# Patient Record
Sex: Male | Born: 1960 | Race: White | Hispanic: No | Marital: Single | Smoking: Never smoker
Health system: Southern US, Community
[De-identification: ages and names within clinical notes are randomized; demographics above are authoritative.]

## PROBLEM LIST (undated history)

## (undated) DIAGNOSIS — H269 Unspecified cataract: Secondary | ICD-10-CM

## (undated) DIAGNOSIS — F209 Schizophrenia, unspecified: Secondary | ICD-10-CM

## (undated) DIAGNOSIS — K760 Fatty (change of) liver, not elsewhere classified: Secondary | ICD-10-CM

## (undated) DIAGNOSIS — D518 Other vitamin B12 deficiency anemias: Secondary | ICD-10-CM

## (undated) DIAGNOSIS — E785 Hyperlipidemia, unspecified: Secondary | ICD-10-CM

## (undated) DIAGNOSIS — I10 Essential (primary) hypertension: Secondary | ICD-10-CM

## (undated) DIAGNOSIS — G20 Parkinson's disease: Secondary | ICD-10-CM

## (undated) DIAGNOSIS — K635 Polyp of colon: Secondary | ICD-10-CM

## (undated) DIAGNOSIS — G20A1 Parkinson's disease without dyskinesia, without mention of fluctuations: Secondary | ICD-10-CM

## (undated) HISTORY — DX: Hyperlipidemia, unspecified: E78.5

## (undated) HISTORY — DX: Schizophrenia, unspecified: F20.9

## (undated) HISTORY — DX: Fatty (change of) liver, not elsewhere classified: K76.0

## (undated) HISTORY — DX: Essential (primary) hypertension: I10

## (undated) HISTORY — PX: UPPER GASTROINTESTINAL ENDOSCOPY: SHX188

## (undated) HISTORY — DX: Parkinson's disease: G20

## (undated) HISTORY — DX: Polyp of colon: K63.5

## (undated) HISTORY — DX: Other vitamin B12 deficiency anemias: D51.8

## (undated) HISTORY — DX: Parkinson's disease without dyskinesia, without mention of fluctuations: G20.A1

## (undated) HISTORY — DX: Unspecified cataract: H26.9

---
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
## 2000-12-22 ENCOUNTER — Encounter: Payer: Self-pay | Admitting: Family Medicine

## 2000-12-22 ENCOUNTER — Ambulatory Visit (HOSPITAL_COMMUNITY): Admission: RE | Admit: 2000-12-22 | Discharge: 2000-12-22 | Payer: Self-pay | Admitting: Family Medicine

## 2008-01-21 LAB — HM COLONOSCOPY

## 2008-02-13 HISTORY — PX: COLONOSCOPY: SHX174

## 2009-05-16 LAB — FECAL OCCULT BLOOD, GUAIAC: Fecal Occult Blood: NEGATIVE

## 2010-05-04 LAB — CBC AND DIFFERENTIAL: Hemoglobin: 16 g/dL (ref 13.5–17.5)

## 2010-06-16 ENCOUNTER — Ambulatory Visit (INDEPENDENT_AMBULATORY_CARE_PROVIDER_SITE_OTHER): Payer: No Typology Code available for payment source | Admitting: Internal Medicine

## 2010-06-16 DIAGNOSIS — R1310 Dysphagia, unspecified: Secondary | ICD-10-CM

## 2010-06-28 NOTE — Consult Note (Signed)
NAME:  Mark Benjamin, Mark Benjamin                 ACCOUNT NO.:  [id number]  MEDICAL RECORD NO.:  [id number]           PATIENT TYPE:  LOCATION:  Office                              FACILITY:  PHYSICIAN:  Lionel December, M.D.    DATE OF BIRTH:  06/02/1960  DATE OF CONSULTATION: DATE OF DISCHARGE:                                CONSULTATION   REASON FOR CONSULTATION:  Dysphagia.  HISTORY OF PRESENT ILLNESS:  Mark Benjamin is a 50 year old male referred to our office by Dr. Christell Constant for dysphagia.  Mark Benjamin states foods are lodging.  Mark Benjamin has to drink water for the bolus to go down.  Mark Benjamin says Mark Benjamin has symptoms 2-3 times a month, which has been occurring for about a year.  Mark Benjamin may in particular will lodge.  Mark Benjamin does say Mark Benjamin does better when Mark Benjamin eats small bites of food and chews well.  His appetite has remained good.  There has been no weight loss.  No abdominal pain. Mark Benjamin usually has one bowel movement a day and sometimes Mark Benjamin will skip a day.  His stools are brown and normal caliber.  Mark Benjamin denies any acid reflux.  Mark Benjamin has no trouble swallowing pills.  Mark Benjamin did undergo EGD/ED in 2004 by Dr. Kinnie Scales in [place] for dysphagia.  His last colonoscopy was in 2009 by Dr. Loreta Ave, and the polyp was removed.  Mark Benjamin was advised by Dr. Karilyn Cota to have a followup colonoscopy in 2014.  I did not receive the pathology report from [facility].  There are no known allergies.  SURGERY:  Mark Benjamin had questionable urological procedure as a child.  MEDICAL HISTORY:  Diabetes since 2006.  Mark Benjamin is a schizophrenic, and a history of hypertension.  FAMILY HISTORY:  His mother is alive in good health.  His father is deceased from ALF.  One sister in good health.  Mark Benjamin has one brother deceased, unknown reasons why Mark Benjamin deceased at age 69.  Mark Benjamin is single.  Mark Benjamin is disabled.  Mark Benjamin does not smoke, drink, or do drugs, and Mark Benjamin does not have any children.  HOME MEDICATIONS: 1. Thioridazine 100 mg 3 at night. 2. Crestor 20 mg a day. 3. Niaspan 1000 mg a day. 4.  Actos 15 mg 1/2 tablet daily. 5. Fosinopril/hydrochlorothiazide 10/12.5 a day. 6. Lovaza 1 g cap twice a day. 7. Vitamin D 1000 units a day. 8. Colace 1 a day.  OBJECTIVE:  VITAL SIGNS:  His weight is 223.7, his height is 6 feet 1 inches, temperature is 97, blood pressure is 122/70, his pulse is 84. HEENT:  Mark Benjamin has natural teeth in good condition.  His oral mucosa is moist.  There are no lesions.  His conjunctivae are pink.  His sclerae are anicteric. NECK:  His thyroid is normal.  There is no cervical lymphadenopathy. LUNGS:  Clear. ABDOMEN:  Soft.  Bowel sounds are positive.  No masses. EXTREMITIES:  There is no edema to his extremities.  ASSESSMENT:  Mark Benjamin is a 50 year old male presenting today with solid food dysphagia, which has been occurring for about a year.  Mark Benjamin does have a history of solid food dysphagia  and has undergone an EGD/ED in  2004. An esophageal stricturing or web needs to be ruled out.  RECOMMENDATIONS:  We will schedule an EGD/ED with Dr. Karilyn Cota in the near future, and the risk and benefits were reviewed with the patient, and Mark Benjamin is agreeable to proceed.    ______________________________ Dorene Ar, NP   ______________________________ Lionel December, M.D.    TS/MEDQ  D:  06/16/2010  T:  06/16/2010  Job:  [id number]  cc:   Ernestina Penna, M.D. Fax: [phone]  Electronically Signed by Dorene Ar PA on 06/25/2010 12:17:47 PM Electronically Signed by Lionel December M.D. on 06/28/2010 01:59:19 PM

## 2010-06-29 ENCOUNTER — Encounter: Payer: Self-pay | Admitting: Family Medicine

## 2010-07-27 ENCOUNTER — Encounter (INDEPENDENT_AMBULATORY_CARE_PROVIDER_SITE_OTHER): Payer: No Typology Code available for payment source | Admitting: Internal Medicine

## 2010-07-27 ENCOUNTER — Ambulatory Visit (HOSPITAL_COMMUNITY)
Admission: RE | Admit: 2010-07-27 | Payer: No Typology Code available for payment source | Source: Ambulatory Visit | Admitting: Internal Medicine

## 2010-09-09 ENCOUNTER — Encounter (HOSPITAL_BASED_OUTPATIENT_CLINIC_OR_DEPARTMENT_OTHER): Payer: No Typology Code available for payment source | Admitting: Internal Medicine

## 2010-09-09 ENCOUNTER — Other Ambulatory Visit (INDEPENDENT_AMBULATORY_CARE_PROVIDER_SITE_OTHER): Payer: Self-pay | Admitting: Internal Medicine

## 2010-09-09 ENCOUNTER — Ambulatory Visit (HOSPITAL_COMMUNITY)
Admission: RE | Admit: 2010-09-09 | Discharge: 2010-09-09 | Disposition: A | Discharge status: Home / Self Care | Payer: No Typology Code available for payment source | Source: Ambulatory Visit | Attending: Internal Medicine | Admitting: Internal Medicine

## 2010-09-09 DIAGNOSIS — R1310 Dysphagia, unspecified: Secondary | ICD-10-CM | POA: Insufficient documentation

## 2010-09-09 DIAGNOSIS — K319 Disease of stomach and duodenum, unspecified: Secondary | ICD-10-CM

## 2010-09-09 DIAGNOSIS — D130 Benign neoplasm of esophagus: Secondary | ICD-10-CM | POA: Insufficient documentation

## 2010-09-09 DIAGNOSIS — E785 Hyperlipidemia, unspecified: Secondary | ICD-10-CM | POA: Insufficient documentation

## 2010-09-09 DIAGNOSIS — E119 Type 2 diabetes mellitus without complications: Secondary | ICD-10-CM | POA: Insufficient documentation

## 2010-09-09 DIAGNOSIS — K200 Eosinophilic esophagitis: Secondary | ICD-10-CM | POA: Insufficient documentation

## 2010-09-09 DIAGNOSIS — D131 Benign neoplasm of stomach: Secondary | ICD-10-CM

## 2010-09-09 LAB — GLUCOSE, CAPILLARY: Glucose-Capillary: 120 mg/dL — ABNORMAL HIGH (ref 70–99)

## 2010-09-10 LAB — BASIC METABOLIC PANEL
Potassium: 4.5 mmol/L (ref 3.4–5.3)
Sodium: 140 mmol/L (ref 137–147)

## 2010-09-10 LAB — HEPATIC FUNCTION PANEL
AST: 25 U/L (ref 14–40)
Bilirubin, Total: 0.9 mg/dL

## 2010-09-29 NOTE — Op Note (Signed)
NAME:  Mark Benjamin, Mark Benjamin                 ACCOUNT NO.:  [id number]  MEDICAL RECORD NO.:  [id number]  LOCATION:  [facility]                          FACILITY:  [facility]  PHYSICIAN:  Lionel December, M.D.    DATE OF BIRTH:  December 02, 1960  DATE OF PROCEDURE: DATE OF DISCHARGE:                              OPERATIVE REPORT   PROCEDURE:  Esophagogastroduodenoscopy with esophageal dilation.  INDICATION:  Mark Benjamin is 50 year old Caucasian male who presents with 1- year history of dysphagia to solids.  He points to midsternal area site of bolus obstruction.  He states he had esophagus dilated in [place] about 8 years ago, but does not remember the details of.  However, he did not have any problem until 1 year ago.  He denies heartburn.  He also denies odynophagia, melena, abdominal pain, anorexia, weight loss. Procedures were reviewed with the patient.  Informed consent was obtained.  MEDS FOR CONSCIOUS SEDATION:  Cetacaine spray for oropharyngeal topicalanesthesia, Demerol 50 mg IV, Versed 8 mg IV.  FINDINGS:  Procedure performed in endoscopy suite.  The patient's vital signs and O2 sat were monitored during the procedure and remained stable.  The patient was placed in left lateral recumbent position and Pentax videoscope was passed via oropharynx without any difficulty into esophagus.  On the way in, oropharyngeal mucosa was noted to be quite dry.  Esophagus.  Esophageal mucosa revealed few small plaques and linear furrows and a coarse appearance to mucosa and body.  These changes were very suspicious for eosinophilic esophagitis.  GE junction was located at 40 cm from the incisors and there was no obvious narrowing ring or stricture.  This area was dilated on the way out with the balloon dilator from 15 mm to 16.5 and 18 mm, but no mucosal disruption was induced.  On the way out, esophageal biopsy was taken looking for eosinophilic esophagitis.  Stomach.  It was empty and distended very well by  insufflation.  Folds of proximal stomach are normal.  Examination of mucosa revealed few punctate telangiectasia antrum without stigmata of bleeding.  Pyloric channel was patent.  Angularis, fundus, and cardia were examined by retroflexion of scope and were normal except 4-mm hyperplastic-appearing polyp at fundus which was left alone.  Duodenum.  Bulbar and postbulbar mucosa was normal.  GE junction was dilated with a balloon as described above.  Multiple biopsies were taken for esophageal body before the endoscope was withdrawn.  The patient tolerated the procedure well.  FINAL DIAGNOSES: 1. Esophageal mucosal changes suspicious for eosinophilic esophagitis.     No obvious stricture or ring noted.  GE junction and distal     esophagus dilated with balloon up to 18 mm. 2. A few punctate telangiectasia at gastric antrum and 4-mm     hyperplastic-appearing polyp at gastric fundus, which was left     alone.  Esophageal biopsy was taken on the way out.  RECOMMENDATIONS:  The patient will resume his usual meds and diet.  He must chew his food thoroughly before he tend to swallow it.  I will be contacting patient and/or his mother with the biopsy results and further recommendations.  ______________________________ Lionel December, M.D.     NR/MEDQ  D:  09/09/2010  T:  09/10/2010  Job:  [id number]  cc:   Ernestina Penna, M.D. Fax: [phone]  Electronically Signed by Lionel December M.D. on 09/29/2010 08:14:55 PM

## 2010-11-12 ENCOUNTER — Encounter (INDEPENDENT_AMBULATORY_CARE_PROVIDER_SITE_OTHER): Payer: Self-pay

## 2010-11-30 ENCOUNTER — Ambulatory Visit (INDEPENDENT_AMBULATORY_CARE_PROVIDER_SITE_OTHER): Payer: No Typology Code available for payment source | Admitting: Internal Medicine

## 2010-11-30 VITALS — BP 112/70 | HR 80 | Temp 97.0°F | Ht 73.0 in | Wt 220.4 lb

## 2010-11-30 DIAGNOSIS — R1310 Dysphagia, unspecified: Secondary | ICD-10-CM

## 2010-11-30 DIAGNOSIS — R1314 Dysphagia, pharyngoesophageal phase: Secondary | ICD-10-CM

## 2010-11-30 NOTE — Progress Notes (Signed)
Subjective:     Patient ID: Mark Benjamin, male   DOB: 07/05/1960, 50 y.o.   MRN: [id number]  HPI Mark Benjamin is here for f/u.  He says his swallowing is okay.  No dysphagia.  He is eating what he wants to  His appetite is good.  He does tell me sometimes foods are slow to go down.  No acid reflux. BMs are normal.   He denies weight loss.  He can eat hamburger without difficulty He was last seen in our office in March with c/o of dysphagia. He underwent an EGD in June which revealed eosinophilic esophagitis.   He did not get the Fluticasone filled due to the expense.He did not let anyone know this. Review of Systems Current Outpatient Prescriptions  Medication Sig Dispense Refill  . Cholecalciferol (VITAMIN D3) 1000 UNITS CAPS Take 1 tablet by mouth daily.        . docusate sodium (COLACE) 100 MG capsule Take 100 mg by mouth 2 (two) times daily.        . fosinopril-hydrochlorothiazide (MONOPRIL-HCT) 10-12.5 MG per tablet Take 1 tablet by mouth daily.        . geriatric multivitamins-minerals (ELDERTONIC/GEVRABON) ELIX Take 15 mLs by mouth daily.        . niacin (NIASPAN) 1000 MG CR tablet Take 1,000 mg by mouth at bedtime.        . omega-3 acid ethyl esters (LOVAZA) 1 G capsule Take 2 g by mouth 2 (two) times daily.        . Omega-3 Fatty Acids (FISH OIL) 1200 MG CAPS Take 3 capsules by mouth daily.        . pioglitazone (ACTOS) 30 MG tablet Take 30 mg by mouth daily.        . rosuvastatin (CRESTOR) 20 MG tablet Take 20 mg by mouth daily.        . thioridazine (MELLARIL) 100 MG tablet Take 100 mg by mouth. 3 tabs qhs            Past Surgical History  Procedure Date  . Upper gastrointestinal endoscopy EGD ED    09/09/2010  . Colonoscopy 02/13/08   Past Medical History  Diagnosis Date  . Essential hypertension, benign   . Other and unspecified hyperlipidemia   . 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 Schizophrenia   . Hyperplasia of prostate   . Fatty liver     . Colon polyps   . Dysphagia    History   Social History Narrative  . No narrative on file   History   Social History  . Marital Status: Married    Spouse Name: N/A    Number of Children: N/A  . Years of Education: N/A   Occupational History  . Not on file.   Social History Main Topics  . Smoking status: Not on file  . Smokeless tobacco: Not on file  . Alcohol Use:   . Drug Use:   . Sexually Active:    Other Topics Concern  . Not on file   Social History Narrative  . No narrative on file  No family history on file. No family status information on file.     Objective:   Physical Exam    Filed Vitals:   11/30/10 1027  BP: 112/70  Pulse: 80  Temp: 97 F (36.1 C)   Filed Vitals:   11/30/10 1027  Height: 6' 1" (1.854 m)  Weight: 220 lb   6.4 oz (99.973 kg)    Alert and oriented. Skin warm and dry. Oral mucosa is moist. Natural teeth in good condition. Sclera anicteric, conjunctivae is pink. Thyroid not enlarged. No cervical lymphadenopathy. Lungs clear. Heart regular rate and rhythm.  Abdomen is soft. Bowel sounds are positive. No hepatomegaly. No abdominal masses felt. No tenderness.  No edema to lower extremities. Patient is alert and oriented.      Assessment:    Eosinophilic esophagitis     Plan:     I will discuss with Dr. Rehman other treatment options since he cannot afford this medications.  He will follow up in 6 monthhs     

## 2010-11-30 NOTE — Progress Notes (Signed)
Subjective:     Patient ID: Mark Benjamin, male   DOB: 10/02/1960, 50 y.o.   MRN: [id number]  HPI Mark Benjamin is here for f/u.  He says his swallowing is okay.  No dysphagia.  He is eating what he wants to  His appetite is good.  He does tell me sometimes foods are slow to go down.  No acid reflux. BMs are normal.   He denies weight loss.  He can eat hamburger without difficulty He was last seen in our office in March with c/o of dysphagia. He underwent an EGD in June which revealed eosinophilic esophagitis.   He did not get the Fluticasone filled due to the expense.He did not let anyone know this. Review of Systems Current Outpatient Prescriptions  Medication Sig Dispense Refill  . Cholecalciferol (VITAMIN D3) 1000 UNITS CAPS Take 1 tablet by mouth daily.        . docusate sodium (COLACE) 100 MG capsule Take 100 mg by mouth 2 (two) times daily.        . fosinopril-hydrochlorothiazide (MONOPRIL-HCT) 10-12.5 MG per tablet Take 1 tablet by mouth daily.        . geriatric multivitamins-minerals (ELDERTONIC/GEVRABON) ELIX Take 15 mLs by mouth daily.        . niacin (NIASPAN) 1000 MG CR tablet Take 1,000 mg by mouth at bedtime.        . omega-3 acid ethyl esters (LOVAZA) 1 G capsule Take 2 g by mouth 2 (two) times daily.        . Omega-3 Fatty Acids (FISH OIL) 1200 MG CAPS Take 3 capsules by mouth daily.        . pioglitazone (ACTOS) 30 MG tablet Take 30 mg by mouth daily.        . rosuvastatin (CRESTOR) 20 MG tablet Take 20 mg by mouth daily.        . thioridazine (MELLARIL) 100 MG tablet Take 100 mg by mouth. 3 tabs qhs            Past Surgical History  Procedure Date  . Upper gastrointestinal endoscopy EGD ED    09/09/2010  . Colonoscopy 02/13/08   Past Medical History  Diagnosis Date  . Essential hypertension, benign   . Other and unspecified hyperlipidemia   . 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 Schizophrenia   . Hyperplasia of prostate   . Fatty liver     . Colon polyps   . Dysphagia    History   Social History Narrative  . No narrative on file   History   Social History  . Marital Status: Married    Spouse Name: N/A    Number of Children: N/A  . Years of Education: N/A   Occupational History  . Not on file.   Social History Main Topics  . Smoking status: Not on file  . Smokeless tobacco: Not on file  . Alcohol Use:   . Drug Use:   . Sexually Active:    Other Topics Concern  . Not on file   Social History Narrative  . No narrative on file  No family history on file. No family status information on file.     Objective:   Physical Exam    Filed Vitals:   11/30/10 1027  BP: 112/70  Pulse: 80  Temp: 97 F (36.1 C)   Filed Vitals:   11/30/10 1027  Height: 6' 1" (1.854 m)  Weight: 220 lb   6.4 oz (99.973 kg)    Alert and oriented. Skin warm and dry. Oral mucosa is moist. Natural teeth in good condition. Sclera anicteric, conjunctivae is pink. Thyroid not enlarged. No cervical lymphadenopathy. Lungs clear. Heart regular rate and rhythm.  Abdomen is soft. Bowel sounds are positive. No hepatomegaly. No abdominal masses felt. No tenderness.  No edema to lower extremities. Patient is alert and oriented.      Assessment:    Eosinophilic esophagitis     Plan:     I will discuss with Dr. Rehman other treatment options since he cannot afford this medications.  He will follow up in 6 monthhs     

## 2010-11-30 NOTE — Progress Notes (Signed)
Subjective:     Patient ID: Mark Benjamin, male   DOB: 08-11-1960, 50 y.o.   MRN: [id number]  HPI Mark Benjamin is here for f/u.  He says his swallowing is okay.  No dysphagia.  He is eating what he wants to  His appetite is good.  He does tell me sometimes foods are slow to go down.  No acid reflux. BMs are normal.   He denies weight loss.  He can eat hamburger without difficulty He was last seen in our office in March with c/o of dysphagia. He underwent an EGD in June which revealed eosinophilic esophagitis.   He did not get the Fluticasone filled due to the expense.He did not let anyone know this. Review of Systems Current Outpatient Prescriptions  Medication Sig Dispense Refill  . Cholecalciferol (VITAMIN D3) 1000 UNITS CAPS Take 1 tablet by mouth daily.        Marland Kitchen docusate sodium (COLACE) 100 MG capsule Take 100 mg by mouth 2 (two) times daily.        . fosinopril-hydrochlorothiazide (MONOPRIL-HCT) 10-12.5 MG per tablet Take 1 tablet by mouth daily.        Marland Kitchen geriatric multivitamins-minerals (ELDERTONIC/GEVRABON) ELIX Take 15 mLs by mouth daily.        . niacin (NIASPAN) 1000 MG CR tablet Take 1,000 mg by mouth at bedtime.        Marland Kitchen omega-3 acid ethyl esters (LOVAZA) 1 G capsule Take 2 g by mouth 2 (two) times daily.        . Omega-3 Fatty Acids (FISH OIL) 1200 MG CAPS Take 3 capsules by mouth daily.        . pioglitazone (ACTOS) 30 MG tablet Take 30 mg by mouth daily.        . rosuvastatin (CRESTOR) 20 MG tablet Take 20 mg by mouth daily.        Marland Kitchen thioridazine (MELLARIL) 100 MG tablet Take 100 mg by mouth. 3 tabs qhs            Past Surgical History  Procedure Date  . Upper gastrointestinal endoscopy EGD ED    09/09/2010  . Colonoscopy 02/13/08   Past Medical History  Diagnosis Date  . Essential hypertension, benign   . Other and unspecified hyperlipidemia   . 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 Schizophrenia   . Hyperplasia of prostate   . Fatty liver     . Colon polyps   . Dysphagia    History   Social History Narrative  . No narrative on file   History   Social History  . Marital Status: Married    Spouse Name: N/A    Number of Children: N/A  . Years of Education: N/A   Occupational History  . Not on file.   Social History Main Topics  . Smoking status: Not on file  . Smokeless tobacco: Not on file  . Alcohol Use:   . Drug Use:   . Sexually Active:    Other Topics Concern  . Not on file   Social History Narrative  . No narrative on file  No family history on file. No family status information on file.     Objective:   Physical Exam    Filed Vitals:   11/30/10 1027  BP: 112/70  Pulse: 80  Temp: 97 F (36.1 C)   Filed Vitals:   11/30/10 1027  Height: 6\' 1"  (1.854 m)  Weight: 220 lb  6.4 oz (99.973 kg)    Alert and oriented. Skin warm and dry. Oral mucosa is moist. Natural teeth in good condition. Sclera anicteric, conjunctivae is pink. Thyroid not enlarged. No cervical lymphadenopathy. Lungs clear. Heart regular rate and rhythm.  Abdomen is soft. Bowel sounds are positive. No hepatomegaly. No abdominal masses felt. No tenderness.  No edema to lower extremities. Patient is alert and oriented.      Assessment:    Eosinophilic esophagitis     Plan:     I will discuss with Dr. Karilyn Cota other treatment options since he cannot afford this medications.  He will follow up in 6 monthhs

## 2010-11-30 NOTE — Patient Instructions (Signed)
I will discuss this case with Dr. Rehman 

## 2010-12-10 ENCOUNTER — Telehealth (INDEPENDENT_AMBULATORY_CARE_PROVIDER_SITE_OTHER): Payer: Self-pay | Admitting: *Deleted

## 2010-12-10 NOTE — Telephone Encounter (Signed)
Left mess on machine, has questions about meds, wants someone to call him

## 2010-12-14 ENCOUNTER — Telehealth (INDEPENDENT_AMBULATORY_CARE_PROVIDER_SITE_OTHER): Payer: Self-pay | Admitting: *Deleted

## 2010-12-14 NOTE — Telephone Encounter (Signed)
Patient will be home the rest of today.  He is returning your call.

## 2010-12-14 NOTE — Telephone Encounter (Signed)
I spoke with mother and he will return my call once he is finished mowing the yard. Mark Benjamin

## 2010-12-15 NOTE — Telephone Encounter (Signed)
I will talk with Dr. Karilyn Cota about the Fluticisone for this patient.

## 2011-05-14 ENCOUNTER — Encounter (INDEPENDENT_AMBULATORY_CARE_PROVIDER_SITE_OTHER): Payer: Self-pay | Admitting: *Deleted

## 2011-05-27 ENCOUNTER — Ambulatory Visit (INDEPENDENT_AMBULATORY_CARE_PROVIDER_SITE_OTHER): Payer: No Typology Code available for payment source | Admitting: Internal Medicine

## 2011-06-22 ENCOUNTER — Encounter (INDEPENDENT_AMBULATORY_CARE_PROVIDER_SITE_OTHER): Payer: Self-pay | Admitting: Internal Medicine

## 2011-06-22 ENCOUNTER — Ambulatory Visit (INDEPENDENT_AMBULATORY_CARE_PROVIDER_SITE_OTHER): Payer: No Typology Code available for payment source | Admitting: Internal Medicine

## 2011-06-22 DIAGNOSIS — K200 Eosinophilic esophagitis: Secondary | ICD-10-CM

## 2011-06-22 DIAGNOSIS — R1310 Dysphagia, unspecified: Secondary | ICD-10-CM

## 2011-06-22 NOTE — Patient Instructions (Signed)
Follow up on prn basis.

## 2011-06-22 NOTE — Progress Notes (Signed)
Subjective:     Patient ID: Mark Benjamin, male   DOB: 09/27/1960, 51 y.o.   MRN: [id number]  HPIEdward is a 51yr old male here today for f/u.   He underwent and EGD/ED in June of last year for dysphagia. He tells me he is doing good.  In the morning, he sometimes becomes nauseated. There is no dysphagia. No acid reflux. Appetite is good. No weight loss.  No abdominal pain. He usually has a BM  X 1 a day.    09/09/10 EGD/ED Dr. Karilyn Cota: dysphagia:FINAL DIAGNOSES:  1. Esophageal mucosal changes suspicious for eosinophilic esophagitis.  No obvious stricture or ring noted. GE junction and distal  esophagus dilated with balloon up to 18 mm.  2. A few punctate telangiectasia at gastric antrum and 4-mm  hyperplastic-appearing polyp at gastric fundus, which was left  alone. Esophageal biopsy was taken on the way out. Biopsy consistent with eosinophilic esophagitis  Review of Systems see hpi Current Outpatient Prescriptions  Medication Sig Dispense Refill  . Cholecalciferol (VITAMIN D3) 1000 UNITS CAPS Take 1 tablet by mouth daily.        Marland Kitchen docusate sodium (COLACE) 100 MG capsule Take 100 mg by mouth 2 (two) times daily.        . fosinopril-hydrochlorothiazide (MONOPRIL-HCT) 10-12.5 MG per tablet Take 1 tablet by mouth daily.        Marland Kitchen geriatric multivitamins-minerals (ELDERTONIC/GEVRABON) ELIX Take 15 mLs by mouth daily.        . niacin (NIASPAN) 1000 MG CR tablet Take 1,000 mg by mouth at bedtime.        Marland Kitchen omega-3 acid ethyl esters (LOVAZA) 1 G capsule Take 2 g by mouth 2 (two) times daily.        . pioglitazone (ACTOS) 30 MG tablet Take 30 mg by mouth daily.        . rosuvastatin (CRESTOR) 20 MG tablet Take 20 mg by mouth daily.        Marland Kitchen thioridazine (MELLARIL) 100 MG tablet Take 100 mg by mouth. 3 tabs qhs       . Omega-3 Fatty Acids (FISH OIL) 1200 MG CAPS Take 3 capsules by mouth daily.         Past Medical History  Diagnosis Date  . Essential hypertension, benign   . Other and  unspecified hyperlipidemia   . 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 Schizophrenia   . Hyperplasia of prostate   . Fatty liver   . Colon polyps   . Dysphagia    Past Surgical History  Procedure Date  . Upper gastrointestinal endoscopy EGD ED    09/09/2010  . Colonoscopy 02/13/08   History   Social History  . Marital Status: Married    Spouse Name: N/A    Number of Children: N/A  . Years of Education: N/A   Occupational History  . Not on file.   Social History Main Topics  . Smoking status: Never Smoker   . Smokeless tobacco: Not on file  . Alcohol Use: No  . Drug Use: No  . Sexually Active: Not on file   Other Topics Concern  . Not on file   Social History Narrative  . No narrative on file   Family Status  Relation Status Death Age  . Mother Alive     walks with a walker  . Father Deceased     ALS  . Sister Alive     good health  .  Brother Deceased     unknown   Allergies  Allergen Reactions  . Erythromycin   . Zocor (Simvastatin)         Objective:   Physical Exam Filed Vitals:   06/22/11 1053  Height: 6\' 1"  (1.854 m)  Weight: 222 lb 6.4 oz (100.88 kg)  Alert and oriented. Skin warm and dry. Oral mucosa is moist.   . Sclera anicteric, conjunctivae is pink. Thyroid not enlarged. No cervical lymphadenopathy. Lungs clear. Heart regular rate and rhythm.  Abdomen is soft. Bowel sounds are positive. No hepatomegaly. No abdominal masses felt. No tenderness.  No edema to lower extremities. Patient is alert and oriented.      Assessment:    Dysphagia which has now resolved since undergoing the EGD. No GI problems at this time.    Plan:   May follow up on a prn basis.

## 2012-06-22 ENCOUNTER — Ambulatory Visit (INDEPENDENT_AMBULATORY_CARE_PROVIDER_SITE_OTHER): Payer: No Typology Code available for payment source | Admitting: Family Medicine

## 2012-06-22 ENCOUNTER — Ambulatory Visit (INDEPENDENT_AMBULATORY_CARE_PROVIDER_SITE_OTHER): Payer: No Typology Code available for payment source

## 2012-06-22 ENCOUNTER — Encounter: Payer: Self-pay | Admitting: Family Medicine

## 2012-06-22 VITALS — BP 114/69 | HR 88 | Temp 98.3°F | Ht 72.0 in | Wt 189.0 lb

## 2012-06-22 DIAGNOSIS — N400 Enlarged prostate without lower urinary tract symptoms: Secondary | ICD-10-CM

## 2012-06-22 DIAGNOSIS — E559 Vitamin D deficiency, unspecified: Secondary | ICD-10-CM

## 2012-06-22 DIAGNOSIS — Z125 Encounter for screening for malignant neoplasm of prostate: Secondary | ICD-10-CM

## 2012-06-22 DIAGNOSIS — E785 Hyperlipidemia, unspecified: Secondary | ICD-10-CM

## 2012-06-22 DIAGNOSIS — I10 Essential (primary) hypertension: Secondary | ICD-10-CM

## 2012-06-22 DIAGNOSIS — I7090 Unspecified atherosclerosis: Secondary | ICD-10-CM

## 2012-06-22 DIAGNOSIS — F209 Schizophrenia, unspecified: Secondary | ICD-10-CM | POA: Insufficient documentation

## 2012-06-22 DIAGNOSIS — R29898 Other symptoms and signs involving the musculoskeletal system: Secondary | ICD-10-CM

## 2012-06-22 DIAGNOSIS — M6281 Muscle weakness (generalized): Secondary | ICD-10-CM

## 2012-06-22 DIAGNOSIS — K6289 Other specified diseases of anus and rectum: Secondary | ICD-10-CM

## 2012-06-22 DIAGNOSIS — R198 Other specified symptoms and signs involving the digestive system and abdomen: Secondary | ICD-10-CM

## 2012-06-22 DIAGNOSIS — R5381 Other malaise: Secondary | ICD-10-CM

## 2012-06-22 LAB — POCT CBC
Lymph, poc: 1.7 (ref 0.6–3.4)
MCH, POC: 33.8 pg — AB (ref 27–31.2)
MCHC: 36.6 g/dL — AB (ref 31.8–35.4)
MCV: 92.4 fL (ref 80–97)
POC LYMPH PERCENT: 34.9 %L (ref 10–50)
Platelet Count, POC: 165 10*3/uL (ref 142–424)
RBC: 4.3 M/uL — AB (ref 4.69–6.13)
RDW, POC: 11.8 %
WBC: 4.8 10*3/uL (ref 4.6–10.2)

## 2012-06-22 LAB — BASIC METABOLIC PANEL WITH GFR
BUN: 7 mg/dL (ref 6–23)
Chloride: 102 mEq/L (ref 96–112)
GFR, Est African American: >89 mL/min
Glucose, Bld: 98 mg/dL (ref 70–99)
Potassium: 3.6 mEq/L (ref 3.5–5.3)

## 2012-06-22 LAB — HEPATIC FUNCTION PANEL
ALT: 25 U/L (ref 0–53)
AST: 24 U/L (ref 0–37)
Bilirubin, Direct: 0.2 mg/dL (ref 0.0–0.3)
Total Protein: 6.7 g/dL (ref 6.0–8.3)

## 2012-06-22 NOTE — Progress Notes (Signed)
  Subjective:    Patient ID: Mark Benjamin, male    DOB: 1961-01-26, 52 y.o.   MRN: [id number]  HPI    Review of Systems  Constitutional: Positive for fatigue (naps during the day).  HENT: Positive for congestion (slight), sneezing and postnasal drip. Negative for hearing loss, ear pain, nosebleeds, sore throat, trouble swallowing and sinus pressure.        Head congestion and drainage are worse at night  Eyes: Negative.        Eyes recently checked by ophthalmologist and glasses changed the 20th of this past month  Respiratory: Negative.  Negative for cough, shortness of breath and wheezing.   Cardiovascular: Negative.   Gastrointestinal: Positive for constipation (daily).       Occasionally C. blood with hard bowel movement  Genitourinary: Negative.   Musculoskeletal: Positive for back pain (occasional neck).       Reaching backward with left arm there is weakness  Neurological: Positive for weakness (L arm). Negative for dizziness, syncope and numbness.  Psychiatric/Behavioral: Positive for behavioral problems and sleep disturbance (trouble getting to sleep).       Schizphrenia, patient sees Dr. Geanie Cooley q 4 mo       Objective:   Physical Exam  Vitals reviewed. BP 114/69  Pulse 88  Temp(Src) 98.3 F (36.8 C) (Oral)  Ht 6' (1.829 m)  Wt 189 lb (85.73 kg)  BMI 25.63 kg/m2  The patient appeared well nourished and normally developed, alert and oriented to time and place. Speech, behavior and judgement appear normal.Flat affect as usual. Vital signs as documented.  Head exam is unremarkable. No scleral icterus or pallor noted. Bilateral ear cerumen. Neck is without jugular venous distension, thyromegally, or carotid bruits. Carotid upstrokes are brisk bilaterally. No cervical adenopathy. Lungs are clear anteriorly and posteriorly to auscultation. Normal respiratory effort. Cardiac exam reveals regular rate and rhythm @72 /min. First and second heart sounds normal. No murmurs, rubs  or gallops.  Abdominal exam reveals normal bowl sounds, no masses, no organomegaly and no aortic enlargement. No inguinal adenopathy.  Rectal exam revealed an enlarged prostate with no lumps. There was however a small quarter inched-sized lesion at about 3-4 inches. Felt warty in nature. There were no hernias and no testicular mass. Extremities are nonedematous and both femoral and pedal pulses are normal.Slight discomfort top of left shoulder with extension of neck. Skin without pallor or jaundice.  Warm and dry, without rash. Neurologic exam reveals normal deep tendon reflexes and normal sensation. Slightly diminished left brachial reflexes compared to the right. Good FA:OZHY pulses bilaterally. Good grip bilaterally  [facility] reading (PRIMARY) by  Dr. Rudi Heap: No significant abnormality apparent mild degenerative change                                 Assessment & Plan:  1. Essential hypertension, benign - BASIC METABOLIC PANEL WITH GFR; Standing  2. Other and unspecified hyperlipidemia - Hepatic function panel; Standing - NMR Lipoprofile with Lipids; Standing  3. Unspecified vitamin D deficiency   4. Other malaise and fatigue - POCT CBC; Standing - Vitamin D 25 hydroxy; Standing  5. Screening PSA (prostate specific antigen) - PSA; Standing  6. Left hand weakness - DG Cervical Spine Complete; Future  7. BPH (benign prostatic hyperplasia)  8. Rectal mass - Ambulatory referral to Gastroenterology  9. Schizophrenia Followed by mental health

## 2012-06-22 NOTE — Addendum Note (Signed)
Addended by: Roselyn Reef on: 06/22/2012 04:29 PM   Modules accepted: Orders

## 2012-06-22 NOTE — Patient Instructions (Addendum)
Continue current meds and therapeutic lifestyle changes We will arrange visit with Dr. Karilyn Cota rectal lesion palpated on exam Use Ayr nose spray for sinus Try Advil 200 mg over-the-counter one after each meal for the next 7-10 day  Regarding C-spine films today, if there is no finding in the weakness persists we may need to talk with the neurologist  Call you with the results of lab work when available

## 2012-06-23 LAB — NMR LIPOPROFILE WITH LIPIDS
HDL Size: 10.2 nm (ref >=9.2)
Large HDL-P: 9.6 umol/L (ref >=4.8)
Large VLDL-P: 1.9 nmol/L (ref <=2.7)
Small LDL Particle Number: 241 nmol/L (ref <=527)
Triglycerides: 67 mg/dL (ref <150)

## 2012-06-23 LAB — VITAMIN D 25 HYDROXY (VIT D DEFICIENCY, FRACTURES): Vit D, 25-Hydroxy: 53 ng/mL (ref 30–89)

## 2012-06-26 ENCOUNTER — Other Ambulatory Visit: Payer: Self-pay | Admitting: *Deleted

## 2012-06-26 MED ORDER — NIACIN ER (ANTIHYPERLIPIDEMIC) 1000 MG PO TBCR: 1000.0000 mg | EXTENDED_RELEASE_TABLET | Freq: Every day | ORAL | Status: DC

## 2012-06-28 ENCOUNTER — Ambulatory Visit (INDEPENDENT_AMBULATORY_CARE_PROVIDER_SITE_OTHER): Payer: No Typology Code available for payment source | Admitting: Internal Medicine

## 2012-06-28 ENCOUNTER — Telehealth (INDEPENDENT_AMBULATORY_CARE_PROVIDER_SITE_OTHER): Payer: Self-pay | Admitting: *Deleted

## 2012-06-28 ENCOUNTER — Other Ambulatory Visit (INDEPENDENT_AMBULATORY_CARE_PROVIDER_SITE_OTHER): Payer: Self-pay | Admitting: *Deleted

## 2012-06-28 ENCOUNTER — Encounter (INDEPENDENT_AMBULATORY_CARE_PROVIDER_SITE_OTHER): Payer: Self-pay | Admitting: Internal Medicine

## 2012-06-28 VITALS — BP 129/76 | HR 80 | Ht 73.0 in | Wt 189.2 lb

## 2012-06-28 DIAGNOSIS — K6289 Other specified diseases of anus and rectum: Secondary | ICD-10-CM

## 2012-06-28 DIAGNOSIS — Z86010 Personal history of colonic polyps: Secondary | ICD-10-CM

## 2012-06-28 DIAGNOSIS — D126 Benign neoplasm of colon, unspecified: Secondary | ICD-10-CM

## 2012-06-28 DIAGNOSIS — R198 Other specified symptoms and signs involving the digestive system and abdomen: Secondary | ICD-10-CM

## 2012-06-28 DIAGNOSIS — Z1211 Encounter for screening for malignant neoplasm of colon: Secondary | ICD-10-CM

## 2012-06-28 MED ORDER — PEG-KCL-NACL-NASULF-NA ASC-C 100 G PO SOLR: 1.0000 | Freq: Once | ORAL | Status: DC

## 2012-06-28 NOTE — Telephone Encounter (Signed)
Patient needs movi prep 

## 2012-06-28 NOTE — Progress Notes (Signed)
Subjective:     Patient ID: Mark Benjamin, male   DOB: Jul 15, 1960, 52 y.o.   MRN: [id number]  HPI Referred to our office by Dr. Christell Constant for possible rectal mass.  Saw Dr. Christell Constant last week and noted to have a rectal mass.  (Per Dr. Kathi Der notes:   Small quarter inched-sized lesion at about 3-4 inches in rectum. Felt warty in nature.)  He occasionally has constipation. His stools are normal size. He saw blood x 1 when he strained one month ago. Stools are dark brown.  He still has to strain on occasion to have a BM.  He has lost about 30 pounds since his last visit in April of last year. He states this weight loss was intentional. Appetite is good.  One uncle had colon cancer in his 70's or 58's  11./24/2009 Surveillance colonoscopy: Hx of colonic adenoma: Few small diverticula at the sigmoid colon. 3-4 mm polyp ablated via cold biopsy from the sigmoid colon. Small anal papillae. (No biopsy in chart).   CBC    Component Value Date/Time   WBC 4.8 06/22/2012 1602   RBC 4.3* 06/22/2012 1602   HGB 14.6 06/22/2012 1602   HGB 16.0 05/04/2010   HCT 39.9* 06/22/2012 1602   HCT 49 05/04/2010   PLT 242 05/04/2010   MCV 92.4 06/22/2012 1602   MCH 33.8* 06/22/2012 1602   MCHC 36.6* 06/22/2012 1602   Hepatic Function Panel     Component Value Date/Time   PROT 6.7 06/22/2012 1528   ALBUMIN 5.1 06/22/2012 1528   AST 24 06/22/2012 1528   ALT 25 06/22/2012 1528   ALKPHOS 56 06/22/2012 1528   BILITOT 0.9 06/22/2012 1528   BILIDIR 0.2 06/22/2012 1528   IBILI 0.7 06/22/2012 1528        Review of Systems see hpi Current Outpatient Prescriptions  Medication Sig Dispense Refill  . Cholecalciferol (VITAMIN D3) 1000 UNITS CAPS Take 1 tablet by mouth daily.        Marland Kitchen docusate sodium (COLACE) 100 MG capsule Take 100 mg by mouth as needed.       . fosinopril-hydrochlorothiazide (MONOPRIL-HCT) 10-12.5 MG per tablet Take 1 tablet by mouth daily.        . niacin (NIASPAN) 1000 MG CR tablet Take 1 tablet (1,000 mg total) by mouth at  bedtime.  30 tablet  0  . omega-3 acid ethyl esters (LOVAZA) 1 G capsule Take 2 g by mouth 2 (two) times daily.        . pioglitazone (ACTOS) 30 MG tablet Take 30 mg by mouth daily.        . rosuvastatin (CRESTOR) 20 MG tablet Take 20 mg by mouth daily.        Marland Kitchen thioridazine (MELLARIL) 100 MG tablet Take 100 mg by mouth. 3 tabs qhs        No current facility-administered medications for this visit.   Past Medical History  Diagnosis Date  . Essential hypertension, benign   . Other and unspecified hyperlipidemia   . 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 Schizophrenia   . Hyperplasia of prostate   . Fatty liver   . Colon polyps   . Dysphagia    Past Surgical History  Procedure Laterality Date  . Upper gastrointestinal endoscopy  EGD ED    09/09/2010  . Colonoscopy  02/13/08   Allergies  Allergen Reactions  . Erythromycin   . Zocor (Simvastatin)  Objective:   Physical Exam  Filed Vitals:   06/28/12 0909  BP: 129/76  Pulse: 80  Height: 6\' 1"  (1.854 m)  Weight: 189 lb 3.2 oz (85.821 kg)   Alert and oriented. Skin warm and dry. Oral mucosa is moist.   . Sclera anicteric, conjunctivae is pink. Thyroid not enlarged. No cervical lymphadenopathy. Lungs clear. Heart regular rate and rhythm.  Abdomen is soft. Bowel sounds are positive. No hepatomegaly. No abdominal masses felt. No tenderness.  No edema to lower extremities. No rectal mass felt. Guaiac negative.     Assessment:       Colonic adenoma. Last colonoscopy 2009.  Possible rectal mass per Dr. Kathi Der notes. No mass felt today. .  In need of surveillance colonoscopy. Colonic carcinoma needs to be ruled out.  Plan:    Colonoscopy with Dr. Karilyn Cota.

## 2012-06-28 NOTE — Patient Instructions (Signed)
Colonoscopy with Dr. Rehman. The risks and benefits such as perforation, bleeding, and infection were reviewed with the patient and is agreeable. 

## 2012-06-29 ENCOUNTER — Encounter (HOSPITAL_COMMUNITY): Payer: Self-pay | Admitting: Pharmacy Technician

## 2012-06-29 DIAGNOSIS — D126 Benign neoplasm of colon, unspecified: Secondary | ICD-10-CM | POA: Insufficient documentation

## 2012-06-29 DIAGNOSIS — K6289 Other specified diseases of anus and rectum: Secondary | ICD-10-CM | POA: Insufficient documentation

## 2012-07-10 ENCOUNTER — Encounter (HOSPITAL_COMMUNITY): Payer: Self-pay | Admitting: *Deleted

## 2012-07-10 ENCOUNTER — Ambulatory Visit (HOSPITAL_COMMUNITY)
Admission: RE | Admit: 2012-07-10 | Discharge: 2012-07-10 | Disposition: A | Discharge status: Home / Self Care | Payer: No Typology Code available for payment source | Source: Ambulatory Visit | Attending: Internal Medicine | Admitting: Internal Medicine

## 2012-07-10 ENCOUNTER — Encounter (HOSPITAL_COMMUNITY)
Admission: RE | Disposition: A | Discharge status: Home / Self Care | Payer: Self-pay | Source: Ambulatory Visit | Attending: Internal Medicine

## 2012-07-10 DIAGNOSIS — K6389 Other specified diseases of intestine: Secondary | ICD-10-CM

## 2012-07-10 DIAGNOSIS — Z860100 Personal history of colon polyps, unspecified: Secondary | ICD-10-CM | POA: Insufficient documentation

## 2012-07-10 DIAGNOSIS — D126 Benign neoplasm of colon, unspecified: Secondary | ICD-10-CM | POA: Insufficient documentation

## 2012-07-10 DIAGNOSIS — K921 Melena: Secondary | ICD-10-CM

## 2012-07-10 DIAGNOSIS — K5900 Constipation, unspecified: Secondary | ICD-10-CM

## 2012-07-10 DIAGNOSIS — K5730 Diverticulosis of large intestine without perforation or abscess without bleeding: Secondary | ICD-10-CM

## 2012-07-10 DIAGNOSIS — I10 Essential (primary) hypertension: Secondary | ICD-10-CM | POA: Insufficient documentation

## 2012-07-10 DIAGNOSIS — K644 Residual hemorrhoidal skin tags: Secondary | ICD-10-CM | POA: Insufficient documentation

## 2012-07-10 HISTORY — PX: COLONOSCOPY: SHX5424

## 2012-07-10 SURGERY — COLONOSCOPY
Anesthesia: Moderate Sedation

## 2012-07-10 MED ORDER — SIMETHICONE 40 MG/0.6ML PO SUSP
ORAL | Status: DC | PRN
  Administered 2012-07-10: 15:00:00

## 2012-07-10 MED ORDER — MIDAZOLAM HCL 5 MG/5ML IJ SOLN
INTRAMUSCULAR | Status: AC
  Filled 2012-07-10: qty 10

## 2012-07-10 MED ORDER — MEPERIDINE HCL 100 MG/ML IJ SOLN
INTRAMUSCULAR | Status: AC
  Filled 2012-07-10: qty 1

## 2012-07-10 MED ORDER — MIDAZOLAM HCL 5 MG/5ML IJ SOLN
INTRAMUSCULAR | Status: DC | PRN
  Administered 2012-07-10 (×3): 2 mg via INTRAVENOUS
  Administered 2012-07-10: 3 mg via INTRAVENOUS

## 2012-07-10 MED ORDER — SODIUM CHLORIDE 0.9 % IV SOLN
INTRAVENOUS | Status: DC
  Administered 2012-07-10: 1000 mL via INTRAVENOUS

## 2012-07-10 MED ORDER — MEPERIDINE HCL 50 MG/ML IJ SOLN
INTRAMUSCULAR | Status: DC | PRN
  Administered 2012-07-10 (×2): 25 mg via INTRAVENOUS

## 2012-07-10 NOTE — H&P (Signed)
Mark Benjamin is an 52 y.o. male.   Chief Complaint: Patient is here for colonoscopy. HPI: Patient is 52 year old Caucasian male who was found to have abnormal rectal exam by Dr. Rudi Benjamin. Small lesion was found on digital exam. Patient denies abdominal pain or rectal pain. He has occasionally noted hematochezia when he is constipated. His bowels move regularly with help of prune juice and Mark Benjamin. He has lost more than 20 pounds in one year voluntarily. His last colonoscopy was about 5 years ago at [facility] in [facility] with Mark Benjamin. Number is is negative for colorectal carcinoma.  Past Medical History  Diagnosis Date  . Essential hypertension, benign   . Other and unspecified hyperlipidemia   . 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 Schizophrenia   . Hyperplasia of prostate   . Fatty liver   . Colon polyps   . Dysphagia     Past Surgical History  Procedure Laterality Date  . Upper gastrointestinal endoscopy  EGD ED    09/09/2010  . Colonoscopy  02/13/08    History reviewed. No pertinent family history. Social History:  reports that he has never smoked. He does not have any smokeless tobacco history on file. He reports that  drinks alcohol. He reports that he does not use illicit drugs.  Allergies:  Allergies  Allergen Reactions  . Erythromycin   . Zocor (Simvastatin)     Medications Prior to Admission  Medication Sig Dispense Refill  . Cholecalciferol (VITAMIN D3) 1000 UNITS CAPS Take 3 tablets by mouth daily.       Marland Kitchen docusate sodium (Mark Benjamin) 100 MG capsule Take 100 mg by mouth as needed for constipation.       . fosinopril-hydrochlorothiazide (MONOPRIL-HCT) 10-12.5 MG per tablet Take 1 tablet by mouth daily.        . niacin (NIASPAN) 1000 MG CR tablet Take 1 tablet (1,000 mg total) by mouth at bedtime.  30 tablet  0  . omega-3 acid ethyl esters (LOVAZA) 1 G capsule Take 2 g by mouth 2 (two) times daily.        . pioglitazone  (ACTOS) 30 MG tablet Take 15 mg by mouth daily.       . rosuvastatin (CRESTOR) 20 MG tablet Take 20 mg by mouth daily.        Marland Kitchen thioridazine (MELLARIL) 100 MG tablet Take 300 mg by mouth at bedtime. 3 tabs qhs        No results found for this or any previous visit (from the past 48 hour(s)). No results found.  ROS  Blood pressure 152/72, pulse 87, temperature 98 F (36.7 C), resp. rate 18, height 6\' 1"  (1.854 m), weight 189 lb (85.73 kg), SpO2 97.00%. Physical Exam  Constitutional: He appears well-developed and well-nourished.  HENT:  Mouth/Throat: Oropharynx is clear and moist.  Eyes: Conjunctivae are normal. No scleral icterus.  Neck: No thyromegaly present.  Cardiovascular: Normal rate, regular rhythm and normal heart sounds.   Respiratory: Effort normal and breath sounds normal.  GI: Soft. He exhibits no distension and no mass. There is no tenderness.  Musculoskeletal: He exhibits no edema.  Lymphadenopathy:    He has no cervical adenopathy.  Neurological: He is alert.  Skin: Skin is warm and dry.     Assessment/Plan History of colonic Benjamin. Abnormal rectal exam. Diagnostic colonoscopy.  Mark Benjamin U 07/10/2012, 2:37 PM

## 2012-07-10 NOTE — Op Note (Signed)
COLONOSCOPY PROCEDURE REPORT  PATIENT:  Mark Benjamin  MR#:  [id number] Birthdate:  February 03, 1961, 52 y.o., male Endoscopist:  Dr. Malissa Hippo, MD Referred By:  Dr. Rudi Heap, MD. Procedure Date: 07/10/2012  Procedure:   Colonoscopy  Indications:  Patient is 52 year old Caucasian male who was noted to have abnormal exam by Dr. Rudi Heap. He gives history of occasional hematochezia when he  is constipated. His last colonoscopy was about 5 years ago with removal of single small polyp. Him history is negative for colorectal carcinoma.  Informed Consent:  The procedure and risks were reviewed with the patient and informed consent was obtained.  Medications:  Demerol 50 mg IV Versed 9 mg IV  Description of procedure:  After a digital rectal exam was performed, that colonoscope was advanced from the anus through the rectum and colon to the area of the cecum, ileocecal valve and appendiceal orifice. The cecum was deeply intubated. These structures were well-seen and photographed for the record. From the level of the cecum and ileocecal valve, the scope was slowly and cautiously withdrawn. The mucosal surfaces were carefully surveyed utilizing scope tip to flexion to facilitate fold flattening as needed. The scope was pulled down into the rectum where a thorough exam including retroflexion was performed.  Findings:   Prep excellent Small cecal polyp ablated via cold biopsy. Two small diverticula at sigmoid colon. Normal rectal mucosa. Two small anal papillae and hemorrhoids below the dentate line.   Therapeutic/Diagnostic Maneuvers Performed:  See above  Complications:  None   Cecal Withdrawal Time:  15 minutes  Impression:  Examination performed to cecum. Small cecal polyp ablated via cold biopsy. Two small diverticula at sigmoid colon. Small external hemorrhoids and 2 anal papillae but no evidence of rectal polyp or a mass.  Recommendations:  Standard instructions  given. Patient will continue high fiber diet, colace and prune juice. I will contact patient with biopsy results and further recommendations.  Mark Benjamin U  07/10/2012 3:14 PM  CC: Dr. Rudi Heap, MD & Dr. Bonnetta Barry ref. provider found

## 2012-07-11 ENCOUNTER — Other Ambulatory Visit: Payer: Self-pay

## 2012-07-11 MED ORDER — OMEGA-3-ACID ETHYL ESTERS 1 G PO CAPS: 2.0000 g | ORAL_CAPSULE | Freq: Two times a day (BID) | ORAL | Status: DC

## 2012-07-17 ENCOUNTER — Encounter (HOSPITAL_COMMUNITY): Payer: Self-pay | Admitting: Internal Medicine

## 2012-07-19 ENCOUNTER — Encounter (INDEPENDENT_AMBULATORY_CARE_PROVIDER_SITE_OTHER): Payer: Self-pay | Admitting: *Deleted

## 2012-07-23 ENCOUNTER — Other Ambulatory Visit: Payer: Self-pay | Admitting: Family Medicine

## 2012-07-31 ENCOUNTER — Other Ambulatory Visit: Payer: Self-pay | Admitting: Family Medicine

## 2012-08-16 ENCOUNTER — Other Ambulatory Visit: Payer: Self-pay | Admitting: *Deleted

## 2012-08-16 MED ORDER — OMEGA-3-ACID ETHYL ESTERS 1 G PO CAPS: 2.0000 g | ORAL_CAPSULE | Freq: Two times a day (BID) | ORAL | Status: DC

## 2012-09-27 ENCOUNTER — Other Ambulatory Visit: Payer: Self-pay | Admitting: Family Medicine

## 2012-09-28 ENCOUNTER — Other Ambulatory Visit: Payer: Self-pay | Admitting: Family Medicine

## 2012-10-30 ENCOUNTER — Ambulatory Visit (INDEPENDENT_AMBULATORY_CARE_PROVIDER_SITE_OTHER): Payer: No Typology Code available for payment source | Admitting: Family Medicine

## 2012-10-30 ENCOUNTER — Encounter: Payer: Self-pay | Admitting: Family Medicine

## 2012-10-30 ENCOUNTER — Other Ambulatory Visit: Payer: No Typology Code available for payment source

## 2012-10-30 VITALS — BP 134/73 | HR 78 | Temp 98.2°F | Ht 72.0 in | Wt 187.2 lb

## 2012-10-30 DIAGNOSIS — E559 Vitamin D deficiency, unspecified: Secondary | ICD-10-CM

## 2012-10-30 DIAGNOSIS — R29898 Other symptoms and signs involving the musculoskeletal system: Secondary | ICD-10-CM

## 2012-10-30 DIAGNOSIS — E785 Hyperlipidemia, unspecified: Secondary | ICD-10-CM

## 2012-10-30 DIAGNOSIS — N400 Enlarged prostate without lower urinary tract symptoms: Secondary | ICD-10-CM

## 2012-10-30 DIAGNOSIS — R5381 Other malaise: Secondary | ICD-10-CM

## 2012-10-30 DIAGNOSIS — M6281 Muscle weakness (generalized): Secondary | ICD-10-CM

## 2012-10-30 DIAGNOSIS — R5383 Other fatigue: Secondary | ICD-10-CM

## 2012-10-30 DIAGNOSIS — F209 Schizophrenia, unspecified: Secondary | ICD-10-CM

## 2012-10-30 DIAGNOSIS — I10 Essential (primary) hypertension: Secondary | ICD-10-CM

## 2012-10-30 LAB — POCT CBC
Granulocyte percent: 61.4 %G (ref 37–80)
HCT, POC: 43.7 % (ref 43.5–53.7)
MCHC: 33.5 g/dL (ref 31.8–35.4)
MCV: 92 fL (ref 80–97)
RDW, POC: 12.2 %
WBC: 4 10*3/uL — AB (ref 4.6–10.2)

## 2012-10-30 NOTE — Patient Instructions (Addendum)
Fall precautions discussed Continue current meds and therapeutic lifestyle changes Return to clinic in September or October for flu shot We will schedule a visit with neurologist to evaluate the motor weakness in the left hand This visit will be with [facility]

## 2012-10-30 NOTE — Progress Notes (Signed)
Subjective:    Patient ID: Mark Benjamin, male    DOB: March 19, 1961, 52 y.o.   MRN: [id number]  HPI Patient returns to clinic today for followup and management of chronic medical problems. These include hypertension, metabolic syndrome, vitamin D deficiency, and schizophrenia. He is followed by mental health for the schizophrenia. 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 Patient's all doctor recently because of frequent blinking and his. He gave him some eyedrops for this and this is helping some. Patient has complained of weakness in the left arm and this has been going on since the first of the year. There is no pain involved. There is no numbness or tingling or weakness is primarily motor in nature. He sees Dr. Gwenevere Abbot for his schizophrenia. He complains today of fine motor weakness in the left hand. This is apparently him with writing and removing things from his pocket etc.   Review of Systems  Constitutional: Positive for fatigue. Negative for activity change and appetite change.  HENT: Positive for sneezing. Negative for ear pain, congestion, sore throat, rhinorrhea, trouble swallowing, sinus pressure and tinnitus.   Eyes: Negative.  Negative for photophobia, pain, discharge, redness, itching and visual disturbance.  Respiratory: Negative.  Negative for cough, choking, chest tightness, shortness of breath and wheezing.   Cardiovascular: Negative.  Negative for chest pain, palpitations and leg swelling.  Gastrointestinal: Positive for constipation (intermitent). Negative for nausea, vomiting, abdominal pain, diarrhea and blood in stool.  Endocrine: Negative.  Negative for cold intolerance, heat intolerance, polydipsia, polyphagia and polyuria.  Genitourinary: Negative for dysuria, urgency, frequency, hematuria, difficulty urinating and testicular pain.  Musculoskeletal: Negative for myalgias, back pain and arthralgias.  Skin: Negative.  Negative  for color change, rash and wound.  Allergic/Immunologic: Positive for environmental allergies.  Neurological: Positive for weakness (L arm weakness). Negative for dizziness, tremors, numbness and headaches.  Psychiatric/Behavioral: Positive for sleep disturbance (occasional). Negative for hallucinations, confusion, decreased concentration and agitation. The patient is nervous/anxious (slight).        Objective:   Physical Exam BP 134/73  Pulse 78  Temp(Src) 98.2 F (36.8 C) (Oral)  Ht 6' (1.829 m)  Wt 187 lb 3.2 oz (84.913 kg)  BMI 25.38 kg/m2  The patient appeared well nourished and normally developed, alert and oriented to time and place. He has his usual very quiet demeanor. Speech, behavior and judgement appear normal. Vital signs as documented.  Head exam is unremarkable. No scleral icterus or pallor noted.  Neck is without jugular venous distension, thyromegally, or carotid bruits. Carotid upstrokes are brisk bilaterally. No cervical adenopathy. Lungs are clear anteriorly and posteriorly to auscultation. Normal respiratory effort. Cardiac exam reveals regular rate and rhythm at 84 per minute. First and second heart sounds normal.  No murmurs, rubs or gallops.  Abdominal exam reveals normal bowl sounds, no masses, no organomegaly and no aortic enlargement. No inguinal adenopathy. Extremities are nonedematous and both femoral and pedal pulses are normal. Skin without pallor or jaundice.  Warm and dry, without rash. Neurologic exam reveals normal deep tendon reflexes and normal sensation. He has good grip and good movement with the left hand compared to the right hand          Assessment & Plan:  1. Hypertension - BMP8+EGFR  2. Hyperlipemia - Hepatic function panel - NMR, lipoprofile  3. Vitamin D deficiency - Vitamin D 25 hydroxy  4. BPH (benign prostatic hyperplasia) -  PSA, total and free  5. Schizophrenia -Followed regularly by Dr. Geanie Cooley  6. Fatigue - Thyroid  Panel With TSH - POCT CBC  7. Left hand weakness -Visit with [facility] neurology will be scheduled, patient prefers a Tuesday or Thursday day but not this coming Thursday  Patient Instructions  Fall precautions discussed Continue current meds and therapeutic lifestyle changes Return to clinic in September or October for flu shot We will schedule a visit with neurologist to evaluate the motor weakness in the left hand This visit will be with [facility]   Nyra Capes MD

## 2012-10-30 NOTE — Addendum Note (Signed)
Addended by: Bearl Mulberry on: 10/30/2012 06:52 PM   Modules accepted: Orders

## 2012-11-01 LAB — PSA, TOTAL AND FREE: PSA, Free Pct: 45.7 %

## 2012-11-01 LAB — HEPATIC FUNCTION PANEL
AST: 28 IU/L (ref 0–40)
Albumin: 4.9 g/dL (ref 3.5–5.5)
Alkaline Phosphatase: 60 IU/L (ref 39–117)
Bilirubin, Direct: 0.22 mg/dL (ref 0.00–0.40)

## 2012-11-01 LAB — BMP8+EGFR
BUN/Creatinine Ratio: 5 — ABNORMAL LOW (ref 9–20)
BUN: 5 mg/dL — ABNORMAL LOW (ref 6–24)
Chloride: 101 mmol/L (ref 97–108)
GFR calc Af Amer: 97 mL/min/{1.73_m2} (ref >59)

## 2012-11-01 LAB — NMR, LIPOPROFILE
HDL Particle Number: 31.4 umol/L (ref >=30.5)
LDL Size: 20.7 nm (ref >20.5)
LDLC SERPL CALC-MCNC: 51 mg/dL (ref <100)
LP-IR Score: <25 (ref <=45)
Small LDL Particle Number: <90 nmol/L (ref <=527)

## 2012-11-01 LAB — THYROID PANEL WITH TSH
Free Thyroxine Index: 2.8 (ref 1.2–4.9)
T4, Total: 8.9 ug/dL (ref 4.5–12.0)
TSH: 1.98 u[IU]/mL (ref 0.450–4.500)

## 2012-11-01 LAB — VITAMIN D 25 HYDROXY (VIT D DEFICIENCY, FRACTURES): Vit D, 25-Hydroxy: 42.5 ng/mL (ref 30.0–100.0)

## 2012-11-02 ENCOUNTER — Other Ambulatory Visit: Payer: Self-pay | Admitting: Family Medicine

## 2012-11-21 ENCOUNTER — Encounter: Payer: Self-pay | Admitting: Diagnostic Neuroimaging

## 2012-11-21 ENCOUNTER — Ambulatory Visit (INDEPENDENT_AMBULATORY_CARE_PROVIDER_SITE_OTHER): Payer: No Typology Code available for payment source | Admitting: Diagnostic Neuroimaging

## 2012-11-21 VITALS — BP 131/77 | HR 78 | Temp 97.1°F | Ht 72.0 in | Wt 177.0 lb

## 2012-11-21 DIAGNOSIS — M542 Cervicalgia: Secondary | ICD-10-CM | POA: Insufficient documentation

## 2012-11-21 DIAGNOSIS — R29898 Other symptoms and signs involving the musculoskeletal system: Secondary | ICD-10-CM

## 2012-11-21 DIAGNOSIS — G20C Parkinsonism, unspecified: Secondary | ICD-10-CM | POA: Insufficient documentation

## 2012-11-21 DIAGNOSIS — G20 Parkinson's disease: Secondary | ICD-10-CM

## 2012-11-21 NOTE — Progress Notes (Signed)
[facility]  PATIENT: Mark Benjamin DOB: [birth year]  REFERRING CLINICIAN: Gari Crown HISTORY FROM: patient REASON FOR VISIT: new consult   HISTORICAL  CHIEF COMPLAINT:  Chief Complaint  Patient presents with  . Extremity Weakness     Left arm (since January 2014)    HISTORY OF PRESENT ILLNESS:   52 year old left-handed male with history of diabetes, hypercholesterolemia, schizophrenia, on thioridazine since 1986, here for evaluation of left arm weakness and numbness since March 2014.  Patient reports gradual onset, progressive numbness and weakness and slowness of his left arm. He denies any symptoms in his right arm or legs. No facial droop or facial numbness. He has noticed some slurred speech over the past one week. Patient is left-handed and has noticed difficulty with brushing his teeth with his left hand, handwriting and other fine movements.  Patient's psychiatrist reduced thioridazine dose from 300 mg at bedtime down to 200 mg at bedtime last month, out of consideration of the patient's presenting symptoms as a potential side effect of medication. Since reducing the dose, no change in symptoms.  REVIEW OF SYSTEMS: Full 14 system review of systems performed and notable only for blurred vision shortness of breath weakness.  ALLERGIES: Allergies  Allergen Reactions  . Erythromycin   . Zocor [Simvastatin]     HOME MEDICATIONS: Prior to Admission medications   Medication Sig Start Date End Date Taking? Authorizing Provider  Cholecalciferol (VITAMIN D3) 1000 UNITS CAPS Take 3 tablets by mouth daily.    Yes Historical Provider, MD  CRESTOR 20 MG tablet TAKE 1 TABLET BY MOUTH ONCE A DAY 09/28/12  Yes Ernestina Penna, MD  fluorometholone (FML) 0.1 % ophthalmic suspension  11/05/12  Yes Historical Provider, MD  fosinopril-hydrochlorothiazide (MONOPRIL-HCT) 10-12.5 MG per tablet TAKE 1 TABLET BY MOUTH ONCE A DAY 09/27/12  Yes Ernestina Penna, MD  niacin (NIASPAN)  1000 MG CR tablet TAKE 1 TABLET (1,000 MG TOTAL) BY MOUTH AT BEDTIME. 07/31/12  Yes Ernestina Penna, MD  omega-3 acid ethyl esters (LOVAZA) 1 G capsule Take 2 capsules (2 g total) by mouth 2 (two) times daily. 08/16/12  Yes Ernestina Penna, MD  pioglitazone (ACTOS) 15 MG tablet TAKE 1 TABLET BY MOUTH ONCE A DAY 11/02/12  Yes Ernestina Penna, MD  pioglitazone (ACTOS) 30 MG tablet Take 15 mg by mouth daily.    Yes Historical Provider, MD  thioridazine (MELLARIL) 100 MG tablet Take 300 mg by mouth at bedtime. 3 tabs qhs   Yes Historical Provider, MD   Outpatient Prescriptions Prior to Visit  Medication Sig Dispense Refill  . Cholecalciferol (VITAMIN D3) 1000 UNITS CAPS Take 3 tablets by mouth daily.       . CRESTOR 20 MG tablet TAKE 1 TABLET BY MOUTH ONCE A DAY  90 tablet  0  . fosinopril-hydrochlorothiazide (MONOPRIL-HCT) 10-12.5 MG per tablet TAKE 1 TABLET BY MOUTH ONCE A DAY  30 tablet  2  . niacin (NIASPAN) 1000 MG CR tablet TAKE 1 TABLET (1,000 MG TOTAL) BY MOUTH AT BEDTIME.  30 tablet  5  . omega-3 acid ethyl esters (LOVAZA) 1 G capsule Take 2 capsules (2 g total) by mouth 2 (two) times daily.  120 capsule  4  . pioglitazone (ACTOS) 15 MG tablet TAKE 1 TABLET BY MOUTH ONCE A DAY  90 tablet  0  . pioglitazone (ACTOS) 30 MG tablet Take 15 mg by mouth daily.       Marland Kitchen thioridazine (MELLARIL) 100 MG  tablet Take 300 mg by mouth at bedtime. 3 tabs qhs       No facility-administered medications prior to visit.    PAST MEDICAL HISTORY: Past Medical History  Diagnosis Date  . Essential hypertension, benign   . Other and unspecified hyperlipidemia   . 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 Schizophrenia   . Hyperplasia of prostate   . Fatty liver   . Colon polyps   . Dysphagia     PAST SURGICAL HISTORY: Past Surgical History  Procedure Laterality Date  . Upper gastrointestinal endoscopy  EGD ED    09/09/2010  . Colonoscopy  02/13/08  . Colonoscopy N/A 07/10/2012     Procedure: COLONOSCOPY;  Surgeon: Malissa Hippo, MD;  [facility]: [facility];  Service: Endoscopy;  Laterality: N/A;  225    FAMILY HISTORY: No family history on file.  SOCIAL HISTORY:  History   Social History  . Marital Status: Married    Spouse Name: N/A    Number of Children: N/A  . Years of Education: N/A   Occupational History  . Not on file.   Social History Main Topics  . Smoking status: Never Smoker   . Smokeless tobacco: Not on file  . Alcohol Use: Yes     Comment: very rare  . Drug Use: No  . Sexual Activity: Not on file   Other Topics Concern  . Not on file   Social History Narrative  . No narrative on file     PHYSICAL EXAM  Filed Vitals:   11/21/12 0945  BP: 131/77  Pulse: 78  Temp: 97.1 F (36.2 C)  TempSrc: Oral  Height: 6' (1.829 m)  Weight: 177 lb (80.287 kg)    Not recorded    Body mass index is 24 kg/(m^2).  GENERAL EXAM: Patient is in no distress  CARDIOVASCULAR: Regular rate and rhythm, no murmurs, no carotid bruits  NEUROLOGIC: MENTAL STATUS: awake, alert, language fluent, comprehension intact, naming intact; MASKED FACIES. CRANIAL NERVE: no papilledema on fundoscopic exam, pupils equal and reactive to light, visual fields full to confrontation, extraocular muscles intact, no nystagmus, facial sensation and strength symmetric, uvula midline, shoulder shrug symmetric, tongue midline. MOTOR: INCREASED TONE IN LUE WITH RARE, FINE REST TREMOR. BRADYKINESIA IN LUE.  SENSORY: normal and symmetric to light touch, pinprick, temperature, vibration COORDINATION: finger-nose-finger, fine finger movements normal REFLEXES: deep tendon reflexes BRISK and symmetric GAIT/STATION: narrow based gait; STOOPED POSTURE. SMALL STEPS. NO ARM SWING.   DIAGNOSTIC DATA (LABS, IMAGING, TESTING) - I reviewed patient records, labs, notes, testing and imaging myself where available.  Lab Results  Component Value Date   WBC 4.0* 10/30/2012   HGB  14.7 10/30/2012   HCT 43.7 10/30/2012   MCV 92.0 10/30/2012   PLT 242 05/04/2010      Component Value Date/Time   NA 141 10/30/2012 1027   NA 140 06/22/2012 1528   K 4.5 10/30/2012 1027   CL 101 10/30/2012 1027   CO2 26 10/30/2012 1027   GLUCOSE 94 10/30/2012 1027   GLUCOSE 98 06/22/2012 1528   BUN 5* 10/30/2012 1027   BUN 7 06/22/2012 1528   CREATININE 1.02 10/30/2012 1027   CREATININE 0.89 06/22/2012 1528   CALCIUM 10.0 10/30/2012 1027   PROT 6.8 10/30/2012 1027   PROT 6.7 06/22/2012 1528   ALBUMIN 5.1 06/22/2012 1528   AST 28 10/30/2012 1027   ALT 29 10/30/2012 1027   ALKPHOS 60 10/30/2012 1027  BILITOT 0.8 10/30/2012 1027   GFRNONAA 84 10/30/2012 1027   GFRAA 97 10/30/2012 1027   Lab Results  Component Value Date   CHOL 113 10/30/2012   LDLCALC 59 06/22/2012   TRIG 67 06/22/2012   No results found for this basename: HGBA1C   No results found for this basename: VITAMINB12   Lab Results  Component Value Date   TSH 1.980 10/30/2012      ASSESSMENT AND PLAN  52 y.o. year old male here with gradual onset, progressive left arm weakness, slowness and stiffness with some numbness. On exam, patient has masked facies, rigidity in the left arm, bradykinesia in the left arm, stooped posture and poor arm swing.  Ddx: drug-induced parkinsonism, vascular parkinsonism, idiopathic Parkinson's disease, stroke, cervical spine disease  PLAN: 1. MRI brain and c-spine 2. May consider carb/levo in future, but would be challenging given his diagnosis of schizophrenia   Orders Placed This Encounter  Procedures  . MR Brain Wo Contrast  . MR Cervical Spine Wo Contrast   Return in about 3 months (around 02/20/2013).    Suanne Marker, MD 11/21/2012, 11:05 AM Certified in Neurology, Neurophysiology and Neuroimaging  [facility] [address], [address] [place], [place] [address] [phone]

## 2012-11-21 NOTE — Patient Instructions (Signed)
i will check mri scans. 

## 2012-11-29 ENCOUNTER — Ambulatory Visit
Admission: RE | Admit: 2012-11-29 | Discharge: 2012-11-29 | Disposition: A | Discharge status: Home / Self Care | Payer: No Typology Code available for payment source | Source: Ambulatory Visit | Attending: Diagnostic Neuroimaging | Admitting: Diagnostic Neuroimaging

## 2012-11-29 DIAGNOSIS — M542 Cervicalgia: Secondary | ICD-10-CM

## 2012-11-29 DIAGNOSIS — R29898 Other symptoms and signs involving the musculoskeletal system: Secondary | ICD-10-CM

## 2012-11-29 DIAGNOSIS — G20 Parkinson's disease: Secondary | ICD-10-CM

## 2012-12-11 ENCOUNTER — Telehealth: Payer: Self-pay | Admitting: Diagnostic Neuroimaging

## 2012-12-11 ENCOUNTER — Other Ambulatory Visit: Payer: Self-pay | Admitting: *Deleted

## 2012-12-11 DIAGNOSIS — R531 Weakness: Secondary | ICD-10-CM

## 2012-12-11 NOTE — Telephone Encounter (Signed)
I gave results to pt and relayed referral to NS for moderate spinal stenosis. He vrebalized understanding.

## 2013-01-01 ENCOUNTER — Other Ambulatory Visit: Payer: Self-pay | Admitting: Family Medicine

## 2013-01-18 ENCOUNTER — Telehealth: Payer: Self-pay | Admitting: Diagnostic Neuroimaging

## 2013-01-18 NOTE — Telephone Encounter (Signed)
error 

## 2013-01-22 ENCOUNTER — Other Ambulatory Visit: Payer: Self-pay | Admitting: Family Medicine

## 2013-01-25 ENCOUNTER — Other Ambulatory Visit: Payer: Self-pay | Admitting: Family Medicine

## 2013-02-13 ENCOUNTER — Telehealth: Payer: Self-pay | Admitting: Diagnostic Neuroimaging

## 2013-02-13 NOTE — Telephone Encounter (Signed)
I called and spoke to pt and relayed that original referral was made back in end of Sept and beg October.  Pt was given # to call and see if they will accept his new insurance and if se try to make an appt. If needs new referral we will be glad to send.   He will call back as needed.

## 2013-02-26 ENCOUNTER — Encounter (INDEPENDENT_AMBULATORY_CARE_PROVIDER_SITE_OTHER): Payer: Self-pay

## 2013-02-26 ENCOUNTER — Ambulatory Visit (INDEPENDENT_AMBULATORY_CARE_PROVIDER_SITE_OTHER): Payer: No Typology Code available for payment source | Admitting: Diagnostic Neuroimaging

## 2013-02-26 ENCOUNTER — Encounter: Payer: Self-pay | Admitting: Diagnostic Neuroimaging

## 2013-02-26 ENCOUNTER — Telehealth: Payer: Self-pay | Admitting: Diagnostic Neuroimaging

## 2013-02-26 VITALS — BP 123/69 | HR 70 | Temp 98.0°F | Ht 73.0 in | Wt 178.5 lb

## 2013-02-26 DIAGNOSIS — R29898 Other symptoms and signs involving the musculoskeletal system: Secondary | ICD-10-CM

## 2013-02-26 DIAGNOSIS — M4802 Spinal stenosis, cervical region: Secondary | ICD-10-CM

## 2013-02-26 DIAGNOSIS — R292 Abnormal reflex: Secondary | ICD-10-CM

## 2013-02-26 DIAGNOSIS — G20 Parkinson's disease: Secondary | ICD-10-CM

## 2013-02-26 NOTE — Telephone Encounter (Signed)
Pls setup referral to neurosurgery. Has had some insurance issues in the past.

## 2013-02-26 NOTE — Telephone Encounter (Signed)
Patient checked out today and said he would like someone to contact him about a neurosurgeon referral. Please call.

## 2013-02-26 NOTE — Telephone Encounter (Signed)
Please advise 

## 2013-02-26 NOTE — Patient Instructions (Signed)
I will refer you to neurosurgery again.

## 2013-02-26 NOTE — Progress Notes (Signed)
[facility]  PATIENT: Mark Benjamin DOB: 02-22-1961  REFERRING CLINICIAN: Gari Crown HISTORY FROM: patient REASON FOR VISIT: follow up   HISTORICAL  CHIEF COMPLAINT:  Chief Complaint  Patient presents with  . Follow-up    numbness and weakness in L arm    HISTORY OF PRESENT ILLNESS:   UPDATE 02/26/13: Since last visit patient had MRI of the brain and cervical spine which shows moderate spinal stenosis at C5-6 level with severe bilateral foraminal stenosis. Also mild spinal stenosis and moderate right foraminal stenosis at C6-7. No cord signal abnormalities. Since last visit thioridazine dosing has been reduced. Overall his symptoms are stable to slightly progressed.  PRIOR HPI (11/21/12): 52 year old left-handed male with history of diabetes, hypercholesterolemia, schizophrenia, on thioridazine since 1986, here for evaluation of left arm weakness and numbness since March 2014.  Patient reports gradual onset, progressive numbness and weakness and slowness of his left arm. He denies any symptoms in his right arm or legs. No facial droop or facial numbness. He has noticed some slurred speech over the past one week. Patient is left-handed and has noticed difficulty with brushing his teeth with his left hand, handwriting and other fine movements.  Patient's psychiatrist reduced thioridazine dose from 300 mg at bedtime down to 200 mg at bedtime last month, out of consideration of the patient's presenting symptoms as a potential side effect of medication. Since reducing the dose, no change in symptoms.  REVIEW OF SYSTEMS: Full 14 system review of systems performed and notable only for numbness these difficulty and left arm weakness.  ALLERGIES: Allergies  Allergen Reactions  . Erythromycin   . Zocor [Simvastatin]     HOME MEDICATIONS: Outpatient Prescriptions Prior to Visit  Medication Sig Dispense Refill  . Cholecalciferol (VITAMIN D3) 1000 UNITS CAPS Take 3  tablets by mouth daily.       . CRESTOR 20 MG tablet TAKE 1 TABLET BY MOUTH ONCE A DAY  90 tablet  1  . fluorometholone (FML) 0.1 % ophthalmic suspension       . fosinopril-hydrochlorothiazide (MONOPRIL-HCT) 10-12.5 MG per tablet TAKE 1 TABLET BY MOUTH ONCE A DAY  30 tablet  3  . LOVAZA 1 G capsule TAKE 2 CAPSULES (2 G TOTAL) BY MOUTH 2 (TWO) TIMES DAILY.  120 capsule  2  . niacin (NIASPAN) 1000 MG CR tablet TAKE 1 TABLET (1,000 MG TOTAL) BY MOUTH AT BEDTIME.  30 tablet  2  . pioglitazone (ACTOS) 15 MG tablet TAKE 1 TABLET BY MOUTH ONCE A DAY  90 tablet  0  . pioglitazone (ACTOS) 30 MG tablet Take 15 mg by mouth daily.       Marland Kitchen thioridazine (MELLARIL) 100 MG tablet Take 200 mg by mouth at bedtime. 3 tabs qhs       No facility-administered medications prior to visit.    PAST MEDICAL HISTORY: Past Medical History  Diagnosis Date  . Essential hypertension, benign   . Other and unspecified hyperlipidemia   . 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 Schizophrenia   . Hyperplasia of prostate   . Fatty liver   . Colon polyps   . Dysphagia     PAST SURGICAL HISTORY: Past Surgical History  Procedure Laterality Date  . Upper gastrointestinal endoscopy  EGD ED    09/09/2010  . Colonoscopy  02/13/08  . Colonoscopy N/A 07/10/2012    Procedure: COLONOSCOPY;  Surgeon: Malissa Hippo, MD;  Location: [facility];  Service: Endoscopy;  Laterality: N/A;  225    FAMILY HISTORY: History reviewed. No pertinent family history.  SOCIAL HISTORY:  History   Social History  . Marital Status: Single    Spouse Name: N/A    Number of Children: 0  . Years of Education: 12th   Occupational History  .     Social History Main Topics  . Smoking status: Never Smoker   . Smokeless tobacco: Never Used  . Alcohol Use: Yes     Comment: very rare  . Drug Use: No  . Sexual Activity: Not on file   Other Topics Concern  . Not on file   Social History Narrative   Patient  lives at home with his mother.   Caffeine Use: none     PHYSICAL EXAM  Filed Vitals:   02/26/13 1129  BP: 123/69  Pulse: 70  Temp: 98 F (36.7 C)  TempSrc: Oral  Height: 6\' 1"  (1.854 m)  Weight: 178 lb 8 oz (80.967 kg)    Not recorded    Body mass index is 23.56 kg/(m^2).  GENERAL EXAM: Patient is in no distress  CARDIOVASCULAR: Regular rate and rhythm, no murmurs, no carotid bruits  NEUROLOGIC: MENTAL STATUS: awake, alert, language fluent, comprehension intact, naming intact; MASKED FACIES. CRANIAL NERVE: no papilledema on fundoscopic exam, pupils equal and reactive to light, visual fields full to confrontation, extraocular muscles intact, no nystagmus, facial sensation and strength symmetric, uvula midline, shoulder shrug symmetric, tongue midline. MOTOR: INCREASED TONE IN LUE WITH RARE, FINE REST TREMOR. SIGNIFICANT BRADYKINESIA IN LUE. RUE AND BLE 5. LUE FINGER ABDUCTION AND EXTENSION 4.  SENSORY: normal and symmetric to light touch, pinprick, temperature, vibration COORDINATION: finger-nose-finger, fine finger movements normal REFLEXES: RUE 3, LUE 4, KNEES 3, ANKLES 2.  GAIT/STATION: narrow based gait; STOOPED POSTURE. SMALL STEPS. ABSENT ARM SWING.   DIAGNOSTIC DATA (LABS, IMAGING, TESTING) - I reviewed patient records, labs, notes, testing and imaging myself where available.  Lab Results  Component Value Date   WBC 4.0* 10/30/2012   HGB 14.7 10/30/2012   HCT 43.7 10/30/2012   MCV 92.0 10/30/2012   PLT 242 05/04/2010      Component Value Date/Time   NA 141 10/30/2012 1027   NA 140 06/22/2012 1528   K 4.5 10/30/2012 1027   CL 101 10/30/2012 1027   CO2 26 10/30/2012 1027   GLUCOSE 94 10/30/2012 1027   GLUCOSE 98 06/22/2012 1528   BUN 5* 10/30/2012 1027   BUN 7 06/22/2012 1528   CREATININE 1.02 10/30/2012 1027   CREATININE 0.89 06/22/2012 1528   CALCIUM 10.0 10/30/2012 1027   PROT 6.8 10/30/2012 1027   PROT 6.7 06/22/2012 1528   ALBUMIN 5.1 06/22/2012 1528   AST 28  10/30/2012 1027   ALT 29 10/30/2012 1027   ALKPHOS 60 10/30/2012 1027   BILITOT 0.8 10/30/2012 1027   GFRNONAA 84 10/30/2012 1027   GFRAA 97 10/30/2012 1027   Lab Results  Component Value Date   CHOL 113 10/30/2012   LDLCALC 59 06/22/2012   TRIG 67 06/22/2012   No results found for this basename: HGBA1C   No results found for this basename: VITAMINB12   Lab Results  Component Value Date   TSH 1.980 10/30/2012    11/29/12 MRI cervical  1. At C5-6: disc bulging with moderate spinal stenosis and severe biforaminal stenosis  2. At C3-4, C4-5, C6-7: disc bulging with mild spinal stenosis and severe biforaminal stenosis  3. No cord signal  abnormalities.  11/29/12 MRI brain (without) demonstrating:  1. Single right frontal subcortical focus (3mm) of non-specific gliosis.  2. No acute findings.   ASSESSMENT AND PLAN  52 y.o. year old male here with gradual onset, progressive left arm weakness, slowness and stiffness with some numbness. On exam, patient has masked facies, rigidity in the left arm, bradykinesia in the left arm, stooped posture and poor arm swing. Also with hyperreflexia in BUE and BLE.   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 Referral to neurosurgery again (was made, but patient's insurance not accepted; now getting new insurance in Jan 2015) 2. May consider carbidopa/levodopa in future, but would be challenging given his diagnosis of schizophrenia; for now, agree with tapering thio   Orders Placed This Encounter  Procedures  . Ambulatory referral to Neurosurgery   Return in about 3 months (around 05/27/2013) for with Heide Guile or Penumalli.    Suanne Marker, MD 02/26/2013, 12:29 PM Certified in Neurology, Neurophysiology and Neuroimaging  [facility] [address] [place], [place] [address] [phone]

## 2013-02-27 NOTE — Telephone Encounter (Signed)
Referral to neurosurgery.

## 2013-03-08 ENCOUNTER — Telehealth: Payer: Self-pay | Admitting: Family Medicine

## 2013-03-09 NOTE — Telephone Encounter (Signed)
appt for 12/23 with [facility]. Patient stated the arm pain has been going on. He was advised if he had any SOB or chest pain to go straight to the ER

## 2013-03-13 ENCOUNTER — Ambulatory Visit: Payer: No Typology Code available for payment source | Admitting: Family Medicine

## 2013-04-02 ENCOUNTER — Other Ambulatory Visit: Payer: Self-pay | Admitting: Neurosurgery

## 2013-04-02 ENCOUNTER — Ambulatory Visit (INDEPENDENT_AMBULATORY_CARE_PROVIDER_SITE_OTHER): Payer: Medicare Other | Admitting: Family Medicine

## 2013-04-02 ENCOUNTER — Ambulatory Visit (INDEPENDENT_AMBULATORY_CARE_PROVIDER_SITE_OTHER): Payer: Medicare Other

## 2013-04-02 ENCOUNTER — Encounter: Payer: Self-pay | Admitting: Family Medicine

## 2013-04-02 VITALS — BP 123/70 | HR 67 | Temp 97.4°F | Ht 73.0 in | Wt 171.0 lb

## 2013-04-02 DIAGNOSIS — N401 Enlarged prostate with lower urinary tract symptoms: Secondary | ICD-10-CM | POA: Insufficient documentation

## 2013-04-02 DIAGNOSIS — I10 Essential (primary) hypertension: Secondary | ICD-10-CM | POA: Insufficient documentation

## 2013-04-02 DIAGNOSIS — F209 Schizophrenia, unspecified: Secondary | ICD-10-CM

## 2013-04-02 DIAGNOSIS — G20 Parkinson's disease: Secondary | ICD-10-CM

## 2013-04-02 DIAGNOSIS — N400 Enlarged prostate without lower urinary tract symptoms: Secondary | ICD-10-CM | POA: Insufficient documentation

## 2013-04-02 DIAGNOSIS — E785 Hyperlipidemia, unspecified: Secondary | ICD-10-CM | POA: Insufficient documentation

## 2013-04-02 DIAGNOSIS — E8881 Metabolic syndrome: Secondary | ICD-10-CM | POA: Insufficient documentation

## 2013-04-02 DIAGNOSIS — E559 Vitamin D deficiency, unspecified: Secondary | ICD-10-CM

## 2013-04-02 DIAGNOSIS — G959 Disease of spinal cord, unspecified: Secondary | ICD-10-CM

## 2013-04-02 LAB — POCT CBC
Granulocyte percent: 55.4 %G (ref 37–80)
HCT, POC: 41.7 % — AB (ref 43.5–53.7)
Hemoglobin: 13.7 g/dL — AB (ref 14.1–18.1)
LYMPH, POC: 1.5 (ref 0.6–3.4)
MCH: 31.2 pg (ref 27–31.2)
MCHC: 32.9 g/dL (ref 31.8–35.4)
MCV: 94.9 fL (ref 80–97)
MPV: 7.3 fL (ref 0–99.8)
PLATELET COUNT, POC: 141 10*3/uL — AB (ref 142–424)
POC Granulocyte: 2.2 (ref 2–6.9)
POC LYMPH %: 36.8 % (ref 10–50)
RBC: 4.4 M/uL — AB (ref 4.69–6.13)
RDW, POC: 12.2 %
WBC: 4 10*3/uL — AB (ref 4.6–10.2)

## 2013-04-02 NOTE — Patient Instructions (Addendum)
Continue current medications. Continue good therapeutic lifestyle changes which include good diet and exercise. Fall precautions discussed with patient. Schedule your flu vaccine if you haven't had it yet If you are over 53 years old - you may need Prevnar 17 or the adult Pneumonia vaccine. Get MRI as planned Followup with the neurosurgeon as planned We will see you back in 4 weeks and discuss those results and whether a visit to the neurologist as necessary after that. Try the Advil one after breakfast and supper to see if that will help the neck pain. If it bothers your, stomach discontinue this.

## 2013-04-02 NOTE — Progress Notes (Signed)
Subjective:    Patient ID: Mark Benjamin, male    DOB: 1961/01/12, 53 y.o.   MRN: [id number]  HPI Pt here for follow up and management of chronic medical problems. Patient has had increasing neck pain and has seen the neurosurgeon and has a second MRI with contrast pending for he sees the neurosurgeon again. He is also seen the neurologist and has been given a tentative diagnosis of Parkinson's. He will be given an FOBT today and get his lab work done today. He is to check with his insurance regarding the Prevnar vaccine. He does complain of some lower thoracic pain with standing.       Patient Active Problem List   Diagnosis Date Noted  . Left arm weakness 11/21/2012  . Parkinsonism 11/21/2012  . Neck pain 11/21/2012  . Colon adenomas 06/29/2012  . Rectal mass 06/29/2012  . Schizophrenia 06/22/2012  . Dysphagia 06/22/2011  . Eosinophilic esophagitis 47/82/9562   Outpatient Encounter Prescriptions as of 04/02/2013  Medication Sig  . Cholecalciferol (VITAMIN D3) 1000 UNITS CAPS Take 3 tablets by mouth daily.   . CRESTOR 20 MG tablet TAKE 1 TABLET BY MOUTH ONCE A DAY  . fosinopril-hydrochlorothiazide (MONOPRIL-HCT) 10-12.5 MG per tablet TAKE 1 TABLET BY MOUTH ONCE A DAY  . LOVAZA 1 G capsule TAKE 2 CAPSULES (2 G TOTAL) BY MOUTH 2 (TWO) TIMES DAILY.  . niacin (NIASPAN) 1000 MG CR tablet TAKE 1 TABLET (1,000 MG TOTAL) BY MOUTH AT BEDTIME.  . pioglitazone (ACTOS) 30 MG tablet Take 15 mg by mouth daily.   Marland Kitchen thioridazine (MELLARIL) 100 MG tablet Take 200 mg by mouth at bedtime.   . [DISCONTINUED] fluorometholone (FML) 0.1 % ophthalmic suspension   . [DISCONTINUED] pioglitazone (ACTOS) 15 MG tablet TAKE 1 TABLET BY MOUTH ONCE A DAY    Review of Systems  Constitutional: Negative.   HENT: Negative.   Eyes: Negative.   Respiratory: Negative.   Cardiovascular: Negative.   Gastrointestinal: Negative.   Endocrine: Negative.   Genitourinary: Negative.   Musculoskeletal: Positive for  arthralgias (left arm pain- had 2 mri's and going for a 3rd).  Skin: Negative.   Allergic/Immunologic: Negative.   Neurological: Negative.   Hematological: Negative.   Psychiatric/Behavioral: Negative.        Objective:   Physical Exam  Nursing note and vitals reviewed. Constitutional: He is oriented to person, place, and time. He appears well-developed and well-nourished. No distress.  Flat affect as usual  HENT:  Head: Normocephalic and atraumatic.  Right Ear: External ear normal.  Left Ear: External ear normal.  Nose: Nose normal.  Mouth/Throat: Oropharynx is clear and moist. No oropharyngeal exudate.  Eyes: Conjunctivae and EOM are normal. Pupils are equal, round, and reactive to light. Right eye exhibits no discharge. Left eye exhibits no discharge. No scleral icterus.  Neck: Normal range of motion. Neck supple. No tracheal deviation present. No thyromegaly present.  No carotid bruits  Cardiovascular: Normal rate, regular rhythm and normal heart sounds.  Exam reveals no gallop and no friction rub.   No murmur heard. At 72 per minute  Pulmonary/Chest: Effort normal and breath sounds normal. No respiratory distress. He has no wheezes. He has no rales. He exhibits no tenderness.  Abdominal: Soft. Bowel sounds are normal. He exhibits no mass. There is no tenderness. There is no rebound and no guarding.  Musculoskeletal: Normal range of motion. He exhibits no edema and no tenderness.  Note point tenderness or rash in the low back.  Lymphadenopathy:    He has no cervical adenopathy.  Neurological: He is alert and oriented to person, place, and time. He has normal reflexes. No cranial nerve deficit.  Patient does have rigidity and a flat affect but this is really unchanged from the past.  Skin: Skin is warm and dry. No rash noted. No erythema. No pallor.  Psychiatric: His behavior is normal. Judgment and thought content normal.  Flat affect and normal behavior for patient   BP  123/70  Pulse 67  Temp(Src) 97.4 F (36.3 C) (Oral)  Ht _0  (1.854 m)  Wt 171 lb (77.565 kg)  BMI 22.57 kg/m2  [facility] reading (PRIMARY) by  Dr.Keyontay Stolz;-chest x-ray;  no active lung disease,  arthritic changes in spine                                      Assessment & Plan:  1. Parkinsonism -Followed by neurologist - POCT CBC  2. Schizophrenia - POCT CBC  3. HTN (hypertension) - DG Chest 2 View; Future - POCT CBC - BMP8+EGFR - Hepatic function panel  4. Hyperlipidemia - POCT CBC - NMR, lipoprofile  5. Vitamin D deficiency - POCT CBC - Vit D  25 hydroxy (rtn osteoporosis monitoring)  6. Metabolic syndrome  7. Hypertension  8. BPH (benign prostatic hyperplasia)   Orders Placed This Encounter  Procedures  . DG Chest 2 View    Standing Status: Future     Number of Occurrences:      Standing Expiration Date: 06/02/2014    Order Specific Question:  Reason for Exam (SYMPTOM  OR DIAGNOSIS REQUIRED)    Answer:  htn    Order Specific Question:  Preferred imaging location?    Answer:  Internal  . BMP8+EGFR  . Hepatic function panel  . NMR, lipoprofile  . Vit D  25 hydroxy (rtn osteoporosis monitoring)  . POCT CBC   No orders of the defined types were placed in this encounter.   Patient Instructions  Continue current medications. Continue good therapeutic lifestyle changes which include good diet and exercise. Fall precautions discussed with patient. Schedule your flu vaccine if you haven't had it yet If you are over 40 years old - you may need Prevnar 3 or the adult Pneumonia vaccine. Get MRI as planned Followup with the neurosurgeon as planned We will see you back in 4 weeks and discuss those results and whether a visit to the neurologist as necessary after that. Try the Advil one after breakfast and supper to see if that will help the neck pain. If it bothers your, stomach discontinue this.   Arrie Senate MD

## 2013-04-05 LAB — HEPATIC FUNCTION PANEL
ALBUMIN: 4.8 g/dL (ref 3.5–5.5)
ALT: 24 IU/L (ref 0–44)
AST: 31 IU/L (ref 0–40)
Alkaline Phosphatase: 60 IU/L (ref 39–117)
BILIRUBIN DIRECT: 0.34 mg/dL (ref 0.00–0.40)
TOTAL PROTEIN: 6.4 g/dL (ref 6.0–8.5)
Total Bilirubin: 1.3 mg/dL — ABNORMAL HIGH (ref 0.0–1.2)

## 2013-04-05 LAB — NMR, LIPOPROFILE
Cholesterol: 96 mg/dL (ref <200)
HDL Cholesterol by NMR: 50 mg/dL (ref >=40)
HDL Particle Number: 29.7 umol/L — ABNORMAL LOW (ref >=30.5)
LDLC SERPL CALC-MCNC: 35 mg/dL (ref <100)
Small LDL Particle Number: <90 nmol/L (ref <=527)
Triglycerides by NMR: 54 mg/dL (ref <150)

## 2013-04-05 LAB — VITAMIN D 25 HYDROXY (VIT D DEFICIENCY, FRACTURES): VIT D 25 HYDROXY: 50.3 ng/mL (ref 30.0–100.0)

## 2013-04-05 LAB — BMP8+EGFR
BUN / CREAT RATIO: 7 — AB (ref 9–20)
BUN: 6 mg/dL (ref 6–24)
CHLORIDE: 96 mmol/L — AB (ref 97–108)
CO2: 29 mmol/L (ref 18–29)
Calcium: 9.3 mg/dL (ref 8.7–10.2)
Creatinine, Ser: 0.86 mg/dL (ref 0.76–1.27)
GFR calc Af Amer: 115 mL/min/{1.73_m2} (ref >59)
GFR calc non Af Amer: 100 mL/min/{1.73_m2} (ref >59)
Glucose: 93 mg/dL (ref 65–99)
POTASSIUM: 3.3 mmol/L — AB (ref 3.5–5.2)
SODIUM: 138 mmol/L (ref 134–144)

## 2013-04-12 ENCOUNTER — Ambulatory Visit
Admission: RE | Admit: 2013-04-12 | Discharge: 2013-04-12 | Disposition: A | Discharge status: Home / Self Care | Payer: Medicare Other | Source: Ambulatory Visit | Attending: Neurosurgery | Admitting: Neurosurgery

## 2013-04-12 VITALS — BP 121/67 | HR 77

## 2013-04-12 DIAGNOSIS — G959 Disease of spinal cord, unspecified: Secondary | ICD-10-CM

## 2013-04-12 DIAGNOSIS — M542 Cervicalgia: Secondary | ICD-10-CM

## 2013-04-12 MED ORDER — DIAZEPAM 5 MG PO TABS
10.0000 mg | ORAL_TABLET | Freq: Once | ORAL | Status: AC
  Administered 2013-04-12: 10 mg via ORAL

## 2013-04-12 MED ORDER — IOHEXOL 300 MG/ML  SOLN
10.0000 mL | Freq: Once | INTRAMUSCULAR | Status: AC | PRN
  Administered 2013-04-12: 10 mL via INTRATHECAL

## 2013-04-12 NOTE — Discharge Instructions (Signed)
Myelogram Discharge Instructions  1. Go home and rest quietly for the next 24 hours.  It is important to lie flat for the next 24 hours.  Get up only to go to the restroom.  You may lie in the bed or on a couch on your back, your stomach, your left side or your right side.  You may have one pillow under your head.  You may have pillows between your knees while you are on your side or under your knees while you are on your back.  2. DO NOT drive today.  Recline the seat as far back as it will go, while still wearing your seat belt, on the way home.  3. You may get up to go to the bathroom as needed.  You may sit up for 10 minutes to eat.  You may resume your normal diet and medications unless otherwise indicated.  Drink lots of extra fluids today and tomorrow.  4. The incidence of headache, nausea, or vomiting is about 5% (one in 20 patients).  If you develop a headache, lie flat and drink plenty of fluids until the headache goes away.  Caffeinated beverages may be helpful.  If you develop severe nausea and vomiting or a headache that does not go away with flat bed rest, call [phone].  5. You may resume normal activities after your 24 hours of bed rest is over; however, do not exert yourself strongly or do any heavy lifting tomorrow. If when you get up you have a headache when standing, go back to bed and force fluids for another 24 hours.  6. Call your physician for a follow-up appointment.  The results of your myelogram will be sent directly to your physician by the following day.  7. If you have any questions or if complications develop after you arrive home, please call [phone].  Discharge instructions have been explained to the patient.  The patient, or the person responsible for the patient, fully understands these instructions.      May resume Mellaril on Jan. 23, 2015, after 11:00 am.

## 2013-04-12 NOTE — Progress Notes (Signed)
Patient states he has been off Mellaril for the past two days.

## 2013-04-24 ENCOUNTER — Other Ambulatory Visit (INDEPENDENT_AMBULATORY_CARE_PROVIDER_SITE_OTHER): Payer: Medicare Other

## 2013-04-24 ENCOUNTER — Other Ambulatory Visit: Payer: Self-pay | Admitting: Family Medicine

## 2013-04-24 DIAGNOSIS — R5383 Other fatigue: Principal | ICD-10-CM

## 2013-04-24 DIAGNOSIS — R5381 Other malaise: Secondary | ICD-10-CM

## 2013-04-24 DIAGNOSIS — Z125 Encounter for screening for malignant neoplasm of prostate: Secondary | ICD-10-CM

## 2013-04-24 DIAGNOSIS — E785 Hyperlipidemia, unspecified: Secondary | ICD-10-CM

## 2013-04-24 DIAGNOSIS — I10 Essential (primary) hypertension: Secondary | ICD-10-CM

## 2013-04-24 LAB — BASIC METABOLIC PANEL WITH GFR
BUN: 10 mg/dL (ref 6–23)
CALCIUM: 9.5 mg/dL (ref 8.4–10.5)
CO2: 28 mEq/L (ref 19–32)
CREATININE: 0.86 mg/dL (ref 0.50–1.35)
Chloride: 103 mEq/L (ref 96–112)
Glucose, Bld: 94 mg/dL (ref 70–99)
Potassium: 3.8 mEq/L (ref 3.5–5.3)
Sodium: 140 mEq/L (ref 135–145)

## 2013-04-24 LAB — HEPATIC FUNCTION PANEL
ALT: 23 U/L (ref 0–53)
AST: 22 U/L (ref 0–37)
Albumin: 4.6 g/dL (ref 3.5–5.2)
Alkaline Phosphatase: 51 U/L (ref 39–117)
BILIRUBIN INDIRECT: 0.9 mg/dL (ref 0.2–1.2)
Bilirubin, Direct: 0.3 mg/dL (ref 0.0–0.3)
TOTAL PROTEIN: 6.6 g/dL (ref 6.0–8.3)
Total Bilirubin: 1.2 mg/dL (ref 0.2–1.2)

## 2013-04-24 LAB — POCT CBC
GRANULOCYTE PERCENT: 63 % (ref 37–80)
HCT, POC: 40 % — AB (ref 43.5–53.7)
HEMOGLOBIN: 13.8 g/dL — AB (ref 14.1–18.1)
Lymph, poc: 1.2 (ref 0.6–3.4)
MCH, POC: 33 pg — AB (ref 27–31.2)
MCHC: 34.6 g/dL (ref 31.8–35.4)
MCV: 95.4 fL (ref 80–97)
MPV: 8.6 fL (ref 0–99.8)
POC GRANULOCYTE: 2.4 (ref 2–6.9)
POC LYMPH PERCENT: 31.7 %L (ref 10–50)
Platelet Count, POC: 144 10*3/uL (ref 142–424)
RBC: 4.2 M/uL — AB (ref 4.69–6.13)
RDW, POC: 12.2 %
WBC: 3.8 10*3/uL — AB (ref 4.6–10.2)

## 2013-04-24 LAB — PSA: PSA: 0.8 ng/mL (ref <=4.00)

## 2013-04-25 LAB — NMR LIPOPROFILE WITH LIPIDS
Cholesterol, Total: 101 mg/dL (ref <200)
HDL Particle Number: 29.8 umol/L — ABNORMAL LOW (ref >=30.5)
HDL Size: 10.1 nm (ref >=9.2)
HDL-C: 48 mg/dL (ref >=40)
LDL CALC: 43 mg/dL (ref <100)
LDL PARTICLE NUMBER: 425 nmol/L (ref <1000)
LDL SIZE: 20.6 nm (ref >20.5)
Large HDL-P: 9.3 umol/L (ref >=4.8)
Large VLDL-P: <0.8 nmol/L (ref <=2.7)
SMALL LDL PARTICLE NUMBER: 125 nmol/L (ref <=527)
Triglycerides: 50 mg/dL (ref <150)
VLDL SIZE: 46 nm (ref <=46.6)

## 2013-04-25 LAB — VITAMIN D 25 HYDROXY (VIT D DEFICIENCY, FRACTURES): Vit D, 25-Hydroxy: 60 ng/mL (ref 30–89)

## 2013-04-26 ENCOUNTER — Other Ambulatory Visit: Payer: Self-pay | Admitting: Family Medicine

## 2013-05-10 ENCOUNTER — Other Ambulatory Visit: Payer: Self-pay | Admitting: Family Medicine

## 2013-05-14 ENCOUNTER — Telehealth: Payer: Self-pay | Admitting: Family Medicine

## 2013-05-21 MED ORDER — FOSINOPRIL SODIUM-HCTZ 10-12.5 MG PO TABS: 1.0000 | ORAL_TABLET | Freq: Every day | ORAL | Status: DC

## 2013-05-21 NOTE — Telephone Encounter (Signed)
done

## 2013-07-16 ENCOUNTER — Other Ambulatory Visit: Payer: Self-pay | Admitting: Family Medicine

## 2013-07-30 ENCOUNTER — Ambulatory Visit (INDEPENDENT_AMBULATORY_CARE_PROVIDER_SITE_OTHER): Payer: Medicare Other | Admitting: Family Medicine

## 2013-07-30 ENCOUNTER — Encounter: Payer: Self-pay | Admitting: Family Medicine

## 2013-07-30 VITALS — BP 108/66 | HR 72 | Temp 98.4°F | Ht 73.0 in | Wt 181.0 lb

## 2013-07-30 DIAGNOSIS — E8881 Metabolic syndrome: Secondary | ICD-10-CM

## 2013-07-30 DIAGNOSIS — E559 Vitamin D deficiency, unspecified: Secondary | ICD-10-CM

## 2013-07-30 DIAGNOSIS — G20C Parkinsonism, unspecified: Secondary | ICD-10-CM

## 2013-07-30 DIAGNOSIS — E785 Hyperlipidemia, unspecified: Secondary | ICD-10-CM

## 2013-07-30 DIAGNOSIS — F209 Schizophrenia, unspecified: Secondary | ICD-10-CM

## 2013-07-30 DIAGNOSIS — N400 Enlarged prostate without lower urinary tract symptoms: Secondary | ICD-10-CM

## 2013-07-30 DIAGNOSIS — G20 Parkinson's disease: Secondary | ICD-10-CM

## 2013-07-30 DIAGNOSIS — I10 Essential (primary) hypertension: Secondary | ICD-10-CM

## 2013-07-30 LAB — POCT CBC
Granulocyte percent: 58.4 %G (ref 37–80)
HEMATOCRIT: 40.2 % — AB (ref 43.5–53.7)
Hemoglobin: 13.1 g/dL — AB (ref 14.1–18.1)
Lymph, poc: 1.4 (ref 0.6–3.4)
MCH: 30.7 pg (ref 27–31.2)
MCHC: 32.5 g/dL (ref 31.8–35.4)
MCV: 94.2 fL (ref 80–97)
MPV: 7.7 fL (ref 0–99.8)
POC Granulocyte: 2.3 (ref 2–6.9)
POC LYMPH PERCENT: 34.6 %L (ref 10–50)
Platelet Count, POC: 169 10*3/uL (ref 142–424)
RBC: 4.3 M/uL — AB (ref 4.69–6.13)
RDW, POC: 12.1 %
WBC: 4 10*3/uL — AB (ref 4.6–10.2)

## 2013-07-30 LAB — POCT GLYCOSYLATED HEMOGLOBIN (HGB A1C): Hemoglobin A1C: 5.1

## 2013-07-30 NOTE — Progress Notes (Signed)
Subjective:    Patient ID: Mark Benjamin, male    DOB: 1960-08-13, 53 y.o.   MRN: [id number]  HPI Pt here for follow up and management of chronic medical problems.         Patient Active Problem List   Diagnosis Date Noted  . Metabolic syndrome 70/78/6754  . Hypertension 04/02/2013  . Hyperlipidemia 04/02/2013  . BPH (benign prostatic hyperplasia) 04/02/2013  . Left arm weakness 11/21/2012  . Parkinsonism 11/21/2012  . Neck pain 11/21/2012  . Colon adenomas 06/29/2012  . Rectal mass 06/29/2012  . Schizophrenia 06/22/2012  . Dysphagia 06/22/2011  . Eosinophilic esophagitis 49/20/1007   Outpatient Encounter Prescriptions as of 07/30/2013  Medication Sig  . Cholecalciferol (VITAMIN D3) 1000 UNITS CAPS Take 3 tablets by mouth daily.   . CRESTOR 20 MG tablet TAKE 1 TABLET BY MOUTH ONCE A DAY  . fosinopril-hydrochlorothiazide (MONOPRIL-HCT) 10-12.5 MG per tablet Take 1 tablet by mouth daily.  . niacin (NIASPAN) 1000 MG CR tablet TAKE 1 TABLET (1,000 MG TOTAL) BY MOUTH AT BEDTIME.  Marland Kitchen omega-3 acid ethyl esters (LOVAZA) 1 G capsule TAKE 2 CAPSULES (2 G TOTAL) BY MOUTH 2 (TWO) TIMES DAILY.  Marland Kitchen pioglitazone (ACTOS) 15 MG tablet TAKE 1 TABLET BY MOUTH ONCE A DAY  . thioridazine (MELLARIL) 100 MG tablet Take 200 mg by mouth at bedtime.   . [DISCONTINUED] pioglitazone (ACTOS) 30 MG tablet Take 15 mg by mouth daily.     Review of Systems  Constitutional: Negative.   HENT: Negative.   Eyes: Positive for pain (dry eye- went to eye dr).  Respiratory: Negative.   Cardiovascular: Negative.   Gastrointestinal: Negative.   Endocrine: Negative.   Genitourinary: Negative.   Musculoskeletal: Positive for myalgias. Joint swelling: left arm weakness x 2 yrs- seen 2 neurosurgeons for this.  Skin: Negative.   Allergic/Immunologic: Negative.   Neurological: Negative.   Hematological: Negative.   Psychiatric/Behavioral: Negative.        Objective:   Physical Exam  Nursing note and vitals  reviewed. Constitutional: He is oriented to person, place, and time. He appears well-developed and well-nourished. No distress.  HENT:  Head: Normocephalic and atraumatic.  Right Ear: External ear normal.  Left Ear: External ear normal.  Nose: Nose normal.  Mouth/Throat: Oropharynx is clear and moist. No oropharyngeal exudate.  Eyes: Conjunctivae and EOM are normal. Pupils are equal, round, and reactive to light. Right eye exhibits no discharge. Left eye exhibits no discharge. No scleral icterus.  Neck: Normal range of motion. Neck supple. No thyromegaly present.  No carotid bruits  Cardiovascular: Normal rate, regular rhythm, normal heart sounds and intact distal pulses.  Exam reveals no gallop and no friction rub.   No murmur heard. At 72 per minute  Pulmonary/Chest: Effort normal and breath sounds normal. No respiratory distress. He has no wheezes. He has no rales. He exhibits no tenderness.  Abdominal: Soft. Bowel sounds are normal. He exhibits no mass. There is no tenderness. There is no rebound and no guarding.  Musculoskeletal: Normal range of motion. He exhibits no edema and no tenderness.  Lymphadenopathy:    He has no cervical adenopathy.  Neurological: He is alert and oriented to person, place, and time. He has normal reflexes.  Slowness in motion and staring facies, stiffness in left upper extremity, minimal tremor , the patient is unable to un button and button his shirt and anesthesia were  Skin: Skin is warm and dry. No rash noted.  Psychiatric: He has  a normal mood and affect. His behavior is normal. Judgment and thought content normal.   BP 108/66  Pulse 72  Temp(Src) 98.4 F (36.9 C) (Oral)  Ht '6\' 1"'  (1.854 m)  Wt 181 lb (82.101 kg)  BMI 23.89 kg/m2        Assessment & Plan:  1. BPH (benign prostatic hyperplasia) - POCT CBC  2. Hyperlipidemia - POCT CBC - NMR, lipoprofile  3. Hypertension - POCT CBC - BMP8+EGFR - Hepatic function panel  4. Metabolic  syndrome - POCT CBC - POCT glycosylated hemoglobin (Hb A1C)  5. Parkinsonism - POCT CBC  6. Schizophrenia - POCT CBC  7. Vitamin D deficiency - Vit D  25 hydroxy (rtn osteoporosis monitoring)  Patient Instructions  Continue current medications. Continue good therapeutic lifestyle changes which include good diet and exercise. Fall precautions discussed with patient. If an FOBT was given today- please return it to our front desk. If you are over 41 years old - you may need Prevnar 51 or the adult Pneumonia vaccine.  Keep followup appointment with neurologist Discussed with him the possibility of using Lyrica Exercise more frequently, walking Continue to drink plenty of fluid You can purchase Debrox over-the-counter and use 2-3 drops nightly in the right ear canal for 2-3 nights wait one week and repeat Call you with the lab work results once those results are unavailable   Arrie Senate MD

## 2013-07-30 NOTE — Patient Instructions (Addendum)
Continue current medications. Continue good therapeutic lifestyle changes which include good diet and exercise. Fall precautions discussed with patient. If an FOBT was given today- please return it to our front desk. If you are over 53 years old - you may need Prevnar 45 or the adult Pneumonia vaccine.  Keep followup appointment with neurologist Discussed with him the possibility of using Lyrica Exercise more frequently, walking Continue to drink plenty of fluid You can purchase Debrox over-the-counter and use 2-3 drops nightly in the right ear canal for 2-3 nights wait one week and repeat Call you with the lab work results once those results are unavailable

## 2013-07-31 LAB — NMR, LIPOPROFILE
Cholesterol: 97 mg/dL — ABNORMAL LOW (ref 100–199)
HDL Cholesterol by NMR: 46 mg/dL (ref >39)
HDL PARTICLE NUMBER: 27.7 umol/L — AB (ref >=30.5)
LDL Particle Number: 410 nmol/L (ref <1000)
LDL Size: 20.6 nm (ref >20.5)
LDLC SERPL CALC-MCNC: 41 mg/dL (ref 0–99)
LP-IR SCORE: 41 (ref <=45)
Small LDL Particle Number: 212 nmol/L (ref <=527)
Triglycerides by NMR: 51 mg/dL (ref 0–149)

## 2013-07-31 LAB — HEPATIC FUNCTION PANEL
ALK PHOS: 55 IU/L (ref 39–117)
ALT: 28 IU/L (ref 0–44)
AST: 26 IU/L (ref 0–40)
Albumin: 4.7 g/dL (ref 3.5–5.5)
BILIRUBIN DIRECT: 0.23 mg/dL (ref 0.00–0.40)
Total Bilirubin: 1.9 mg/dL — ABNORMAL HIGH (ref 0.0–1.2)
Total Protein: 6.5 g/dL (ref 6.0–8.5)

## 2013-07-31 LAB — BMP8+EGFR
BUN/Creatinine Ratio: 10 (ref 9–20)
BUN: 9 mg/dL (ref 6–24)
CHLORIDE: 98 mmol/L (ref 97–108)
CO2: 26 mmol/L (ref 18–29)
Calcium: 9.5 mg/dL (ref 8.7–10.2)
Creatinine, Ser: 0.9 mg/dL (ref 0.76–1.27)
GFR calc Af Amer: 112 mL/min/{1.73_m2} (ref >59)
GFR calc non Af Amer: 97 mL/min/{1.73_m2} (ref >59)
Glucose: 91 mg/dL (ref 65–99)
POTASSIUM: 3.8 mmol/L (ref 3.5–5.2)
Sodium: 139 mmol/L (ref 134–144)

## 2013-07-31 LAB — VITAMIN D 25 HYDROXY (VIT D DEFICIENCY, FRACTURES): VIT D 25 HYDROXY: 32.3 ng/mL (ref 30.0–100.0)

## 2013-08-07 ENCOUNTER — Other Ambulatory Visit: Payer: Self-pay | Admitting: Family Medicine

## 2013-08-09 ENCOUNTER — Other Ambulatory Visit: Payer: Medicare Other

## 2013-08-09 DIAGNOSIS — Z1212 Encounter for screening for malignant neoplasm of rectum: Secondary | ICD-10-CM

## 2013-08-09 NOTE — Progress Notes (Signed)
Patient dropped off fobt 

## 2013-08-10 ENCOUNTER — Ambulatory Visit (INDEPENDENT_AMBULATORY_CARE_PROVIDER_SITE_OTHER): Payer: Medicare Other | Admitting: Nurse Practitioner

## 2013-08-10 ENCOUNTER — Encounter: Payer: Self-pay | Admitting: Nurse Practitioner

## 2013-08-10 VITALS — BP 124/76 | HR 89 | Ht 73.0 in | Wt 183.0 lb

## 2013-08-10 DIAGNOSIS — M4802 Spinal stenosis, cervical region: Secondary | ICD-10-CM

## 2013-08-10 DIAGNOSIS — G20C Parkinsonism, unspecified: Secondary | ICD-10-CM

## 2013-08-10 DIAGNOSIS — R29898 Other symptoms and signs involving the musculoskeletal system: Secondary | ICD-10-CM

## 2013-08-10 DIAGNOSIS — G20 Parkinson's disease: Secondary | ICD-10-CM

## 2013-08-10 DIAGNOSIS — R292 Abnormal reflex: Secondary | ICD-10-CM

## 2013-08-10 DIAGNOSIS — M542 Cervicalgia: Secondary | ICD-10-CM

## 2013-08-10 DIAGNOSIS — G20A1 Parkinson's disease without dyskinesia, without mention of fluctuations: Secondary | ICD-10-CM

## 2013-08-10 LAB — FECAL OCCULT BLOOD, IMMUNOCHEMICAL: FECAL OCCULT BLD: NEGATIVE

## 2013-08-10 NOTE — Progress Notes (Signed)
PATIENT: Mark Benjamin DOB: 03/17/1961  REASON FOR VISIT: follow up for Parkinsonism, cervical stenosis HISTORY FROM: patient  HISTORY OF PRESENT ILLNESS: UPDATE 08/10/13 (LL): Patient states that he was evaluated by Dr. Vertell Limber for cervical stenosis and was told that there was nothing seen on Myelogram that needed surgery.  His Thioridazine dose is now 100 mg.  Symptoms are stable.  He was advised by PCP to cut out artificial sweeteners to see if that changed his symptoms; he did so 2 weeks ago. He complains of mild left leg weakness.  UPDATE 02/26/13: Since last visit patient had MRI of the brain and cervical spine which shows moderate spinal stenosis at C5-6 level with severe bilateral foraminal stenosis. Also mild spinal stenosis and moderate right foraminal stenosis at C6-7. No cord signal abnormalities. Since last visit thioridazine dosing has been reduced. Overall his symptoms are stable to slightly progressed.  PRIOR HPI (11/21/12): 53 year old left-handed male with history of diabetes, hypercholesterolemia, schizophrenia, on thioridazine since 1986, here for evaluation of left arm weakness and numbness since March 2014.  Patient reports gradual onset, progressive numbness and weakness and slowness of his left arm. He denies any symptoms in his right arm or legs. No facial droop or facial numbness. He has noticed some slurred speech over the past one week. Patient is left-handed and has noticed difficulty with brushing his teeth with his left hand, handwriting and other fine movements.  Patient's psychiatrist reduced thioridazine dose from 300 mg at bedtime down to 200 mg at bedtime last month, out of consideration of the patient's presenting symptoms as a potential side effect of medication. Since reducing the dose, no change in symptoms.   REVIEW OF SYSTEMS: Full 14 system review of systems performed and notable only for numbness these difficulty and left arm weakness.  ALLERGIES: Allergies   Allergen Reactions  . Erythromycin   . Zocor [Simvastatin]     HOME MEDICATIONS: Outpatient Prescriptions Prior to Visit  Medication Sig Dispense Refill  . Cholecalciferol (VITAMIN D3) 1000 UNITS CAPS Take 3 tablets by mouth daily.       . CRESTOR 20 MG tablet TAKE 1 TABLET BY MOUTH ONCE A DAY  90 tablet  1  . fosinopril-hydrochlorothiazide (MONOPRIL-HCT) 10-12.5 MG per tablet Take 1 tablet by mouth daily.  90 tablet  0  . niacin (NIASPAN) 1000 MG CR tablet TAKE 1 TABLET (1,000 MG TOTAL) BY MOUTH AT BEDTIME.  30 tablet  5  . omega-3 acid ethyl esters (LOVAZA) 1 G capsule TAKE 2 CAPSULES (2 G TOTAL) BY MOUTH 2 (TWO) TIMES DAILY.  120 capsule  2  . pioglitazone (ACTOS) 15 MG tablet TAKE 1 TABLET BY MOUTH ONCE A DAY  90 tablet  0  . thioridazine (MELLARIL) 100 MG tablet Take 200 mg by mouth at bedtime.        No facility-administered medications prior to visit.     PHYSICAL EXAM  Filed Vitals:   08/10/13 1008  BP: 124/76  Pulse: 89  Height: 6\' 1"  (1.854 m)  Weight: 183 lb (83.008 kg)   Body mass index is 24.15 kg/(m^2). No exam data present   GENERAL EXAM:  Patient is in no distress  CARDIOVASCULAR:  Regular rate and rhythm, no murmurs, no carotid bruits   NEUROLOGIC:  MENTAL STATUS: awake, alert, language fluent, comprehension intact, naming intact; MASKED FACIES.  CRANIAL NERVE: no papilledema on fundoscopic exam, pupils equal and reactive to light, visual fields full to confrontation, extraocular muscles intact,  no nystagmus, facial sensation and strength symmetric, uvula midline, shoulder shrug symmetric, tongue midline.  MOTOR: INCREASED TONE IN LUE WITH RARE, FINE REST TREMOR. SIGNIFICANT BRADYKINESIA IN LUE. RUE AND BLE 5. LUE FINGER ABDUCTION AND EXTENSION 4.  SENSORY: normal and symmetric to light touch, pinprick, temperature, vibration  COORDINATION: finger-nose-finger, fine finger movements normal  REFLEXES: RUE 3, LUE 4, KNEES 3, ANKLES 2.  GAIT/STATION: narrow  based gait; STOOPED POSTURE. SMALL STEPS. ABSENT ARM SWING.   11/29/12 MRI cervical  1. At C5-6: disc bulging with moderate spinal stenosis and severe biforaminal stenosis  2. At C3-4, C4-5, C6-7: disc bulging with mild spinal stenosis and severe biforaminal stenosis  3. No cord signal abnormalities.   11/29/12 MRI brain (without) demonstrating:  1. Single right frontal subcortical focus (31mm) of non-specific gliosis.  2. No acute findings.  ASSESSMENT AND PLAN  53 y.o. year old male here with gradual onset, progressive left arm weakness, slowness and stiffness with some numbness. On exam, patient has masked facies, rigidity in the left arm, bradykinesia in the left arm, stooped posture and poor arm swing. Also with hyperreflexia in BUE and BLE.   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 Follow up in 6 months with Dr. Leta Baptist.  Philmore Pali, MSN, NP-C 08/10/2013, 10:22 AM [facility] [address], [place], [place] [address] [phone]  Note: This document was prepared with digital dictation and possible smart phrase technology. Any transcriptional errors that result from this process are unintentional.

## 2013-08-10 NOTE — Patient Instructions (Signed)
Continue to take watchful waiting approach to symptoms, continue to avoid artificial sweeteners.  Follow up in 6 months with Dr. Leta Baptist.

## 2013-08-31 NOTE — Progress Notes (Signed)
I reviewed note and agree with plan.   VIKRAM R. PENUMALLI, MD  Certified in Neurology, Neurophysiology and Neuroimaging  [facility] [address], [address] [place], [place] [address] [phone]   

## 2013-09-12 ENCOUNTER — Other Ambulatory Visit: Payer: Self-pay | Admitting: Family Medicine

## 2013-10-08 ENCOUNTER — Other Ambulatory Visit (INDEPENDENT_AMBULATORY_CARE_PROVIDER_SITE_OTHER): Payer: Medicare Other

## 2013-10-08 DIAGNOSIS — R7989 Other specified abnormal findings of blood chemistry: Secondary | ICD-10-CM

## 2013-10-08 DIAGNOSIS — E785 Hyperlipidemia, unspecified: Secondary | ICD-10-CM

## 2013-10-08 DIAGNOSIS — I10 Essential (primary) hypertension: Secondary | ICD-10-CM

## 2013-10-09 LAB — HEPATIC FUNCTION PANEL
ALK PHOS: 68 IU/L (ref 39–117)
ALT: 27 IU/L (ref 0–44)
AST: 27 IU/L (ref 0–40)
Albumin: 4.5 g/dL (ref 3.5–5.5)
Bilirubin, Direct: 0.32 mg/dL (ref 0.00–0.40)
TOTAL PROTEIN: 6.7 g/dL (ref 6.0–8.5)
Total Bilirubin: 1.7 mg/dL — ABNORMAL HIGH (ref 0.0–1.2)

## 2013-10-28 ENCOUNTER — Other Ambulatory Visit: Payer: Self-pay | Admitting: Nurse Practitioner

## 2013-11-06 ENCOUNTER — Other Ambulatory Visit: Payer: Self-pay | Admitting: Family Medicine

## 2013-11-08 ENCOUNTER — Encounter: Payer: Self-pay | Admitting: Family Medicine

## 2013-11-08 ENCOUNTER — Ambulatory Visit (INDEPENDENT_AMBULATORY_CARE_PROVIDER_SITE_OTHER): Payer: Medicare Other | Admitting: Family Medicine

## 2013-11-08 VITALS — BP 139/79 | HR 80 | Temp 99.1°F | Ht 73.0 in | Wt 192.0 lb

## 2013-11-08 DIAGNOSIS — F209 Schizophrenia, unspecified: Secondary | ICD-10-CM

## 2013-11-08 DIAGNOSIS — E785 Hyperlipidemia, unspecified: Secondary | ICD-10-CM

## 2013-11-08 DIAGNOSIS — E8881 Metabolic syndrome: Secondary | ICD-10-CM

## 2013-11-08 DIAGNOSIS — G20 Parkinson's disease: Secondary | ICD-10-CM

## 2013-11-08 DIAGNOSIS — I10 Essential (primary) hypertension: Secondary | ICD-10-CM

## 2013-11-08 DIAGNOSIS — R208 Other disturbances of skin sensation: Secondary | ICD-10-CM

## 2013-11-08 DIAGNOSIS — N400 Enlarged prostate without lower urinary tract symptoms: Secondary | ICD-10-CM

## 2013-11-08 DIAGNOSIS — E559 Vitamin D deficiency, unspecified: Secondary | ICD-10-CM

## 2013-11-08 DIAGNOSIS — K6289 Other specified diseases of anus and rectum: Secondary | ICD-10-CM

## 2013-11-08 LAB — POCT CBC
Granulocyte percent: 68.8 %G (ref 37–80)
HEMATOCRIT: 45.6 % (ref 43.5–53.7)
Hemoglobin: 15.6 g/dL (ref 14.1–18.1)
Lymph, poc: 1.3 (ref 0.6–3.4)
MCH, POC: 31.7 pg — AB (ref 27–31.2)
MCHC: 34.1 g/dL (ref 31.8–35.4)
MCV: 92.9 fL (ref 80–97)
MPV: 7.5 fL (ref 0–99.8)
POC Granulocyte: 3.2 (ref 2–6.9)
POC LYMPH PERCENT: 28.6 %L (ref 10–50)
Platelet Count, POC: 193 10*3/uL (ref 142–424)
RBC: 4.9 M/uL (ref 4.69–6.13)
RDW, POC: 12.1 %
WBC: 4.6 10*3/uL (ref 4.6–10.2)

## 2013-11-08 LAB — POCT GLYCOSYLATED HEMOGLOBIN (HGB A1C): Hemoglobin A1C: 4.8

## 2013-11-08 MED ORDER — HYDROCORTISONE 2.5 % RE CREA: 1.0000 "application " | TOPICAL_CREAM | Freq: Two times a day (BID) | RECTAL | Status: DC

## 2013-11-08 NOTE — Progress Notes (Signed)
Subjective:    Patient ID: Mark Benjamin, male    DOB: August 21, 1960, 53 y.o.   MRN: [id number]  HPI Pt here for follow up and management of chronic medical problems. The patient is doing well overall. He continues to see his psychiatrist at the [facility]. He does complain of some hemorrhoid problems. The hemorrhoids have been giving him problems off and on for a good while but especially over the past 2 weeks. He indicates that his burning discomfort and pain during the day but that this is better by the time he goes to bed at night. He denies any constipation or blood in the stool. His last colonoscopy was about 2 years ago. He will get lab work today. He will need to check with his insurance regarding the Prevnar vaccine. He sees a physician at mental health every 3-4 months at the next visit is scheduled for September. He also sees the neurologist and they are working on reducing his Mellaril and believe that that is the cause of his Parkinson like symptoms.         Patient Active Problem List   Diagnosis Date Noted  . Metabolic syndrome 50/27/7412  . Hypertension 04/02/2013  . Hyperlipidemia 04/02/2013  . BPH (benign prostatic hyperplasia) 04/02/2013  . Left arm weakness 11/21/2012  . Parkinsonism 11/21/2012  . Neck pain 11/21/2012  . Colon adenomas 06/29/2012  . Rectal mass 06/29/2012  . Schizophrenia 06/22/2012  . Dysphagia 06/22/2011  . Eosinophilic esophagitis 87/86/7672   Outpatient Encounter Prescriptions as of 11/08/2013  Medication Sig  . Cholecalciferol (VITAMIN D3) 1000 UNITS CAPS Take 3 tablets by mouth daily.   . CRESTOR 20 MG tablet TAKE 1 TABLET BY MOUTH ONCE A DAY  . fosinopril-hydrochlorothiazide (MONOPRIL-HCT) 10-12.5 MG per tablet TAKE 1 TABLET BY MOUTH DAILY.  . niacin (NIASPAN) 1000 MG CR tablet TAKE 1 TABLET (1,000 MG TOTAL) BY MOUTH AT BEDTIME.  Marland Kitchen omega-3 acid ethyl esters (LOVAZA) 1 G capsule TAKE 2 CAPSULES (2 G TOTAL) BY MOUTH 2 (TWO) TIMES  DAILY.  Marland Kitchen pioglitazone (ACTOS) 15 MG tablet TAKE 1 TABLET BY MOUTH ONCE A DAY  . thioridazine (MELLARIL) 100 MG tablet Take 50 mg by mouth at bedtime.     Review of Systems  Constitutional: Negative.   HENT: Negative.   Eyes: Negative.   Respiratory: Negative.   Cardiovascular: Negative.   Gastrointestinal: Negative.   Endocrine: Negative.   Genitourinary: Negative.        Hemorrhoids   Musculoskeletal: Negative.   Skin: Negative.   Allergic/Immunologic: Negative.   Neurological: Negative.   Hematological: Negative.   Psychiatric/Behavioral: Negative.        Objective:   Physical Exam  Nursing note and vitals reviewed. Constitutional: He is oriented to person, place, and time. He appears well-developed and well-nourished. No distress.  The patient is alert, but continues to have a somewhat flat affect and stiffness with movement.  HENT:  Head: Normocephalic and atraumatic.  Right Ear: External ear normal.  Left Ear: External ear normal.  Mouth/Throat: Oropharynx is clear and moist. No oropharyngeal exudate.  There is nasal congestion bilaterally left greater than right  Eyes: Conjunctivae and EOM are normal. Pupils are equal, round, and reactive to light. Right eye exhibits no discharge. Left eye exhibits no discharge. No scleral icterus.  Neck: Normal range of motion. Neck supple. No thyromegaly present.  No anterior cervical nodes or carotid bruits  Cardiovascular: Normal rate and regular rhythm.   No murmur  heard. At 84 per minute  Pulmonary/Chest: Effort normal and breath sounds normal. He has no wheezes. He has no rales. He exhibits no tenderness.  No axillary adenopathy  Abdominal: Soft. Bowel sounds are normal. He exhibits no mass. There is no tenderness. There is no rebound and no guarding.  Genitourinary: Rectum normal.  There were no protruding hemorrhoids. The prostate was slightly enlarged. There was rectal discomfort with doing a rectal exam but no obvious  masses.  Musculoskeletal: Normal range of motion. He exhibits no edema and no tenderness.  Lymphadenopathy:    He has no cervical adenopathy.  Neurological: He is alert and oriented to person, place, and time. He has normal reflexes. No cranial nerve deficit.  The patient has general rigidity and a depressed affect.  Skin: Skin is warm and dry. No rash noted. No erythema. No pallor.  Psychiatric: His behavior is normal. Judgment and thought content normal.  Mood and affect were flat but this is no different from the past   BP 139/79  Pulse 80  Temp(Src) 99.1 F (37.3 C) (Oral)  Ht _0  (1.854 m)  Wt 192 lb (87.091 kg)  BMI 25.34 kg/m2        Assessment & Plan:  1. Essential hypertension - POCT CBC - BMP8+EGFR - Hepatic function panel  2. Hyperlipidemia - POCT CBC - NMR, lipoprofile  3. BPH (benign prostatic hyperplasia) - POCT CBC  4. Schizophrenia, unspecified type - POCT CBC -Continue followup with mental health  5. Parkinsonism - POCT CBC -Continue followup with neurology  6. Metabolic syndrome - POCT CBC - POCT glycosylated hemoglobin (Hb A1C)  7. Vitamin D deficiency - Vit D  25 hydroxy (rtn osteoporosis monitoring)  8. Rectal burning -ProctoCream-HC  Meds ordered this encounter  Medications  . hydrocortisone (PROCTOSOL HC) 2.5 % rectal cream    Sig: Place 1 application rectally 2 (two) times daily.    Dispense:  30 g    Refill:  1   Patient Instructions                       Medicare Annual Wellness Visit  [facility] and the medical providers at [facility] strive to bring you the best medical care.  In doing so we not only want to address your current medical conditions and concerns but also to detect new conditions early and prevent illness, disease and health-related problems.    Medicare offers a yearly Wellness Visit which allows our clinical staff to assess your need for preventative services including  immunizations, lifestyle education, counseling to decrease risk of preventable diseases and screening for fall risk and other medical concerns.    This visit is provided free of charge (no copay) for all Medicare recipients. The clinical pharmacists at [facility] have begun to conduct these Wellness Visits which will also include a thorough review of all your medications.    As you primary medical provider recommend that you make an appointment for your Annual Wellness Visit if you have not done so already this year.  You may set up this appointment before you leave today or you may call back ([phone]) and schedule an appointment.  Please make sure when you call that you mention that you are scheduling your Annual Wellness Visit with the clinical pharmacist so that the appointment may be made for the proper length of time.     Continue current medications. Continue good therapeutic lifestyle changes which  include good diet and exercise. Fall precautions discussed with patient. If an FOBT was given today- please return it to our front desk. If you are over 36 years old - you may need Prevnar 49 or the adult Pneumonia vaccine.  Flu Shots will be available at our office starting mid- September. Please call and schedule a FLU CLINIC APPOINTMENT.   Use hemorrhoid cream as directed Call in a couple weeks if your symptoms are not improved Drink plenty of water Continued followup with mental health   Arrie Senate MD

## 2013-11-08 NOTE — Patient Instructions (Addendum)
Medicare Annual Wellness Visit  [facility] and the medical providers at [facility] strive to bring you the best medical care.  In doing so we not only want to address your current medical conditions and concerns but also to detect new conditions early and prevent illness, disease and health-related problems.    Medicare offers a yearly Wellness Visit which allows our clinical staff to assess your need for preventative services including immunizations, lifestyle education, counseling to decrease risk of preventable diseases and screening for fall risk and other medical concerns.    This visit is provided free of charge (no copay) for all Medicare recipients. The clinical pharmacists at [facility] have begun to conduct these Wellness Visits which will also include a thorough review of all your medications.    As you primary medical provider recommend that you make an appointment for your Annual Wellness Visit if you have not done so already this year.  You may set up this appointment before you leave today or you may call back ([phone]) and schedule an appointment.  Please make sure when you call that you mention that you are scheduling your Annual Wellness Visit with the clinical pharmacist so that the appointment may be made for the proper length of time.     Continue current medications. Continue good therapeutic lifestyle changes which include good diet and exercise. Fall precautions discussed with patient. If an FOBT was given today- please return it to our front desk. If you are over 86 years old - you may need Prevnar 74 or the adult Pneumonia vaccine.  Flu Shots will be available at our office starting mid- September. Please call and schedule a FLU CLINIC APPOINTMENT.   Use hemorrhoid cream as directed Call in a couple weeks if your symptoms are not improved Drink plenty of water Continued followup with mental  health Please check with your insurance regarding the Prevnar vaccine and the shingles shot

## 2013-11-09 LAB — NMR, LIPOPROFILE
Cholesterol: 114 mg/dL (ref 100–199)
HDL Cholesterol by NMR: 49 mg/dL (ref >39)
HDL Particle Number: 33.4 umol/L (ref >=30.5)
LDL PARTICLE NUMBER: 624 nmol/L (ref <1000)
LDL SIZE: 21.1 nm (ref >20.5)
LDLC SERPL CALC-MCNC: 52 mg/dL (ref 0–99)
LP-IR Score: 26 (ref <=45)
Small LDL Particle Number: 191 nmol/L (ref <=527)
Triglycerides by NMR: 65 mg/dL (ref 0–149)

## 2013-11-09 LAB — BMP8+EGFR
BUN / CREAT RATIO: 7 — AB (ref 9–20)
BUN: 6 mg/dL (ref 6–24)
CHLORIDE: 96 mmol/L — AB (ref 97–108)
CO2: 29 mmol/L (ref 18–29)
Calcium: 9.4 mg/dL (ref 8.7–10.2)
Creatinine, Ser: 0.91 mg/dL (ref 0.76–1.27)
GFR, EST AFRICAN AMERICAN: 111 mL/min/{1.73_m2} (ref >59)
GFR, EST NON AFRICAN AMERICAN: 96 mL/min/{1.73_m2} (ref >59)
Glucose: 101 mg/dL — ABNORMAL HIGH (ref 65–99)
POTASSIUM: 3.9 mmol/L (ref 3.5–5.2)
SODIUM: 137 mmol/L (ref 134–144)

## 2013-11-09 LAB — VITAMIN D 25 HYDROXY (VIT D DEFICIENCY, FRACTURES): VIT D 25 HYDROXY: 42.5 ng/mL (ref 30.0–100.0)

## 2013-11-09 LAB — HEPATIC FUNCTION PANEL
ALK PHOS: 68 IU/L (ref 39–117)
ALT: 28 IU/L (ref 0–44)
AST: 28 IU/L (ref 0–40)
Albumin: 4.9 g/dL (ref 3.5–5.5)
BILIRUBIN DIRECT: 0.35 mg/dL (ref 0.00–0.40)
Total Bilirubin: 1.8 mg/dL — ABNORMAL HIGH (ref 0.0–1.2)
Total Protein: 6.8 g/dL (ref 6.0–8.5)

## 2013-11-12 ENCOUNTER — Other Ambulatory Visit: Payer: Self-pay | Admitting: Family Medicine

## 2013-11-20 ENCOUNTER — Telehealth: Payer: Self-pay | Admitting: Family Medicine

## 2013-11-20 NOTE — Telephone Encounter (Signed)
Continue the cream, use stool softener like MiraLax, do tub soaks--- refer patient to surgeon for evaluation and proctoscopic exam

## 2013-11-20 NOTE — Telephone Encounter (Signed)
Hemorrhoids no better and using cream. What to do next?

## 2013-11-21 ENCOUNTER — Other Ambulatory Visit: Payer: Self-pay | Admitting: Family Medicine

## 2013-11-21 NOTE — Telephone Encounter (Signed)
Patient aware will call us back if this dosent help to set up referral to specialist.

## 2013-11-22 ENCOUNTER — Telehealth: Payer: Self-pay | Admitting: Family Medicine

## 2013-11-22 NOTE — Telephone Encounter (Signed)
He is using hemorrhoid cream and has done one sitz bath today. This helped. Encouraged him to do sitz baths as often as necessary for comfort and to continue with cream. Appt scheduled for tomorrow with Dietrich Pates, FNP. No available appts with Dr Laurance Flatten until next week.

## 2013-11-23 ENCOUNTER — Encounter: Payer: Self-pay | Admitting: Family Medicine

## 2013-11-23 ENCOUNTER — Ambulatory Visit (INDEPENDENT_AMBULATORY_CARE_PROVIDER_SITE_OTHER): Payer: Medicare Other | Admitting: Family Medicine

## 2013-11-23 VITALS — BP 140/78 | HR 80 | Temp 97.8°F | Ht 73.0 in | Wt 190.2 lb

## 2013-11-23 DIAGNOSIS — K649 Unspecified hemorrhoids: Secondary | ICD-10-CM

## 2013-11-23 MED ORDER — HYDROCORTISONE 2.5 % RE CREA: 1.0000 "application " | TOPICAL_CREAM | Freq: Two times a day (BID) | RECTAL | Status: DC

## 2013-11-23 NOTE — Progress Notes (Signed)
   Subjective:    Patient ID: Mark Benjamin, male    DOB: 1960-05-08, 53 y.o.   MRN: [id number]  HPI  Patient has been having some rectal pruritis and bleeding.  He has hx of hemorrhoids.  Review of Systems    No chest pain, SOB, HA, dizziness, vision change, N/V, diarrhea, constipation, dysuria, urinary urgency or frequency, myalgias, arthralgias or rash.  Objective:   Physical Exam  Vital signs noted  Well developed well nourished male.  HEENT - Head atraumatic Normocephalic Respiratory - Lungs CTA bilateral Cardiac - RRR S1 and S2 without murmur GI - Abdomen soft Nontender and bowel sounds active x 4      Assessment & Plan:  Hemorrhoids, unspecified hemorrhoid type - Plan: hydrocortisone (PROCTOSOL HC) 2.5 % rectal cream  Lysbeth Penner FNP

## 2013-12-13 ENCOUNTER — Other Ambulatory Visit: Payer: Self-pay | Admitting: Family Medicine

## 2014-01-28 ENCOUNTER — Telehealth: Payer: Self-pay | Admitting: Family Medicine

## 2014-01-28 NOTE — Telephone Encounter (Signed)
[name] - sister is wanting to relocate here for work -she needs reasoning which could include...  Due to helping her brother - disabled adult: Could [facility] write up some kind of note/ statement  Saying:  Patient (NAME) is a pt of [facility] and that due to his disability , we feel it is in out pt's best interest that he have a sibling/ family member in close proximity to him.   (we do not have to list dx)      thurs. please fax nancy the note- confidential

## 2014-01-28 NOTE — Telephone Encounter (Signed)
This is okay to write this note. Discuss with me about the contents.

## 2014-01-31 ENCOUNTER — Telehealth: Payer: Self-pay | Admitting: Family Medicine

## 2014-01-31 NOTE — Telephone Encounter (Signed)
Appt given for tomorrow per patient request

## 2014-01-31 NOTE — Telephone Encounter (Signed)
Sister was faxed a copy of letter this morning and a copy to be scanned to [employer]

## 2014-02-01 ENCOUNTER — Ambulatory Visit: Payer: Medicare Other | Admitting: Family Medicine

## 2014-02-05 ENCOUNTER — Ambulatory Visit: Payer: Medicare Other | Admitting: Diagnostic Neuroimaging

## 2014-02-05 ENCOUNTER — Telehealth: Payer: Self-pay | Admitting: Family Medicine

## 2014-02-05 NOTE — Telephone Encounter (Signed)
Pt given appt with dr Laurance Flatten 11/24 @ 11:15

## 2014-02-11 ENCOUNTER — Encounter: Payer: Self-pay | Admitting: Family Medicine

## 2014-02-11 ENCOUNTER — Ambulatory Visit (INDEPENDENT_AMBULATORY_CARE_PROVIDER_SITE_OTHER): Payer: Medicare Other | Admitting: Family Medicine

## 2014-02-11 ENCOUNTER — Ambulatory Visit: Payer: Medicare Other | Admitting: Diagnostic Neuroimaging

## 2014-02-11 VITALS — BP 151/83 | HR 97 | Temp 99.2°F | Ht 73.0 in | Wt 195.0 lb

## 2014-02-11 DIAGNOSIS — F209 Schizophrenia, unspecified: Secondary | ICD-10-CM

## 2014-02-11 DIAGNOSIS — R509 Fever, unspecified: Secondary | ICD-10-CM

## 2014-02-11 DIAGNOSIS — R300 Dysuria: Secondary | ICD-10-CM

## 2014-02-11 DIAGNOSIS — I10 Essential (primary) hypertension: Secondary | ICD-10-CM

## 2014-02-11 DIAGNOSIS — J329 Chronic sinusitis, unspecified: Secondary | ICD-10-CM

## 2014-02-11 LAB — POCT CBC
Granulocyte percent: 65.2 %G (ref 37–80)
HCT, POC: 43.8 % (ref 43.5–53.7)
Hemoglobin: 14.2 g/dL (ref 14.1–18.1)
Lymph, poc: 2.2 (ref 0.6–3.4)
MCH, POC: 30.2 pg (ref 27–31.2)
MCHC: 32.5 g/dL (ref 31.8–35.4)
MCV: 92.7 fL (ref 80–97)
MPV: 6.9 fL (ref 0–99.8)
POC Granulocyte: 5.7 (ref 2–6.9)
POC LYMPH PERCENT: 24.9 %L (ref 10–50)
Platelet Count, POC: 357 10*3/uL (ref 142–424)
RBC: 4.7 M/uL (ref 4.69–6.13)
RDW, POC: 12.1 %
WBC: 8.8 10*3/uL (ref 4.6–10.2)

## 2014-02-11 LAB — POCT URINALYSIS DIPSTICK
Bilirubin, UA: NEGATIVE
Glucose, UA: NEGATIVE
KETONES UA: NEGATIVE
Nitrite, UA: NEGATIVE
Spec Grav, UA: <=1.005
Urobilinogen, UA: NEGATIVE
pH, UA: 5

## 2014-02-11 LAB — POCT UA - MICROSCOPIC ONLY
Casts, Ur, LPF, POC: NEGATIVE
Crystals, Ur, HPF, POC: NEGATIVE
Yeast, UA: NEGATIVE

## 2014-02-11 MED ORDER — DOXYCYCLINE HYCLATE 100 MG PO TABS: 100.0000 mg | ORAL_TABLET | Freq: Two times a day (BID) | ORAL | Status: DC

## 2014-02-11 NOTE — Patient Instructions (Addendum)
Drink plenty of fluids--- avoid caffeine, cold drinks [facility] [place] use malleolus cough ENT. 7-Up ginger ale and Sprite are okay. Water is okay. Take Tylenol for aches and pains and fever every 4-6 hours Take antibiotic as directed with food Avoid fried greasy foods Use ayr nasal spray Take Delsym over-the-counter as needed for cough

## 2014-02-11 NOTE — Progress Notes (Signed)
Subjective:    Patient ID: Mark Benjamin, male    DOB: 02-11-1961, 53 y.o.   MRN: [id number]  HPI Patient here today for sinus trouble. He went to the ER at [facility] last Monday. The patient has had mostly head congestion and drainage and also notes that since this started he is having trouble with a slower stream and being uncomfortable passing his water. The patient is somewhat anxious about his condition and he admits this in the discussion.        Patient Active Problem List   Diagnosis Date Noted  . Metabolic syndrome 93/81/0175  . Hypertension 04/02/2013  . Hyperlipidemia 04/02/2013  . BPH (benign prostatic hyperplasia) 04/02/2013  . Left arm weakness 11/21/2012  . Parkinsonism 11/21/2012  . Neck pain 11/21/2012  . Colon adenomas 06/29/2012  . Rectal mass 06/29/2012  . Schizophrenia 06/22/2012  . Dysphagia 06/22/2011  . Eosinophilic esophagitis 01/13/8526   Outpatient Encounter Prescriptions as of 02/11/2014  Medication Sig  . Cholecalciferol (VITAMIN D3) 1000 UNITS CAPS Take 3 tablets by mouth daily.   . CRESTOR 20 MG tablet TAKE 1 TABLET BY MOUTH ONCE A DAY  . fosinopril-hydrochlorothiazide (MONOPRIL-HCT) 10-12.5 MG per tablet TAKE 1 TABLET BY MOUTH DAILY.  . hydrocortisone (PROCTOSOL HC) 2.5 % rectal cream Place 1 application rectally 2 (two) times daily.  . niacin (NIASPAN) 1000 MG CR tablet TAKE 1 TABLET (1,000 MG TOTAL) BY MOUTH AT BEDTIME.  Marland Kitchen omega-3 acid ethyl esters (LOVAZA) 1 G capsule TAKE 2 CAPSULES (2 G TOTAL) BY MOUTH 2 (TWO) TIMES DAILY.  Marland Kitchen pioglitazone (ACTOS) 15 MG tablet TAKE 1 TABLET BY MOUTH ONCE A DAY  . thioridazine (MELLARIL) 100 MG tablet Take 50 mg by mouth at bedtime.     Review of Systems  HENT: Positive for congestion and sinus pressure.   Respiratory: Positive for cough.   Genitourinary: Positive for dysuria, decreased urine volume and difficulty urinating.  Neurological: Positive for dizziness.       Objective:   Physical Exam    Constitutional: He is oriented to person, place, and time. He appears well-developed and well-nourished. He appears distressed.  HENT:  Head: Normocephalic and atraumatic.  Right Ear: External ear normal.  Left Ear: External ear normal.  Mouth/Throat: No oropharyngeal exudate.  The throat is red posteriorly and there is nasal congestion bilaterally  Eyes: Conjunctivae and EOM are normal. Pupils are equal, round, and reactive to light. Right eye exhibits no discharge. Left eye exhibits no discharge. No scleral icterus.  Neck: Normal range of motion. Neck supple. No thyromegaly present.  No anterior cervical nodes  Cardiovascular: Normal rate, regular rhythm and normal heart sounds.   No murmur heard. Pulmonary/Chest: Effort normal and breath sounds normal. No respiratory distress. He has no wheezes. He has no rales. He exhibits no tenderness.  Minimal congestion with coughing  Abdominal: Soft. Bowel sounds are normal. He exhibits no mass. There is no tenderness. There is no rebound and no guarding.  No suprapubic tenderness  Musculoskeletal: Normal range of motion. He exhibits no edema.  Lymphadenopathy:    He has no cervical adenopathy.  Neurological: He is alert and oriented to person, place, and time.  Skin: Skin is warm and dry. No rash noted.  Skin is slightly warm to touch  Psychiatric: He has a normal mood and affect. His behavior is normal. Judgment and thought content normal.  Increased anxiety  Nursing note and vitals reviewed.  BP 151/83 mmHg  Pulse 97  Temp(Src)  99.2 F (37.3 C) (Oral)  Ht 6' 1" (1.854 m)  Wt 195 lb (88.451 kg)  BMI 25.73 kg/m2  Results for orders placed or performed in visit on 02/11/14  POCT CBC  Result Value Ref Range   WBC 8.8 4.6 - 10.2 K/uL   Lymph, poc 2.2 0.6 - 3.4   POC LYMPH PERCENT 24.9 10 - 50 %L   POC Granulocyte 5.7 2 - 6.9   Granulocyte percent 65.2 37 - 80 %G   RBC 4.7 4.69 - 6.13 M/uL   Hemoglobin 14.2 14.1 - 18.1 g/dL   HCT,  POC 43.8 43.5 - 53.7 %   MCV 92.7 80 - 97 fL   MCH, POC 30.2 27 - 31.2 pg   MCHC 32.5 31.8 - 35.4 g/dL   RDW, POC 12.1 %   Platelet Count, POC 357.0 142 - 424 K/uL   MPV 6.9 0 - 99.8 fL  POCT UA - Microscopic Only  Result Value Ref Range   WBC, Ur, HPF, POC 1-3    RBC, urine, microscopic 5-6    Bacteria, U Microscopic occ    Mucus, UA occ    Epithelial cells, urine per micros occ    Crystals, Ur, HPF, POC negative    Casts, Ur, LPF, POC negative    Yeast, UA negative   POCT urinalysis dipstick  Result Value Ref Range   Color, UA gold    Clarity, UA clear    Glucose, UA negative    Bilirubin, UA negative    Ketones, UA negative    Spec Grav, UA <=1.005    Blood, UA moderate    pH, UA 5.0    Protein, UA trace    Urobilinogen, UA negative    Nitrite, UA negative    Leukocytes, UA Trace          Assessment & Plan:  1. Fever, unspecified fever cause - POCT CBC - POCT UA - Microscopic Only - POCT urinalysis dipstick - Urine culture - BMP8+EGFR  2. Rhinosinusitis - doxycycline (VIBRA-TABS) 100 MG tablet; Take 1 tablet (100 mg total) by mouth 2 (two) times daily.  Dispense: 20 tablet; Refill: 0  3. Schizophrenia, unspecified type  4. Dysuria  5. Essential hypertension - BMP8+EGFR  Patient Instructions  Drink plenty of fluids--- avoid caffeine, cold drinks [facility] use malleolus cough ENT. 7-Up ginger ale and Sprite are okay. Water is okay. Take Tylenol for aches and pains and fever every 4-6 hours Take antibiotic as directed with food Avoid fried greasy foods Use ayr nasal spray Take Delsym over-the-counter as needed for cough   Arrie Senate MD

## 2014-02-12 ENCOUNTER — Ambulatory Visit: Payer: Medicare Other | Admitting: Family Medicine

## 2014-02-12 ENCOUNTER — Telehealth: Payer: Self-pay

## 2014-02-12 LAB — URINE CULTURE: Organism ID, Bacteria: NO GROWTH

## 2014-02-12 LAB — BMP8+EGFR
BUN / CREAT RATIO: 5 — AB (ref 9–20)
BUN: 4 mg/dL — AB (ref 6–24)
CHLORIDE: 89 mmol/L — AB (ref 97–108)
CO2: 25 mmol/L (ref 18–29)
Calcium: 9.4 mg/dL (ref 8.7–10.2)
Creatinine, Ser: 0.8 mg/dL (ref 0.76–1.27)
GFR calc Af Amer: 118 mL/min/{1.73_m2} (ref >59)
GFR calc non Af Amer: 102 mL/min/{1.73_m2} (ref >59)
Glucose: 92 mg/dL (ref 65–99)
Potassium: 3.4 mmol/L — ABNORMAL LOW (ref 3.5–5.2)
Sodium: 134 mmol/L (ref 134–144)

## 2014-02-12 NOTE — Telephone Encounter (Signed)
Pt aware of results and Dr. Laurance Flatten had already reviewed them with him

## 2014-02-12 NOTE — Telephone Encounter (Signed)
-----   Message from Chipper Herb, MD sent at 02/12/2014  7:25 AM EST ----- The blood sugar is good at 92. The potassium is slightly decreased at 3.4 and we will recheck this before starting any potassium. The most important kidney function test is within normal limits.

## 2014-02-13 ENCOUNTER — Ambulatory Visit: Payer: Medicare Other | Admitting: Family Medicine

## 2014-03-13 ENCOUNTER — Telehealth: Payer: Self-pay | Admitting: Family Medicine

## 2014-03-13 MED ORDER — PIOGLITAZONE HCL 15 MG PO TABS: 15.0000 mg | ORAL_TABLET | Freq: Every day | ORAL | Status: DC

## 2014-03-13 MED ORDER — FOSINOPRIL SODIUM-HCTZ 10-12.5 MG PO TABS: 1.0000 | ORAL_TABLET | Freq: Every day | ORAL | Status: DC

## 2014-03-13 MED ORDER — ROSUVASTATIN CALCIUM 20 MG PO TABS: 20.0000 mg | ORAL_TABLET | Freq: Every day | ORAL | Status: DC

## 2014-03-13 NOTE — Telephone Encounter (Signed)
meds refilled at [facility] per Dr Laurance Flatten Pt notified

## 2014-03-18 ENCOUNTER — Telehealth: Payer: Self-pay | Admitting: Family Medicine

## 2014-03-19 ENCOUNTER — Telehealth: Payer: Self-pay | Admitting: Family Medicine

## 2014-03-20 NOTE — Telephone Encounter (Signed)
Will have to have face to face. This can only be for a skilled need, not housework

## 2014-03-20 NOTE — Telephone Encounter (Signed)
LMTCB on [phone] number

## 2014-03-20 NOTE — Telephone Encounter (Signed)
Na - no VM on edwards phone for Mark Benjamin.-jhb

## 2014-03-21 NOTE — Telephone Encounter (Signed)
Ok, I can do that but with the holidays, it will be first of next week.

## 2014-03-21 NOTE — Telephone Encounter (Signed)
Been in hosp for psych condition  They hosp --- wanted someone to check on him weekly for check - in  Just to make sure he is doing all meds as he should

## 2014-03-22 NOTE — Telephone Encounter (Signed)
Per Roselyn Reef Bullind, no longer needed

## 2014-03-25 ENCOUNTER — Ambulatory Visit: Payer: Medicare Other | Admitting: Diagnostic Neuroimaging

## 2014-03-29 ENCOUNTER — Encounter: Payer: Self-pay | Admitting: *Deleted

## 2014-03-29 ENCOUNTER — Ambulatory Visit (INDEPENDENT_AMBULATORY_CARE_PROVIDER_SITE_OTHER): Payer: Medicare Other | Admitting: Family Medicine

## 2014-03-29 ENCOUNTER — Encounter: Payer: Self-pay | Admitting: Family Medicine

## 2014-03-29 VITALS — BP 126/76 | HR 101 | Temp 98.6°F | Ht 73.0 in | Wt 207.0 lb

## 2014-03-29 DIAGNOSIS — F29 Unspecified psychosis not due to a substance or known physiological condition: Secondary | ICD-10-CM

## 2014-03-29 DIAGNOSIS — E559 Vitamin D deficiency, unspecified: Secondary | ICD-10-CM

## 2014-03-29 DIAGNOSIS — E785 Hyperlipidemia, unspecified: Secondary | ICD-10-CM

## 2014-03-29 DIAGNOSIS — Z79899 Other long term (current) drug therapy: Secondary | ICD-10-CM

## 2014-03-29 DIAGNOSIS — G20 Parkinson's disease: Secondary | ICD-10-CM

## 2014-03-29 DIAGNOSIS — I10 Essential (primary) hypertension: Secondary | ICD-10-CM

## 2014-03-29 NOTE — Progress Notes (Signed)
Mark Benjamin ,   Please review this patient's NEW / updated med list  He is complaining about bloating since starting these new meds.  Please let Dr Laurance Flatten or myself know --- Mark Benjamin

## 2014-03-29 NOTE — Patient Instructions (Addendum)
We will reschedule your visit with the neurologist , Dr. Leta Baptist  continue to keep your appointments with the [facility] services and Dr. Hoyle Barr   Return to the office for fasting lab work, we will also include on that a Depakote level   Continue to eat well and stay as active as possible    Continue to take medications as doing    We will have the clinical pharmacist review your medications and see if any of them could be contributing to your bloatedness and fullness

## 2014-03-29 NOTE — Progress Notes (Signed)
Subjective:    Patient ID: Mark Benjamin, male    DOB: 1961/02/23, 54 y.o.   MRN: [id number]  HPI Patient here today for follow up from hospital stay at [place]. He was there from 03/05/14 to 03/13/14. Admission diagnosis was Psychosis. The patient complains of feeling bloated on these new medications.  The patient indicates that he was at [facility] for several days before being transferred to the [facility] in [place] and he was released from there just before Christmas. New medications including Depakote Risperdal  and Cogentin were started. The patient indicates that he is put on some weight since he got out of the hospital. He is also seeing Dr. Hal Hope. Worse with [facility] in [place]. He saw him after he got out of the [place] and he has another appointment scheduled to see him in March. The patient has not had any blood work in this office since back in the early fall. We will plan for him to come back in to get fasting blood work and check a Depakote level. He is also complaining with bloatedness and fullness especially at nighttime and we will have the clinical pharmacist review his medicines and see if they could be contributing to this. We will not change any of his medicines however. He was supposed to see the neurologist regarding his stiffness and Parkinson's disease. He canceled the appointment which was this past Monday because he thought he was better but he says he realizes he needs to see them and this visit will be rescheduled for him.       Patient Active Problem List   Diagnosis Date Noted  . Metabolic syndrome 40/98/1191  . Hypertension 04/02/2013  . Hyperlipidemia 04/02/2013  . BPH (benign prostatic hyperplasia) 04/02/2013  . Left arm weakness 11/21/2012  . Parkinsonism 11/21/2012  . Neck pain 11/21/2012  . Colon adenomas 06/29/2012  . Rectal mass 06/29/2012  . Schizophrenia 06/22/2012  . Dysphagia  06/22/2011  . Eosinophilic esophagitis 47/82/9562   Outpatient Encounter Prescriptions as of 03/29/2014  Medication Sig  . benztropine (COGENTIN) 1 MG tablet Take 1 tablet by mouth 2 (two) times daily.  . Cholecalciferol (VITAMIN D3) 1000 UNITS CAPS Take 3 tablets by mouth daily.   . divalproex (DEPAKOTE) 250 MG DR tablet Take 23m in the am and 500 mg in the pm  . fosinopril-hydrochlorothiazide (MONOPRIL-HCT) 10-12.5 MG per tablet Take 1 tablet by mouth daily.  . niacin (NIASPAN) 1000 MG CR tablet TAKE 1 TABLET (1,000 MG TOTAL) BY MOUTH AT BEDTIME.  . pioglitazone (ACTOS) 15 MG tablet Take 1 tablet (15 mg total) by mouth daily.  . risperiDONE (RISPERDAL) 2 MG tablet Take 1 tablet by mouth 2 (two) times daily.  . rosuvastatin (CRESTOR) 20 MG tablet Take 1 tablet (20 mg total) by mouth daily.  . [DISCONTINUED] hydrocortisone (PROCTOSOL HC) 2.5 % rectal cream Place 1 application rectally 2 (two) times daily.  . [DISCONTINUED] doxycycline (VIBRA-TABS) 100 MG tablet Take 1 tablet (100 mg total) by mouth 2 (two) times daily.  . [DISCONTINUED] omega-3 acid ethyl esters (LOVAZA) 1 G capsule TAKE 2 CAPSULES (2 G TOTAL) BY MOUTH 2 (TWO) TIMES DAILY.  . [DISCONTINUED] thioridazine (MELLARIL) 100 MG tablet Take 50 mg by mouth at bedtime.     Review of Systems  Constitutional: Negative.   HENT: Negative.   Eyes: Negative.   Respiratory: Negative.   Cardiovascular: Negative.   Gastrointestinal: Negative.  Feels bloated on new meds  Endocrine: Negative.   Genitourinary: Negative.   Musculoskeletal: Negative.   Skin: Negative.   Allergic/Immunologic: Negative.   Neurological: Negative.   Hematological: Negative.   Psychiatric/Behavioral: Negative.        Objective:   Physical Exam  Constitutional: He is oriented to person, place, and time. He appears well-developed and well-nourished. No distress.  HENT:  Head: Normocephalic and atraumatic.  Right Ear: External ear normal.  Left  Ear: External ear normal.  Nose: Nose normal.  Mouth/Throat: Oropharynx is clear and moist. No oropharyngeal exudate.  Eyes: Conjunctivae and EOM are normal. Pupils are equal, round, and reactive to light. Right eye exhibits no discharge. Left eye exhibits no discharge. No scleral icterus.  Neck: Normal range of motion. Neck supple. No thyromegaly present.  No carotid bruits or thyromegaly  Cardiovascular: Normal rate, regular rhythm and normal heart sounds.   No murmur heard. At 84/m  Pulmonary/Chest: Effort normal and breath sounds normal. No respiratory distress. He has no wheezes. He has no rales. He exhibits no tenderness.  Abdominal: Soft. Bowel sounds are normal. He exhibits no mass. There is no tenderness. There is no rebound and no guarding.  Musculoskeletal: Normal range of motion. He exhibits no edema.  Lymphadenopathy:    He has no cervical adenopathy.  Neurological: He is alert and oriented to person, place, and time. He has normal reflexes.  The patient's body was somewhat rigid with minimal movement and the patient did not make a lot of eye contact during the visit. He was somewhat pumped over but he was very alert and seemed to be understanding of our conversation and his need for follow-up with specialist.  Skin: Skin is warm and dry. No rash noted. No erythema. No pallor.  Psychiatric: He has a normal mood and affect. His behavior is normal. Judgment and thought content normal.  Nursing note and vitals reviewed.  BP 126/76 mmHg  Pulse 101  Temp(Src) 98.6 F (37 C) (Oral)  Ht _0  (1.854 m)  Wt 207 lb (93.895 kg)  BMI 27.32 kg/m2        Assessment & Plan:  1. Psychosis, unspecified psychosis type -Follow up with Mental health as planned in March - POCT CBC; Future - BMP8+EGFR; Future - Hepatic function panel; Future - Valproic acid level; Future  2. High risk medication use -Drug levels with lab work - POCT CBC; Future - BMP8+EGFR; Future - Hepatic  function panel; Future - Valproic acid level; Future  3. Vitamin D deficiency -Continue current treatment and we will check urine drug level of vitamin D when you get your next lab work - POCT CBC; Future - Vit D  25 hydroxy (rtn osteoporosis monitoring); Future  4. Essential hypertension -Continue current treatment and we will monitor renal function with lab work - POCT CBC; Future - BMP8+EGFR; Future - Hepatic function panel; Future - NMR, lipoprofile; Future  5. Parkinsonism -Reschedule a visit with neurologist  6. Hyperlipidemia -Continue current treatment until lab work is returned  Patient Instructions   We will reschedule your visit with the neurologist , Dr. Leta Baptist  continue to keep your appointments with the [facility] services and Dr. Hoyle Barr   Return to the office for fasting lab work, we will also include on that a Depakote level   Continue to eat well and stay as active as possible    Continue to take medications as doing    We will have the clinical pharmacist review your  medications and see if any of them could be contributing to your bloatedness and fullness   Arrie Senate MD

## 2014-04-01 ENCOUNTER — Other Ambulatory Visit (INDEPENDENT_AMBULATORY_CARE_PROVIDER_SITE_OTHER): Payer: Medicare Other

## 2014-04-01 DIAGNOSIS — E559 Vitamin D deficiency, unspecified: Secondary | ICD-10-CM

## 2014-04-01 DIAGNOSIS — Z79899 Other long term (current) drug therapy: Secondary | ICD-10-CM

## 2014-04-01 DIAGNOSIS — F29 Unspecified psychosis not due to a substance or known physiological condition: Secondary | ICD-10-CM

## 2014-04-01 DIAGNOSIS — I10 Essential (primary) hypertension: Secondary | ICD-10-CM

## 2014-04-01 LAB — POCT CBC
GRANULOCYTE PERCENT: 69.2 % (ref 37–80)
HEMATOCRIT: 46.8 % (ref 43.5–53.7)
HEMOGLOBIN: 15.2 g/dL (ref 14.1–18.1)
Lymph, poc: 1.6 (ref 0.6–3.4)
MCH: 29.9 pg (ref 27–31.2)
MCHC: 32.4 g/dL (ref 31.8–35.4)
MCV: 92.1 fL (ref 80–97)
MPV: 8 fL (ref 0–99.8)
POC GRANULOCYTE: 4.7 (ref 2–6.9)
POC LYMPH %: 23.6 % (ref 10–50)
Platelet Count, POC: 235 10*3/uL (ref 142–424)
RBC: 5.1 M/uL (ref 4.69–6.13)
RDW, POC: 12.9 %
WBC: 6.8 10*3/uL (ref 4.6–10.2)

## 2014-04-01 NOTE — Progress Notes (Signed)
Lab only 

## 2014-04-02 ENCOUNTER — Telehealth: Payer: Self-pay | Admitting: *Deleted

## 2014-04-02 LAB — HEPATIC FUNCTION PANEL
ALT: 24 IU/L (ref 0–44)
AST: 24 IU/L (ref 0–40)
Albumin: 4.4 g/dL (ref 3.5–5.5)
Alkaline Phosphatase: 74 IU/L (ref 39–117)
Bilirubin, Direct: 0.17 mg/dL (ref 0.00–0.40)
TOTAL PROTEIN: 6.6 g/dL (ref 6.0–8.5)
Total Bilirubin: 0.9 mg/dL (ref 0.0–1.2)

## 2014-04-02 LAB — NMR, LIPOPROFILE
CHOLESTEROL: 165 mg/dL (ref 100–199)
HDL Cholesterol by NMR: 49 mg/dL (ref >39)
HDL Particle Number: 38.4 umol/L (ref >=30.5)
LDL Particle Number: 928 nmol/L (ref <1000)
LDL SIZE: 20.8 nm (ref >20.5)
LDL-C: 99 mg/dL (ref 0–99)
LP-IR Score: 42 (ref <=45)
SMALL LDL PARTICLE NUMBER: 158 nmol/L (ref <=527)
TRIGLYCERIDES BY NMR: 86 mg/dL (ref 0–149)

## 2014-04-02 LAB — BMP8+EGFR
BUN / CREAT RATIO: 16 (ref 9–20)
BUN: 14 mg/dL (ref 6–24)
CHLORIDE: 99 mmol/L (ref 97–108)
CO2: 26 mmol/L (ref 18–29)
CREATININE: 0.89 mg/dL (ref 0.76–1.27)
Calcium: 9.6 mg/dL (ref 8.7–10.2)
GFR calc non Af Amer: 98 mL/min/{1.73_m2} (ref >59)
GFR, EST AFRICAN AMERICAN: 113 mL/min/{1.73_m2} (ref >59)
Glucose: 87 mg/dL (ref 65–99)
Potassium: 4.2 mmol/L (ref 3.5–5.2)
Sodium: 139 mmol/L (ref 134–144)

## 2014-04-02 LAB — VALPROIC ACID LEVEL: VALPROIC ACID LVL: 44 ug/mL — AB (ref 50–100)

## 2014-04-02 LAB — VITAMIN D 25 HYDROXY (VIT D DEFICIENCY, FRACTURES): VIT D 25 HYDROXY: 30.2 ng/mL (ref 30.0–100.0)

## 2014-04-02 NOTE — Progress Notes (Signed)
Please call the patient and let him know the pharmacists analysis of his current medication. He should not change anything until he discusses this with his [facility].

## 2014-04-02 NOTE — Telephone Encounter (Signed)
-----   Message from Chipper Herb, MD sent at 04/02/2014  7:41 AM EST ----- The blood sugar is good at 87. The creatinine, the most important kidney function test is within normal limits. The electrolytes including potassium are within normal limits. All liver function tests are within normal limits All cholesterol numbers with advanced lipid testing are excellent and at goal. The patient should continue with his Crestor, Niaspan, and as aggressive therapeutic lifestyle changes as possible which include diet and exercise. The vitamin D level is at the low end of the normal range.------ please confirm that he is been taking vitamin D3 1000, 1 daily. If he has he should purchase the 2000 size and continue to take 2001 daily. He can finish up the one thousands by taking 2 of these daily. The valproate acid level is low. It is 44. The normal range is 50-100. Please make sure that he is taking this medication regularly. Also send a copy of this report to his therapist at [facility] is very important.. Please send a copy of this report by way of the patient to his mental health therapist.

## 2014-04-02 NOTE — Telephone Encounter (Signed)
Pt notified of results Verbalizes understanding Copy sent to [facility]

## 2014-04-08 NOTE — Progress Notes (Signed)
Pt aware of recommendations

## 2014-05-02 ENCOUNTER — Ambulatory Visit (INDEPENDENT_AMBULATORY_CARE_PROVIDER_SITE_OTHER): Payer: Medicare Other | Admitting: Family Medicine

## 2014-05-02 ENCOUNTER — Encounter: Payer: Self-pay | Admitting: Family Medicine

## 2014-05-02 VITALS — BP 134/81 | HR 80 | Temp 97.9°F | Ht 73.0 in | Wt 220.0 lb

## 2014-05-02 DIAGNOSIS — G20 Parkinson's disease: Secondary | ICD-10-CM

## 2014-05-02 DIAGNOSIS — M47812 Spondylosis without myelopathy or radiculopathy, cervical region: Secondary | ICD-10-CM

## 2014-05-02 DIAGNOSIS — F209 Schizophrenia, unspecified: Secondary | ICD-10-CM

## 2014-05-02 DIAGNOSIS — G629 Polyneuropathy, unspecified: Secondary | ICD-10-CM

## 2014-05-02 DIAGNOSIS — M542 Cervicalgia: Secondary | ICD-10-CM

## 2014-05-02 DIAGNOSIS — R8290 Unspecified abnormal findings in urine: Secondary | ICD-10-CM

## 2014-05-02 DIAGNOSIS — G20C Parkinsonism, unspecified: Secondary | ICD-10-CM

## 2014-05-02 DIAGNOSIS — N3943 Post-void dribbling: Secondary | ICD-10-CM

## 2014-05-02 DIAGNOSIS — M79602 Pain in left arm: Secondary | ICD-10-CM

## 2014-05-02 LAB — POCT UA - MICROSCOPIC ONLY
Bacteria, U Microscopic: NEGATIVE
Casts, Ur, LPF, POC: NEGATIVE
Crystals, Ur, HPF, POC: NEGATIVE
Mucus, UA: NEGATIVE
RBC, URINE, MICROSCOPIC: NEGATIVE
Yeast, UA: NEGATIVE

## 2014-05-02 LAB — POCT URINALYSIS DIPSTICK
Bilirubin, UA: NEGATIVE
Blood, UA: NEGATIVE
GLUCOSE UA: NEGATIVE
Ketones, UA: NEGATIVE
NITRITE UA: NEGATIVE
Protein, UA: NEGATIVE
Spec Grav, UA: <=1.005
Urobilinogen, UA: NEGATIVE
pH, UA: 7

## 2014-05-02 MED ORDER — DOXYCYCLINE HYCLATE 100 MG PO TABS: 100.0000 mg | ORAL_TABLET | Freq: Two times a day (BID) | ORAL | Status: DC

## 2014-05-02 NOTE — Progress Notes (Signed)
Subjective:    Patient ID: Mark Benjamin, male    DOB: [birth year], 54 y.o.   MRN: [id number]  HPI Patient here today for 6 week follow up from hospital visit for psychosis. He states he is feeling ok, but he does complain of left arm pain. The patient is doing better with his psychosis. He has gained some weight. He is still followed by day [facility]. He is complaining of pain in the left arm at times and indicates he still has some tremor. The left arm pain is somewhat relieved when he lays down. It is a dull aching pain in the left arm. A cat scan of his cervical spine indicated multiple levels with spinal stenosis and foraminal narrowing more prominent on the left. This was reviewed with the patient today and once again this is from January 2015. The patient saw Dr. Hal Neer at about that time and because he is having increased pain he is willing to go back and see him again.        Patient Active Problem List   Diagnosis Date Noted  . Metabolic syndrome 03/30/3233  . Hypertension 04/02/2013  . Hyperlipidemia 04/02/2013  . BPH (benign prostatic hyperplasia) 04/02/2013  . Left arm weakness 11/21/2012  . Parkinsonism 11/21/2012  . Neck pain 11/21/2012  . Colon adenomas 06/29/2012  . Rectal mass 06/29/2012  . Schizophrenia 06/22/2012  . Dysphagia 06/22/2011  . Eosinophilic esophagitis 57/32/2025   Outpatient Encounter Prescriptions as of 05/02/2014  Medication Sig  . benztropine (COGENTIN) 1 MG tablet Take 1 tablet by mouth 2 (two) times daily.  . Cholecalciferol (VITAMIN D3) 1000 UNITS CAPS Take 3 tablets by mouth daily.   . divalproex (DEPAKOTE) 250 MG DR tablet Take 250mg  in the am and 500 mg in the pm  . fosinopril-hydrochlorothiazide (MONOPRIL-HCT) 10-12.5 MG per tablet Take 1 tablet by mouth daily.  . niacin (NIASPAN) 1000 MG CR tablet TAKE 1 TABLET (1,000 MG TOTAL) BY MOUTH AT BEDTIME.  Marland Kitchen Omega-3 Fatty Acids (FISH OIL) 1000 MG CPDR Take 2 capsules by mouth daily.  . pioglitazone  (ACTOS) 15 MG tablet Take 1 tablet (15 mg total) by mouth daily.  . risperiDONE (RISPERDAL) 2 MG tablet Take 1 tablet by mouth 2 (two) times daily.  . rosuvastatin (CRESTOR) 20 MG tablet Take 1 tablet (20 mg total) by mouth daily.    Review of Systems  Constitutional: Negative.   HENT: Negative.   Eyes: Negative.   Respiratory: Negative.   Cardiovascular: Negative.   Gastrointestinal: Negative.   Endocrine: Negative.   Genitourinary: Negative.   Musculoskeletal: Positive for arthralgias (left arm pain - off/ on).  Skin: Negative.   Allergic/Immunologic: Negative.   Neurological: Negative.   Hematological: Negative.   Psychiatric/Behavioral: Negative.        Objective:   Physical Exam  Constitutional: He is oriented to person, place, and time. He appears well-developed and well-nourished. No distress.  The patient appears to be more communicative and more like himself today.  HENT:  Head: Normocephalic and atraumatic.  Mouth/Throat: Oropharynx is clear and moist. No oropharyngeal exudate.  There is nasal congestion bilaterally and ears cerumen bilaterally  Eyes: Conjunctivae and EOM are normal. Pupils are equal, round, and reactive to light. Right eye exhibits no discharge. Left eye exhibits no discharge. No scleral icterus.  Neck: Normal range of motion. Neck supple. No thyromegaly present.  No anterior cervical adenopathy or thyromegaly or carotid bruits  Cardiovascular: Normal rate, regular rhythm and normal heart sounds.  No murmur heard. Pulmonary/Chest: Effort normal and breath sounds normal. No respiratory distress. He has no wheezes. He has no rales. He exhibits no tenderness.  Abdominal: Soft. Bowel sounds are normal. He exhibits no mass. There is no tenderness. There is no rebound and no guarding.  Musculoskeletal: Normal range of motion. He exhibits no edema or tenderness.  The patient still has some general body rigidity specimen the upper extremities.    Lymphadenopathy:    He has no cervical adenopathy.  Neurological: He is alert and oriented to person, place, and time. No cranial nerve deficit.  The patient still has rigidity in both upper and lower extremities.  Skin: Skin is warm and dry. Rash noted. No erythema. No pallor.  He has dry skin especially around the eyes.  Psychiatric: He has a normal mood and affect. His behavior is normal. Judgment and thought content normal.  The mood and affect are flat but he does appear alert and responsive to questions appropriately.  Nursing note and vitals reviewed.  BP 134/81 mmHg  Pulse 80  Temp(Src) 97.9 F (36.6 C) (Oral)  Ht 6\' 1"  (1.854 m)  Wt 220 lb (99.791 kg)  BMI 29.03 kg/m2        Assessment & Plan:  1. 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 This is from review of his CT over 1 year ago. -We will refer him back to the neurosurgeon because of pain in the left arm and aching. - Ambulatory referral to Neurosurgery  2. Neck pain -Refer back to neurosurgery - Ambulatory referral to Neurosurgery  3. Urine abnormality--- he is having post voiding dribbling -We will check a urinalysis for infection - POCT UA - Microscopic Only - POCT urinalysis dipstick  4. Dribbling following urination -Check urinalysis for infection  5. Degenerative arthritis of cervical spine -Follow up with neurosurgery  6. Neuropathy -Follow up with neurosurgery  7. Psychosis -Continue to follow-up with [facility]  8. Parkinsonism -Continue follow-up with [facility], patient will make this appointment  Patient Instructions  We will make arrangements for patient to see the neurosurgeon again because of the left arm pain The patient will be given the number for [facility] and he will follow up with them because of his tremor. We will call you with the results of the urinalysis because of the dribbling  after voiding You should continue to drink plenty of fluids   Arrie Senate MD

## 2014-05-02 NOTE — Addendum Note (Signed)
Addended by: Zannie Cove on: 05/02/2014 05:28 PM   Modules accepted: Orders

## 2014-05-02 NOTE — Patient Instructions (Signed)
We will make arrangements for patient to see the neurosurgeon again because of the left arm pain The patient will be given the number for [facility] and he will follow up with them because of his tremor. We will call you with the results of the urinalysis because of the dribbling after voiding You should continue to drink plenty of fluids

## 2014-05-29 LAB — HM DIABETES EYE EXAM

## 2014-06-04 ENCOUNTER — Ambulatory Visit (INDEPENDENT_AMBULATORY_CARE_PROVIDER_SITE_OTHER): Payer: Medicare Other | Admitting: Diagnostic Neuroimaging

## 2014-06-04 ENCOUNTER — Encounter: Payer: Self-pay | Admitting: Diagnostic Neuroimaging

## 2014-06-04 ENCOUNTER — Encounter: Payer: Self-pay | Admitting: *Deleted

## 2014-06-04 VITALS — BP 137/86 | HR 84 | Ht 73.0 in | Wt 214.2 lb

## 2014-06-04 DIAGNOSIS — M4802 Spinal stenosis, cervical region: Secondary | ICD-10-CM

## 2014-06-04 DIAGNOSIS — G2119 Other drug induced secondary parkinsonism: Secondary | ICD-10-CM | POA: Diagnosis not present

## 2014-06-04 NOTE — Progress Notes (Signed)
[facility]  PATIENT: Mark Benjamin DOB: [birth year]  REFERRING CLINICIAN: Alphonse Guild HISTORY FROM: patient REASON FOR VISIT: follow up   HISTORICAL  CHIEF COMPLAINT:  Chief Complaint  Patient presents with  . Tremors    Rm 6    HISTORY OF PRESENT ILLNESS:   UPDATE 06/04/14 (VRP): Since last visit, patient has transitioned from thioridazine to risperdal and cogentin and depakote. Left arm symptoms are stable / slightly improved. Patient not interested in surgical treatment of c-spine surg; also, was told by neurosurg that his neck issues do not need surg decompression.  UPDATE 08/10/13 (LL): Patient states that he was evaluated by Dr. Vertell Limber for cervical stenosis and was told that there was nothing seen on Myelogram that needed surgery. His Thioridazine dose is now 100 mg. Symptoms are stable. He was advised by PCP to cut out artificial sweeteners to see if that changed his symptoms; he did so 2 weeks ago. He complains of mild left leg weakness.  UPDATE 02/26/13 (VRP): Since last visit patient had MRI of the brain and cervical spine which shows moderate spinal stenosis at C5-6 level with severe bilateral foraminal stenosis. Also mild spinal stenosis and moderate right foraminal stenosis at C6-7. No cord signal abnormalities. Since last visit thioridazine dosing has been reduced. Overall his symptoms are stable to slightly progressed.  PRIOR HPI (11/21/12, VRP): 54 year old left-handed male with history of diabetes, hypercholesterolemia, schizophrenia, on thioridazine since 1986, here for evaluation of left arm weakness and numbness since March 2014. Patient reports gradual onset, progressive numbness and weakness and slowness of his left arm. He denies any symptoms in his right arm or legs. No facial droop or facial numbness. He has noticed some slurred speech over the past one week. Patient is left-handed and has noticed difficulty with brushing his teeth with his left  hand, handwriting and other fine movements. Patient's psychiatrist reduced thioridazine dose from 300 mg at bedtime down to 200 mg at bedtime last month, out of consideration of the patient's presenting symptoms as a potential side effect of medication. Since reducing the dose, no change in symptoms.   REVIEW OF SYSTEMS: Full 14 system review of systems performed and notable only for: rectal bleeding rectal pain constipation --> to be addressed by PCP.    ALLERGIES: Allergies  Allergen Reactions  . Amoxicillin     Dizzy and blurred vision  . Erythromycin   . Zocor [Simvastatin]     HOME MEDICATIONS: Outpatient Prescriptions Prior to Visit  Medication Sig Dispense Refill  . benztropine (COGENTIN) 1 MG tablet Take 1 tablet by mouth 2 (two) times daily.    . Cholecalciferol (VITAMIN D3) 1000 UNITS CAPS Take 3 tablets by mouth daily.     . divalproex (DEPAKOTE) 250 MG DR tablet Take 250mg  in the am and 500 mg in the pm    . fosinopril-hydrochlorothiazide (MONOPRIL-HCT) 10-12.5 MG per tablet Take 1 tablet by mouth daily. 90 tablet 1  . Omega-3 Fatty Acids (FISH OIL) 1000 MG CPDR Take 2 capsules by mouth daily.    . pioglitazone (ACTOS) 15 MG tablet Take 1 tablet (15 mg total) by mouth daily. 90 tablet 1  . risperiDONE (RISPERDAL) 2 MG tablet Take 1 tablet by mouth 2 (two) times daily.    . rosuvastatin (CRESTOR) 20 MG tablet Take 1 tablet (20 mg total) by mouth daily. 90 tablet 1  . doxycycline (VIBRA-TABS) 100 MG tablet Take 1 tablet (100 mg total) by mouth 2 (two) times  daily. (Patient not taking: Reported on 06/04/2014) 28 tablet 0  . niacin (NIASPAN) 1000 MG CR tablet TAKE 1 TABLET (1,000 MG TOTAL) BY MOUTH AT BEDTIME. (Patient not taking: Reported on 06/04/2014) 30 tablet 5   No facility-administered medications prior to visit.    PAST MEDICAL HISTORY: Past Medical History  Diagnosis Date  . Essential hypertension, benign   . Other and unspecified hyperlipidemia   . 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 Schizophrenia   . Hyperplasia of prostate   . Fatty liver   . Colon polyps   . Dysphagia     PAST SURGICAL HISTORY: Past Surgical History  Procedure Laterality Date  . Upper gastrointestinal endoscopy  EGD ED    09/09/2010  . Colonoscopy  02/13/08  . Colonoscopy N/A 07/10/2012    Procedure: COLONOSCOPY;  Surgeon: Rogene Houston, MD;  Location: [facility];  Service: Endoscopy;  Laterality: N/A;  225    FAMILY HISTORY: No family history on file.  SOCIAL HISTORY:  History   Social History  . Marital Status: Single    Spouse Name: N/A  . Number of Children: 0  . Years of Education: 12th   Occupational History  .     Social History Main Topics  . Smoking status: Never Smoker   . Smokeless tobacco: Never Used  . Alcohol Use: Yes     Comment: very rare  . Drug Use: No  . Sexual Activity: Not on file   Other Topics Concern  . Not on file   Social History Narrative   Patient lives at home with his mother.   Caffeine Use: none   Patient is both right and left handed.     PHYSICAL EXAM  Filed Vitals:   06/04/14 1530  BP: 137/86  Pulse: 84  Height: 6\' 1"  (1.854 m)  Weight: 214 lb 3.2 oz (97.16 kg)    Not recorded      Body mass index is 28.27 kg/(m^2).  GENERAL EXAM: Patient is in no distress  CARDIOVASCULAR: Regular rate and rhythm, no murmurs, no carotid bruits  NEUROLOGIC: MENTAL STATUS: awake, alert, language fluent, comprehension intact, naming intact; MASKED FACIES. HESTITANT STUTTERING SPEECH CRANIAL NERVE: no papilledema on fundoscopic exam, pupils equal and reactive to light, visual fields full to confrontation, extraocular muscles intact, no nystagmus, facial sensation and strength symmetric, uvula midline, shoulder shrug symmetric, tongue midline. MOTOR: INCREASED TONE IN LUE WITH RARE, FINE REST TREMOR. SIGNIFICANT BRADYKINESIA IN LUE. RUE AND BLE 5. LUE FINGER ABDUCTION AND EXTENSION 4.    SENSORY: normal and symmetric to light touch, temperature, vibration COORDINATION: finger-nose-finger, fine finger movements normal REFLEXES: RUE 3, LUE 3+, KNEES 3, ANKLES 2.  GAIT/STATION: narrow based gait; STOOPED POSTURE. ABSENT ARM SWING.   DIAGNOSTIC DATA (LABS, IMAGING, TESTING) - I reviewed patient records, labs, notes, testing and imaging myself where available.  Lab Results  Component Value Date   WBC 6.8 04/01/2014   HGB 15.2 04/01/2014   HCT 46.8 04/01/2014   MCV 92.1 04/01/2014   PLT 242 05/04/2010      Component Value Date/Time   NA 139 04/01/2014 0832   NA 140 04/24/2013 0928   K 4.2 04/01/2014 0832   CL 99 04/01/2014 0832   CO2 26 04/01/2014 0832   GLUCOSE 87 04/01/2014 0832   GLUCOSE 94 04/24/2013 0928   BUN 14 04/01/2014 0832   BUN 10 04/24/2013 0928   CREATININE 0.89 04/01/2014 2703  CREATININE 0.86 04/24/2013 0928   CALCIUM 9.6 04/01/2014 0832   PROT 6.6 04/01/2014 0832   PROT 6.6 04/24/2013 0928   ALBUMIN 4.6 04/24/2013 0928   AST 24 04/01/2014 0832   ALT 24 04/01/2014 0832   ALKPHOS 74 04/01/2014 0832   BILITOT 0.9 04/01/2014 0832   GFRNONAA 98 04/01/2014 0832   GFRNONAA >89 04/24/2013 0928   GFRAA 113 04/01/2014 0832   GFRAA >89 04/24/2013 0928   Lab Results  Component Value Date   CHOL 165 04/01/2014   HDL 49 04/01/2014   LDLCALC 52 11/08/2013   TRIG 86 04/01/2014   Lab Results  Component Value Date   HGBA1C 4.8 11/08/2013   No results found for: [id number] Lab Results  Component Value Date   TSH 1.980 10/30/2012    I reviewed images myself and agree with interpretation. -VRP  11/29/12 MRI cervical  1. At C5-6: disc bulging with moderate spinal stenosis and severe biforaminal stenosis  2. At C3-4, C4-5, C6-7: disc bulging with mild spinal stenosis and severe biforaminal stenosis  3. No cord signal abnormalities.  11/29/12 MRI brain (without) demonstrating:  1. Single right frontal subcortical focus (42mm) of non-specific  gliosis.  2. No acute findings.  04/12/13 CT myelogram cervical spine - Central disc herniation at C3-4 with moderate central stenosis. Severe left foraminal narrowing at this level. Moderate central stenosis at C5-6 and C6-7 secondary to posterior disc osteophytes.    ASSESSMENT AND PLAN  54 y.o. year old male here with gradual onset, progressive left arm weakness, slowness and stiffness with some numbness. On exam, patient has masked facies, rigidity in the left arm, bradykinesia in the left arm, stooped posture and poor arm swing. Also with hyperreflexia in BUE and BLE.   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 Continue to use lowest possible dose of anti-psychotic medication 2. Physical activity / exercises; offered OT eval but patient decline at this time  Return in about 6 months (around 12/05/2014).    Penni Bombard, MD 09/12/7626, 3:15 PM Certified in Neurology, Neurophysiology and Neuroimaging  [facility] [address] [place], [place] [address] [phone]

## 2014-07-18 ENCOUNTER — Encounter: Payer: Self-pay | Admitting: Family Medicine

## 2014-07-18 ENCOUNTER — Ambulatory Visit (INDEPENDENT_AMBULATORY_CARE_PROVIDER_SITE_OTHER): Payer: Medicare Other | Admitting: Family Medicine

## 2014-07-18 VITALS — BP 129/72 | HR 93 | Temp 97.9°F | Ht 73.0 in | Wt 215.0 lb

## 2014-07-18 DIAGNOSIS — R350 Frequency of micturition: Secondary | ICD-10-CM | POA: Diagnosis not present

## 2014-07-18 DIAGNOSIS — M542 Cervicalgia: Secondary | ICD-10-CM

## 2014-07-18 DIAGNOSIS — M5090 Cervical disc disorder, unspecified, unspecified cervical region: Secondary | ICD-10-CM

## 2014-07-18 LAB — POCT UA - MICROSCOPIC ONLY
CRYSTALS, UR, HPF, POC: NEGATIVE
Casts, Ur, LPF, POC: NEGATIVE
Epithelial cells, urine per micros: NEGATIVE
RBC, URINE, MICROSCOPIC: NEGATIVE
Yeast, UA: NEGATIVE

## 2014-07-18 LAB — POCT URINALYSIS DIPSTICK
Bilirubin, UA: NEGATIVE
Glucose, UA: NEGATIVE
KETONES UA: NEGATIVE
Leukocytes, UA: NEGATIVE
Nitrite, UA: NEGATIVE
PH UA: 5
Protein, UA: NEGATIVE
Spec Grav, UA: <=1.005
Urobilinogen, UA: NEGATIVE

## 2014-07-18 NOTE — Progress Notes (Signed)
Subjective:    Patient ID: Mark Benjamin, male    DOB: May 05, 1960, 54 y.o.   MRN: [id number]  HPI Patient here today for left arm tremor and pain and some on-going urine frequency. A CT scan done about a year ago showed degenerative disc changes in the cervical spine with some left foraminal encroachment and central canal stenosis. This report was reviewed with the patient. He says that the left arm pain comes and goes and it is especially difficult when he uses his left hand to do twisting motion. He has seen the neurologist and distantly he has seen the neurosurgeon. He also complains of some weakness in the left leg.       Patient Active Problem List   Diagnosis Date Noted  . Metabolic syndrome 67/02/4579  . Hypertension 04/02/2013  . Hyperlipidemia 04/02/2013  . BPH (benign prostatic hyperplasia) 04/02/2013  . Left arm weakness 11/21/2012  . Parkinsonism 11/21/2012  . Neck pain 11/21/2012  . Colon adenomas 06/29/2012  . Rectal mass 06/29/2012  . Schizophrenia 06/22/2012  . Dysphagia 06/22/2011  . Eosinophilic esophagitis 99/83/3825   Outpatient Encounter Prescriptions as of 07/18/2014  Medication Sig  . benztropine (COGENTIN) 1 MG tablet Take 1 tablet by mouth 2 (two) times daily.  . Cholecalciferol (VITAMIN D3) 1000 UNITS CAPS Take 3 tablets by mouth daily.   . divalproex (DEPAKOTE) 250 MG DR tablet Take 250mg  in the am and 500 mg in the pm  . fosinopril-hydrochlorothiazide (MONOPRIL-HCT) 10-12.5 MG per tablet Take 1 tablet by mouth daily.  . Omega-3 Fatty Acids (FISH OIL) 1000 MG CPDR Take 2 capsules by mouth daily.  . pioglitazone (ACTOS) 15 MG tablet Take 1 tablet (15 mg total) by mouth daily.  . risperiDONE (RISPERDAL) 2 MG tablet Take 1 tablet by mouth 2 (two) times daily.  . rosuvastatin (CRESTOR) 20 MG tablet Take 1 tablet (20 mg total) by mouth daily.    Review of Systems  Constitutional: Negative.   HENT: Negative.   Eyes: Negative.   Respiratory: Negative.     Cardiovascular: Negative.   Gastrointestinal: Negative.   Endocrine: Negative.   Genitourinary: Positive for frequency.  Musculoskeletal: Positive for myalgias (left arm ).  Skin: Negative.   Allergic/Immunologic: Negative.   Neurological: Positive for tremors (left arm).  Hematological: Negative.   Psychiatric/Behavioral: Negative.        Objective:   Physical Exam  Constitutional: He is oriented to person, place, and time. He appears well-developed and well-nourished. No distress.  The patient is pleasant and alert and is aware of what is going on with him.  HENT:  Head: Normocephalic and atraumatic.  Eyes: Conjunctivae and EOM are normal. Pupils are equal, round, and reactive to light. Right eye exhibits no discharge. Left eye exhibits no discharge. No scleral icterus.  Neck: Normal range of motion. Neck supple. No thyromegaly present.  Cardiovascular: Normal rate and regular rhythm.   No murmur heard. Pulmonary/Chest: Effort normal and breath sounds normal. No respiratory distress. He has no wheezes. He has no rales. He exhibits no tenderness.  Abdominal: Soft. Bowel sounds are normal. He exhibits no mass. There is no tenderness. There is no rebound and no guarding.  No suprapubic tenderness  Genitourinary: Rectum normal.  Musculoskeletal: Normal range of motion. He exhibits no edema.  Lymphadenopathy:    He has no cervical adenopathy.  Neurological: He is alert and oriented to person, place, and time. He has normal reflexes. No cranial nerve deficit.  Skin: Skin is  warm and dry. No rash noted.  Psychiatric: He has a normal mood and affect. His behavior is normal. Judgment and thought content normal.  Nursing note and vitals reviewed.  BP 129/72 mmHg  Pulse 93  Temp(Src) 97.9 F (36.6 C) (Oral)  Ht 6\' 1"  (1.854 m)  Wt 215 lb (97.523 kg)  BMI 28.37 kg/m2        Assessment & Plan:  1. Urine frequency -The urinalysis had only a rare WBC and patient was not treated  for any infection today. - POCT UA - Microscopic Only - POCT urinalysis dipstick  2. Neck pain on left side -The patient has a history of cervical disc disease per sinus CT scan done in January 2015.  3. Cervical disc disease -The areas of involvement with a cervical disc disease seem to be the same areas that he is having trouble with in his arm. -We will arrange for him to see the neurosurgeon again for further follow-up and possible injections in the neck to relieve his discomfort  Patient Instructions  We will arrange for the patient to see the neurosurgeon again for follow-up because of the symptoms and the findings on the past CT scan. He was given a copy of this report. The patient was also informed that his urinalysis was clear and that there was no need for any infection treatment.   Arrie Senate MD

## 2014-07-18 NOTE — Patient Instructions (Signed)
We will arrange for the patient to see the neurosurgeon again for follow-up because of the symptoms and the findings on the past CT scan. He was given a copy of this report. The patient was also informed that his urinalysis was clear and that there was no need for any infection treatment.

## 2014-07-24 ENCOUNTER — Telehealth: Payer: Self-pay | Admitting: Family Medicine

## 2014-07-26 ENCOUNTER — Other Ambulatory Visit: Payer: Self-pay | Admitting: Family Medicine

## 2014-07-26 MED ORDER — HYDROCORTISONE 2.5 % RE CREA: 1.0000 "application " | TOPICAL_CREAM | Freq: Two times a day (BID) | RECTAL | Status: DC

## 2014-07-26 NOTE — Telephone Encounter (Signed)
Do not see an order. [facility] pt

## 2014-07-26 NOTE — Telephone Encounter (Signed)
Hemorrhoid cream rx sent to pharmacy

## 2014-07-26 NOTE — Telephone Encounter (Signed)
Patient aware rx sent to pharmacy.  

## 2014-08-05 ENCOUNTER — Ambulatory Visit (INDEPENDENT_AMBULATORY_CARE_PROVIDER_SITE_OTHER): Payer: Medicare Other | Admitting: Family Medicine

## 2014-08-05 ENCOUNTER — Encounter: Payer: Self-pay | Admitting: Family Medicine

## 2014-08-05 VITALS — BP 130/79 | HR 68 | Temp 98.3°F | Ht 73.0 in | Wt 213.0 lb

## 2014-08-05 DIAGNOSIS — E559 Vitamin D deficiency, unspecified: Secondary | ICD-10-CM

## 2014-08-05 DIAGNOSIS — N400 Enlarged prostate without lower urinary tract symptoms: Secondary | ICD-10-CM

## 2014-08-05 DIAGNOSIS — G20 Parkinson's disease: Secondary | ICD-10-CM

## 2014-08-05 DIAGNOSIS — E785 Hyperlipidemia, unspecified: Secondary | ICD-10-CM | POA: Diagnosis not present

## 2014-08-05 DIAGNOSIS — I10 Essential (primary) hypertension: Secondary | ICD-10-CM | POA: Diagnosis not present

## 2014-08-05 DIAGNOSIS — F209 Schizophrenia, unspecified: Secondary | ICD-10-CM

## 2014-08-05 DIAGNOSIS — Z79899 Other long term (current) drug therapy: Secondary | ICD-10-CM

## 2014-08-05 DIAGNOSIS — M79602 Pain in left arm: Secondary | ICD-10-CM

## 2014-08-05 DIAGNOSIS — M5090 Cervical disc disorder, unspecified, unspecified cervical region: Secondary | ICD-10-CM | POA: Diagnosis not present

## 2014-08-05 DIAGNOSIS — G20C Parkinsonism, unspecified: Secondary | ICD-10-CM

## 2014-08-05 LAB — POCT CBC
Granulocyte percent: 66.7 % (ref 37–80)
HCT, POC: 46.6 % (ref 43.5–53.7)
Hemoglobin: 14.8 g/dL (ref 14.1–18.1)
Lymph, poc: 1.3 (ref 0.6–3.4)
MCH, POC: 29.5 pg (ref 27–31.2)
MCHC: 31.7 g/dL — AB (ref 31.8–35.4)
MCV: 93.1 fL (ref 80–97)
MPV: 8.2 fL (ref 0–99.8)
POC Granulocyte: 3.1 (ref 2–6.9)
POC LYMPH PERCENT: 28.1 % (ref 10–50)
Platelet Count, POC: 189 K/uL (ref 142–424)
RBC: 5.01 M/uL (ref 4.69–6.13)
RDW, POC: 12.6 %
WBC: 4.6 K/uL (ref 4.6–10.2)

## 2014-08-05 NOTE — Progress Notes (Signed)
Subjective:    Patient ID: Mark Benjamin, male    DOB: 1960-11-27, 54 y.o.   MRN: [id number]  HPI Pt here for follow up and management of chronic medical problems which includes hyperlipidemia, schizophrenia, and hypertension. He is taking medications regularly. Patient is due today to have a rectal exam and prostate exam but wants to defer this because of recent problems with hemorrhoids secondary to constipation. He says this problem is getting better and that initially he had some bleeding and now after using Proctosol cream he is doing better with this. He is also having less problems with his left arm. He also check with his insurance company and said that they would pay for his shingles shot which she wants to defer until the next visit. He denies chest pain shortness of breath or gastrointestinal symptoms. He is also voiding without problems.      Patient Active Problem List   Diagnosis Date Noted  . Metabolic syndrome 71/24/5809  . Hypertension 04/02/2013  . Hyperlipidemia 04/02/2013  . BPH (benign prostatic hyperplasia) 04/02/2013  . Left arm weakness 11/21/2012  . Parkinsonism 11/21/2012  . Neck pain 11/21/2012  . Colon adenomas 06/29/2012  . Rectal mass 06/29/2012  . Schizophrenia 06/22/2012  . Dysphagia 06/22/2011  . Eosinophilic esophagitis 98/33/8250   Outpatient Encounter Prescriptions as of 08/05/2014  Medication Sig  . benztropine (COGENTIN) 1 MG tablet Take 1 tablet by mouth 2 (two) times daily.  . Cholecalciferol (VITAMIN D3) 1000 UNITS CAPS Take 3 tablets by mouth daily.   . divalproex (DEPAKOTE) 250 MG DR tablet Take 250m in the am and 500 mg in the pm  . fosinopril-hydrochlorothiazide (MONOPRIL-HCT) 10-12.5 MG per tablet Take 1 tablet by mouth daily.  . hydrocortisone (ANUSOL-HC) 2.5 % rectal cream Place 1 application rectally 2 (two) times daily.  . Omega-3 Fatty Acids (FISH OIL) 1000 MG CPDR Take 2 capsules by mouth daily.  . pioglitazone (ACTOS) 15 MG  tablet Take 1 tablet (15 mg total) by mouth daily.  . risperiDONE (RISPERDAL) 2 MG tablet Take 1 tablet by mouth 2 (two) times daily.  . rosuvastatin (CRESTOR) 20 MG tablet Take 1 tablet (20 mg total) by mouth daily.   No facility-administered encounter medications on file as of 08/05/2014.      Review of Systems  Constitutional: Negative.   HENT: Negative.   Eyes: Negative.   Respiratory: Negative.   Cardiovascular: Negative.   Gastrointestinal: Positive for rectal pain.  Endocrine: Negative.   Genitourinary: Negative.   Musculoskeletal: Negative.   Skin: Negative.   Allergic/Immunologic: Negative.   Neurological: Negative.   Hematological: Negative.   Psychiatric/Behavioral: Negative.        Objective:   Physical Exam  Constitutional: He is oriented to person, place, and time. He appears well-developed and well-nourished. No distress.  The patient is alert and cooperative. He seems to be in better spirits today and more positive about himself.  HENT:  Head: Normocephalic and atraumatic.  Right Ear: External ear normal.  Left Ear: External ear normal.  Nose: Nose normal.  Mouth/Throat: Oropharynx is clear and moist. No oropharyngeal exudate.  Eyes: Conjunctivae and EOM are normal. Pupils are equal, round, and reactive to light. Right eye exhibits no discharge. Left eye exhibits no discharge. No scleral icterus.  Neck: Normal range of motion. Neck supple. No thyromegaly present.  Without bruits or thyromegaly  Cardiovascular: Normal rate, regular rhythm, normal heart sounds and intact distal pulses.   No murmur heard. Pulmonary/Chest: Effort  normal and breath sounds normal. No respiratory distress. He has no wheezes. He has no rales. He exhibits no tenderness.  Abdominal: Soft. Bowel sounds are normal. He exhibits no mass. There is no tenderness. There is no rebound and no guarding.  Genitourinary: Rectum normal.  On examination of the rectum without digital exam everything  appeared to be pretty normal and apparently the hemorrhoid problem he was having is resolving  Musculoskeletal: Normal range of motion. He exhibits no edema or tenderness.  Lymphadenopathy:    He has no cervical adenopathy.  Neurological: He is alert and oriented to person, place, and time. He has normal reflexes. No cranial nerve deficit.  Skin: Skin is warm and dry. No rash noted. No erythema. No pallor.  Nell fungus left great toe  Psychiatric: He has a normal mood and affect. His behavior is normal. Judgment and thought content normal.  Nursing note and vitals reviewed.  BP 130/79 mmHg  Pulse 68  Temp(Src) 98.3 F (36.8 C) (Oral)  Ht _0  (1.854 m)  Wt 213 lb (96.616 kg)  BMI 28.11 kg/m2        Assessment & Plan:  1. Parkinsonism -This may be medication related since he appears to be less stiff and less tremor since medicines have been reduced - POCT CBC  2. Schizophrenia, unspecified type -He will continue with follow-up by the county psychologist/psychiatrist - POCT CBC  3. Vitamin D deficiency -Continue current treatment pending results of lab work - POCT CBC - Vit D  25 hydroxy (rtn osteoporosis monitoring)  4. High risk medication use - POCT CBC - BMP8+EGFR - Hepatic function panel  5. Hyperlipidemia -Continue current treatment pending results of lab work, the patient needs to exercise more regularly - POCT CBC - NMR, lipoprofile  6. Essential hypertension -The blood pressure is good today despite not taking his medication and he will in the future take his medication even when he has lab work to be done. - POCT CBC - BMP8+EGFR - Hepatic function panel  7. BPH (benign prostatic hyperplasia) -The patient is not having any trouble with voiding and he requested that we refrain from doing a rectal exam today because of recent problems with his hemorrhoids. - POCT CBC - POCT urinalysis dipstick - POCT UA - Microscopic Only  8. Left arm pain -He says the  left arm pain is improving.  9. Cervical disc disease -This is from his MRI and he plans to see the neurosurgeon again soon.  No orders of the defined types were placed in this encounter.   Patient Instructions  Continue current medications. Continue good therapeutic lifestyle changes which include good diet and exercise. Fall precautions discussed with patient. If an FOBT was given today- please return it to our front desk. If you are over 27 years old - you may need Prevnar 77 or the adult Pneumonia vaccine.  Flu Shots are still available at our office. If you still haven't had one please call to set up a nurse visit to get one.   After your visit with [place] today you will receive a survey in the mail or online from [employer] regarding your care with [place]. Please take a moment to fill this out. Your feedback is very important to [place] as you can help [place] better understand your patient needs as well as improve your experience and satisfaction. WE CARE ABOUT YOU!!!   Continue to drink plenty of water and fluids Drink prune juice if you get more  constipated Tub soaks can also be helpful Walk regularly stay active physically Keep appointment with neurosurgeon Keep appointment with psychologist   Arrie Senate MD

## 2014-08-05 NOTE — Patient Instructions (Addendum)
Continue current medications. Continue good therapeutic lifestyle changes which include good diet and exercise. Fall precautions discussed with patient. If an FOBT was given today- please return it to our front desk. If you are over 54 years old - you may need Prevnar 36 or the adult Pneumonia vaccine.  Flu Shots are still available at our office. If you still haven't had one please call to set up a nurse visit to get one.   After your visit with [place] today you will receive a survey in the mail or online from [employer] regarding your care with [place]. Please take a moment to fill this out. Your feedback is very important to [place] as you can help [place] better understand your patient needs as well as improve your experience and satisfaction. WE CARE ABOUT YOU!!!   Continue to drink plenty of water and fluids Drink prune juice if you get more constipated Tub soaks can also be helpful Walk regularly stay active physically Keep appointment with neurosurgeon Keep appointment with psychologist

## 2014-08-06 LAB — HEPATIC FUNCTION PANEL
ALT: 35 IU/L (ref 0–44)
AST: 35 IU/L (ref 0–40)
Albumin: 4.5 g/dL (ref 3.5–5.5)
Alkaline Phosphatase: 62 IU/L (ref 39–117)
BILIRUBIN TOTAL: 1.5 mg/dL — AB (ref 0.0–1.2)
BILIRUBIN, DIRECT: 0.31 mg/dL (ref 0.00–0.40)
TOTAL PROTEIN: 6.9 g/dL (ref 6.0–8.5)

## 2014-08-06 LAB — NMR, LIPOPROFILE
CHOLESTEROL: 120 mg/dL (ref 100–199)
HDL Cholesterol by NMR: 43 mg/dL (ref >39)
HDL Particle Number: 32.9 umol/L (ref >=30.5)
LDL PARTICLE NUMBER: 758 nmol/L (ref <1000)
LDL SIZE: 20.4 nm (ref >20.5)
LDL-C: 62 mg/dL (ref 0–99)
LP-IR SCORE: 39 (ref <=45)
Small LDL Particle Number: 405 nmol/L (ref <=527)
Triglycerides by NMR: 73 mg/dL (ref 0–149)

## 2014-08-06 LAB — BMP8+EGFR
BUN/Creatinine Ratio: 7 — ABNORMAL LOW (ref 9–20)
BUN: 6 mg/dL (ref 6–24)
CO2: 27 mmol/L (ref 18–29)
Calcium: 9.7 mg/dL (ref 8.7–10.2)
Chloride: 99 mmol/L (ref 97–108)
Creatinine, Ser: 0.9 mg/dL (ref 0.76–1.27)
GFR, EST AFRICAN AMERICAN: 112 mL/min/{1.73_m2} (ref >59)
GFR, EST NON AFRICAN AMERICAN: 96 mL/min/{1.73_m2} (ref >59)
GLUCOSE: 92 mg/dL (ref 65–99)
Potassium: 4.5 mmol/L (ref 3.5–5.2)
Sodium: 139 mmol/L (ref 134–144)

## 2014-08-06 LAB — VITAMIN D 25 HYDROXY (VIT D DEFICIENCY, FRACTURES): Vit D, 25-Hydroxy: 42.5 ng/mL (ref 30.0–100.0)

## 2014-08-06 LAB — PSA, TOTAL AND FREE
PROSTATE SPECIFIC AG, SERUM: 1 ng/mL (ref 0.0–4.0)
PSA, Free Pct: 40 %
PSA, Free: 0.4 ng/mL

## 2014-09-02 ENCOUNTER — Telehealth: Payer: Self-pay | Admitting: Family Medicine

## 2014-09-02 NOTE — Telephone Encounter (Signed)
Appointment given for tomorrow 6/14 @ 10:00am with Laurance Flatten.

## 2014-09-03 ENCOUNTER — Ambulatory Visit (INDEPENDENT_AMBULATORY_CARE_PROVIDER_SITE_OTHER): Payer: Medicare Other | Admitting: Family Medicine

## 2014-09-03 ENCOUNTER — Encounter: Payer: Self-pay | Admitting: Family Medicine

## 2014-09-03 VITALS — BP 143/87 | HR 84 | Temp 97.7°F | Ht 73.0 in | Wt 213.0 lb

## 2014-09-03 DIAGNOSIS — K6289 Other specified diseases of anus and rectum: Secondary | ICD-10-CM | POA: Diagnosis not present

## 2014-09-03 DIAGNOSIS — K625 Hemorrhage of anus and rectum: Secondary | ICD-10-CM | POA: Diagnosis not present

## 2014-09-03 MED ORDER — HYDROCORTISONE ACE-PRAMOXINE 1-1 % RE FOAM: 1.0000 | Freq: Two times a day (BID) | RECTAL | Status: DC

## 2014-09-03 NOTE — Progress Notes (Signed)
Subjective:    Patient ID: Mark Benjamin, male    DOB: Jun 21, 1960, 54 y.o.   MRN: [id number]  HPI Pt here today for rectal pain that started yesterday after a bowel movement. The patient has had some bleeding and rectal pain. The patient has been doing a high fiber diet. He says the rectal pain comes and goes and sometimes may last all day after a bowel movement in the morning with increased burning sensation in the rectum. He had a colonoscopy in June 2012 and was told this was normal. The discomfort is especially bad after bowel movement. He occasionally does have some blood especially with increased constipation but no abdominal pain.      Patient Active Problem List   Diagnosis Date Noted  . Metabolic syndrome 38/32/9191  . Hypertension 04/02/2013  . Hyperlipidemia 04/02/2013  . BPH (benign prostatic hyperplasia) 04/02/2013  . Left arm weakness 11/21/2012  . Parkinsonism 11/21/2012  . Neck pain 11/21/2012  . Colon adenomas 06/29/2012  . Rectal mass 06/29/2012  . Schizophrenia 06/22/2012  . Dysphagia 06/22/2011  . Eosinophilic esophagitis 66/08/43   Outpatient Encounter Prescriptions as of 09/03/2014  Medication Sig  . benztropine (COGENTIN) 1 MG tablet Take 1 tablet by mouth 2 (two) times daily.  . Cholecalciferol (VITAMIN D3) 1000 UNITS CAPS Take 3 tablets by mouth daily.   . divalproex (DEPAKOTE) 250 MG DR tablet Take 250mg  in the am and 500 mg in the pm  . fosinopril-hydrochlorothiazide (MONOPRIL-HCT) 10-12.5 MG per tablet Take 1 tablet by mouth daily.  . hydrocortisone (ANUSOL-HC) 2.5 % rectal cream Place 1 application rectally 2 (two) times daily.  . Omega-3 Fatty Acids (FISH OIL) 1000 MG CPDR Take 2 capsules by mouth daily.  . pioglitazone (ACTOS) 15 MG tablet Take 1 tablet (15 mg total) by mouth daily.  . risperiDONE (RISPERDAL) 2 MG tablet Take 1 tablet by mouth 2 (two) times daily.  . rosuvastatin (CRESTOR) 20 MG tablet Take 1 tablet (20 mg total) by mouth daily.     No facility-administered encounter medications on file as of 09/03/2014.      Review of Systems  Constitutional: Negative.   HENT: Negative.   Eyes: Negative.   Respiratory: Negative.   Cardiovascular: Negative.   Gastrointestinal: Positive for anal bleeding and rectal pain.  Endocrine: Negative.   Genitourinary: Negative.   Musculoskeletal: Negative.   Skin: Negative.   Allergic/Immunologic: Negative.   Neurological: Negative.   Hematological: Negative.   Psychiatric/Behavioral: Negative.        Objective:   Physical Exam  Constitutional: He is oriented to person, place, and time. He appears well-developed and well-nourished. No distress.  HENT:  Head: Normocephalic and atraumatic.  Eyes: Conjunctivae and EOM are normal. Pupils are equal, round, and reactive to light. Right eye exhibits no discharge. Left eye exhibits no discharge.  Neck: Normal range of motion.  Abdominal: Soft. Bowel sounds are normal. He exhibits no distension and no mass. There is no tenderness. There is no rebound and no guarding.  Genitourinary: Penis normal. Guaiac positive stool (there was blood on the fingertip after the rectal exam.). No penile tenderness.  The prostate is enlarged and soft. The rectal exam was extremely sensitive and uncomfortable to the patient. No masses were palpated. The patient said it was uncomfortable especially in the rectal area. There was a lot of stool in the rectum. The external genitalia were normal.  Musculoskeletal: Normal range of motion. He exhibits no edema.  Neurological: He is alert  and oriented to person, place, and time.  Skin: Skin is warm and dry. No rash noted.  Psychiatric: He has a normal mood and affect. His behavior is normal. Judgment and thought content normal.  Vitals reviewed.  BP 143/87 mmHg  Pulse 84  Temp(Src) 97.7 F (36.5 C) (Oral)  Ht 6\' 1"  (1.854 m)  Wt 213 lb (96.616 kg)  BMI 28.11 kg/m2        Assessment & Plan:  1. Rectal  pain -The patient will continue with his high fiber diet and will use the product of thumb HC twice daily as directed - Ambulatory referral to Gastroenterology  2. Anal bleeding -Use Proctofoam HC twice daily and continue with high fiber diet - Ambulatory referral to Gastroenterology  Meds ordered this encounter  Medications  . hydrocortisone-pramoxine (PROCTOFOAM HC) rectal foam    Sig: Place 1 applicator rectally 2 (two) times daily.    Dispense:  10 g    Refill:  0   Patient Instructions  Continue with high fiber diet and lots of fluids Use Proctofoam HC twice daily as directed We will arrange an appointment for you to see the gastroenterologist for further evaluation of this rectal pain   Arrie Senate MD

## 2014-09-03 NOTE — Patient Instructions (Signed)
Continue with high fiber diet and lots of fluids Use Proctofoam HC twice daily as directed We will arrange an appointment for you to see the gastroenterologist for further evaluation of this rectal pain

## 2014-09-11 ENCOUNTER — Telehealth: Payer: Self-pay

## 2014-09-11 ENCOUNTER — Ambulatory Visit (INDEPENDENT_AMBULATORY_CARE_PROVIDER_SITE_OTHER): Payer: Medicare Other | Admitting: Internal Medicine

## 2014-09-11 ENCOUNTER — Encounter (INDEPENDENT_AMBULATORY_CARE_PROVIDER_SITE_OTHER): Payer: Self-pay | Admitting: Internal Medicine

## 2014-09-11 VITALS — BP 128/74 | HR 80 | Temp 97.9°F | Ht 73.0 in | Wt 212.5 lb

## 2014-09-11 DIAGNOSIS — K629 Disease of anus and rectum, unspecified: Secondary | ICD-10-CM | POA: Diagnosis not present

## 2014-09-11 DIAGNOSIS — K602 Anal fissure, unspecified: Secondary | ICD-10-CM

## 2014-09-11 MED ORDER — DILTIAZEM GEL 2 %: 1.0000 "application " | Freq: Two times a day (BID) | CUTANEOUS | Status: DC

## 2014-09-11 NOTE — Telephone Encounter (Signed)
Changed to Proctozone use as directed

## 2014-09-11 NOTE — Progress Notes (Signed)
Subjective:    Patient ID: Mark Benjamin, male    DOB: [birth year], 54 y.o.   MRN: [id number]  HPI Presents today with c/o rectal pain. States he saw Dr. Laurance Flatten and has used Proctofoam and anusol which really did not help He says he stool was positive for blood. The rectal pain comes and goes. The pain starts after he has a BM.  He drinks prune juice for his BM. He sometimes has to strain to have a BM. Appetite is good. No weight loss. He has actually gained weight since 2014. Usually has a BM daily. He has been eating Fiber one for his BMs. Last colonoscopy was in 2014.    Procedure:07/10/2012 Colonoscopy  Indications: Patient is 54 year old Caucasian male who was noted to have abnormal exam by Dr. Redge Gainer. He gives history of occasional hematochezia when he is constipated. His last colonoscopy was about 5 years ago with removal of single small polyp.   history is negative for colorectal carcinoma. Examination performed to cecum. Small cecal polyp ablated via cold biopsy. Two small diverticula at sigmoid colon. Small external hemorrhoids and 2 anal papillae but no evidence of rectal polyp or a mass Rehman, MD on 07/18/2012 at 3:03 PM Cecal polyp is nonadenomatous. Results reviewed with patient. Next colonoscopy in 10 years.  Review of Systems Past Medical History  Diagnosis Date  . Essential hypertension, benign   . Other and unspecified hyperlipidemia   . 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 Schizophrenia   . Hyperplasia of prostate   . Fatty liver   . Colon polyps   . Dysphagia     Past Surgical History  Procedure Laterality Date  . Upper gastrointestinal endoscopy  EGD ED    09/09/2010  . Colonoscopy  02/13/08  . Colonoscopy N/A 07/10/2012    Procedure: COLONOSCOPY;  Surgeon: Rogene Houston, MD;  Location: [facility];  Service: Endoscopy;  Laterality: N/A;  225    Allergies  Allergen Reactions  . Amoxicillin     Dizzy and  blurred vision  . Erythromycin   . Zocor [Simvastatin]     Current Outpatient Prescriptions on File Prior to Visit  Medication Sig Dispense Refill  . benztropine (COGENTIN) 1 MG tablet Take 1 tablet by mouth 2 (two) times daily.    . Cholecalciferol (VITAMIN D3) 1000 UNITS CAPS Take 3 tablets by mouth daily.     . divalproex (DEPAKOTE) 250 MG DR tablet Take 250mg  in the am and 500 mg in the pm    . fosinopril-hydrochlorothiazide (MONOPRIL-HCT) 10-12.5 MG per tablet Take 1 tablet by mouth daily. 90 tablet 1  . Omega-3 Fatty Acids (FISH OIL) 1000 MG CPDR Take 2 capsules by mouth daily.    . pioglitazone (ACTOS) 15 MG tablet Take 1 tablet (15 mg total) by mouth daily. 90 tablet 1  . risperiDONE (RISPERDAL) 2 MG tablet Take 1 tablet by mouth 2 (two) times daily.    . rosuvastatin (CRESTOR) 20 MG tablet Take 1 tablet (20 mg total) by mouth daily. 90 tablet 1   No current facility-administered medications on file prior to visit.   Single, disabled. No children.      Objective:   Physical Exam Blood pressure 128/74, pulse 80, temperature 97.9 F (36.6 C), height 6\' 1"  (1.854 m), weight 212 lb 8 oz (96.389 kg). Alert and oriented. Skin warm and dry. Oral mucosa is moist.   . Sclera anicteric, conjunctivae is pink. Thyroid  not enlarged. No cervical lymphadenopathy. Lungs clear. Heart regular rate and rhythm.  Abdomen is soft. Bowel sounds are positive. No hepatomegaly. No abdominal masses felt. No tenderness.  No edema to lower extremities.  Stool brown and guaiac positive. Rectal exam painful to patient.     Developer: [id number] ( 01/2018) Card: Lot [id number] (12/18)    Assessment & Plan:  Rectal pain. ? Fissure. Rectal exam was very painful to him at [place]. He was guaiac positive today in office.  Am going to try Cardizem 2% gel. Apply pea size amt to rectum twice a day. OV in 6 weeks. If not better, may consider sigmoidoscopy.

## 2014-09-11 NOTE — Telephone Encounter (Signed)
Insurance won't pay for proctofoam but will pay for proctozone   Can you change??

## 2014-09-11 NOTE — Patient Instructions (Addendum)
Diltiazem 2% gel to rectum BID OV in 6 weeks.

## 2014-09-12 MED ORDER — HYDROCORTISONE 2.5 % RE CREA: 1.0000 "application " | TOPICAL_CREAM | Freq: Two times a day (BID) | RECTAL | Status: DC

## 2014-09-12 NOTE — Addendum Note (Signed)
Addended by: Zannie Cove on: 09/12/2014 10:39 AM   Modules accepted: Orders

## 2014-10-02 ENCOUNTER — Telehealth (INDEPENDENT_AMBULATORY_CARE_PROVIDER_SITE_OTHER): Payer: Self-pay | Admitting: *Deleted

## 2014-10-02 NOTE — Telephone Encounter (Signed)
He was treated for a fissure last month and really not better.  He wanted to know what to do on voice mail.  He needs an appt.  [phone]

## 2014-10-09 ENCOUNTER — Other Ambulatory Visit: Payer: Self-pay | Admitting: Family Medicine

## 2014-10-09 NOTE — Telephone Encounter (Signed)
States his rectal pain is better. Using medication once a day. Will see [id number]

## 2014-10-23 ENCOUNTER — Ambulatory Visit (INDEPENDENT_AMBULATORY_CARE_PROVIDER_SITE_OTHER): Payer: Medicare Other | Admitting: Internal Medicine

## 2014-10-23 ENCOUNTER — Other Ambulatory Visit (INDEPENDENT_AMBULATORY_CARE_PROVIDER_SITE_OTHER): Payer: Self-pay | Admitting: Internal Medicine

## 2014-10-23 ENCOUNTER — Encounter (INDEPENDENT_AMBULATORY_CARE_PROVIDER_SITE_OTHER): Payer: Self-pay | Admitting: Internal Medicine

## 2014-10-23 VITALS — BP 128/74 | HR 84 | Temp 98.0°F | Ht 73.0 in | Wt 207.0 lb

## 2014-10-23 DIAGNOSIS — K602 Anal fissure, unspecified: Secondary | ICD-10-CM

## 2014-10-23 DIAGNOSIS — K629 Disease of anus and rectum, unspecified: Secondary | ICD-10-CM

## 2014-10-23 MED ORDER — DILTIAZEM GEL 2 %: 1.0000 "application " | Freq: Two times a day (BID) | CUTANEOUS | Status: DC

## 2014-10-23 NOTE — Telephone Encounter (Signed)
Rx for Diltazem refilled

## 2014-10-23 NOTE — Progress Notes (Signed)
Subjective:    Patient ID: NTHONY LEFFERTS, male    DOB: [birth year], 54 y.o.   MRN: [id number]  HPIHere today for f/u. He was last seen last month for rectal pain. Rectal pain after hving a BM. I started him on Cardizem 2% gel for a possible fissure.  He says he has been doing well. He says he had a hard stool Monday and he began to have rectal pain. Right now he is not having any rectal pain. He says this morning he had some rectal pain after he had a BM.  No melena or BRRB. Usually has one BM a day.  Appetite is good.  Weight in July 212. Today his weight is 207.  No abdominal pain. He is eating a lot of fruits and vegetables.   Last colonoscopy was in 2014.    Procedure:07/10/2012 Colonoscopy  Indications: Patient is 54 year old Caucasian male who was noted to have abnormal exam by Dr. Redge Gainer. He gives history of occasional hematochezia when he is constipated. His last colonoscopy was about 5 years ago with removal of single small polyp.  history is negative for colorectal carcinoma. Examination performed to cecum. Small cecal polyp ablated via cold biopsy. Two small diverticula at sigmoid colon. Small external hemorrhoids and 2 anal papillae but no evidence of rectal polyp or a mass Rehman, MD on 07/18/2012 at 3:03 PM Cecal polyp is nonadenomatous. Results reviewed with patient. Next colonoscopy in 10 years.   Review of Systems Past Medical History  Diagnosis Date  . Essential hypertension, benign   . Other and unspecified hyperlipidemia   . 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 Schizophrenia   . Hyperplasia of prostate   . Fatty liver   . Colon polyps   . Dysphagia     Past Surgical History  Procedure Laterality Date  . Upper gastrointestinal endoscopy  EGD ED    09/09/2010  . Colonoscopy  02/13/08  . Colonoscopy N/A 07/10/2012    Procedure: COLONOSCOPY;  Surgeon: Rogene Houston, MD;  Location: [facility];  Service: Endoscopy;   Laterality: N/A;  225    Allergies  Allergen Reactions  . Amoxicillin     Dizzy and blurred vision  . Erythromycin   . Zocor [Simvastatin]     Current Outpatient Prescriptions on File Prior to Visit  Medication Sig Dispense Refill  . benztropine (COGENTIN) 1 MG tablet Take 1 tablet by mouth 2 (two) times daily.    . Cholecalciferol (VITAMIN D3) 1000 UNITS CAPS Take 3 tablets by mouth daily.     Marland Kitchen diltiazem 2 % GEL Apply 1 application topically 2 (two) times daily. 30 g 2  . divalproex (DEPAKOTE) 250 MG DR tablet Take 250mg  in the am and 500 mg in the pm    . fosinopril-hydrochlorothiazide (MONOPRIL-HCT) 10-12.5 MG per tablet TAKE ONE TABLET BY MOUTH ONCE DAILY 90 tablet 1  . hydrocortisone (ANUSOL-HC) 2.5 % rectal cream Place 1 application rectally 2 (two) times daily. 30 g 0  . Omega-3 Fatty Acids (FISH OIL) 1000 MG CPDR Take 2 capsules by mouth daily.    . pioglitazone (ACTOS) 15 MG tablet TAKE ONE TABLET BY MOUTH ONCE DAILY 90 tablet 0  . risperiDONE (RISPERDAL) 2 MG tablet Take 1 tablet by mouth 2 (two) times daily.    . rosuvastatin (CRESTOR) 20 MG tablet Take 1 tablet (20 mg total) by mouth daily. 90 tablet 1   No current facility-administered medications on  file prior to visit.        Objective:   Physical ExamBlood pressure 128/74, pulse 84, temperature 98 F (36.7 C), height 6\' 1"  (1.854 m), weight 207 lb (93.895 kg).  Alert and oriented. Skin warm and dry. Oral mucosa is moist.   . Sclera anicteric, conjunctivae is pink. Thyroid not enlarged. No cervical lymphadenopathy. Lungs clear. Heart regular rate and rhythm.  Abdomen is soft. Bowel sounds are positive. No hepatomegaly. No abdominal masses felt. No tenderness.  No edema to lower extremities.  Rectal exam: tenderness about 5 '0' clock.        Assessment & Plan:  Rectal fissure. He has been doing better. Some tenderness today on rectal exam. He will continue the Cardizem gel BID. Patient needs to be on a stool  softener daily. OV in 3 months. Refill on his Cardizem 2% sent to [facility]

## 2014-10-23 NOTE — Patient Instructions (Addendum)
Stool softener daily. Continue the Cardizem gel 2% OV in 3 months

## 2014-12-22 ENCOUNTER — Other Ambulatory Visit: Payer: Self-pay | Admitting: Family Medicine

## 2014-12-23 ENCOUNTER — Encounter: Payer: Self-pay | Admitting: Family Medicine

## 2014-12-23 ENCOUNTER — Ambulatory Visit (INDEPENDENT_AMBULATORY_CARE_PROVIDER_SITE_OTHER): Payer: Medicare Other | Admitting: Family Medicine

## 2014-12-23 VITALS — BP 118/87 | HR 80 | Temp 97.2°F | Ht 73.0 in | Wt 207.0 lb

## 2014-12-23 DIAGNOSIS — K629 Disease of anus and rectum, unspecified: Secondary | ICD-10-CM | POA: Diagnosis not present

## 2014-12-23 DIAGNOSIS — N400 Enlarged prostate without lower urinary tract symptoms: Secondary | ICD-10-CM | POA: Diagnosis not present

## 2014-12-23 DIAGNOSIS — I10 Essential (primary) hypertension: Secondary | ICD-10-CM | POA: Diagnosis not present

## 2014-12-23 DIAGNOSIS — E785 Hyperlipidemia, unspecified: Secondary | ICD-10-CM

## 2014-12-23 DIAGNOSIS — F209 Schizophrenia, unspecified: Secondary | ICD-10-CM | POA: Diagnosis not present

## 2014-12-23 DIAGNOSIS — Z23 Encounter for immunization: Secondary | ICD-10-CM | POA: Diagnosis not present

## 2014-12-23 DIAGNOSIS — M5090 Cervical disc disorder, unspecified, unspecified cervical region: Secondary | ICD-10-CM | POA: Diagnosis not present

## 2014-12-23 DIAGNOSIS — G2119 Other drug induced secondary parkinsonism: Secondary | ICD-10-CM | POA: Diagnosis not present

## 2014-12-23 DIAGNOSIS — M4802 Spinal stenosis, cervical region: Secondary | ICD-10-CM

## 2014-12-23 DIAGNOSIS — K602 Anal fissure, unspecified: Secondary | ICD-10-CM

## 2014-12-23 DIAGNOSIS — E559 Vitamin D deficiency, unspecified: Secondary | ICD-10-CM

## 2014-12-23 NOTE — Progress Notes (Signed)
Subjective:    Patient ID: Mark Benjamin, male    DOB: [birth year], 54 y.o.   MRN: [id number]  HPI Pt here for follow up and management of chronic medical problems which includes schizophrenia, hyperlipidemia, and hypertension. He is taking medications regularly. The patient's main complaint today is the discomfort in his left arm. He does have a history of spinal stenosis and has seen Dr. Leta Baptist the neurologist in the past. He is also seeing the gastroenterologist regarding his anal fissure and this seems to be doing better. Socially he has moved to [place]. His sister and brother-in-law are now living in his home and are here to help him more. The patient denies chest pain. He does have some shortness of breath with exertion but he thinks this is secondary to disc being out of shape. He has no trouble swallowing his food and no heartburn or indigestion. He still has occasional constipation and sometimes has blood in the stool because of the anal fissure. He's passing his water without problems.      Patient Active Problem List   Diagnosis Date Noted  . Metabolic syndrome 77/01/6578  . Hypertension 04/02/2013  . Hyperlipidemia 04/02/2013  . BPH (benign prostatic hyperplasia) 04/02/2013  . Left arm weakness 11/21/2012  . Parkinsonism ([place]) 11/21/2012  . Neck pain 11/21/2012  . Colon adenomas 06/29/2012  . Rectal mass 06/29/2012  . Schizophrenia ([place]) 06/22/2012  . Dysphagia 06/22/2011  . Eosinophilic esophagitis 03/83/3383   Outpatient Encounter Prescriptions as of 12/23/2014  Medication Sig  . benztropine (COGENTIN) 1 MG tablet Take 1 tablet by mouth 2 (two) times daily.  . Cholecalciferol (VITAMIN D3) 1000 UNITS CAPS Take 3 tablets by mouth daily.   . CRESTOR 20 MG tablet TAKE ONE TABLET BY MOUTH ONCE DAILY  . diltiazem 2 % GEL Apply 1 application topically 2 (two) times daily.  . divalproex (DEPAKOTE) 250 MG DR tablet Take 214m in the am and 500 mg in the pm  .  fosinopril-hydrochlorothiazide (MONOPRIL-HCT) 10-12.5 MG per tablet TAKE ONE TABLET BY MOUTH ONCE DAILY  . hydrocortisone (ANUSOL-HC) 2.5 % rectal cream Place 1 application rectally 2 (two) times daily.  . Omega-3 Fatty Acids (FISH OIL) 1000 MG CPDR Take 2 capsules by mouth daily.  . pioglitazone (ACTOS) 15 MG tablet TAKE ONE TABLET BY MOUTH ONCE DAILY  . risperiDONE (RISPERDAL) 2 MG tablet Take 1 tablet by mouth 2 (two) times daily.   No facility-administered encounter medications on file as of 12/23/2014.      Review of Systems  Constitutional: Negative.   HENT: Negative.   Eyes: Negative.   Respiratory: Negative.   Cardiovascular: Negative.   Gastrointestinal: Negative.   Endocrine: Negative.   Genitourinary: Negative.   Musculoskeletal: Positive for arthralgias (left arm and fingers).  Skin: Negative.   Allergic/Immunologic: Negative.   Neurological: Negative.   Hematological: Negative.   Psychiatric/Behavioral: Negative.        Objective:   Physical Exam  Constitutional: He is oriented to person, place, and time. He appears well-developed and well-nourished. No distress.  The patient seems more calm and relaxed today and is alert. He still has somewhat rigid movements and I assisted him with putting a shirt back on.  HENT:  Head: Normocephalic and atraumatic.  Right Ear: External ear normal.  Left Ear: External ear normal.  Nose: Nose normal.  Mouth/Throat: Oropharynx is clear and moist. No oropharyngeal exudate.  Eyes: Conjunctivae and EOM are normal. Pupils are equal, round, and reactive to  light. Right eye exhibits no discharge. Left eye exhibits no discharge. No scleral icterus.  Neck: Normal range of motion. Neck supple. No thyromegaly present.  No carotid bruits, anterior cervical adenopathy, or thyromegaly.  Cardiovascular: Normal rate, regular rhythm, normal heart sounds and intact distal pulses.   No murmur heard. The rhythm is regular at 84/m    Pulmonary/Chest: Effort normal and breath sounds normal. No respiratory distress. He has no wheezes. He has no rales. He exhibits no tenderness.  Clear anteriorly and posteriorly  Abdominal: Soft. Bowel sounds are normal. He exhibits no mass. There is no tenderness. There is no rebound and no guarding.  No inguinal adenopathy. Soft without masses or tenderness  Musculoskeletal: Normal range of motion. He exhibits no edema or tenderness.  Lymphadenopathy:    He has no cervical adenopathy.  Neurological: He is alert and oriented to person, place, and time. He has normal reflexes. No cranial nerve deficit.  The patient has generalized rigidity and continues to have somewhat of a flat affect. This however is improved.  Skin: Skin is warm and dry. No rash noted. No erythema. No pallor.  Tinea unguium left great toe  Psychiatric: He has a normal mood and affect. His behavior is normal. Judgment and thought content normal.  Flat affect but alert.  Nursing note and vitals reviewed.  BP 118/87 mmHg  Pulse 80  Temp(Src) 97.2 F (36.2 C) (Oral)  Ht '6\' 1"'  (1.854 m)  Wt 207 lb (93.895 kg)  BMI 27.32 kg/m2        Assessment & Plan:  1. Schizophrenia, unspecified type ([place]) -Continue to follow-up with psychiatrist at mental health - CBC with Differential/Platelet  2. Vitamin D deficiency -Continue current treatment pending results of lab work - CBC with Differential/Platelet - Vit D  25 hydroxy (rtn osteoporosis monitoring)  3. Hyperlipidemia -Continue aggressive therapeutic lifestyle changes, Crestor and omega-3 fatty acids as well as increase exercise more - CBC with Differential/Platelet - NMR, lipoprofile  4. Essential hypertension -The blood pressure is excellent today and the patient will continue with his current treatment - BMP8+EGFR - Hepatic function panel - CBC with Differential/Platelet  5. BPH (benign prostatic hyperplasia) -He is having no problems passing his  water. - CBC with Differential/Platelet  6. 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 Cervical disc disease -We will make an appointment for him to see the neurosurgeon because of the continued discomfort and weakness in the left arm.  8. Rectal fissure -Continue to follow-up with gastroenterology and use stool softener  9. Spinal stenosis of cervical region -Follow up with neurosurgeon and an appointment will be scheduled.  Patient Instructions                       Medicare Annual Wellness Visit  [facility] and the medical providers at [facility] strive to bring you the best medical care.  In doing so we not only want to address your current medical conditions and concerns but also to detect new conditions early and prevent illness, disease and health-related problems.    Medicare offers a yearly Wellness Visit which allows our clinical staff to assess your need for preventative services including immunizations, lifestyle education, counseling to decrease risk of preventable diseases and screening for fall risk and other medical concerns.    This visit is provided free of charge (no copay) for all Medicare recipients. The clinical pharmacists at [facility] have begun to conduct these Wellness Visits which will also include a thorough review of all your medications.    As you primary medical provider recommend that you make an appointment for your Annual Wellness Visit if you have not done so already this year.  You may set up this appointment before you leave today or you may call back ([phone]) and schedule an appointment.  Please make sure when you call that you mention that you are scheduling your Annual Wellness Visit with the clinical pharmacist so that the appointment may be made for the proper length of time.    Continue current medications. Continue good therapeutic lifestyle  changes which include good diet and exercise. Fall precautions discussed with patient. If an FOBT was given today- please return it to our front desk. If you are over 21 years old - you may need Prevnar 43 or the adult Pneumonia vaccine.  **Flu shots will be available soon--- please call and schedule a FLU-CLINIC appointment**  After your visit with [place] today you will receive a survey in the mail or online from [employer] regarding your care with [place]. Please take a moment to fill this out. Your feedback is very important to [place] as you can help [place] better understand your patient needs as well as improve your experience and satisfaction. WE CARE ABOUT YOU!!!   Stay as active as possible Continue to use stool softener and drink plenty of fluids Be sure and make an appointment with the podiatrist for trimming the left great toenail We will arrange for you to see the neurosurgeon regarding your continued discomfort in the left arm and with your history of spinal stenosis Continue to follow-up with psychiatrist for schizophrenia   Arrie Senate MD

## 2014-12-23 NOTE — Patient Instructions (Addendum)
Medicare Annual Wellness Visit  [facility] and the medical providers at [facility] strive to bring you the best medical care.  In doing so we not only want to address your current medical conditions and concerns but also to detect new conditions early and prevent illness, disease and health-related problems.    Medicare offers a yearly Wellness Visit which allows our clinical staff to assess your need for preventative services including immunizations, lifestyle education, counseling to decrease risk of preventable diseases and screening for fall risk and other medical concerns.    This visit is provided free of charge (no copay) for all Medicare recipients. The clinical pharmacists at [facility] have begun to conduct these Wellness Visits which will also include a thorough review of all your medications.    As you primary medical provider recommend that you make an appointment for your Annual Wellness Visit if you have not done so already this year.  You may set up this appointment before you leave today or you may call back ([phone]) and schedule an appointment.  Please make sure when you call that you mention that you are scheduling your Annual Wellness Visit with the clinical pharmacist so that the appointment may be made for the proper length of time.    Continue current medications. Continue good therapeutic lifestyle changes which include good diet and exercise. Fall precautions discussed with patient. If an FOBT was given today- please return it to our front desk. If you are over 68 years old - you may need Prevnar 65 or the adult Pneumonia vaccine.  **Flu shots will be available soon--- please call and schedule a FLU-CLINIC appointment**  After your visit with [place] today you will receive a survey in the mail or online from [employer] regarding your care with [place]. Please take a moment to fill this out. Your feedback is very  important to [place] as you can help [place] better understand your patient needs as well as improve your experience and satisfaction. WE CARE ABOUT YOU!!!   Stay as active as possible Continue to use stool softener and drink plenty of fluids Be sure and make an appointment with the podiatrist for trimming the left great toenail We will arrange for you to see the neurosurgeon regarding your continued discomfort in the left arm and with your history of spinal stenosis Continue to follow-up with psychiatrist for schizophrenia

## 2014-12-24 LAB — CBC WITH DIFFERENTIAL/PLATELET
BASOS ABS: 0 10*3/uL (ref 0.0–0.2)
Basos: 1 %
EOS (ABSOLUTE): 0.1 10*3/uL (ref 0.0–0.4)
EOS: 3 %
HEMATOCRIT: 45.1 % (ref 37.5–51.0)
HEMOGLOBIN: 15.3 g/dL (ref 12.6–17.7)
IMMATURE GRANS (ABS): 0 10*3/uL (ref 0.0–0.1)
Immature Granulocytes: 0 %
LYMPHS ABS: 1.2 10*3/uL (ref 0.7–3.1)
LYMPHS: 28 %
MCH: 31.5 pg (ref 26.6–33.0)
MCHC: 33.9 g/dL (ref 31.5–35.7)
MCV: 93 fL (ref 79–97)
MONOCYTES: 15 %
Monocytes Absolute: 0.7 10*3/uL (ref 0.1–0.9)
NEUTROS ABS: 2.3 10*3/uL (ref 1.4–7.0)
Neutrophils: 53 %
Platelets: 215 10*3/uL (ref 150–379)
RBC: 4.85 x10E6/uL (ref 4.14–5.80)
RDW: 13 % (ref 12.3–15.4)
WBC: 4.3 10*3/uL (ref 3.4–10.8)

## 2014-12-24 LAB — BMP8+EGFR
BUN/Creatinine Ratio: 6 — ABNORMAL LOW (ref 9–20)
BUN: 6 mg/dL (ref 6–24)
CALCIUM: 9.7 mg/dL (ref 8.7–10.2)
CHLORIDE: 96 mmol/L — AB (ref 97–108)
CO2: 28 mmol/L (ref 18–29)
Creatinine, Ser: 0.94 mg/dL (ref 0.76–1.27)
GFR calc non Af Amer: 92 mL/min/{1.73_m2} (ref >59)
GFR, EST AFRICAN AMERICAN: 106 mL/min/{1.73_m2} (ref >59)
GLUCOSE: 96 mg/dL (ref 65–99)
POTASSIUM: 4.3 mmol/L (ref 3.5–5.2)
Sodium: 137 mmol/L (ref 134–144)

## 2014-12-24 LAB — NMR, LIPOPROFILE
CHOLESTEROL: 112 mg/dL (ref 100–199)
HDL Cholesterol by NMR: 50 mg/dL (ref >39)
HDL PARTICLE NUMBER: 34.6 umol/L (ref >=30.5)
LDL PARTICLE NUMBER: 328 nmol/L (ref <1000)
LDL-C: 45 mg/dL (ref 0–99)
LP-IR Score: <25 (ref <=45)
Small LDL Particle Number: <90 nmol/L (ref <=527)
TRIGLYCERIDES BY NMR: 85 mg/dL (ref 0–149)

## 2014-12-24 LAB — HEPATIC FUNCTION PANEL
ALBUMIN: 4.6 g/dL (ref 3.5–5.5)
ALT: 32 IU/L (ref 0–44)
AST: 33 IU/L (ref 0–40)
Alkaline Phosphatase: 63 IU/L (ref 39–117)
BILIRUBIN TOTAL: 1.9 mg/dL — AB (ref 0.0–1.2)
BILIRUBIN, DIRECT: 0.35 mg/dL (ref 0.00–0.40)
TOTAL PROTEIN: 6.8 g/dL (ref 6.0–8.5)

## 2014-12-24 LAB — VITAMIN D 25 HYDROXY (VIT D DEFICIENCY, FRACTURES): Vit D, 25-Hydroxy: 47.8 ng/mL (ref 30.0–100.0)

## 2014-12-24 NOTE — Addendum Note (Signed)
Addended by: Zannie Cove on: 12/24/2014 10:49 AM   Modules accepted: Orders

## 2015-01-08 ENCOUNTER — Other Ambulatory Visit: Payer: Medicare Other

## 2015-01-08 DIAGNOSIS — Z1212 Encounter for screening for malignant neoplasm of rectum: Secondary | ICD-10-CM

## 2015-01-08 NOTE — Progress Notes (Signed)
Lab only 

## 2015-01-10 LAB — FECAL OCCULT BLOOD, IMMUNOCHEMICAL: FECAL OCCULT BLD: NEGATIVE

## 2015-01-13 ENCOUNTER — Other Ambulatory Visit: Payer: Self-pay | Admitting: Family Medicine

## 2015-01-17 ENCOUNTER — Other Ambulatory Visit: Payer: Self-pay | Admitting: Family Medicine

## 2015-01-17 NOTE — Telephone Encounter (Signed)
Last A1C 10/2013

## 2015-01-21 ENCOUNTER — Other Ambulatory Visit: Payer: Self-pay | Admitting: *Deleted

## 2015-01-21 DIAGNOSIS — M79642 Pain in left hand: Secondary | ICD-10-CM

## 2015-01-29 ENCOUNTER — Encounter (INDEPENDENT_AMBULATORY_CARE_PROVIDER_SITE_OTHER): Payer: Self-pay | Admitting: Internal Medicine

## 2015-01-29 ENCOUNTER — Ambulatory Visit (INDEPENDENT_AMBULATORY_CARE_PROVIDER_SITE_OTHER): Payer: Medicare Other | Admitting: Internal Medicine

## 2015-01-29 VITALS — BP 152/70 | HR 80 | Temp 97.9°F | Ht 73.0 in | Wt 208.0 lb

## 2015-01-29 DIAGNOSIS — K6289 Other specified diseases of anus and rectum: Secondary | ICD-10-CM

## 2015-01-29 NOTE — Progress Notes (Signed)
Subjective:    Patient ID: Mark Benjamin, male    DOB: 1960-09-22, 54 y.o.   MRN: [id number]  HPI Here today for f/u. He was last seen in August. Weight in August was 207    Patient ID: Mark Benjamin, male DOB: 25-Jun-1960, 54 y.o. MRN: [id number]  HPIHere today for f/u of his rectal pain. He denies any rectal pain today. He says when he has a hard BM, he will have rectal pain. The pain will last about 1/2 day and then resolved. He takes a stool softener daily.Marland Kitchen He was last seen last month for rectal pain. Rectal pain   He uses the Cardizem 2% gel for a possible fissure.  He says he has been doing well. He says he had a hard stool Monday and he began to have rectal pain. Right now he is not having any rectal pain. Appetite is good. No dysphagia.Weight in July 212. Today his weight is 208.  No abdominal pain. Usually has a BM daily. He occasionally will skip a day. No melena or BRRB. He is eating a lot of fruits and vegetables.  Last colonoscopy was in 2014.    Procedure:07/10/2012 Colonoscopy  Indications: Patient is 54 year old Caucasian male who was noted to have abnormal exam by Dr. Redge Gainer. He gives history of occasional hematochezia when he is constipated. His last colonoscopy was about 5 years ago with removal of single small polyp.  history is negative for colorectal carcinoma. Examination performed to cecum. Small cecal polyp ablated via cold biopsy. Two small diverticula at sigmoid colon. Small external hemorrhoids and 2 anal papillae but no evidence of rectal polyp or a mass Rehman, MD on 07/18/2012 at 3:03 PM Cecal polyp is nonadenomatous. Results reviewed with patient. Next colonoscopy in 10 years.    Review of Systems Past Medical History  Diagnosis Date  . Essential hypertension, benign   . Other and unspecified hyperlipidemia   . 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 Schizophrenia ([place])   . Hyperplasia of prostate   .  Fatty liver   . Colon polyps   . Dysphagia     Past Surgical History  Procedure Laterality Date  . Upper gastrointestinal endoscopy  EGD ED    09/09/2010  . Colonoscopy  02/13/08  . Colonoscopy N/A 07/10/2012    Procedure: COLONOSCOPY;  Surgeon: Rogene Houston, MD;  Location: [facility];  Service: Endoscopy;  Laterality: N/A;  225    Allergies  Allergen Reactions  . Amoxicillin     Dizzy and blurred vision  . Erythromycin   . Zocor [Simvastatin]     Current Outpatient Prescriptions on File Prior to Visit  Medication Sig Dispense Refill  . benztropine (COGENTIN) 1 MG tablet Take 1 tablet by mouth 2 (two) times daily.    . Cholecalciferol (VITAMIN D3) 1000 UNITS CAPS Take 3 tablets by mouth daily.     . CRESTOR 20 MG tablet TAKE ONE TABLET BY MOUTH ONCE DAILY 90 tablet 0  . diltiazem 2 % GEL Apply 1 application topically 2 (two) times daily. 30 g 2  . divalproex (DEPAKOTE) 250 MG DR tablet Take 250mg  in the am and 500 mg in the pm    . fosinopril-hydrochlorothiazide (MONOPRIL-HCT) 10-12.5 MG per tablet TAKE ONE TABLET BY MOUTH ONCE DAILY 90 tablet 1  . hydrocortisone (ANUSOL-HC) 2.5 % rectal cream Place 1 application rectally 2 (two) times daily. 30 g 0  . Omega-3 Fatty Acids (  FISH OIL) 1000 MG CPDR Take 2 capsules by mouth daily.    . pioglitazone (ACTOS) 15 MG tablet TAKE ONE TABLET BY MOUTH ONCE DAILY 90 tablet 0  . risperiDONE (RISPERDAL) 2 MG tablet Take 1 tablet by mouth 2 (two) times daily.     No current facility-administered medications on file prior to visit.        Objective:   Physical ExamBlood pressure 152/70, pulse 80, temperature 97.9 F (36.6 C), Benjamin 6\' 1"  (1.854 m), weight 208 lb (94.348 kg).  Alert and oriented. Skin warm and dry. Oral mucosa is moist.   . Sclera anicteric, conjunctivae is pink. Thyroid not enlarged. No cervical lymphadenopathy. Lungs clear. Heart regular rate and rhythm.  Abdomen is soft. Bowel sounds are positive. No hepatomegaly.  No abdominal masses felt. No tenderness.  No edema to lower extremities.         Assessment & Plan:  Rectal pain, possible fissure. Needs to use stool softeners twice a day. OV in 1 year.

## 2015-01-29 NOTE — Patient Instructions (Signed)
Stool softener. One in am an one in pm. OV in 1 year.

## 2015-02-03 NOTE — Progress Notes (Signed)
Quick Note:  Patient notified of FOBT results ______

## 2015-02-12 ENCOUNTER — Ambulatory Visit (INDEPENDENT_AMBULATORY_CARE_PROVIDER_SITE_OTHER): Payer: Medicare Other | Admitting: Family Medicine

## 2015-02-12 ENCOUNTER — Encounter: Payer: Self-pay | Admitting: Family Medicine

## 2015-02-12 VITALS — BP 123/75 | HR 88 | Temp 97.4°F | Ht 73.0 in | Wt 209.0 lb

## 2015-02-12 DIAGNOSIS — E785 Hyperlipidemia, unspecified: Secondary | ICD-10-CM

## 2015-02-12 DIAGNOSIS — I10 Essential (primary) hypertension: Secondary | ICD-10-CM | POA: Diagnosis not present

## 2015-02-12 DIAGNOSIS — F209 Schizophrenia, unspecified: Secondary | ICD-10-CM | POA: Diagnosis not present

## 2015-02-12 NOTE — Progress Notes (Signed)
Subjective:    Patient ID: Mark Benjamin, male    DOB: Jul 27, 1960, 54 y.o.   MRN: [id number]  HPI Patient here today for DMV forms to be filled out. He states that he has never had to do this before, but this this time he has to because he is taking Risperdal.        Patient Active Problem List   Diagnosis Date Noted  . Metabolic syndrome 123456  . Hypertension 04/02/2013  . Hyperlipidemia 04/02/2013  . BPH (benign prostatic hyperplasia) 04/02/2013  . Left arm weakness 11/21/2012  . Parkinsonism ([place]) 11/21/2012  . Neck pain 11/21/2012  . Colon adenomas 06/29/2012  . Rectal mass 06/29/2012  . Schizophrenia ([place]) 06/22/2012  . Dysphagia 06/22/2011  . Eosinophilic esophagitis [id number]   Outpatient Encounter Prescriptions as of 02/12/2015  Medication Sig  . benztropine (COGENTIN) 1 MG tablet Take 1 tablet by mouth 2 (two) times daily.  . Cholecalciferol (VITAMIN D3) 1000 UNITS CAPS Take 3 tablets by mouth daily.   . CRESTOR 20 MG tablet TAKE ONE TABLET BY MOUTH ONCE DAILY  . diltiazem 2 % GEL Apply 1 application topically 2 (two) times daily.  . divalproex (DEPAKOTE) 250 MG DR tablet Take 250mg  in the am and 500 mg in the pm  . fosinopril-hydrochlorothiazide (MONOPRIL-HCT) 10-12.5 MG per tablet TAKE ONE TABLET BY MOUTH ONCE DAILY  . hydrocortisone (ANUSOL-HC) 2.5 % rectal cream Place 1 application rectally 2 (two) times daily.  . Omega-3 Fatty Acids (FISH OIL) 1000 MG CPDR Take 2 capsules by mouth daily.  . pioglitazone (ACTOS) 15 MG tablet TAKE ONE TABLET BY MOUTH ONCE DAILY  . risperiDONE (RISPERDAL) 2 MG tablet Take 1 tablet by mouth 2 (two) times daily.   No facility-administered encounter medications on file as of 02/12/2015.     Review of Systems  Constitutional: Positive for fatigue.  HENT: Negative.   Eyes: Negative.   Respiratory: Negative.   Cardiovascular: Negative.   Gastrointestinal: Negative.   Endocrine: Negative.   Genitourinary: Negative.     Musculoskeletal: Negative.   Skin: Negative.   Allergic/Immunologic: Negative.   Neurological: Negative.   Hematological: Negative.   Psychiatric/Behavioral: Negative.        Objective:   Physical Exam BP 123/75 mmHg  Pulse 88  Temp(Src) 97.4 F (36.3 C) (Oral)  Ht 6\' 1"  (1.854 m)  Wt 209 lb (94.802 kg)  BMI 27.58 kg/m2  About 30 minutes of time was spent with the patient completing DMV forms and with the patient responding to questions asked of him. These forms will be scanned into the record. We also discussed the completion of the forms with his sister who came with him to the visit today. We also mentioned to the patient and his sister that he may want to get the psychiatrist to attach a letter to accompany the forms when they are sent to the [facility].      Assessment & Plan:  1. Schizophrenia, unspecified type ([place]) -Continue to follow-up with mental health and the psychiatrist Dr. Hoyle Barr  2. Essential hypertension -The blood pressure is good today he should continue with current treatment  3. Hyperlipidemia -We will continue with current cholesterol treatment and therapeutic lifestyle changes  Patient Instructions  The patient should take the completed forms and return them to the [facility] Recommended getting a letter from his psychologist/psychiatrist to attach to the forms regarding how well the patient is doing currently with his current treatment. Personally from a medical standpoint I  do not recommend any restrictions on this young man He has self-imposed is on restrictions as far as driving to [place] and driving long distances.   Arrie Senate MD

## 2015-02-12 NOTE — Patient Instructions (Signed)
The patient should take the completed forms and return them to the [facility] Recommended getting a letter from his psychologist/psychiatrist to attach to the forms regarding how well the patient is doing currently with his current treatment. Personally from a medical standpoint I do not recommend any restrictions on this young man He has self-imposed is on restrictions as far as driving to [place] and driving long distances.

## 2015-03-19 ENCOUNTER — Encounter: Payer: Self-pay | Admitting: Physician Assistant

## 2015-03-19 ENCOUNTER — Ambulatory Visit (INDEPENDENT_AMBULATORY_CARE_PROVIDER_SITE_OTHER): Payer: Medicare Other | Admitting: Physician Assistant

## 2015-03-19 VITALS — BP 124/72 | HR 111 | Temp 101.4°F | Ht 73.0 in | Wt 208.4 lb

## 2015-03-19 DIAGNOSIS — R309 Painful micturition, unspecified: Secondary | ICD-10-CM | POA: Diagnosis not present

## 2015-03-19 LAB — POCT UA - MICROSCOPIC ONLY
CASTS, UR, LPF, POC: NEGATIVE
CRYSTALS, UR, HPF, POC: NEGATIVE
YEAST UA: NEGATIVE

## 2015-03-19 LAB — POCT URINALYSIS DIPSTICK
BILIRUBIN UA: NEGATIVE
GLUCOSE UA: NEGATIVE
Leukocytes, UA: NEGATIVE
NITRITE UA: NEGATIVE
PH UA: 7
SPEC GRAV UA: 1.02
Urobilinogen, UA: NEGATIVE

## 2015-03-19 MED ORDER — SULFAMETHOXAZOLE-TRIMETHOPRIM 800-160 MG PO TABS: 1.0000 | ORAL_TABLET | Freq: Two times a day (BID) | ORAL | Status: DC

## 2015-03-19 NOTE — Progress Notes (Signed)
Subjective:     Patient ID: Mark Benjamin, male   DOB: [birth year], 54 y.o.   MRN: [id number]  HPI Pt with dysuria and increase freq x 1 day Also has noticed some increase in nocturia No back pain  Review of Systems  Constitutional: Positive for fever. Negative for chills, activity change and appetite change.  Genitourinary: Positive for dysuria, urgency and decreased urine volume. Negative for frequency, hematuria, flank pain, difficulty urinating and testicular pain.       Objective:   Physical Exam  Pulmonary/Chest: Effort normal and breath sounds normal.  Abdominal: Soft. Bowel sounds are normal. He exhibits no distension and no mass. There is no tenderness. There is no rebound and no guarding.  No CVAT   Results for orders placed or performed in visit on 03/19/15  POCT urinalysis dipstick  Result Value Ref Range   Color, UA gold    Clarity, UA clear    Glucose, UA neg    Bilirubin, UA neg    Ketones, UA small    Spec Grav, UA 1.020    Blood, UA mod    pH, UA 7.0    Protein, UA 100+    Urobilinogen, UA negative    Nitrite, UA neg    Leukocytes, UA Negative Negative  POCT UA - Microscopic Only  Result Value Ref Range   WBC, Ur, HPF, POC 10-15    RBC, urine, microscopic 5-10    Bacteria, U Microscopic occ    Mucus, UA occ    Epithelial cells, urine per micros rare    Crystals, Ur, HPF, POC neg    Casts, Ur, LPF, POC neg    Yeast, UA neg    Urine culture pending     Assessment:     Dysuria- prostatitis vs UTI    Plan:     Start Bactrim DS bid x 10 day Fluids Rest Appt with [facility] next week for f/u to make sure hematuria resolves Culture report should be back by appt

## 2015-03-19 NOTE — Patient Instructions (Signed)

## 2015-03-21 LAB — URINE CULTURE

## 2015-03-25 ENCOUNTER — Telehealth: Payer: Self-pay | Admitting: Family Medicine

## 2015-03-25 MED ORDER — DOXYCYCLINE HYCLATE 100 MG PO TABS: ORAL_TABLET | ORAL | Status: DC

## 2015-03-25 NOTE — Telephone Encounter (Signed)
Discontinue sulfa and based on the urine culture he should take doxycycline 100 mg twice daily with food for 2 weeks and then recheck urinalysis when completed

## 2015-03-25 NOTE — Telephone Encounter (Signed)
Sister aware, stop bactrim , begin new script of doxycycline.  Script sent to [facility].

## 2015-03-25 NOTE — Telephone Encounter (Signed)
His sister says the antibiotic is causing patient to be less focused and being more lethargic.  He is having difficulty functioning in his apartment without calling her a lot.  This is not like him.  Could you review his medications with your pharmacist and see what other antibiotic may work for his bladder infection that would not cause this side effect.  He says that he is better and could he stop taking the medication.  Please advise after reviewing urine culture.

## 2015-03-27 ENCOUNTER — Ambulatory Visit: Payer: Medicare Other | Admitting: Family Medicine

## 2015-03-28 ENCOUNTER — Encounter: Payer: Self-pay | Admitting: Family Medicine

## 2015-03-28 DIAGNOSIS — G5602 Carpal tunnel syndrome, left upper limb: Secondary | ICD-10-CM | POA: Diagnosis not present

## 2015-03-28 DIAGNOSIS — M47812 Spondylosis without myelopathy or radiculopathy, cervical region: Secondary | ICD-10-CM | POA: Diagnosis not present

## 2015-04-07 ENCOUNTER — Other Ambulatory Visit: Payer: Self-pay | Admitting: Family Medicine

## 2015-04-14 ENCOUNTER — Telehealth: Payer: Self-pay | Admitting: Pharmacist

## 2015-04-14 ENCOUNTER — Encounter: Payer: Self-pay | Admitting: Pharmacist

## 2015-04-14 ENCOUNTER — Ambulatory Visit (INDEPENDENT_AMBULATORY_CARE_PROVIDER_SITE_OTHER): Payer: PPO | Admitting: Pharmacist

## 2015-04-14 VITALS — BP 130/74 | HR 63 | Ht 73.0 in | Wt 208.5 lb

## 2015-04-14 DIAGNOSIS — Z79899 Other long term (current) drug therapy: Secondary | ICD-10-CM

## 2015-04-14 DIAGNOSIS — Z0000 Encounter for general adult medical examination without abnormal findings: Secondary | ICD-10-CM

## 2015-04-14 DIAGNOSIS — N390 Urinary tract infection, site not specified: Secondary | ICD-10-CM

## 2015-04-14 NOTE — Telephone Encounter (Signed)
Notified patient's sister of medication changes.

## 2015-04-14 NOTE — Progress Notes (Signed)
Patient ID: DOCTOR COBAS, male   DOB: 10-29-1960, 55 y.o.   MRN: [id number]    Subjective:   Mark Benjamin is a 55 y.o. white, male who presents for an Initial Medicare Annual Wellness Visit and to review medications.  He would really like to decrease the number of medications he is taking.  Patient is single, lives alone at [facility].  His sister, [name] helps a lot with his health. Last A1c was 4.8% - patient has prediabetes but has been well controlled for last few year.  Review of Systems  Review of Systems  HENT: Negative.   Respiratory: Negative.   Cardiovascular: Negative.   Gastrointestinal: Positive for constipation (most days controlled with docusate).  Genitourinary: Negative.   Musculoskeletal: Negative.        Patient is having pain and weakness on left hand - he is seeing specialist about this condition   Skin: Negative.   Neurological: Positive for tremors, focal weakness and weakness (left hand weakness).  Endo/Heme/Allergies: Negative.       Current Medications (verified) Outpatient Encounter Prescriptions as of 04/14/2015  Medication Sig  . benztropine (COGENTIN) 1 MG tablet Take 1 tablet by mouth 2 (two) times daily.  . Cholecalciferol (VITAMIN D3) 1000 UNITS CAPS Take 3 tablets by mouth daily.   Marland Kitchen diltiazem 2 % GEL Apply 1 application topically 2 (two) times daily.  . divalproex (DEPAKOTE) 250 MG DR tablet Take 250mg  in the am and 500 mg in the pm  . docusate sodium (COLACE) 100 MG capsule Take 100 mg by mouth 2 (two) times daily.  . fosinopril-hydrochlorothiazide (MONOPRIL-HCT) 10-12.5 MG tablet TAKE ONE TABLET BY MOUTH ONCE DAILY  . Omega-3 Fatty Acids (FISH OIL) 1000 MG CPDR Take 1 capsule by mouth daily.  . risperiDONE (RISPERDAL) 2 MG tablet Take 1 tablet by mouth 2 (two) times daily.  . rosuvastatin (CRESTOR) 20 MG tablet TAKE ONE TABLET BY MOUTH ONCE DAILY  . [DISCONTINUED] pioglitazone (ACTOS) 15 MG tablet TAKE ONE TABLET BY MOUTH ONCE DAILY  .  [DISCONTINUED] doxycycline (VIBRA-TABS) 100 MG tablet 1 po bid with food (Patient not taking: Reported on 04/14/2015)  . [DISCONTINUED] hydrocortisone (ANUSOL-HC) 2.5 % rectal cream Place 1 application rectally 2 (two) times daily. (Patient not taking: Reported on 04/14/2015)  . [DISCONTINUED] sulfamethoxazole-trimethoprim (BACTRIM DS,SEPTRA DS) 800-160 MG tablet Take 1 tablet by mouth 2 (two) times daily. (Patient not taking: Reported on 04/14/2015)   No facility-administered encounter medications on file as of 04/14/2015.    Allergies (verified) Amoxicillin; Erythromycin; Sulfamethoxazole-trimethoprim; and Zocor   History: Past Medical History  Diagnosis Date  . Essential hypertension, benign   . Other and unspecified hyperlipidemia   . 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 Schizophrenia ([place])   . Hyperplasia of prostate   . Fatty liver   . Colon polyps   . Dysphagia    Past Surgical History  Procedure Laterality Date  . Upper gastrointestinal endoscopy  EGD ED    09/09/2010  . Colonoscopy  02/13/08  . Colonoscopy N/A 07/10/2012    Procedure: COLONOSCOPY;  Surgeon: Rogene Houston, MD;  Location: [facility];  Service: Endoscopy;  Laterality: N/A;  225   Family History  Problem Relation Age of Onset  . Inflammatory bowel disease Paternal Grandfather   . Heart failure Mother   . ALS Father    Social History   Occupational History  .     Social History Main Topics  .  Smoking status: Never Smoker   . Smokeless tobacco: Never Used  . Alcohol Use: Yes     Comment: very rare  . Drug Use: No  . Sexual Activity: No    Do you feel safe at home?  Yes  Dietary issues and exercise activities discussed: Current Exercise Habits:: Home exercise routine, Type of exercise: walking, Time (Minutes): 30, Frequency (Times/Week): 1, Weekly Exercise (Minutes/Week): 30, Intensity: Mild  Current Dietary habits:  Patient eats out several times per week.  Likes to  get vegetable plates - he limits his high carb vegetables.   Cardiac Risk Factors include: advanced age (>19men, >44 women);dyslipidemia;male gender;hypertension  Objective:    Today's Vitals   04/14/15 1214  BP: 130/74  Pulse: 63  Height: 6\' 1"  (1.854 m)  Weight: 208 lb 8 oz (94.575 kg)  PainSc: 0-No pain   Body mass index is 27.51 kg/(m^2).   Activities of Daily Living In your present state of health, do you have any difficulty performing the following activities: 04/14/2015  Hearing? N  Vision? Y  Difficulty concentrating or making decisions? N  Walking or climbing stairs? N  Dressing or bathing? N  Doing errands, shopping? N  Preparing Food and eating ? N  Using the Toilet? N  In the past six months, have you accidently leaked urine? N  Do you have problems with loss of bowel control? N  Managing your Medications? N  Managing your Finances? Y  Housekeeping or managing your Housekeeping? N    Are there smokers in your home (other than you)? No    Depression Screen PHQ 2/9 Scores 04/14/2015 03/19/2015 02/12/2015 12/23/2014  PHQ - 2 Score 1 0 4 3  PHQ- 9 Score - - 9 10    Fall Risk Fall Risk  04/14/2015 03/19/2015 02/12/2015 12/23/2014 09/03/2014  Falls in the past year? No No No No Yes  Number falls in past yr: - - - - 1  Injury with Fall? - - - - No    Cognitive Function: MMSE - Mini Mental State Exam 04/14/2015  Orientation to time 5  Orientation to Place 5  Registration 3  Attention/ Calculation 5  Recall 2  Language- name 2 objects 2  Language- repeat 1  Language- follow 3 step command 3  Language- read & follow direction 1  Write a sentence 0  Copy design 0  Total score 27    Immunizations and Health Maintenance Immunization History  Administered Date(s) Administered  . Influenza Whole 12/20/2009  . Influenza,inj,Quad PF,36+ Mos 12/23/2014  . Tdap 07/21/2006   There are no preventive care reminders to display for this patient.  Patient Care  Team: Chipper Herb, MD as PCP - General (Family Medicine) Butch Penny, NP as Nurse Practitioner (Internal Medicine) Cristal Deer, DPM as Consulting Physician (Podiatry) Elbert Ewings, MD (Psychiatry) Truc Manus Gunning, OD as Consulting Physician (Optometry)  Indicate any recent Medical Services you may have received from other than [facility] providers in the past year (date may be approximate).    Assessment:    Annual Wellness Visit    Screening Tests Health Maintenance  Topic Date Due  . Hepatitis C Screening  06/23/2015 (Originally Aug 10, 1960)  . HIV Screening  08/04/2016 (Originally 07/04/1975)  . INFLUENZA VACCINE  10/21/2015  . COLON CANCER SCREENING ANNUAL FOBT  01/08/2016  . TETANUS/TDAP  07/20/2016  . COLONOSCOPY  07/11/2022        Plan:   During the course of the visit  Nykel was educated and counseled about the following appropriate screening and preventive services:   Vaccines to include Pneumoccal, Influenza, Hepatitis B, Td, Zostavax - all currently requires vaccines are UTD  Colorectal cancer screening - FOBT and colonoscopy are both UTD  Cardiovascular disease screening - Last EKG 2013  BP and lipids are at goal  Medication management - discontinue pioglitazone;  Decrease fish oil to 1000mg  once daily (consider discontinue if Tg are WNL at next check)  Diabetes screening - last A1c was 4.8% amd FBG was 96.  Glaucoma screening /  Eye Exam - UTD  Nutrition counseling - Reviewed limiting high CHO containing foods and proper serving sizes  Prostate cancer screening - UTD  Advanced Directives - UTD  Physical activity - increase walking to 3 - 4 times per week or discussed joining [employer] program.      Goals    None       Patient Instructions (the written plan) were given to the patient.   Cherre Robins, [facility]   04/14/2015

## 2015-04-14 NOTE — Patient Instructions (Signed)
Mark Benjamin , Thank you for taking time to come for your Medicare Wellness Visit. I appreciate your ongoing commitment to your health goals. Please review the following plan we discussed and let me know if I can assist you in the future.   These are the goals we discussed: Stop pioglitazone Decrease Fish Oil to just 1 capsule once a day Increase physical activity / exercise - try to walk more often 3 times per week or join [employer] program / recreation center for exercise classes.   Increase non-starchy vegetables - carrots, green bean, squash, zucchini, tomatoes, onions, peppers, spinach and other green leafy vegetables, cabbage, lettuce, cucumbers, asparagus, okra (not fried), eggplant limit sugar and processed foods (cakes, cookies, ice cream, crackers and chips) Increase fresh fruit but limit serving sizes 1/2 cup or about the size of tennis or baseball limit red meat to no more than 1-2 times per week (serving size about the size of your palm) Choose whole grains / lean proteins - whole wheat bread, quinoa, whole grain rice (1/2 cup), fish, chicken, [place]   This is a list of the screening recommended for you and due dates:  Health Maintenance  Topic Date Due  .  Hepatitis C: One time screening is recommended by Center for Disease Control  (CDC) for  adults born from [birth year] through 1965.   06/23/2015* - will get with next labs  . HIV Screening  08/04/2016*  . Flu Shot  10/21/2015  . Stool Blood Test  01/08/2016  . Tetanus Vaccine  07/20/2016  . Colon Cancer Screening  07/11/2022  *Topic was postponed. The date shown is not the original due date.    Health Maintenance, Male A healthy lifestyle and preventative care can promote health and wellness.  Maintain regular health, dental, and eye exams.  Eat a healthy diet. Foods like vegetables, fruits, whole grains, low-fat dairy products, and lean protein foods contain the nutrients you need and are low in calories. Decrease your  intake of foods high in solid fats, added sugars, and salt. Get information about a proper diet from your health care provider, if necessary.  Regular physical exercise is one of the most important things you can do for your health. Most adults should get at least 150 minutes of moderate-intensity exercise (any activity that increases your heart rate and causes you to sweat) each week. In addition, most adults need muscle-strengthening exercises on 2 or more days a week.   Maintain a healthy weight. The body mass index (BMI) is a screening tool to identify possible weight problems. It provides an estimate of body fat based on height and weight. Your health care provider can find your BMI and can help you achieve or maintain a healthy weight. For males 20 years and older:  A BMI below 18.5 is considered underweight.  A BMI of 18.5 to 24.9 is normal.  A BMI of 25 to 29.9 is considered overweight.  A BMI of 30 and above is considered obese.  Maintain normal blood lipids and cholesterol by exercising and minimizing your intake of saturated fat. Eat a balanced diet with plenty of fruits and vegetables. Blood tests for lipids and cholesterol should begin at age 14 and be repeated every 5 years. If your lipid or cholesterol levels are high, you are over age 4, or you are at high risk for heart disease, you may need your cholesterol levels checked more frequently.Ongoing high lipid and cholesterol levels should be treated with medicines if diet  and exercise are not working.  If you smoke, find out from your health care provider how to quit. If you do not use tobacco, do not start.  Lung cancer screening is recommended for adults aged 66-80 years who are at high risk for developing lung cancer because of a history of smoking. A yearly low-dose CT scan of the lungs is recommended for people who have at least a 30-pack-year history of smoking and are current smokers or have quit within the past 15 years. A  pack year of smoking is smoking an average of 1 pack of cigarettes a day for 1 year (for example, a 30-pack-year history of smoking could mean smoking 1 pack a day for 30 years or 2 packs a day for 15 years). Yearly screening should continue until the smoker has stopped smoking for at least 15 years. Yearly screening should be stopped for people who develop a health problem that would prevent them from having lung cancer treatment.  If you choose to drink alcohol, do not have more than 2 drinks per day. One drink is considered to be 12 oz (360 mL) of beer, 5 oz (150 mL) of wine, or 1.5 oz (45 mL) of liquor.  Avoid the use of street drugs. Do not share needles with anyone. Ask for help if you need support or instructions about stopping the use of drugs.  High blood pressure causes heart disease and increases the risk of stroke. High blood pressure is more likely to develop in:  People who have blood pressure in the end of the normal range (100-139/85-89 mm Hg).  People who are overweight or obese.  People who are African American.  If you are 4-58 years of age, have your blood pressure checked every 3-5 years. If you are 83 years of age or older, have your blood pressure checked every year. You should have your blood pressure measured twice--once when you are at a hospital or clinic, and once when you are not at a hospital or clinic. Record the average of the two measurements. To check your blood pressure when you are not at a hospital or clinic, you can use:  An automated blood pressure machine at a pharmacy.  A home blood pressure monitor.  If you are 63-33 years old, ask your health care provider if you should take aspirin to prevent heart disease.  Diabetes screening involves taking a blood sample to check your fasting blood sugar level. This should be done once every 3 years after age 25 if you are at a normal weight and without risk factors for diabetes. Testing should be considered at a  younger age or be carried out more frequently if you are overweight and have at least 1 risk factor for diabetes.  Colorectal cancer can be detected and often prevented. Most routine colorectal cancer screening begins at the age of 108 and continues through age 71. However, your health care provider may recommend screening at an earlier age if you have risk factors for colon cancer. On a yearly basis, your health care provider may provide home test kits to check for hidden blood in the stool. A small camera at the end of a tube may be used to directly examine the colon (sigmoidoscopy or colonoscopy) to detect the earliest forms of colorectal cancer. Talk to your health care provider about this at age 26 when routine screening begins. A direct exam of the colon should be repeated every 5-10 years through age 55, unless early  forms of precancerous polyps or small growths are found.  People who are at an increased risk for hepatitis B should be screened for this virus. You are considered at high risk for hepatitis B if:  You were born in a country where hepatitis B occurs often. Talk with your health care provider about which countries are considered high risk.  Your parents were born in a high-risk country and you have not received a shot to protect against hepatitis B (hepatitis B vaccine).  You have HIV or AIDS.  You use needles to inject street drugs.  You live with, or have sex with, someone who has hepatitis B.  You are a man who has sex with other men (MSM).  You get hemodialysis treatment.  You take certain medicines for conditions like cancer, organ transplantation, and autoimmune conditions.  Hepatitis C blood testing is recommended for all people born from [birth year] through 1965 and any individual with known risk factors for hepatitis C.  Healthy men should no longer receive prostate-specific antigen (PSA) blood tests as part of routine cancer screening. Talk to your health care provider  about prostate cancer screening.  Testicular cancer screening is not recommended for adolescents or adult males who have no symptoms. Screening includes self-exam, a health care provider exam, and other screening tests. Consult with your health care provider about any symptoms you have or any concerns you have about testicular cancer.  Practice safe sex. Use condoms and avoid high-risk sexual practices to reduce the spread of sexually transmitted infections (STIs).  You should be screened for STIs, including gonorrhea and chlamydia if:  You are sexually active and are younger than 24 years.  You are older than 24 years, and your health care provider tells you that you are at risk for this type of infection.  Your sexual activity has changed since you were last screened, and you are at an increased risk for chlamydia or gonorrhea. Ask your health care provider if you are at risk.  If you are at risk of being infected with HIV, it is recommended that you take a prescription medicine daily to prevent HIV infection. This is called pre-exposure prophylaxis (PrEP). You are considered at risk if:  You are a man who has sex with other men (MSM).  You are a heterosexual man who is sexually active with multiple partners.  You take drugs by injection.  You are sexually active with a partner who has HIV.  Talk with your health care provider about whether you are at high risk of being infected with HIV. If you choose to begin PrEP, you should first be tested for HIV. You should then be tested every 3 months for as long as you are taking PrEP.  Use sunscreen. Apply sunscreen liberally and repeatedly throughout the day. You should seek shade when your shadow is shorter than you. Protect yourself by wearing long sleeves, pants, a wide-brimmed hat, and sunglasses year round whenever you are outdoors.  Tell your health care provider of new moles or changes in moles, especially if there is a change in shape  or color. Also, tell your health care provider if a mole is larger than the size of a pencil eraser.  A one-time screening for abdominal aortic aneurysm (AAA) and surgical repair of large AAAs by ultrasound is recommended for men aged 30-75 years who are current or former smokers.  Stay current with your vaccines (immunizations).   This information is not intended to replace advice given  to you by your health care provider. Make sure you discuss any questions you have with your health care provider.   Document Released: 09/04/2007 Document Revised: 03/29/2014 Document Reviewed: 08/03/2010 Elsevier Interactive Patient Education [employer].

## 2015-04-16 LAB — URINE CULTURE: ORGANISM ID, BACTERIA: NO GROWTH

## 2015-04-16 LAB — SPECIMEN STATUS REPORT

## 2015-04-28 ENCOUNTER — Telehealth: Payer: Self-pay | Admitting: Family Medicine

## 2015-04-28 NOTE — Telephone Encounter (Signed)
error 

## 2015-05-02 ENCOUNTER — Ambulatory Visit (INDEPENDENT_AMBULATORY_CARE_PROVIDER_SITE_OTHER): Payer: PPO

## 2015-05-02 ENCOUNTER — Encounter: Payer: Self-pay | Admitting: Family Medicine

## 2015-05-02 ENCOUNTER — Ambulatory Visit (INDEPENDENT_AMBULATORY_CARE_PROVIDER_SITE_OTHER): Payer: PPO | Admitting: Family Medicine

## 2015-05-02 VITALS — BP 136/82 | HR 77 | Temp 97.6°F | Ht 73.0 in | Wt 201.0 lb

## 2015-05-02 DIAGNOSIS — E559 Vitamin D deficiency, unspecified: Secondary | ICD-10-CM

## 2015-05-02 DIAGNOSIS — E785 Hyperlipidemia, unspecified: Secondary | ICD-10-CM

## 2015-05-02 DIAGNOSIS — N400 Enlarged prostate without lower urinary tract symptoms: Secondary | ICD-10-CM | POA: Diagnosis not present

## 2015-05-02 DIAGNOSIS — M6289 Other specified disorders of muscle: Secondary | ICD-10-CM

## 2015-05-02 DIAGNOSIS — I10 Essential (primary) hypertension: Secondary | ICD-10-CM

## 2015-05-02 DIAGNOSIS — R29898 Other symptoms and signs involving the musculoskeletal system: Secondary | ICD-10-CM

## 2015-05-02 DIAGNOSIS — F209 Schizophrenia, unspecified: Secondary | ICD-10-CM | POA: Diagnosis not present

## 2015-05-02 NOTE — Progress Notes (Signed)
Subjective:    Patient ID: Mark Benjamin, male    DOB: [birth year], 55 y.o.   MRN: [id number]  HPI Pt here for follow up and management of chronic medical problems which includes hypertension and schizophrenia. He is taking medications regularly. The patient is doing well overall. He does complain of some numbness in the left hand which seems to be getting worse and he has an appointment with the orthopedist coming up soon on February 21. The problem he was having with his rectal fissure and the pain he was having is better. He is due to get a chest x-ray today and lab work today. The patient continues to be followed by mental health. He'll be getting a Depakote level today. He has seen the neurologist in the past and the neurologist thinks that the Depakote is playing a role with the left hand weakness. The patient is doing better with his rectal fissure. He denies any chest pain or shortness of breath. He swallowing his food without problems and still has some constipation but no black tarry bowel movements. He has no heartburn or indigestion. He says however he feels like an 55 year old with all the issues are going on currently. He is left-handed and it is interfering with his dressing eating shaving etc. because of the problems with weakness in his left hand.       Patient Active Problem List   Diagnosis Date Noted  . Metabolic syndrome 09/19/1599  . Hypertension 04/02/2013  . Hyperlipidemia 04/02/2013  . BPH (benign prostatic hyperplasia) 04/02/2013  . Left arm weakness 11/21/2012  . Parkinsonism ([place]) 11/21/2012  . Neck pain 11/21/2012  . Colon adenomas 06/29/2012  . Rectal mass 06/29/2012  . Schizophrenia ([place]) 06/22/2012  . Dysphagia 06/22/2011  . Eosinophilic esophagitis 09/32/3557   Outpatient Encounter Prescriptions as of 05/02/2015  Medication Sig  . benztropine (COGENTIN) 1 MG tablet Take 1 tablet by mouth 2 (two) times daily.  . Cholecalciferol (VITAMIN D3) 1000 UNITS  CAPS Take 3 tablets by mouth daily.   Marland Kitchen diltiazem 2 % GEL Apply 1 application topically 2 (two) times daily.  . divalproex (DEPAKOTE) 250 MG DR tablet Take 276m in the am and 500 mg in the pm  . docusate sodium (COLACE) 100 MG capsule Take 100 mg by mouth 2 (two) times daily.  . fosinopril-hydrochlorothiazide (MONOPRIL-HCT) 10-12.5 MG tablet TAKE ONE TABLET BY MOUTH ONCE DAILY  . Omega-3 Fatty Acids (FISH OIL) 1000 MG CPDR Take 1 capsule by mouth daily.  . risperiDONE (RISPERDAL) 2 MG tablet Take 1 tablet by mouth 2 (two) times daily.  . rosuvastatin (CRESTOR) 20 MG tablet TAKE ONE TABLET BY MOUTH ONCE DAILY   No facility-administered encounter medications on file as of 05/02/2015.     Review of Systems  Constitutional: Negative.   HENT: Negative.   Eyes: Negative.   Respiratory: Negative.   Cardiovascular: Negative.   Gastrointestinal: Negative.   Endocrine: Negative.   Genitourinary: Negative.   Musculoskeletal: Positive for arthralgias (left hand and wrist pain - appt with Dr YRosendo Grossoon).  Skin: Negative.   Allergic/Immunologic: Negative.   Neurological: Negative.   Hematological: Negative.   Psychiatric/Behavioral: Negative.        Objective:   Physical Exam  Constitutional: He is oriented to person, place, and time. He appears well-developed and well-nourished. No distress.  HENT:  Head: Normocephalic and atraumatic.  Right Ear: External ear normal.  Left Ear: External ear normal.  Nose: Nose normal.  Mouth/Throat: Oropharynx is  clear and moist. No oropharyngeal exudate.  Eyes: Conjunctivae and EOM are normal. Pupils are equal, round, and reactive to light. Right eye exhibits no discharge. Left eye exhibits no discharge. No scleral icterus.  Neck: Normal range of motion. Neck supple. No thyromegaly present.  Cardiovascular: Normal rate, regular rhythm and normal heart sounds.   No murmur heard. Pulmonary/Chest: Effort normal and breath sounds normal. No respiratory  distress. He has no wheezes. He has no rales. He exhibits no tenderness.  Clear anteriorly and posteriorly  Abdominal: Soft. Bowel sounds are normal. He exhibits no mass. There is no tenderness. There is no rebound and no guarding.  No abdominal tenderness liver or spleen enlargement or inguinal adenopathy or masses  Musculoskeletal: He exhibits no edema or tenderness.  The patient is slow with movement. He has trouble using his left hand is because of the weakness. He has good pulses in both hands. He has good grip in both hands.  Lymphadenopathy:    He has no cervical adenopathy.  Neurological: He is alert and oriented to person, place, and time. He has normal reflexes. No cranial nerve deficit.  Skin: Skin is warm and dry. No rash noted.  Psychiatric: He has a normal mood and affect. His behavior is normal. Judgment and thought content normal.  The mood and affect are somewhat flat. The patient is alert and responds to every question asked of him appropriately.  Nursing note and vitals reviewed.  BP 136/82 mmHg  Pulse 77  Temp(Src) 97.6 F (36.4 C) (Oral)  Ht _0  (1.854 m)  Wt 201 lb (91.173 kg)  BMI 26.52 kg/m2        Assessment & Plan:  1. Hyperlipidemia -Continue current treatment pending results of lab work - CBC with Differential/Platelet - Lipid panel  2. Essential hypertension -Blood pressure is good today and he should continue with current treatment - BMP8+EGFR - CBC with Differential/Platelet - Hepatic function panel - Lipid panel - DG Chest 2 View; Future  3. Schizophrenia, unspecified type ([place]) -Continue follow-up with mental health - CBC with Differential/Platelet - Valproic acid level  4. Vitamin D deficiency -Continue current treatment pending results of lab work - CBC with Differential/Platelet - VITAMIN D 25 Hydroxy (Vit-D Deficiency, Fractures)  5. BPH (benign prostatic hyperplasia) -No complaints with passing his water. He still has the  rectal fissure problems which seems to be getting better. - CBC with Differential/Platelet  6. Left hand weakness -Follow-up with orthopedist as planned. -Patient needs to arrange transportation to this visit.  Patient Instructions  Continue current medications. Continue good therapeutic lifestyle changes which include good diet and exercise. Fall precautions discussed with patient. If an FOBT was given today- please return it to our front desk. If you are over 62 years old - you may need Prevnar 35 or the adult Pneumonia vaccine.  **Flu shots are available--- please call and schedule a FLU-CLINIC appointment**  After your visit with [place] today you will receive a survey in the mail or online from [employer] regarding your care with [place]. Please take a moment to fill this out. Your feedback is very important to [place] as you can help [place] better understand your patient needs as well as improve your experience and satisfaction. WE CARE ABOUT YOU!!!   The patient should continue to follow-up with mental health and Dr. Hoyle Barr He should follow-up with the orthopedic specialist as planned and make arrangements for transportation to get there.    Arrie Senate MD

## 2015-05-02 NOTE — Patient Instructions (Addendum)
Continue current medications. Continue good therapeutic lifestyle changes which include good diet and exercise. Fall precautions discussed with patient. If an FOBT was given today- please return it to our front desk. If you are over 55 years old - you may need Prevnar 66 or the adult Pneumonia vaccine.  **Flu shots are available--- please call and schedule a FLU-CLINIC appointment**  After your visit with [place] today you will receive a survey in the mail or online from [employer] regarding your care with [place]. Please take a moment to fill this out. Your feedback is very important to [place] as you can help [place] better understand your patient needs as well as improve your experience and satisfaction. WE CARE ABOUT YOU!!!   The patient should continue to follow-up with mental health and Dr. Hoyle Barr He should follow-up with the orthopedic specialist as planned and make arrangements for transportation to get there.

## 2015-05-03 LAB — CBC WITH DIFFERENTIAL/PLATELET
BASOS ABS: 0 10*3/uL (ref 0.0–0.2)
BASOS: 1 %
EOS (ABSOLUTE): 0 10*3/uL (ref 0.0–0.4)
EOS: 1 %
HEMATOCRIT: 41.5 % (ref 37.5–51.0)
Hemoglobin: 14.3 g/dL (ref 12.6–17.7)
Immature Grans (Abs): 0 10*3/uL (ref 0.0–0.1)
Immature Granulocytes: 0 %
LYMPHS ABS: 1.3 10*3/uL (ref 0.7–3.1)
Lymphs: 21 %
MCH: 31.6 pg (ref 26.6–33.0)
MCHC: 34.5 g/dL (ref 31.5–35.7)
MCV: 92 fL (ref 79–97)
MONOS ABS: 0.7 10*3/uL (ref 0.1–0.9)
Monocytes: 12 %
NEUTROS ABS: 3.9 10*3/uL (ref 1.4–7.0)
NEUTROS PCT: 65 %
Platelets: 220 10*3/uL (ref 150–379)
RBC: 4.53 x10E6/uL (ref 4.14–5.80)
RDW: 13.9 % (ref 12.3–15.4)
WBC: 6 10*3/uL (ref 3.4–10.8)

## 2015-05-03 LAB — BMP8+EGFR
BUN / CREAT RATIO: 7 — AB (ref 9–20)
BUN: 5 mg/dL — ABNORMAL LOW (ref 6–24)
CO2: 25 mmol/L (ref 18–29)
CREATININE: 0.69 mg/dL — AB (ref 0.76–1.27)
Calcium: 9.6 mg/dL (ref 8.7–10.2)
Chloride: 93 mmol/L — ABNORMAL LOW (ref 96–106)
GFR, EST AFRICAN AMERICAN: 124 mL/min/{1.73_m2} (ref >59)
GFR, EST NON AFRICAN AMERICAN: 108 mL/min/{1.73_m2} (ref >59)
Glucose: 95 mg/dL (ref 65–99)
POTASSIUM: 4 mmol/L (ref 3.5–5.2)
SODIUM: 137 mmol/L (ref 134–144)

## 2015-05-03 LAB — HEPATIC FUNCTION PANEL
ALBUMIN: 4.8 g/dL (ref 3.5–5.5)
ALT: 22 IU/L (ref 0–44)
AST: 22 IU/L (ref 0–40)
Alkaline Phosphatase: 62 IU/L (ref 39–117)
Bilirubin Total: 1.5 mg/dL — ABNORMAL HIGH (ref 0.0–1.2)
Bilirubin, Direct: 0.37 mg/dL (ref 0.00–0.40)
Total Protein: 6.6 g/dL (ref 6.0–8.5)

## 2015-05-03 LAB — LIPID PANEL
CHOL/HDL RATIO: 2.2 ratio (ref 0.0–5.0)
CHOLESTEROL TOTAL: 130 mg/dL (ref 100–199)
HDL: 58 mg/dL (ref >39)
LDL Calculated: 60 mg/dL (ref 0–99)
TRIGLYCERIDES: 58 mg/dL (ref 0–149)
VLDL Cholesterol Cal: 12 mg/dL (ref 5–40)

## 2015-05-03 LAB — VALPROIC ACID LEVEL: Valproic Acid Lvl: 59 ug/mL (ref 50–100)

## 2015-05-03 LAB — VITAMIN D 25 HYDROXY (VIT D DEFICIENCY, FRACTURES): Vit D, 25-Hydroxy: 43.5 ng/mL (ref 30.0–100.0)

## 2015-05-13 DIAGNOSIS — M542 Cervicalgia: Secondary | ICD-10-CM | POA: Diagnosis not present

## 2015-05-13 DIAGNOSIS — M79602 Pain in left arm: Secondary | ICD-10-CM | POA: Diagnosis not present

## 2015-05-20 ENCOUNTER — Other Ambulatory Visit: Payer: Self-pay | Admitting: Family Medicine

## 2015-05-20 ENCOUNTER — Other Ambulatory Visit: Payer: Self-pay | Admitting: Orthopaedic Surgery

## 2015-05-20 DIAGNOSIS — F29 Unspecified psychosis not due to a substance or known physiological condition: Secondary | ICD-10-CM | POA: Diagnosis not present

## 2015-05-20 DIAGNOSIS — M542 Cervicalgia: Secondary | ICD-10-CM

## 2015-06-05 ENCOUNTER — Ambulatory Visit
Admission: RE | Admit: 2015-06-05 | Discharge: 2015-06-05 | Disposition: A | Discharge status: Home / Self Care | Payer: PPO | Source: Ambulatory Visit | Attending: Orthopaedic Surgery | Admitting: Orthopaedic Surgery

## 2015-06-05 DIAGNOSIS — M542 Cervicalgia: Secondary | ICD-10-CM

## 2015-06-05 DIAGNOSIS — M4802 Spinal stenosis, cervical region: Secondary | ICD-10-CM | POA: Diagnosis not present

## 2015-06-24 DIAGNOSIS — M542 Cervicalgia: Secondary | ICD-10-CM | POA: Diagnosis not present

## 2015-07-03 ENCOUNTER — Other Ambulatory Visit: Payer: Self-pay | Admitting: Family Medicine

## 2015-07-18 IMAGING — CR DG CHEST 2V
2 series · 2 of 2 positions shown · non-contrast
Comparison: None.

CLINICAL DATA: Hypertension

EXAM:
CHEST  2 VIEW

[view not recorded (1 of 2)]
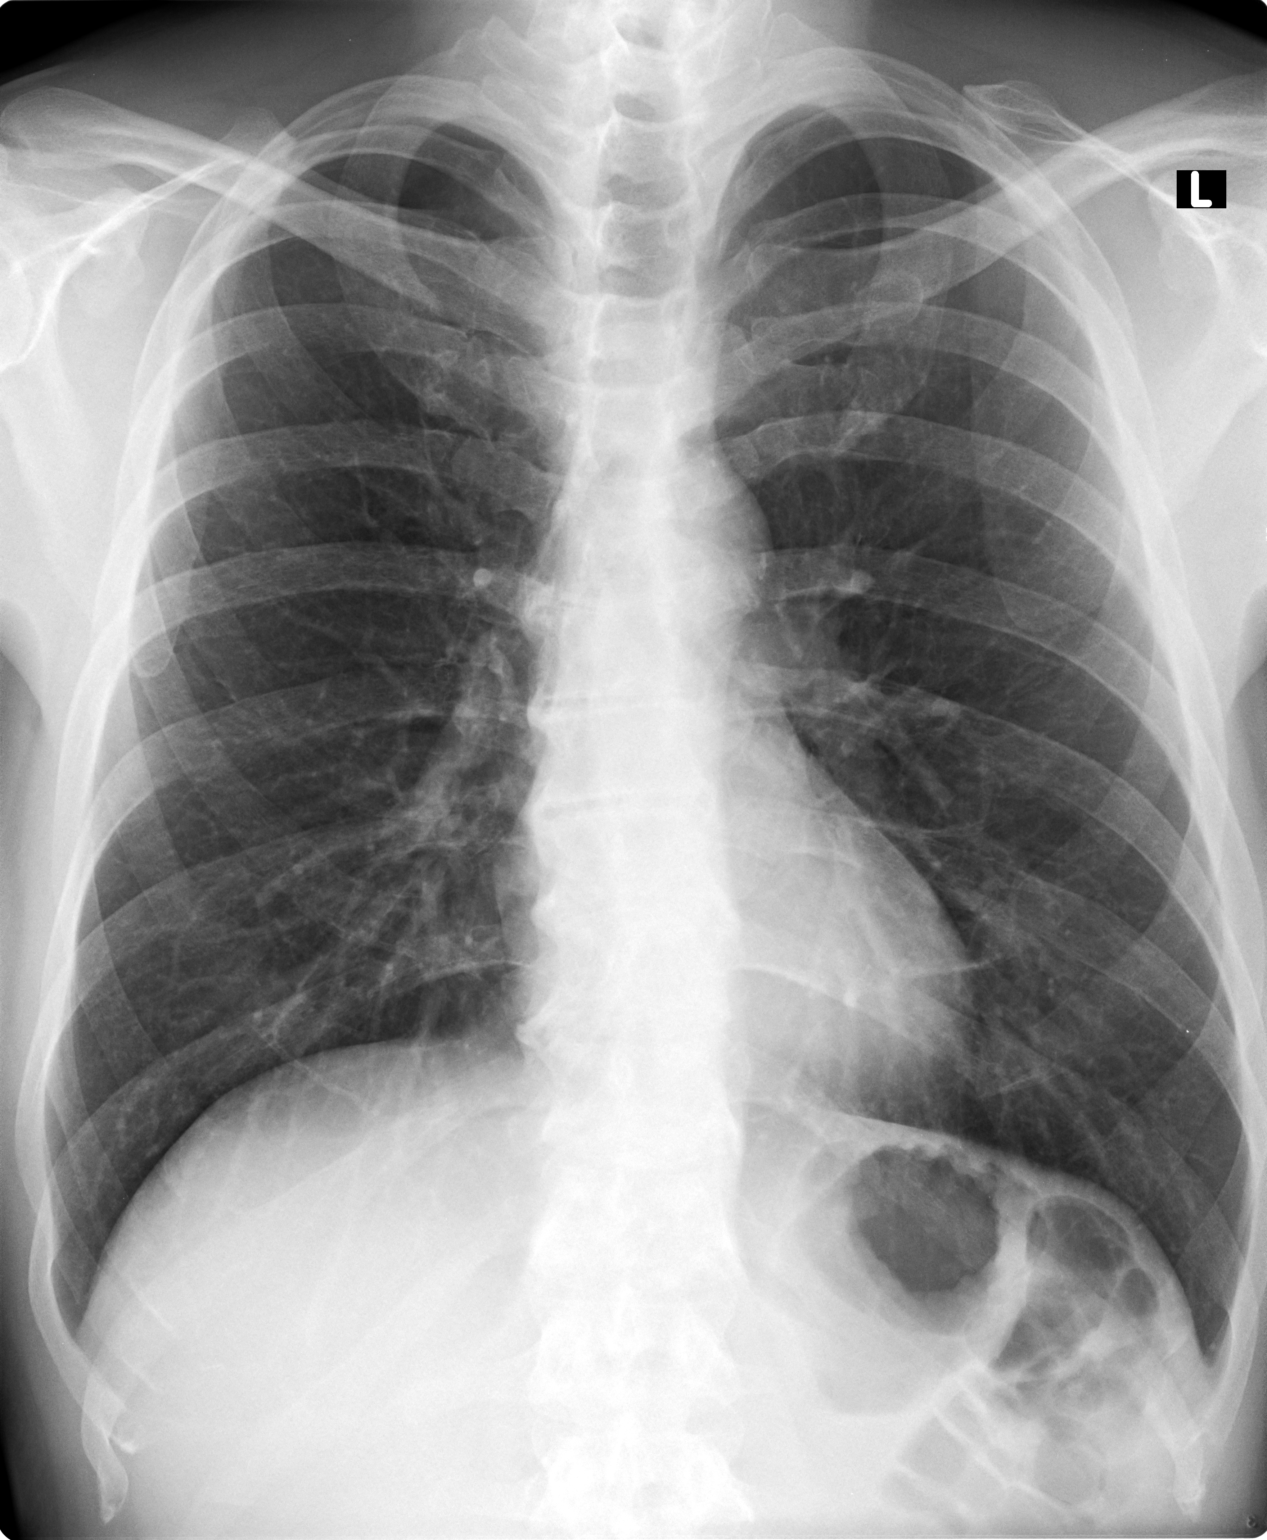

[view not recorded (2 of 2)]
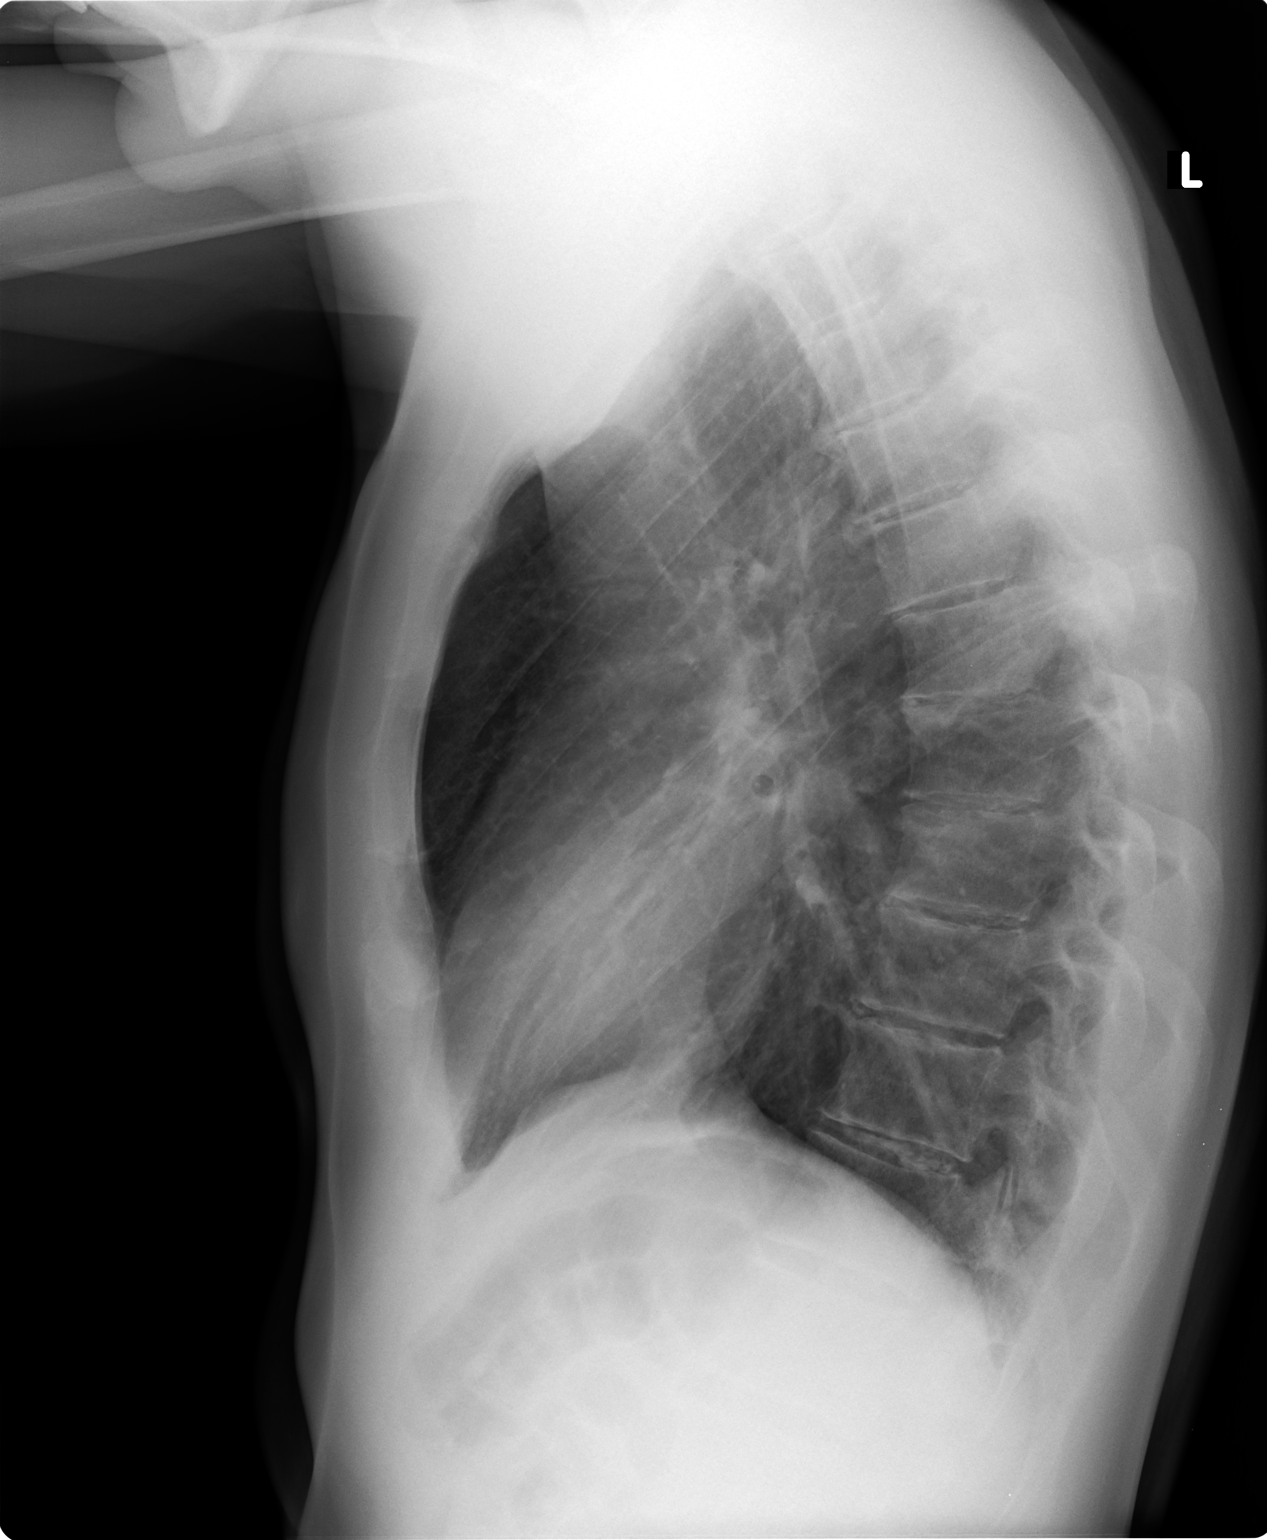

[2 of 2 positions shown; findings below may reference images not displayed]

FINDINGS: The lungs are clear and negative for focal airspace consolidation,
pulmonary edema or suspicious pulmonary nodule. No pleural effusion
or pneumothorax. Trace atherosclerotic calcifications noted in the
transverse aorta. Mild multilevel degenerative change in the
visualized thoracic spine. Cardiac and mediastinal contours are
within normal limits. No acute fracture or lytic or blastic osseous
lesions. The visualized upper abdominal bowel gas pattern is
unremarkable.
IMPRESSION: No active cardiopulmonary disease.

## 2015-08-13 ENCOUNTER — Encounter: Payer: Self-pay | Admitting: Family Medicine

## 2015-08-13 ENCOUNTER — Ambulatory Visit (INDEPENDENT_AMBULATORY_CARE_PROVIDER_SITE_OTHER): Payer: PPO | Admitting: Family Medicine

## 2015-08-13 VITALS — BP 154/87 | HR 80 | Temp 97.3°F | Ht 73.0 in | Wt 206.6 lb

## 2015-08-13 DIAGNOSIS — F209 Schizophrenia, unspecified: Secondary | ICD-10-CM | POA: Diagnosis not present

## 2015-08-13 DIAGNOSIS — R5383 Other fatigue: Secondary | ICD-10-CM | POA: Diagnosis not present

## 2015-08-13 NOTE — Progress Notes (Signed)
   HPI  Patient presents today here with fatigue.  Patient explains that over the last month or so he's had worsening generalized fatigue. He feels like his hand movements aren't as fast as usual and that everything is going in slow motion.  The hand movements and slow motion feelings been going on for about 2 months.  He mentions stopping Actos and being concerned that his diabetes is worse. He would like his blood sugar checked today.  He is also concerned that it's his medications. His psychiatrist, Dr. Marliss Czar in [place], has been decreasing his Depakote dose recently   PMH: Smoking status noted ROS: Per HPI  Objective: BP 154/87 mmHg  Pulse 80  Temp(Src) 97.3 F (36.3 C) (Oral)  Ht '6\' 1"'$  (1.854 m)  Wt 206 lb 9.6 oz (93.713 kg)  BMI 27.26 kg/m2 Gen: NAD, alert, cooperative with exam HEENT: NCAT CV: RRR, good S1/S2, no murmur Resp: CTABL, no wheezes, non-labored Ext: No edema, warm Neuro: Alert and oriented, slow movements, no obvious tremor at rest  Psych: odd affect, slow movements, mood appropriate  Depression screen [facility] 2/9 08/13/2015 05/02/2015 04/14/2015  Decreased Interest 3 2 0  Down, Depressed, Hopeless '2 1 1  '$ PHQ - 2 Score '5 3 1  '$ Altered sleeping 0 1 -  Tired, decreased energy 3 1 -  Change in appetite 0 0 -  Feeling bad or failure about yourself  2 1 -  Trouble concentrating 2 1 -  Moving slowly or fidgety/restless 1 1 -  Suicidal thoughts - 0 -  PHQ-9 Score 13 8 -  Difficult doing work/chores - Somewhat difficult -     Assessment and plan:  # Fatigue Unclear etiology, however I suspect primarily schizophrenia versus medication effect. Labs drawn to rule out thyroid disorder, anemia, etc. 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 He is not fasting today, he has  sugar sweetened beverages this morning  Also, after chart review, it appears that he has been diagnosed with parkinsonism in 2014 by neurology, considering his antipsychotic (risperdal, anti dopaminergic medication) this may be drug induced and would be very challenging to treat given that L dopa would exacerbate Schizophrenia symps.      Orders Placed This Encounter  Procedures  . TSH  . T4, Free  . CMP14+EGFR  . CBC with Differential  . Valproic acid level    Laroy Apple, MD [facility] 08/13/2015, 9:56 AM

## 2015-08-13 NOTE — Patient Instructions (Addendum)
Great to meet you!  Your fatigue could have lots of sources, the most likely one is your mood or your medications.   I am doing labs to evaluate you for other reasons.   I would recommend regular aerobic exercise, which may make you feel better.

## 2015-08-14 LAB — CBC WITH DIFFERENTIAL/PLATELET
BASOS: 1 %
Basophils Absolute: 0 10*3/uL (ref 0.0–0.2)
EOS (ABSOLUTE): 0.2 10*3/uL (ref 0.0–0.4)
Eos: 4 %
Hematocrit: 41.7 % (ref 37.5–51.0)
Hemoglobin: 14.7 g/dL (ref 12.6–17.7)
IMMATURE GRANS (ABS): 0 10*3/uL (ref 0.0–0.1)
IMMATURE GRANULOCYTES: 0 %
LYMPHS: 25 %
Lymphocytes Absolute: 1.1 10*3/uL (ref 0.7–3.1)
MCH: 31.5 pg (ref 26.6–33.0)
MCHC: 35.3 g/dL (ref 31.5–35.7)
MCV: 90 fL (ref 79–97)
Monocytes Absolute: 0.6 10*3/uL (ref 0.1–0.9)
Monocytes: 14 %
NEUTROS PCT: 56 %
Neutrophils Absolute: 2.5 10*3/uL (ref 1.4–7.0)
PLATELETS: 244 10*3/uL (ref 150–379)
RBC: 4.66 x10E6/uL (ref 4.14–5.80)
RDW: 12.7 % (ref 12.3–15.4)
WBC: 4.3 10*3/uL (ref 3.4–10.8)

## 2015-08-14 LAB — CMP14+EGFR
A/G RATIO: 2.2 (ref 1.2–2.2)
ALBUMIN: 4.6 g/dL (ref 3.5–5.5)
ALT: 20 IU/L (ref 0–44)
AST: 18 IU/L (ref 0–40)
Alkaline Phosphatase: 63 IU/L (ref 39–117)
BUN/Creatinine Ratio: 6 — ABNORMAL LOW (ref 9–20)
BUN: 5 mg/dL — ABNORMAL LOW (ref 6–24)
Bilirubin Total: 1.4 mg/dL — ABNORMAL HIGH (ref 0.0–1.2)
CALCIUM: 9.6 mg/dL (ref 8.7–10.2)
CO2: 26 mmol/L (ref 18–29)
CREATININE: 0.83 mg/dL (ref 0.76–1.27)
Chloride: 96 mmol/L (ref 96–106)
GFR calc Af Amer: 115 mL/min/{1.73_m2} (ref >59)
GFR, EST NON AFRICAN AMERICAN: 99 mL/min/{1.73_m2} (ref >59)
GLOBULIN, TOTAL: 2.1 g/dL (ref 1.5–4.5)
Glucose: 97 mg/dL (ref 65–99)
POTASSIUM: 4.2 mmol/L (ref 3.5–5.2)
Sodium: 137 mmol/L (ref 134–144)
TOTAL PROTEIN: 6.7 g/dL (ref 6.0–8.5)

## 2015-08-14 LAB — T4, FREE: Free T4: 1.28 ng/dL (ref 0.82–1.77)

## 2015-08-14 LAB — TSH: TSH: 1.59 u[IU]/mL (ref 0.450–4.500)

## 2015-08-14 LAB — VALPROIC ACID LEVEL: VALPROIC ACID LVL: 37 ug/mL — AB (ref 50–100)

## 2015-09-05 ENCOUNTER — Encounter: Payer: Self-pay | Admitting: Family Medicine

## 2015-09-05 ENCOUNTER — Ambulatory Visit (INDEPENDENT_AMBULATORY_CARE_PROVIDER_SITE_OTHER): Payer: PPO | Admitting: Family Medicine

## 2015-09-05 VITALS — BP 129/73 | HR 78 | Temp 97.8°F | Ht 73.0 in | Wt 205.0 lb

## 2015-09-05 DIAGNOSIS — E559 Vitamin D deficiency, unspecified: Secondary | ICD-10-CM | POA: Diagnosis not present

## 2015-09-05 DIAGNOSIS — M4802 Spinal stenosis, cervical region: Secondary | ICD-10-CM

## 2015-09-05 DIAGNOSIS — N400 Enlarged prostate without lower urinary tract symptoms: Secondary | ICD-10-CM | POA: Diagnosis not present

## 2015-09-05 DIAGNOSIS — G20 Parkinson's disease: Secondary | ICD-10-CM | POA: Diagnosis not present

## 2015-09-05 DIAGNOSIS — G20A1 Parkinson's disease without dyskinesia, without mention of fluctuations: Secondary | ICD-10-CM | POA: Insufficient documentation

## 2015-09-05 DIAGNOSIS — I10 Essential (primary) hypertension: Secondary | ICD-10-CM

## 2015-09-05 DIAGNOSIS — R350 Frequency of micturition: Secondary | ICD-10-CM | POA: Diagnosis not present

## 2015-09-05 DIAGNOSIS — M79602 Pain in left arm: Secondary | ICD-10-CM

## 2015-09-05 DIAGNOSIS — F209 Schizophrenia, unspecified: Secondary | ICD-10-CM

## 2015-09-05 DIAGNOSIS — E785 Hyperlipidemia, unspecified: Secondary | ICD-10-CM

## 2015-09-05 LAB — URINALYSIS, COMPLETE
Bilirubin, UA: NEGATIVE
GLUCOSE, UA: NEGATIVE
KETONES UA: NEGATIVE
LEUKOCYTES UA: NEGATIVE
Nitrite, UA: NEGATIVE
Protein, UA: NEGATIVE
SPEC GRAV UA: 1.01 (ref 1.005–1.030)
Urobilinogen, Ur: 0.2 mg/dL (ref 0.2–1.0)
pH, UA: 7 (ref 5.0–7.5)

## 2015-09-05 LAB — MICROSCOPIC EXAMINATION
Bacteria, UA: NONE SEEN
EPITHELIAL CELLS (NON RENAL): NONE SEEN /HPF (ref 0–10)

## 2015-09-05 NOTE — Patient Instructions (Signed)
Medicare Annual Wellness Visit  [place] and the medical providers at [facility] strive to bring you the best medical care.  In doing so we not only want to address your current medical conditions and concerns but also to detect new conditions early and prevent illness, disease and health-related problems.    Medicare offers a yearly Wellness Visit which allows our clinical staff to assess your need for preventative services including immunizations, lifestyle education, counseling to decrease risk of preventable diseases and screening for fall risk and other medical concerns.    This visit is provided free of charge (no copay) for all Medicare recipients. The clinical pharmacists at [facility] have begun to conduct these Wellness Visits which will also include a thorough review of all your medications.    As you primary medical provider recommend that you make an appointment for your Annual Wellness Visit if you have not done so already this year.  You may set up this appointment before you leave today or you may call back ([phone]) and schedule an appointment.  Please make sure when you call that you mention that you are scheduling your Annual Wellness Visit with the clinical pharmacist so that the appointment may be made for the proper length of time.     Continue current medications. Continue good therapeutic lifestyle changes which include good diet and exercise. Fall precautions discussed with patient. If an FOBT was given today- please return it to our front desk. If you are over 50 years old - you may need Prevnar 13 or the adult Pneumonia vaccine.  **Flu shots are available--- please call and schedule a FLU-CLINIC appointment**  After your visit with us today you will receive a survey in the mail or online from [employer] regarding your care with us. Please take a moment to fill this out. Your feedback is very  important to us as you can help us better understand your patient needs as well as improve your experience and satisfaction. WE CARE ABOUT YOU!!!    

## 2015-09-05 NOTE — Progress Notes (Signed)
Subjective:    Patient ID: Mark Benjamin, male    DOB: 11/25/1960, 55 y.o.   MRN: [id number]  HPI Pt here for follow up and management of chronic medical problems which includes hypertension and hyperlipidemia. He is taking medications regularly.The patient's main complaint today is generalized weakness and urinary frequency. He will get lab work today and we will make sure that her B12 level and a thyroid profile are done. We will also ask that we check a urine specimen. The patient brings in a copy of an MRI report of the cervical spine. This was done on March 16 of this year. There is a mild broad-based disc bulge at C3 and 4 with resulting severe left foraminal stenosis. There is mild right foraminal stenosis and mild spinal stenosis. Cervical spine spondylosis is there also. The patient is continued to be followed by mental health and Dr. lay. He is not seeing the neurologist in a good while and nor has he seen the neurosurgeon in a good while. The patient denies chest pain shortness of breath trouble swallowing heartburn indigestion nausea vomiting diarrhea or blood in the stool. He does have constipation secondary to medications and does take a stool softener for this. He indicates that he has a lot of left arm pain especially with getting up in the morning and at other times and that this will be relieved by laying down. He also has a lot of stiffness that is so bad that he is not unable to pick up things on the floor when they're dropped. He does not get a lot of exercise. As a result of reviewing this report from his orthopedic surgeon and getting his history today we have discussed with him the possibility of going back to seeing the neurologist and the neurosurgeon.     Patient Active Problem List   Diagnosis Date Noted  . Metabolic syndrome 74/14/2395  . Hypertension 04/02/2013  . Hyperlipidemia 04/02/2013  . BPH (benign prostatic hyperplasia) 04/02/2013  . Left arm weakness 11/21/2012   . Parkinsonism ([place]) 11/21/2012  . Neck pain 11/21/2012  . Colon adenomas 06/29/2012  . Rectal mass 06/29/2012  . Schizophrenia ([place]) 06/22/2012  . Dysphagia 06/22/2011  . Eosinophilic esophagitis 32/04/3341   Outpatient Encounter Prescriptions as of 09/05/2015  Medication Sig  . benztropine (COGENTIN) 1 MG tablet Take 1 tablet by mouth 2 (two) times daily.  . Cholecalciferol (VITAMIN D3) 1000 UNITS CAPS Take 3 tablets by mouth daily.   Marland Kitchen diltiazem 2 % GEL Apply 1 application topically 2 (two) times daily.  . divalproex (DEPAKOTE) 250 MG DR tablet Take 232m in the am and 500 mg in the pm  . docusate sodium (COLACE) 100 MG capsule Take 100 mg by mouth 2 (two) times daily.  . fosinopril-hydrochlorothiazide (MONOPRIL-HCT) 10-12.5 MG tablet TAKE ONE TABLET BY MOUTH ONCE DAILY  . Omega-3 Fatty Acids (FISH OIL) 1000 MG CPDR Take 1 capsule by mouth daily.  . risperiDONE (RISPERDAL) 2 MG tablet Take 1 tablet by mouth 2 (two) times daily.  . rosuvastatin (CRESTOR) 20 MG tablet TAKE ONE TABLET BY MOUTH ONCE DAILY   No facility-administered encounter medications on file as of 09/05/2015.     Review of Systems  HENT: Negative.   Eyes: Negative.   Respiratory: Negative.   Cardiovascular: Negative.   Gastrointestinal: Negative.   Endocrine: Negative.   Genitourinary: Negative.   Musculoskeletal: Negative.   Skin: Negative.   Allergic/Immunologic: Negative.   Neurological: Positive for weakness.  Hematological:  Negative.   Psychiatric/Behavioral: Negative.        Objective:   Physical Exam  Constitutional: He is oriented to person, place, and time. He appears well-developed and well-nourished. No distress.  The patient is alert but has rigidity of movement.  HENT:  Head: Normocephalic and atraumatic.  Right Ear: External ear normal.  Left Ear: External ear normal.  Nose: Nose normal.  Mouth/Throat: Oropharynx is clear and moist. No oropharyngeal exudate.  Eyes: Conjunctivae and  EOM are normal. Pupils are equal, round, and reactive to light. Right eye exhibits no discharge. Left eye exhibits no discharge. No scleral icterus.  Neck: Normal range of motion. Neck supple. No thyromegaly present.  No bruits or thyromegaly or anterior cervical adenopathy  Cardiovascular: Normal rate, regular rhythm and normal heart sounds.   No murmur heard. Heart has a regular rate and rhythm at 72/m  Pulmonary/Chest: Effort normal and breath sounds normal. No respiratory distress. He has no wheezes. He has no rales. He exhibits no tenderness.  Lungs are clear anteriorly and posteriorly no axillary adenopathy  Abdominal: Soft. Bowel sounds are normal. He exhibits no mass. There is no tenderness. There is no rebound and no guarding.  No abdominal tenderness masses or organ enlargement or bruits  Musculoskeletal: Normal range of motion. He exhibits no edema or tenderness.  The patient has rigid movements in general with all extremities  Lymphadenopathy:    He has no cervical adenopathy.  Neurological: He is alert and oriented to person, place, and time. He has normal reflexes. No cranial nerve deficit.  Rigidity of movement.  Skin: Skin is warm and dry. No rash noted.  Psychiatric: He has a normal mood and affect. His behavior is normal. Judgment and thought content normal.  The patient is calm and alert.  Nursing note and vitals reviewed.  BP 129/73 mmHg  Pulse 78  Temp(Src) 97.8 F (36.6 C) (Oral)  Ht '6\' 1"'  (1.854 m)  Wt 205 lb (92.987 kg)  BMI 27.05 kg/m2        Assessment & Plan:  1. Hyperlipidemia -Continue aggressive therapeutic lifestyle changes and current treatment pending results of lab work - NMR, lipoprofile - CBC with Differential/Platelet  2. Vitamin D deficiency -Continue current treatment pending results of lab work - VITAMIN D 25 Hydroxy (Vit-D Deficiency, Fractures) - CBC with Differential/Platelet  3. Essential hypertension -The blood pressure is good  today and he should continue with current treatment and sodium restriction - CBC with Differential/Platelet - Hepatic function panel - BMP8+EGFR  4. Schizophrenia, unspecified type ([facility]) -Continue current medications and follow-up with mental health - Valproic acid level - CBC with Differential/Platelet  5. BPH (benign prostatic hyperplasia) -Check urinalysis today - CBC with Differential/Platelet  6. Urinary frequency -Check urinalysis because of frequency - Urinalysis, Complete - Urine culture  7. Parkinson disease ([place]) - Ambulatory referral to Neurology  8. Left arm pain - Ambulatory referral to Neurosurgery  9. Spinal stenosis in cervical region - Ambulatory referral to Neurosurgery  Patient Instructions                       Medicare Annual Wellness Visit  [place] and the medical providers at [facility] strive to bring you the best medical care.  In doing so we not only want to address your current medical conditions and concerns but also to detect new conditions early and prevent illness, disease and health-related problems.    Medicare offers a yearly  Wellness Visit which allows our clinical staff to assess your need for preventative services including immunizations, lifestyle education, counseling to decrease risk of preventable diseases and screening for fall risk and other medical concerns.    This visit is provided free of charge (no copay) for all Medicare recipients. The clinical pharmacists at [facility] have begun to conduct these Wellness Visits which will also include a thorough review of all your medications.    As you primary medical provider recommend that you make an appointment for your Annual Wellness Visit if you have not done so already this year.  You may set up this appointment before you leave today or you may call back ([phone]) and schedule an appointment.  Please make sure when you call that you  mention that you are scheduling your Annual Wellness Visit with the clinical pharmacist so that the appointment may be made for the proper length of time.     Continue current medications. Continue good therapeutic lifestyle changes which include good diet and exercise. Fall precautions discussed with patient. If an FOBT was given today- please return it to our front desk. If you are over 65 years old - you may need Prevnar 55 or the adult Pneumonia vaccine.  **Flu shots are available--- please call and schedule a FLU-CLINIC appointment**  After your visit with [place] today you will receive a survey in the mail or online from [employer] regarding your care with [place]. Please take a moment to fill this out. Your feedback is very important to [place] as you can help [place] better understand your patient needs as well as improve your experience and satisfaction. WE CARE ABOUT YOU!!!      Arrie Senate MD

## 2015-09-06 LAB — CBC WITH DIFFERENTIAL/PLATELET
BASOS: 1 %
Basophils Absolute: 0 10*3/uL (ref 0.0–0.2)
EOS (ABSOLUTE): 0.1 10*3/uL (ref 0.0–0.4)
Eos: 3 %
HEMOGLOBIN: 14.4 g/dL (ref 12.6–17.7)
Hematocrit: 42.2 % (ref 37.5–51.0)
IMMATURE GRANS (ABS): 0 10*3/uL (ref 0.0–0.1)
Immature Granulocytes: 0 %
LYMPHS ABS: 1.4 10*3/uL (ref 0.7–3.1)
LYMPHS: 30 %
MCH: 30.7 pg (ref 26.6–33.0)
MCHC: 34.1 g/dL (ref 31.5–35.7)
MCV: 90 fL (ref 79–97)
MONOCYTES: 8 %
Monocytes Absolute: 0.4 10*3/uL (ref 0.1–0.9)
NEUTROS ABS: 2.8 10*3/uL (ref 1.4–7.0)
Neutrophils: 58 %
Platelets: 218 10*3/uL (ref 150–379)
RBC: 4.69 x10E6/uL (ref 4.14–5.80)
RDW: 13 % (ref 12.3–15.4)
WBC: 4.7 10*3/uL (ref 3.4–10.8)

## 2015-09-06 LAB — BMP8+EGFR
BUN / CREAT RATIO: 7 — AB (ref 9–20)
BUN: 6 mg/dL (ref 6–24)
CHLORIDE: 95 mmol/L — AB (ref 96–106)
CO2: 24 mmol/L (ref 18–29)
Calcium: 9.1 mg/dL (ref 8.7–10.2)
Creatinine, Ser: 0.83 mg/dL (ref 0.76–1.27)
GFR calc Af Amer: 115 mL/min/{1.73_m2} (ref >59)
GFR calc non Af Amer: 99 mL/min/{1.73_m2} (ref >59)
GLUCOSE: 91 mg/dL (ref 65–99)
POTASSIUM: 4 mmol/L (ref 3.5–5.2)
SODIUM: 135 mmol/L (ref 134–144)

## 2015-09-06 LAB — URINE CULTURE: Organism ID, Bacteria: NO GROWTH

## 2015-09-06 LAB — NMR, LIPOPROFILE
CHOLESTEROL: 111 mg/dL (ref 100–199)
HDL Cholesterol by NMR: 46 mg/dL (ref >39)
HDL PARTICLE NUMBER: 32.8 umol/L (ref >=30.5)
LDL Particle Number: 592 nmol/L (ref <1000)
LDL Size: 20.5 nm (ref >20.5)
LDL-C: 52 mg/dL (ref 0–99)
LP-IR SCORE: 46 — AB (ref <=45)
SMALL LDL PARTICLE NUMBER: 383 nmol/L (ref <=527)
Triglycerides by NMR: 64 mg/dL (ref 0–149)

## 2015-09-06 LAB — VALPROIC ACID LEVEL: Valproic Acid Lvl: 38 ug/mL — ABNORMAL LOW (ref 50–100)

## 2015-09-06 LAB — VITAMIN D 25 HYDROXY (VIT D DEFICIENCY, FRACTURES): Vit D, 25-Hydroxy: 47.6 ng/mL (ref 30.0–100.0)

## 2015-09-06 LAB — HEPATIC FUNCTION PANEL
ALK PHOS: 60 IU/L (ref 39–117)
ALT: 18 IU/L (ref 0–44)
AST: 19 IU/L (ref 0–40)
Albumin: 4.7 g/dL (ref 3.5–5.5)
BILIRUBIN, DIRECT: 0.39 mg/dL (ref 0.00–0.40)
Bilirubin Total: 1.8 mg/dL — ABNORMAL HIGH (ref 0.0–1.2)
Total Protein: 6.6 g/dL (ref 6.0–8.5)

## 2015-09-08 ENCOUNTER — Telehealth: Payer: Self-pay | Admitting: Family Medicine

## 2015-09-08 NOTE — Telephone Encounter (Signed)
They would like to wait on the referral to neurosurgery and neurology until after he see's psychiatry. When they have seen these Dr's in the past they believed that the tremors may be happening from the psychiatric medication.

## 2015-09-08 NOTE — Telephone Encounter (Signed)
You may want to go on and get the appointments as these appointments will be scheduled after the visit with the psychiatrist anyway. If the psychiatrist's thinks that he can resolve the issues with medication changes then we can cancel the appointments. He does have structural issues going on in his neck. Please get back in touch with [place] after he sees the psychiatrist and we can move forward with the appointments with neurology and neurosurgery if the appointments have not already been made.

## 2015-09-08 NOTE — Telephone Encounter (Signed)
Patients sister will contact us once he see's psychiatry.

## 2015-09-08 NOTE — Telephone Encounter (Signed)
Patient aware of results and copy sent to Dr. Hoyle Barr.

## 2015-09-09 ENCOUNTER — Telehealth: Payer: Self-pay | Admitting: Family Medicine

## 2015-09-09 DIAGNOSIS — F29 Unspecified psychosis not due to a substance or known physiological condition: Secondary | ICD-10-CM | POA: Diagnosis not present

## 2015-09-09 NOTE — Telephone Encounter (Signed)
Patient saw Dr. Hoyle Barr this morning and he is going to reduce his risperidone by 1 mg. So he will be taking 1 mg in the am and 2mg  in the afternoon. Dr. Hoyle Barr was not concerned with the Depakote level especially since they were decreasing his dosage. Dr. Hoyle Barr partly believe that the tremors are possibly psychosomatic and possibly side effects from the medication. The sister wants to see what you think about referrals. Do we continue with the current referrals or put a hold on them until he see's Dr. Hoyle Barr back in September?

## 2015-09-09 NOTE — Telephone Encounter (Signed)
The patient has structural issues going on his cervical spine based on the MRI received from Dr. Leana Gamer. I think he would benefit to see the neurosurgeon for further evaluation of this. As far as the tremors or concern we can hold off on that evaluation by the neurologist but at least go see the neurosurgeon regarding the MRI findings in his neck. I believe we gave 80 a copy of the MRI report. Please call his sister with this information

## 2015-09-11 NOTE — Telephone Encounter (Signed)
Patient's sister aware.

## 2015-09-17 ENCOUNTER — Telehealth: Payer: Self-pay | Admitting: Family Medicine

## 2015-09-22 ENCOUNTER — Ambulatory Visit (INDEPENDENT_AMBULATORY_CARE_PROVIDER_SITE_OTHER): Payer: PPO | Admitting: Diagnostic Neuroimaging

## 2015-09-22 ENCOUNTER — Encounter: Payer: Self-pay | Admitting: Diagnostic Neuroimaging

## 2015-09-22 VITALS — BP 151/95 | HR 75 | Ht 73.0 in | Wt 206.0 lb

## 2015-09-22 DIAGNOSIS — M4802 Spinal stenosis, cervical region: Secondary | ICD-10-CM

## 2015-09-22 DIAGNOSIS — G2119 Other drug induced secondary parkinsonism: Secondary | ICD-10-CM

## 2015-09-22 NOTE — Progress Notes (Signed)
[facility]  PATIENT: Mark Benjamin DOB: 18-Jul-1960  REFERRING CLINICIAN: Alphonse Guild HISTORY FROM: patient REASON FOR VISIT: follow up   HISTORICAL  CHIEF COMPLAINT:  Chief Complaint  Patient presents with  . Follow-up    RM 7. Last seen 06/04/14. Here for f/u on parkinson's disease. Today is a good day per pt. Having a lot of fatigue. Denies trouble walking. Feels sluggish in the morning.     HISTORY OF PRESENT ILLNESS:   UPDATE 09/22/15 (VRP): Since last visit, tremor and parkinsonism sxs continue. Saw Dr. Olga Millers, but was told that his spine issues are not amenable to surgical treatment.   UPDATE 06/04/14 (VRP): Since last visit, patient has transitioned from thioridazine to risperdal and cogentin and depakote. Left arm symptoms are stable / slightly improved. Patient not interested in surgical treatment of c-spine surg; also, was told by neurosurg that his neck issues do not need surg decompression.  UPDATE 08/10/13 (LL): Patient states that he was evaluated by Dr. Vertell Limber for cervical stenosis and was told that there was nothing seen on Myelogram that needed surgery. His Thioridazine dose is now 100 mg. Symptoms are stable. He was advised by PCP to cut out artificial sweeteners to see if that changed his symptoms; he did so 2 weeks ago. He complains of mild left leg weakness.  UPDATE 02/26/13 (VRP): Since last visit patient had MRI of the brain and cervical spine which shows moderate spinal stenosis at C5-6 level with severe bilateral foraminal stenosis. Also mild spinal stenosis and moderate right foraminal stenosis at C6-7. No cord signal abnormalities. Since last visit thioridazine dosing has been reduced. Overall his symptoms are stable to slightly progressed.  PRIOR HPI (11/21/12, VRP): 55 year old left-handed male with history of diabetes, hypercholesterolemia, schizophrenia, on thioridazine since 1986, here for evaluation of left arm weakness and numbness since  March 2014. Patient reports gradual onset, progressive numbness and weakness and slowness of his left arm. He denies any symptoms in his right arm or legs. No facial droop or facial numbness. He has noticed some slurred speech over the past one week. Patient is left-handed and has noticed difficulty with brushing his teeth with his left hand, handwriting and other fine movements. Patient's psychiatrist reduced thioridazine dose from 300 mg at bedtime down to 200 mg at bedtime last month, out of consideration of the patient's presenting symptoms as a potential side effect of medication. Since reducing the dose, no change in symptoms.   REVIEW OF SYSTEMS: Full 14 system review of systems performed and negative: fatigue rectal pain freq urination.    ALLERGIES: Allergies  Allergen Reactions  . Amoxicillin     Dizzy and blurred vision  . Erythromycin   . Sulfamethoxazole-Trimethoprim Other (See Comments)    Extreme weakness/lethorgy  . Zocor [Simvastatin]     HOME MEDICATIONS: Outpatient Prescriptions Prior to Visit  Medication Sig Dispense Refill  . benztropine (COGENTIN) 1 MG tablet Take 1 tablet by mouth 2 (two) times daily.    . Cholecalciferol (VITAMIN D3) 1000 UNITS CAPS Take 3 tablets by mouth daily.     Marland Kitchen diltiazem 2 % GEL Apply 1 application topically 2 (two) times daily. 30 g 2  . divalproex (DEPAKOTE) 250 MG DR tablet Take 250 mg by mouth 2 (two) times daily. Take 250mg  BID    . docusate sodium (COLACE) 100 MG capsule Take 100 mg by mouth 2 (two) times daily.    . fosinopril-hydrochlorothiazide (MONOPRIL-HCT) 10-12.5 MG tablet TAKE ONE  TABLET BY MOUTH ONCE DAILY 90 tablet 0  . Omega-3 Fatty Acids (FISH OIL) 1000 MG CPDR Take 1 capsule by mouth daily.    . rosuvastatin (CRESTOR) 20 MG tablet TAKE ONE TABLET BY MOUTH ONCE DAILY 90 tablet 0  . risperiDONE (RISPERDAL) 2 MG tablet Take 1 tablet by mouth 2 (two) times daily.     No facility-administered medications prior to visit.     PAST MEDICAL HISTORY: Past Medical History  Diagnosis Date  . Essential hypertension, benign   . Other and unspecified hyperlipidemia   . 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 Schizophrenia ([place])   . Hyperplasia of prostate   . Fatty liver   . Colon polyps   . Dysphagia     PAST SURGICAL HISTORY: Past Surgical History  Procedure Laterality Date  . Upper gastrointestinal endoscopy  EGD ED    09/09/2010  . Colonoscopy  02/13/08  . Colonoscopy N/A 07/10/2012    Procedure: COLONOSCOPY;  Surgeon: Rogene Houston, MD;  Location: [facility];  Service: Endoscopy;  Laterality: N/A;  225    FAMILY HISTORY: Family History  Problem Relation Age of Onset  . Inflammatory bowel disease Paternal Grandfather   . Heart failure Mother   . ALS Father     SOCIAL HISTORY:  Social History   Social History  . Marital Status: Single    Spouse Name: N/A  . Number of Children: 0  . Years of Education: 12th   Occupational History  .     Social History Main Topics  . Smoking status: Never Smoker   . Smokeless tobacco: Never Used  . Alcohol Use: Yes     Comment: very rare  . Drug Use: No  . Sexual Activity: No   Other Topics Concern  . Not on file   Social History Narrative   Patient lives at home with his mother.   Caffeine Use: none   Patient is both right and left handed.     PHYSICAL EXAM  Filed Vitals:   09/22/15 1451  BP: 151/95  Pulse: 75  Height: 6\' 1"  (1.854 m)  Weight: 206 lb (93.441 kg)    Not recorded      Body mass index is 27.18 kg/(m^2).  GENERAL EXAM: Patient is in no distress  CARDIOVASCULAR: Regular rate and rhythm, no murmurs, no carotid bruits  NEUROLOGIC: MENTAL STATUS: awake, alert, language fluent, comprehension intact, naming intact; MASKED FACIES. HESTITANT STUTTERING SPEECH CRANIAL NERVE: no papilledema on fundoscopic exam, pupils equal and reactive to light, visual fields full to confrontation,  extraocular muscles intact, no nystagmus, facial sensation and strength symmetric, uvula midline, shoulder shrug symmetric, tongue midline. MOTOR: INCREASED TONE IN LUE WITH RARE, FINE REST TREMOR. SIGNIFICANT BRADYKINESIA IN LUE. RUE AND BLE 5. LUE FINGER ABDUCTION AND EXTENSION 4.  SENSORY: normal and symmetric to light touch, temperature, vibration COORDINATION: finger-nose-finger, fine finger movements normal REFLEXES: RUE 3, LUE 3+, KNEES 3, ANKLES 2.  GAIT/STATION: narrow based gait; STOOPED POSTURE. ABSENT ARM SWING.    DIAGNOSTIC DATA (LABS, IMAGING, TESTING) - I reviewed patient records, labs, notes, testing and imaging myself where available.  Lab Results  Component Value Date   WBC 4.7 09/05/2015   HGB 14.8 08/05/2014   HCT 42.2 09/05/2015   MCV 90 09/05/2015   PLT 218 09/05/2015      Component Value Date/Time   NA 135 09/05/2015 1033   NA 140 04/24/2013 0928   K 4.0  09/05/2015 1033   CL 95* 09/05/2015 1033   CO2 24 09/05/2015 1033   GLUCOSE 91 09/05/2015 1033   GLUCOSE 94 04/24/2013 0928   BUN 6 09/05/2015 1033   BUN 10 04/24/2013 0928   CREATININE 0.83 09/05/2015 1033   CREATININE 0.86 04/24/2013 0928   CALCIUM 9.1 09/05/2015 1033   PROT 6.6 09/05/2015 1033   PROT 6.6 04/24/2013 0928   ALBUMIN 4.7 09/05/2015 1033   ALBUMIN 4.6 04/24/2013 0928   AST 19 09/05/2015 1033   ALT 18 09/05/2015 1033   ALKPHOS 60 09/05/2015 1033   BILITOT 1.8* 09/05/2015 1033   BILITOT 0.9 04/01/2014 0832   GFRNONAA 99 09/05/2015 1033   GFRNONAA >89 04/24/2013 0928   GFRAA 115 09/05/2015 1033   GFRAA >89 04/24/2013 0928   Lab Results  Component Value Date   CHOL 111 09/05/2015   HDL 46 09/05/2015   LDLCALC 60 05/02/2015   TRIG 64 09/05/2015   CHOLHDL 2.2 05/02/2015   Lab Results  Component Value Date   HGBA1C 4.8 11/08/2013   No results found for: [id number] Lab Results  Component Value Date   TSH 1.590 08/13/2015    11/29/12 MRI cervical  1. At C5-6: disc bulging  with moderate spinal stenosis and severe biforaminal stenosis  2. At C3-4, C4-5, C6-7: disc bulging with mild spinal stenosis and severe biforaminal stenosis  3. No cord signal abnormalities.  11/29/12 MRI brain (without) demonstrating:  1. Single right frontal subcortical focus (33mm) of non-specific gliosis.  2. No acute findings.  04/12/13 CT myelogram cervical spine - Central disc herniation at C3-4 with moderate central stenosis. Severe left foraminal narrowing at this level. Moderate central stenosis at C5-6 and C6-7 secondary to posterior disc osteophytes.  06/05/15 MRI cervical spine [I reviewed images myself and agree with interpretation. -VRP]  1. At C3-4 there is a mild broad-based disc bulge. Left uncovertebral degenerative change and left facet arthropathy resulting in severe left foraminal stenosis. Mild right foraminal stenosis. Mild spinal stenosis. 2. Cervical spine spondylosis as described above.    ASSESSMENT AND PLAN  55 y.o. year old male here with gradual onset, progressive left arm weakness, slowness and stiffness with some numbness. On exam, patient has masked facies, rigidity in the left arm, bradykinesia in the left arm, stooped posture and poor arm swing. Also with hyperreflexia in BUE and BLE.   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 Letitia Caul; psychiatry); I would not recommend dopaminergic medications at this time as it could exacerbate schizophrenia symptoms - physical activity / exercises; offered PT/OT eval but patient declines at this time  Return if symptoms worsen or fail to improve, for return to PCP.    Penni Bombard, MD [id number], [id number] PM Certified in Neurology, Neurophysiology and Neuroimaging  [facility] [address] [place], [place] [address] [phone]

## 2015-09-25 NOTE — Telephone Encounter (Signed)
Patient's appt was 09-22-15.

## 2015-09-30 ENCOUNTER — Other Ambulatory Visit: Payer: Self-pay | Admitting: Family Medicine

## 2015-10-01 ENCOUNTER — Other Ambulatory Visit: Payer: Self-pay | Admitting: Family Medicine

## 2015-10-21 DIAGNOSIS — H31001 Unspecified chorioretinal scars, right eye: Secondary | ICD-10-CM | POA: Diagnosis not present

## 2015-10-21 DIAGNOSIS — H2513 Age-related nuclear cataract, bilateral: Secondary | ICD-10-CM | POA: Diagnosis not present

## 2015-10-21 DIAGNOSIS — H5213 Myopia, bilateral: Secondary | ICD-10-CM | POA: Diagnosis not present

## 2015-10-21 DIAGNOSIS — E119 Type 2 diabetes mellitus without complications: Secondary | ICD-10-CM | POA: Diagnosis not present

## 2015-10-30 ENCOUNTER — Encounter (INDEPENDENT_AMBULATORY_CARE_PROVIDER_SITE_OTHER): Payer: Self-pay | Admitting: Internal Medicine

## 2015-12-02 DIAGNOSIS — F29 Unspecified psychosis not due to a substance or known physiological condition: Secondary | ICD-10-CM | POA: Diagnosis not present

## 2016-01-08 ENCOUNTER — Ambulatory Visit (INDEPENDENT_AMBULATORY_CARE_PROVIDER_SITE_OTHER): Payer: PPO | Admitting: Family Medicine

## 2016-01-08 ENCOUNTER — Encounter: Payer: Self-pay | Admitting: Family Medicine

## 2016-01-08 VITALS — BP 112/77 | HR 92 | Temp 97.7°F | Ht 73.0 in | Wt 198.0 lb

## 2016-01-08 DIAGNOSIS — G20 Parkinson's disease: Secondary | ICD-10-CM

## 2016-01-08 DIAGNOSIS — E7800 Pure hypercholesterolemia, unspecified: Secondary | ICD-10-CM | POA: Diagnosis not present

## 2016-01-08 DIAGNOSIS — I10 Essential (primary) hypertension: Secondary | ICD-10-CM

## 2016-01-08 DIAGNOSIS — M4802 Spinal stenosis, cervical region: Secondary | ICD-10-CM

## 2016-01-08 DIAGNOSIS — G2119 Other drug induced secondary parkinsonism: Secondary | ICD-10-CM | POA: Diagnosis not present

## 2016-01-08 DIAGNOSIS — G629 Polyneuropathy, unspecified: Secondary | ICD-10-CM

## 2016-01-08 DIAGNOSIS — Z23 Encounter for immunization: Secondary | ICD-10-CM | POA: Diagnosis not present

## 2016-01-08 DIAGNOSIS — R002 Palpitations: Secondary | ICD-10-CM

## 2016-01-08 DIAGNOSIS — F209 Schizophrenia, unspecified: Secondary | ICD-10-CM | POA: Diagnosis not present

## 2016-01-08 DIAGNOSIS — E559 Vitamin D deficiency, unspecified: Secondary | ICD-10-CM

## 2016-01-08 DIAGNOSIS — N400 Enlarged prostate without lower urinary tract symptoms: Secondary | ICD-10-CM

## 2016-01-08 LAB — MICROSCOPIC EXAMINATION
Bacteria, UA: NONE SEEN
EPITHELIAL CELLS (NON RENAL): NONE SEEN /HPF (ref 0–10)

## 2016-01-08 LAB — URINALYSIS, COMPLETE
Bilirubin, UA: NEGATIVE
GLUCOSE, UA: NEGATIVE
Leukocytes, UA: NEGATIVE
NITRITE UA: NEGATIVE
PROTEIN UA: NEGATIVE
SPEC GRAV UA: 1.01 (ref 1.005–1.030)
UUROB: 0.2 mg/dL (ref 0.2–1.0)
pH, UA: 7 (ref 5.0–7.5)

## 2016-01-08 NOTE — Patient Instructions (Addendum)
Medicare Annual Wellness Visit  [facility] and the medical providers at [facility] strive to bring you the best medical care.  In doing so we not only want to address your current medical conditions and concerns but also to detect new conditions early and prevent illness, disease and health-related problems.    Medicare offers a yearly Wellness Visit which allows our clinical staff to assess your need for preventative services including immunizations, lifestyle education, counseling to decrease risk of preventable diseases and screening for fall risk and other medical concerns.    This visit is provided free of charge (no copay) for all Medicare recipients. The clinical pharmacists at [facility] have begun to conduct these Wellness Visits which will also include a thorough review of all your medications.    As you primary medical provider recommend that you make an appointment for your Annual Wellness Visit if you have not done so already this year.  You may set up this appointment before you leave today or you may call back [phone] and schedule an appointment.  Please make sure when you call that you mention that you are scheduling your Annual Wellness Visit with the clinical pharmacist so that the appointment may be made for the proper length of time.     Continue current medications. Continue good therapeutic lifestyle changes which include good diet and exercise. Fall precautions discussed with patient. If an FOBT was given today- please return it to our front desk. If you are over 73 years old - you may need Prevnar 20 or the adult Pneumonia vaccine.  **Flu shots are available--- please call and schedule a FLU-CLINIC appointment**  After your visit with [place] today you will receive a survey in the mail or online from [employer] regarding your care with [place]. Please take a moment to fill this out. Your feedback is very  important to [place] as you can help [place] better understand your patient needs as well as improve your experience and satisfaction. WE CARE ABOUT YOU!!!  Continue to drink plenty of fluids and stay well hydrated Take a couple of weeks and leave off the milk cheese ice cream and dairy products and see if this helps her stomach feel better Continue to exercise regularly Continue to follow-up with psychologist We will call with your lab work results as soon as those results become available

## 2016-01-08 NOTE — Progress Notes (Signed)
Subjective:    Patient ID: Mark Benjamin, male    DOB: 1961-01-01, 55 y.o.   MRN: [id number]  HPI Pt here for follow up and management of chronic medical problems which includes hyperlipidemia and hypertension. He is taking medications regularly.The patient continues to have left arm weakness and a fast heart rate at times. He is also having some right elbow pain. He will get lab work today and his flu shot today. He is also due to return the fecal occult blood test card. He is followed regularly by mental health. He is also been to see the neurologist and the neurosurgeon regarding the weakness in his arm. The patient says he can feel like he hears his heart beat in his ears at times. He feels like it is racing at times. He still complains of the left arm weakness as mentioned above and has some right elbow pain. He is exercising regularly and this is good. He denies any chest pain or shortness of breath. He does have some tightness in his stomach daily after eating. He mentions that he hasn't had a good bowel movement this week and normally has one daily. He does drink a lot of milk and dairy products. The fissure that he had in his rectal area has cleared up and he is not having any problems with this. He is also passing his water without problems.    Patient Active Problem List   Diagnosis Date Noted  . Parkinson disease ([place]) 09/05/2015  . Vitamin D deficiency 09/05/2015  . Metabolic syndrome 84/13/2440  . Hypertension 04/02/2013  . Hyperlipidemia 04/02/2013  . BPH (benign prostatic hyperplasia) 04/02/2013  . Left arm weakness 11/21/2012  . Parkinsonism ([place]) 11/21/2012  . Neck pain 11/21/2012  . Colon adenomas 06/29/2012  . Rectal mass 06/29/2012  . Schizophrenia ([place]) 06/22/2012  . Dysphagia 06/22/2011  . Eosinophilic esophagitis 01/16/2535   Outpatient Encounter Prescriptions as of 01/08/2016  Medication Sig  . benztropine (COGENTIN) 1 MG tablet Take 1 tablet by mouth 2 (two)  times daily.  . Cholecalciferol (VITAMIN D3) 1000 UNITS CAPS Take 3 tablets by mouth daily.   Marland Kitchen diltiazem 2 % GEL Apply 1 application topically 2 (two) times daily.  . divalproex (DEPAKOTE) 250 MG DR tablet Take 250 mg by mouth 2 (two) times daily. Take 227m BID  . docusate sodium (COLACE) 100 MG capsule Take 100 mg by mouth 2 (two) times daily.  . fosinopril-hydrochlorothiazide (MONOPRIL-HCT) 10-12.5 MG tablet TAKE ONE TABLET BY MOUTH ONCE DAILY  . Omega-3 Fatty Acids (FISH OIL) 1000 MG CPDR Take 1 capsule by mouth daily.  . risperiDONE (RISPERDAL) 1 MG tablet Take 4 mg by mouth daily.   . rosuvastatin (CRESTOR) 20 MG tablet TAKE ONE TABLET BY MOUTH ONCE DAILY  . [DISCONTINUED] rosuvastatin (CRESTOR) 20 MG tablet TAKE ONE TABLET BY MOUTH ONCE DAILY  . [DISCONTINUED] fosinopril-hydrochlorothiazide (MONOPRIL-HCT) 10-12.5 MG tablet TAKE ONE TABLET BY MOUTH ONCE DAILY   No facility-administered encounter medications on file as of 01/08/2016.       Review of Systems  HENT: Negative.   Eyes: Negative.   Respiratory: Negative.   Cardiovascular: Positive for palpitations (states his heart is fast at times).  Gastrointestinal: Negative.   Endocrine: Negative.   Genitourinary: Negative.   Musculoskeletal: Positive for arthralgias (right elbow pain).  Skin: Negative.   Allergic/Immunologic: Negative.   Neurological: Positive for weakness (left arm).  Hematological: Negative.   Psychiatric/Behavioral: Negative.        Objective:  Physical Exam  Constitutional: He is oriented to person, place, and time. He appears well-developed and well-nourished. No distress.  HENT:  Head: Normocephalic and atraumatic.  Left Ear: External ear normal.  Nose: Nose normal.  Mouth/Throat: Oropharynx is clear and moist. No oropharyngeal exudate.  Ears cerumen right ear canal  Eyes: Conjunctivae and EOM are normal. Pupils are equal, round, and reactive to light. Right eye exhibits no discharge. Left eye  exhibits no discharge. No scleral icterus.  Neck: Normal range of motion. Neck supple. No thyromegaly present.  No bruits thyromegaly or anterior cervical adenopathy  Cardiovascular: Normal rate, regular rhythm, normal heart sounds and intact distal pulses.   No murmur heard. Heart was regular at 72/m  Pulmonary/Chest: Effort normal and breath sounds normal. No respiratory distress. He has no wheezes. He has no rales. He exhibits no tenderness.  No axillary adenopathy no chest wall masses  Abdominal: Soft. Bowel sounds are normal. He exhibits no mass. There is no tenderness. There is no rebound and no guarding.  No liver or spleen enlargement no epigastric tenderness no masses. No inguinal adenopathy  Genitourinary: Rectum normal and penis normal.  Genitourinary Comments: Prostate was enlarged but soft and smooth. You were no rectal masses. There is no rectal bleeding. The external genitalia were within normal limits. No inguinal hernias were palpated.  Musculoskeletal: He exhibits no edema.  Slow movement and sort of mask facial appearance.  Lymphadenopathy:    He has no cervical adenopathy.  Neurological: He is alert and oriented to person, place, and time. He has normal reflexes. No cranial nerve deficit.  Patient continues to exhibit slow movements with parkinsonian-like features. There was a slight tremor in the left hand noted today. He apologizes for the time it takes to get dressed and to get ready after the exam today. We assisted him as much as possible.  Skin: Skin is warm and dry. No rash noted.  Psychiatric: He has a normal mood and affect. His behavior is normal. Judgment and thought content normal.  The patient is fully alert and cooperative and is asking appropriate questions and definitely understands any conversation with the responses that he gets.  Nursing note and vitals reviewed.   BP 112/77 (BP Location: Left Arm)   Pulse 92   Temp 97.7 F (36.5 C) (Oral)   Ht '6\' 1"'   (1.854 m)   Wt 198 lb (89.8 kg)   BMI 26.12 kg/m   EKG: Within normal limits     Assessment & Plan:  1. Vitamin D deficiency -Continue current treatment pending results of lab work - CBC with Differential/Platelet - VITAMIN D 25 Hydroxy (Vit-D Deficiency, Fractures)  2. Pure hypercholesterolemia -Continue with aggressive therapeutic lifestyle changes and current treatment pending results of lab work - CBC with Differential/Platelet - Lipid panel  3. Essential hypertension -The blood pressure is good today and he will continue with his current treatment for the time being. - CBC with Differential/Platelet - BMP8+EGFR - Hepatic function panel - EKG 12-Lead  4. Schizophrenia, unspecified type ([place]) -Continue follow-up with Dr. lay, his psychiatrist - CBC with Differential/Platelet  5. Benign prostatic hyperplasia, unspecified whether lower urinary tract symptoms present -The patient is having no symptoms with voiding other than some frequency he had recently but this has resolved. - Urinalysis, Complete - CBC with Differential/Platelet - PSA, total and free  6. Parkinson disease ([place]) -Continue to follow-up with neurology as needed - CBC with Differential/Platelet  7. Encounter for immunization - Flu Vaccine  QUAD 36+ mos IM  8. Spinal stenosis in cervical region -The patient did see the neurosurgeon and they told him according to his history that there was nothing surgically that can be done at this point in time.  9. 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 Primary parkinsonism -Continue to follow-up with neurology as needed  12. Palpitations -Watch caffeine intake and continue to exercise regularly -Ear cerumen removed from right ear canal with  ear irrigation  Patient Instructions                       Medicare Annual Wellness Visit  [facility] and the medical providers at [facility] strive to bring you the best medical care.  In doing so we not only want to address your current medical conditions and concerns but also to detect new conditions early and prevent illness, disease and health-related problems.    Medicare offers a yearly Wellness Visit which allows our clinical staff to assess your need for preventative services including immunizations, lifestyle education, counseling to decrease risk of preventable diseases and screening for fall risk and other medical concerns.    This visit is provided free of charge (no copay) for all Medicare recipients. The clinical pharmacists at [facility] have begun to conduct these Wellness Visits which will also include a thorough review of all your medications.    As you primary medical provider recommend that you make an appointment for your Annual Wellness Visit if you have not done so already this year.  You may set up this appointment before you leave today or you may call back [phone]) and schedule an appointment.  Please make sure when you call that you mention that you are scheduling your Annual Wellness Visit with the clinical pharmacist so that the appointment may be made for the proper length of time.     Continue current medications. Continue good therapeutic lifestyle changes which include good diet and exercise. Fall precautions discussed with patient. If an FOBT was given today- please return it to our front desk. If you are over 65 years old - you may need Prevnar 12 or the adult Pneumonia vaccine.  **Flu shots are available--- please call and schedule a FLU-CLINIC appointment**  After your visit with [place] today you will receive a survey in the mail or online from [employer] regarding your care with [place]. Please take a moment to  fill this out. Your feedback is very important to [place] as you can help [place] better understand your patient needs as well as improve your experience and satisfaction. WE CARE ABOUT YOU!!!  Continue to drink plenty of fluids and stay well hydrated Take a couple of weeks and leave off the milk cheese ice cream and dairy products and see if this helps her stomach feel better Continue to exercise regularly Continue to follow-up with psychologist We will call with your lab work results as soon as those results become available    Arrie Senate MD

## 2016-01-09 LAB — BMP8+EGFR
BUN / CREAT RATIO: 8 — AB (ref 9–20)
BUN: 7 mg/dL (ref 6–24)
CHLORIDE: 95 mmol/L — AB (ref 96–106)
CO2: 26 mmol/L (ref 18–29)
Calcium: 9.7 mg/dL (ref 8.7–10.2)
Creatinine, Ser: 0.86 mg/dL (ref 0.76–1.27)
GFR, EST AFRICAN AMERICAN: 113 mL/min/{1.73_m2} (ref >59)
GFR, EST NON AFRICAN AMERICAN: 98 mL/min/{1.73_m2} (ref >59)
Glucose: 94 mg/dL (ref 65–99)
POTASSIUM: 3.9 mmol/L (ref 3.5–5.2)
SODIUM: 139 mmol/L (ref 134–144)

## 2016-01-09 LAB — HEPATIC FUNCTION PANEL
ALT: 14 IU/L (ref 0–44)
AST: 19 IU/L (ref 0–40)
Albumin: 4.9 g/dL (ref 3.5–5.5)
Alkaline Phosphatase: 62 IU/L (ref 39–117)
Bilirubin Total: 2.1 mg/dL — ABNORMAL HIGH (ref 0.0–1.2)
Bilirubin, Direct: 0.28 mg/dL (ref 0.00–0.40)
Total Protein: 7 g/dL (ref 6.0–8.5)

## 2016-01-09 LAB — LIPID PANEL
CHOLESTEROL TOTAL: 108 mg/dL (ref 100–199)
Chol/HDL Ratio: 2.3 ratio units (ref 0.0–5.0)
HDL: 47 mg/dL (ref >39)
LDL Calculated: 49 mg/dL (ref 0–99)
Triglycerides: 58 mg/dL (ref 0–149)
VLDL CHOLESTEROL CAL: 12 mg/dL (ref 5–40)

## 2016-01-09 LAB — VITAMIN D 25 HYDROXY (VIT D DEFICIENCY, FRACTURES): VIT D 25 HYDROXY: 70.6 ng/mL (ref 30.0–100.0)

## 2016-01-09 LAB — CBC WITH DIFFERENTIAL/PLATELET
BASOS: 0 %
Basophils Absolute: 0 10*3/uL (ref 0.0–0.2)
EOS (ABSOLUTE): 0.1 10*3/uL (ref 0.0–0.4)
EOS: 2 %
HEMATOCRIT: 43 % (ref 37.5–51.0)
Hemoglobin: 15 g/dL (ref 12.6–17.7)
Immature Grans (Abs): 0 10*3/uL (ref 0.0–0.1)
Immature Granulocytes: 0 %
LYMPHS ABS: 1.5 10*3/uL (ref 0.7–3.1)
Lymphs: 21 %
MCH: 31.3 pg (ref 26.6–33.0)
MCHC: 34.9 g/dL (ref 31.5–35.7)
MCV: 90 fL (ref 79–97)
MONOS ABS: 0.8 10*3/uL (ref 0.1–0.9)
Monocytes: 11 %
NEUTROS PCT: 66 %
Neutrophils Absolute: 4.5 10*3/uL (ref 1.4–7.0)
PLATELETS: 218 10*3/uL (ref 150–379)
RBC: 4.8 x10E6/uL (ref 4.14–5.80)
RDW: 12.2 % — AB (ref 12.3–15.4)
WBC: 6.9 10*3/uL (ref 3.4–10.8)

## 2016-01-09 LAB — PSA, TOTAL AND FREE
PROSTATE SPECIFIC AG, SERUM: 1.6 ng/mL (ref 0.0–4.0)
PSA FREE PCT: 30 %
PSA FREE: 0.48 ng/mL

## 2016-01-12 ENCOUNTER — Telehealth: Payer: Self-pay | Admitting: Family Medicine

## 2016-01-20 NOTE — Telephone Encounter (Signed)
Patient aware of results.

## 2016-01-29 ENCOUNTER — Other Ambulatory Visit: Payer: Self-pay | Admitting: Family Medicine

## 2016-01-29 ENCOUNTER — Ambulatory Visit (INDEPENDENT_AMBULATORY_CARE_PROVIDER_SITE_OTHER): Payer: PPO | Admitting: Internal Medicine

## 2016-01-30 MED ORDER — FOSINOPRIL SODIUM-HCTZ 10-12.5 MG PO TABS: 1.0000 | ORAL_TABLET | Freq: Every day | ORAL | 1 refills | Status: DC

## 2016-01-30 NOTE — Telephone Encounter (Signed)
done

## 2016-02-03 ENCOUNTER — Telehealth: Payer: Self-pay | Admitting: Family Medicine

## 2016-02-05 ENCOUNTER — Telehealth: Payer: Self-pay | Admitting: Family Medicine

## 2016-02-06 NOTE — Telephone Encounter (Signed)
appt scheduled

## 2016-02-10 ENCOUNTER — Ambulatory Visit (INDEPENDENT_AMBULATORY_CARE_PROVIDER_SITE_OTHER): Payer: PPO | Admitting: Family

## 2016-02-10 ENCOUNTER — Encounter: Payer: Self-pay | Admitting: Family

## 2016-02-10 VITALS — BP 126/77 | HR 84 | Temp 97.0°F | Ht 73.0 in | Wt 196.0 lb

## 2016-02-10 DIAGNOSIS — M7701 Medial epicondylitis, right elbow: Secondary | ICD-10-CM | POA: Diagnosis not present

## 2016-02-10 MED ORDER — PREDNISONE 10 MG (21) PO TBPK: ORAL_TABLET | ORAL | 0 refills | Status: DC

## 2016-02-10 MED ORDER — NAPROXEN 500 MG PO TABS: 500.0000 mg | ORAL_TABLET | Freq: Two times a day (BID) | ORAL | 1 refills | Status: DC

## 2016-02-10 NOTE — Patient Instructions (Signed)
Golfer's Elbow Golfer's elbow, also called medial epicondylitis, is a condition that results from inflammation of the strong bands of tissue (tendons) that attach your forearm muscles to the inside of your bone at the elbow. These tendons affect the muscles that bend the palm toward the wrist (flexion). This condition is called golfer's elbow because it is more common among people who constantly bend and twist their wrists, such as golfers. This injury usually results from overuse. Tendons also become less flexible with age. This condition causes elbow pain that may spread to your forearm and upper arm. The pain may get worse when you bend your wrist downward. What are the causes? This condition is an overuse injury that is caused by:  Repeatedly flexing, turning, or twisting your wrist.  Constantly gripping objects with your hands. What increases the risk? This condition is more likely to develop in people who play golf or tennis or have jobs that require the constant use of their hands. This injury is more common among:  Carpenters.  Gardeners.  Musicians.  Bricklayers.  Typists. What are the signs or symptoms? Symptoms of this condition include:  Pain near the inner elbow or forearm.  Reduced grip strength. How is this diagnosed? This condition is diagnosed based on your symptoms, medical history, and physical exam. During the exam, your health care provider may test your grip strength and move your wrist to check for pain. You may also have an MRI to confirm the diagnosis, look for other issues, and check for tears in the ligaments, muscles, or tendons. How is this treated? Treatment for this condition includes:  Stopping all activities that make you bend or twist your wrist until your pain and other symptoms go away.  Icing your wrist to relieve pain.  Taking NSAIDs or getting corticosteroid injections to reduce pain and swelling.  Doing stretches, range-of-motion, and  strengthening exercises (physical therapy) as told by your health care provider. In rare cases, surgery may be needed if your condition does not improve. Follow these instructions at home:  If directed, apply ice to the injured area.  Put ice in a plastic bag.  Place a towel between your skin and the bag.  Leave the ice on for 20 minutes, 2-3 times a day.  Move your fingers often to avoid stiffness.  Raise (elevate) the injured area above the level of your heart while you are sitting or lying down.  Return to your normal activities as told by your health care provider. Ask your health care provider what activities are safe for you.  Do exercises as told by your health care provider.  Do not use tobacco products, including cigarettes, chewing tobacco, or e-cigarettes. If you need help quitting, ask your health care provider.  Take over-the-counter and prescription medicines only as told by your health care provider.  Keep all follow-up visits as told by your health care provider. This is important. How is this prevented?  Warm up and stretch before being active.  Cool down and stretch after being active.  Give your body time to rest between periods of activity.  Make sure to use equipment that fits you.  Be safe and responsible while being active to avoid falls.  Do at least 150 minutes of moderate-intensity exercise each week, such as brisk walking or water aerobics.  Maintain physical fitness, including:  Strength.  Flexibility.  Cardiovascular fitness.  Endurance.  Perform exercises to strengthen the forearm muscles.  Slow your golf swing to reduce shock  in the arm when making contact with the ball, if you play golf. Contact a health care provider if:  Your pain does not improve or it gets worse.  You notice numbness in your hand. Get help right away if:  Your pain is severe.  You cannot move your wrist. This information is not intended to replace  advice given to you by your health care provider. Make sure you discuss any questions you have with your health care provider. Document Released: 03/08/2005 Document Revised: 11/11/2015 Document Reviewed: 11/18/2014 Elsevier Interactive Patient Education  2017 [facility] Ask your health care provider which exercises are safe for you. Do exercises exactly as told by your health care provider and adjust them as directed. It is normal to feel mild stretching, pulling, tightness, or discomfort as you do these exercises, but you should stop right away if you feel sudden pain or your pain gets worse. Do not begin these exercises until told by your health care provider. Stretching and range of motion exercises These exercises warm up your muscles and joints and improve the movement and flexibility of your elbow. Exercise A: Wrist flexors 1. Straighten your left / right elbow in front of you with your palm up. If told by your health care provider, do this stretch with your elbow bent rather than straight. 2. With your other hand, gently pull your left / right hand and fingers toward you until you feel a gentle stretch on the top of your forearm. 3. Hold this position for __________ seconds. Repeat __________ times. Complete this exercise __________ times a day. Strengthening exercises These exercises build strength and endurance in your elbow. Endurance is the ability to use your muscles for a long time, even after several repetitions. Exercise B: Wrist flexion 1. Sit with your left / right forearm palm-up and supported on a table or other surface. 2. Let your left / right wrist extend over the edge of the surface. 3. Hold a __________ weight or a piece of rubber exercise band or tubing. Slowly curl your hand up toward your forearm. Try to only move your hand and keep the rest of your arm still. 4. Hold this position for __________ seconds. 5. Slowly return to the starting  position. Repeat __________ times. Complete this exercise __________ times a day. Exercise C: Eccentric wrist flexion 1. Sit with your left / right forearm palm-up and supported on a table or other surface. 2. Let your left / right wrist extend over the edge of the surface. 3. Hold a __________ weight or a piece of rubber exercise band or tubing. 4. Use your other hand to move your left / right hand up toward your forearm. 5. Slowly return to the starting position using only your left / right hand. Repeat __________ times. Complete this exercise __________ times a day. Exercise D: Hand turns, pronation i 1. Sit with your left / right forearm supported on a table or other surface. 2. Move your wrist so your pinkie travels toward your forearm and your thumb moves away from your forearm. 3. Hold this position for __________ seconds. 4. Slowly return to the starting position. Exercise E: Hand turns, pronation ii 1. Sit with your left / right forearm supported on a table or other surface. 2. Hold a hammer in your left / right hand.  The exercise will be easier if you hold on near the head of the hammer.  If you hold on toward the end of the  handle, the exercise will be harder. 3. Rest your left / right hand over the edge of the table with your palm facing up. 4. Without moving your elbow, slowly turn your palm and your hand down toward the table. 5. Hold this position for __________ seconds. 6. Slowly return to the starting position. Repeat __________ times. Complete this exercise __________ times a day. Exercise F: Shoulder blade squeeze 1. Sit in a stable chair with good posture. Do not let your back touch the back of the chair. 2. Your arms should be at your sides with your elbows bent. You can rest your forearms on a pillow. 3. Squeeze your shoulder blades together. Keep your shoulders level. Do not lift your shoulders up toward your ears. 4. Hold this position for __________  seconds. 5. Slowly release. Repeat __________ times. Complete this exercise __________ times a day. This information is not intended to replace advice given to you by your health care provider. Make sure you discuss any questions you have with your health care provider. Document Released: 03/08/2005 Document Revised: 11/13/2015 Document Reviewed: 11/18/2014 Elsevier Interactive Patient Education  2017 [employer].

## 2016-02-10 NOTE — Progress Notes (Signed)
   Subjective:    Patient ID: KENER CABAL, male    DOB: January 29, 1961, 55 y.o.   MRN: [id number]  Pt presents to the office today  Arm Pain   The incident occurred more than 1 week ago (Sept 2017). There was no injury mechanism. The pain is present in the right elbow. The pain does not radiate. The pain is at a severity of 8/10. The pain is moderate. The pain has been fluctuating since the incident. Pertinent negatives include no numbness or tingling. The symptoms are aggravated by movement. He has tried rest, acetaminophen and NSAIDs for the symptoms. The treatment provided mild relief.      Review of Systems  Musculoskeletal: Positive for arthralgias and joint swelling.       Right elbow pain   Neurological: Negative for tingling and numbness.       Objective:   Physical Exam  Constitutional: He is oriented to person, place, and time. He appears well-developed and well-nourished.  Cardiovascular: Normal rate, regular rhythm, normal heart sounds and intact distal pulses.   Pulmonary/Chest: Effort normal and breath sounds normal.  Musculoskeletal: He exhibits tenderness.  Tenderness present in medial right forearm   Neurological: He is alert and oriented to person, place, and time.  Skin: Skin is warm and dry.  Psychiatric: He has a normal mood and affect. His behavior is normal. Judgment and thought content normal.      BP (!) 149/90   Pulse 88   Temp 97 F (36.1 C) (Oral)   Ht 6\' 1"  (1.854 m)   Wt 196 lb (88.9 kg)   BMI 25.86 kg/m      Assessment & Plan:  1. Golfer's elbow, right -Rest -Ice -ROM exercises discussed- Handout given -RTO prn or if symptoms do not improve or worsen,  - predniSONE (STERAPRED UNI-PAK 21 TAB) 10 MG (21) TBPK tablet; Use as directed  Dispense: 21 tablet; Refill: 0 - naproxen (NAPROSYN) 500 MG tablet; Take 1 tablet (500 mg total) by mouth 2 (two) times daily with a meal.  Dispense: 60 tablet; Refill: [place], FNP

## 2016-02-26 DIAGNOSIS — F29 Unspecified psychosis not due to a substance or known physiological condition: Secondary | ICD-10-CM | POA: Diagnosis not present

## 2016-03-08 ENCOUNTER — Other Ambulatory Visit: Payer: Self-pay | Admitting: Family

## 2016-03-08 DIAGNOSIS — M7701 Medial epicondylitis, right elbow: Secondary | ICD-10-CM

## 2016-03-11 ENCOUNTER — Other Ambulatory Visit: Payer: PPO

## 2016-03-11 DIAGNOSIS — Z1211 Encounter for screening for malignant neoplasm of colon: Secondary | ICD-10-CM | POA: Diagnosis not present

## 2016-03-12 LAB — FECAL OCCULT BLOOD, IMMUNOCHEMICAL: Fecal Occult Bld: NEGATIVE

## 2016-03-25 ENCOUNTER — Other Ambulatory Visit: Payer: Self-pay | Admitting: Family Medicine

## 2016-03-29 ENCOUNTER — Encounter: Payer: Self-pay | Admitting: Family Medicine

## 2016-04-06 ENCOUNTER — Encounter (INDEPENDENT_AMBULATORY_CARE_PROVIDER_SITE_OTHER): Payer: Self-pay | Admitting: Internal Medicine

## 2016-04-06 ENCOUNTER — Encounter (INDEPENDENT_AMBULATORY_CARE_PROVIDER_SITE_OTHER): Payer: Self-pay

## 2016-04-06 ENCOUNTER — Ambulatory Visit (INDEPENDENT_AMBULATORY_CARE_PROVIDER_SITE_OTHER): Payer: PPO | Admitting: Internal Medicine

## 2016-04-06 VITALS — BP 150/90 | HR 76 | Temp 98.1°F | Ht 73.0 in | Wt 200.7 lb

## 2016-04-06 DIAGNOSIS — K602 Anal fissure, unspecified: Secondary | ICD-10-CM | POA: Diagnosis not present

## 2016-04-06 NOTE — Patient Instructions (Signed)
Stool softener BID. OV in 1 year.

## 2016-04-06 NOTE — Progress Notes (Signed)
Subjective:    Patient ID: Mark Benjamin, male    DOB: December 02, 1960, 56 y.o.   MRN: [id number]  HPI 01/2015 Wt 2006. Ht 73 Here today for f/u. He was last seen November of 2016 for rectal pain . He was doing well. Hx of rectal fissure and had been using Cardizem 2%. Usually has a BM daily. Sometimes he skips a day. No melena or BRRB. No abdominal pain. He denies any rectal pain.  If he has a hard BM, he will have some pain, but resolves. Takes stool softener BID. Appetite is good. Has had 6 pound weight loss, which he says was intentional.  Last colonoscopy was in 2014. Taken Naproxen BID for tendonitis in his left arm.    Procedure:07/10/2012 Colonoscopy  Indications: Patient is 56 year old Caucasian male who was noted to have abnormal exam by Dr. Redge Gainer. He gives history of occasional hematochezia when he is constipated. His last colonoscopy was about 5 years ago with removal of single small polyp.  history is negative for colorectal carcinoma. Examination performed to cecum. Small cecal polyp ablated via cold biopsy. Two small diverticula at sigmoid colon. Small external hemorrhoids and 2 anal papillae but no evidence of rectal polyp or a mass Rehman, MD on 07/18/2012 at 3:03 PM Cecal polyp is nonadenomatous. Results reviewed with patient. Next colonoscopy in 10 years. Review of Systems Past Medical History:  Diagnosis Date  . Colon polyps   . Dysphagia   . Essential hypertension, benign   . Fatty liver   . Hyperplasia of prostate   . 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 Other and unspecified hyperlipidemia   . Schizophrenia [facility])     Past Surgical History:  Procedure Laterality Date  . COLONOSCOPY  02/13/08  . COLONOSCOPY N/A 07/10/2012   Procedure: COLONOSCOPY;  Surgeon: Rogene Houston, MD;  [facility];  Service: Endoscopy;  Laterality: N/A;  225  . UPPER GASTROINTESTINAL ENDOSCOPY  EGD ED   09/09/2010    Allergies   Allergen Reactions  . Amoxicillin     Dizzy and blurred vision  . Erythromycin   . Sulfamethoxazole-Trimethoprim Other (See Comments)    Extreme weakness/lethorgy  . Zocor [Simvastatin]     Current Outpatient Prescriptions on File Prior to Visit  Medication Sig Dispense Refill  . benztropine (COGENTIN) 1 MG tablet Take 1 tablet by mouth 2 (two) times daily.    . Cholecalciferol (VITAMIN D3) 1000 UNITS CAPS Take 3 tablets by mouth daily.     Marland Kitchen diltiazem 2 % GEL Apply 1 application topically 2 (two) times daily. (Patient taking differently: Apply 1 application topically as needed. ) 30 g 2  . divalproex (DEPAKOTE) 250 MG DR tablet Take 250 mg by mouth 2 (two) times daily. Take 250mg  BID    . docusate sodium (COLACE) 100 MG capsule Take 100 mg by mouth 2 (two) times daily.    . fosinopril-hydrochlorothiazide (MONOPRIL-HCT) 10-12.5 MG tablet Take 1 tablet by mouth daily. 90 tablet 1  . naproxen (NAPROSYN) 500 MG tablet Take 1 Tablet by mouth 2 times a day with meals 60 tablet 1  . Omega-3 Fatty Acids (FISH OIL) 1000 MG CPDR Take 3 capsules by mouth daily.     . risperiDONE (RISPERDAL) 2 MG tablet Take 2 mg by mouth 2 (two) times daily.    . rosuvastatin (CRESTOR) 20 MG tablet Take 1 Tablet by mouth once daily 90 tablet 0   No current  facility-administered medications on file prior to visit.        Objective:   Physical Exam Blood pressure (!) 150/90, pulse 76, temperature 98.1 F (36.7 C), height 6\' 1"  (1.854 m), weight 200 lb 11.2 oz (91 kg).  Alert and oriented. Skin warm and dry. Oral mucosa is moist.   . Sclera anicteric, conjunctivae is pink. Thyroid not enlarged. No cervical lymphadenopathy. Lungs clear. Heart regular rate and rhythm.  Abdomen is soft. Bowel sounds are positive. No hepatomegaly. No abdominal masses felt. No tenderness.  No edema to lower extremities.         Assessment & Plan:  Rectal pain, possible fissure which has now resolved.  Continue to use stool  softeners. Increase fiber in diet.  OV 1 year.

## 2016-04-15 ENCOUNTER — Encounter: Payer: Self-pay | Admitting: Pharmacist

## 2016-04-15 ENCOUNTER — Ambulatory Visit (INDEPENDENT_AMBULATORY_CARE_PROVIDER_SITE_OTHER): Payer: PPO | Admitting: Pharmacist

## 2016-04-15 VITALS — BP 140/90 | HR 76 | Ht 72.0 in | Wt 220.0 lb

## 2016-04-15 DIAGNOSIS — Z0000 Encounter for general adult medical examination without abnormal findings: Secondary | ICD-10-CM | POA: Diagnosis not present

## 2016-04-15 NOTE — Progress Notes (Signed)
Patient ID: Mark Benjamin, male   DOB: Dec 23, 1960, 56 y.o.   MRN: [id number]     Subjective:   Mark Benjamin is a 56 y.o. white, male who presents for a subsequent Medicare Annual Wellness Visit. Patient is single, lives alone at [address].  His sister, [name] helps a lot with his health and driving outside of [facility].  He is right handed.  Before disability in 1998 he worked in [employer] and for [place]: Negative.   Eyes: Positive for blurred vision (in right eye from time to time. patient states that there ris usually tearing and when he wipes away tear vision is OK>).  Respiratory: Negative.   Cardiovascular: Negative.   Gastrointestinal: Negative.  Constipation:    Genitourinary: Negative.   Musculoskeletal: Negative.        Diagnosed with tendonitis in right arm in December 2017.  Taking naproxen as needed.     Skin: Negative.   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  Endo/Heme/Allergies: Negative.       Current Medications (verified) Outpatient Encounter Prescriptions as of 04/15/2016  Medication Sig  . benztropine (COGENTIN) 1 MG tablet Take 1 tablet by mouth 2 (two) times daily.  . Cholecalciferol (VITAMIN D3) 1000 UNITS CAPS Take 3 tablets by mouth daily.   Marland Kitchen diltiazem 2 % GEL Apply 1 application topically 2 (two) times daily. (Patient taking differently: Apply 1 application topically as needed. )  . divalproex (DEPAKOTE) 250 MG DR tablet Take 250 mg by mouth 2 (two) times daily. Take 250mg  BID  . docusate sodium (COLACE) 100 MG capsule Take 100 mg by mouth 2 (two) times daily.  . fosinopril-hydrochlorothiazide (MONOPRIL-HCT) 10-12.5 MG tablet Take 1 tablet by mouth daily.  . naproxen (NAPROSYN) 500 MG tablet Take 1 Tablet by mouth 2 times a day with meals  . risperiDONE (RISPERDAL) 2 MG tablet Take 2 mg by mouth 2 (two) times daily.  .  rosuvastatin (CRESTOR) 20 MG tablet Take 1 Tablet by mouth once daily  . [DISCONTINUED] Omega-3 Fatty Acids (FISH OIL) 1000 MG CPDR Take 1 capsule by mouth daily.    No facility-administered encounter medications on file as of 04/15/2016.     Allergies (verified) Amoxicillin; Erythromycin; Sulfamethoxazole-trimethoprim; and Zocor [simvastatin]   History: Past Medical History:  Diagnosis Date  . Cataract   . Colon polyps   . Dysphagia   . Essential hypertension, benign   . Fatty liver   . Hyperplasia of prostate   . 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 Other and unspecified hyperlipidemia   . Schizophrenia [facility])    Past Surgical History:  Procedure Laterality Date  . COLONOSCOPY  02/13/08  . COLONOSCOPY N/A 07/10/2012   Procedure: COLONOSCOPY;  Surgeon: Rogene Houston, MD;  [facility];  Service: Endoscopy;  Laterality: N/A;  225  . UPPER GASTROINTESTINAL ENDOSCOPY  EGD ED   09/09/2010  . URETHRAL DILATION  1965   Family History  Problem Relation Age of Onset  . Heart failure Mother   . ALS Father   . Hyperlipidemia Sister   . Inflammatory bowel disease Paternal Grandfather    Social History   Occupational History  .  Not Employed   Social History Main Topics  . Smoking status: Never Smoker  . Smokeless tobacco: Never Used  . Alcohol use Yes  Comment: very rare  . Drug use: No  . Sexual activity: No    Do you feel safe at home?  Yes  Dietary issues and exercise activities discussed: Current Exercise Habits: Structured exercise class, Type of exercise: strength training/weights;treadmill, Time (Minutes): 60, Frequency (Times/Week): 2, Weekly Exercise (Minutes/Week): 120, Intensity: Moderate  Current Dietary habits:  Patient eats out 1-2 times per week.    Drinks 2 Dr Samson Frederic per day. Mostly eats sandwiches at home.  Breakfast - always has fruit     Cardiac Risk Factors include: dyslipidemia;male gender  Objective:      Today's Vitals   04/15/16 0941  BP: 140/90  Pulse: 76  Weight: 220 lb (99.8 kg)  Height: 6' (1.829 m)   Body mass index is 29.84 kg/m.   Activities of Daily Living In your present state of health, do you have any difficulty performing the following activities: 04/15/2016  Hearing? N  Vision? Y  Difficulty concentrating or making decisions? N  Walking or climbing stairs? N  Dressing or bathing? N  Doing errands, shopping? N  Preparing Food and eating ? N  Using the Toilet? N  In the past six months, have you accidently leaked urine? N  Do you have problems with loss of bowel control? N  Managing your Medications? N  Managing your Finances? N  Housekeeping or managing your Housekeeping? N  Some recent data might be hidden    Are there smokers in your home (other than you)? No    Depression Screen PHQ 2/9 Scores 04/15/2016 02/10/2016 01/08/2016 09/05/2015  PHQ - 2 Score 1 1 1 1   PHQ- 9 Score - - - -    Fall Risk Fall Risk  04/15/2016 02/10/2016 01/08/2016 09/05/2015 08/13/2015  Falls in the past year? No No No No No  Number falls in past yr: - - - - -  Injury with Fall? - - - - -    Cognitive Function: MMSE - Mini Mental State Exam 04/15/2016 04/14/2015  Orientation to time 5 5  Orientation to Place 5 5  Registration 3 3  Attention/ Calculation 5 5  Recall 3 2  Language- name 2 objects 2 2  Language- repeat 1 1  Language- follow 3 step command 3 3  Language- read & follow direction 1 1  Write a sentence 1 0  Copy design 1 0  Total score 30 27    Immunizations and Health Maintenance Immunization History  Administered Date(s) Administered  . Influenza Whole 12/20/2009  . Influenza,inj,Quad PF,36+ Mos 12/23/2014, 01/08/2016  . Tdap 07/21/2006   There are no preventive care reminders to display for this patient.  Patient Care Team: Chipper Herb, MD as PCP - General (Family Medicine) Butch Penny, NP as Nurse Practitioner (Internal Medicine) Cristal Deer, DPM as Consulting Physician (Podiatry) Truc Manus Gunning, OD as Consulting Physician (Optometry) Daisy as Consulting Physician Penni Bombard, MD as Consulting Physician (Neurology)  Indicate any recent Medical Services you may have received from other than [facility] providers in the past year (date may be approximate).    Assessment:    Annual Wellness Visit  Drug induced Parkinson's - sees psychiatrist and neurologist Tendonitis - right arm   Screening Tests Health Maintenance  Topic Date Due  . Hepatitis C Screening  05/07/2016 (Originally [birth year])  . HIV Screening  08/04/2016 (Originally 07/04/1975)  . TETANUS/TDAP  07/20/2016  . COLON CANCER SCREENING ANNUAL FOBT  03/11/2017  . INFLUENZA VACCINE  Completed        Plan:   During the course of the visit Micki was educated and counseled about the following appropriate screening and preventive services:   Vaccines to include Pneumoccal, Influenza, Hepatitis B, Td, Zostavax - all currently requires vaccines are UTD  Colorectal cancer screening - FOBT and colonoscopy are both UTD  Cardiovascular disease screening - Last EKG 2017  BP and lipids are at goal  Medication management - discontinue fish oil (last Tg were 58)  Diabetes screening - last A1c was 4.8% and FBG was 94  Glaucoma screening /  Eye Exam - UTD next appt is scheduled for March 2018  Nutrition counseling - Continue to eat 2-3 servings of fruit daily.  Discussed other options for beverages other than soda.  Patient till try to limit / discontinue.   Prostate screening - UTD  Advanced Directives - UTD, copy requested  Physical activity - Continue to go to [facility] - increase to 3 times per week   Recommended he take naproxen 500mg  bid (prescibed 02/2016) on schedule for 7 days, then he can take only as needed.  I think this will help decrease inflammation from tendonitis.    Patient Instructions (the written plan) were given to the  patient.   Cherre Robins, PharmD   04/15/2016

## 2016-04-15 NOTE — Patient Instructions (Addendum)
  Mr. Mark Benjamin , Thank you for taking time to come for your Medicare Wellness Visit. I appreciate your ongoing commitment to your health goals. Please review the following plan we discussed and let me know if I can assist you in the future.   These are the goals we discussed:  Look for copy of [place] (important to know where these are kept - you can also bring copy to our office to be placed in our file / electronic chart)  Take Naproxen 500mg  twice a day with food for 7 days.  Then take as needed for right arm tendonitis / pain.  Stop Fish Oil  Continue to exercise at [facility] - great job!   Increase non-starchy vegetables - carrots, green bean, squash, zucchini, tomatoes, onions, peppers, spinach and other green leafy vegetables, cabbage, lettuce, cucumbers, asparagus, okra (not fried), eggplant Limit sugar and processed foods (cakes, cookies, ice cream, crackers and chips) Increase fresh fruit but limit serving sizes 1/2 cup or about the size of tennis or baseball Limit red meat to no more than 1-2 times per week (serving size about the size of your palm) Choose whole grains / lean proteins - whole wheat bread, quinoa, whole grain rice (1/2 cup), fish, chicken, [place] Avoid sugar and calorie containing beverages - soda, sweet tea and juice.  Choose water or unsweetened tea instead.   This is a list of the screening recommended for you and due dates:  Health Maintenance  Topic Date Due  .  Hepatitis C: One time screening is recommended by Center for Disease Control  (CDC) for  adults born from [birth year] through 1965.   05/07/2016*  . HIV Screening  08/04/2016*  . Tetanus Vaccine  07/20/2016  . Stool Blood Test  03/11/2017  . Flu Shot  Completed  *Topic was postponed. The date shown is not the original due date.

## 2016-05-25 ENCOUNTER — Telehealth: Payer: Self-pay | Admitting: Family Medicine

## 2016-05-25 NOTE — Telephone Encounter (Signed)
Spoke with patient's sister. She states that Mark Benjamin is complaining of feeling tired / lethargic in the morning after he takes his BP medication.  Is not taking Risperdal until noon and then at bedtime.  BP readings in office have been elevated for the last 2 office visits.  He has appt with Dr Laurance Flatten 06/03/16.  Recommended patient check BP at home and bring in to his next appt. Will adjust BP med based on those readings.

## 2016-06-01 ENCOUNTER — Ambulatory Visit: Payer: PPO | Admitting: Family Medicine

## 2016-06-03 ENCOUNTER — Encounter: Payer: Self-pay | Admitting: Family Medicine

## 2016-06-03 ENCOUNTER — Ambulatory Visit (INDEPENDENT_AMBULATORY_CARE_PROVIDER_SITE_OTHER): Payer: PPO | Admitting: Family Medicine

## 2016-06-03 ENCOUNTER — Telehealth: Payer: Self-pay | Admitting: Diagnostic Neuroimaging

## 2016-06-03 VITALS — BP 124/71 | HR 84 | Temp 97.6°F | Ht 72.0 in | Wt 195.0 lb

## 2016-06-03 DIAGNOSIS — E7800 Pure hypercholesterolemia, unspecified: Secondary | ICD-10-CM | POA: Diagnosis not present

## 2016-06-03 DIAGNOSIS — F209 Schizophrenia, unspecified: Secondary | ICD-10-CM

## 2016-06-03 DIAGNOSIS — M2560 Stiffness of unspecified joint, not elsewhere classified: Secondary | ICD-10-CM

## 2016-06-03 DIAGNOSIS — I10 Essential (primary) hypertension: Secondary | ICD-10-CM

## 2016-06-03 DIAGNOSIS — N400 Enlarged prostate without lower urinary tract symptoms: Secondary | ICD-10-CM

## 2016-06-03 DIAGNOSIS — M79602 Pain in left arm: Secondary | ICD-10-CM

## 2016-06-03 DIAGNOSIS — G20 Parkinson's disease: Secondary | ICD-10-CM

## 2016-06-03 DIAGNOSIS — R29898 Other symptoms and signs involving the musculoskeletal system: Secondary | ICD-10-CM

## 2016-06-03 DIAGNOSIS — R531 Weakness: Secondary | ICD-10-CM

## 2016-06-03 DIAGNOSIS — M2569 Stiffness of other specified joint, not elsewhere classified: Secondary | ICD-10-CM

## 2016-06-03 DIAGNOSIS — E559 Vitamin D deficiency, unspecified: Secondary | ICD-10-CM

## 2016-06-03 NOTE — Telephone Encounter (Signed)
Patient's sister calling stating patient saw Dr.Donald Laurance Flatten today and he suggested he see Dr. Leta Baptist for a decrease in range of motion in his left arm and leg and overall body weakness. I offered an appointment for July but she thinks he needs to be seen sooner.

## 2016-06-03 NOTE — Telephone Encounter (Signed)
Called Ms Edilia Bo, sister and scheduled patient for 06/15/16. Advised he needs to arrive 20-30 min early. She then requested this RN call patient on his cell and advise. LVM for patient with appt details, left number for any questions.

## 2016-06-03 NOTE — Patient Instructions (Addendum)
Medicare Annual Wellness Visit  [facility] and the medical providers at [facility] strive to bring you the best medical care.  In doing so we not only want to address your current medical conditions and concerns but also to detect new conditions early and prevent illness, disease and health-related problems.    Medicare offers a yearly Wellness Visit which allows our clinical staff to assess your need for preventative services including immunizations, lifestyle education, counseling to decrease risk of preventable diseases and screening for fall risk and other medical concerns.    This visit is provided free of charge (no copay) for all Medicare recipients. The clinical pharmacists at [facility] have begun to conduct these Wellness Visits which will also include a thorough review of all your medications.    As you primary medical provider recommend that you make an appointment for your Annual Wellness Visit if you have not done so already this year.  You may set up this appointment before you leave today or you may call back ([phone]) and schedule an appointment.  Please make sure when you call that you mention that you are scheduling your Annual Wellness Visit with the clinical pharmacist so that the appointment may be made for the proper length of time.     Continue current medications. Continue good therapeutic lifestyle changes which include good diet and exercise. Fall precautions discussed with patient. If an FOBT was given today- please return it to our front desk. If you are over 39 years old - you may need Prevnar 66 or the adult Pneumonia vaccine.  **Flu shots are available--- please call and schedule a FLU-CLINIC appointment**  After your visit with [place] today you will receive a survey in the mail or online from [employer] regarding your care with [place]. Please take a moment to fill this out. Your feedback is very  important to [place] as you can help [place] better understand your patient needs as well as improve your experience and satisfaction. WE CARE ABOUT YOU!!!  The patient should continue to follow-up with Dr. Hoyle Barr at behavioral health He should increase his activity level by doing more walking as this will help his movement disorder We will arrange for him to have an appointment with the neurologist that he is seen in the past because of his progressive motor decline and weakness. We will call with the results of the blood work as soon as these results become available

## 2016-06-03 NOTE — Progress Notes (Signed)
Subjective:    Patient ID: Mark Benjamin, male    DOB: 12/18/1960, 56 y.o.   MRN: [id number]  HPI Pt here for follow up and management of chronic medical problems which includes hyperlipidemia and hypertension. He is taking medication regularly.The patient today is complaining with weakness and fatigue and slow range of motion. He is wondering if some physical therapy might be helpful for his left arm. Is also concerned about weight loss. His weight at a previous visit in January was 220 today it is 195. His body mass index remains elevated at 29.9. The patient says he's had increased fatigue and weakness. He only eats 2 meals a day because he is so weak that is hard for him to clean up the pots and pans after fixing food. He has lost a significant amount of weight. He denies any chest pain or shortness of breath. He denies any trouble with his intestinal tract including heartburn indigestion nausea vomiting diarrhea blood in the stool or black tarry bowel movements. He's passing his water without problems. He does have a history of schizophrenia. He has been seeing for the Parkinson's symptoms by [facility] neurology and has not seen them since last summer. We will encourage him to go back for another visit as some of the symptoms he seems to be having may be a progression of his movement disorder. He especially is concerned about the use of his left arm and he is left-handed and has trouble getting his clothes on and writing checks for paying his bills. We will go ahead and arrange for some physical therapy per his request for the left arm while we await an appointment for him to see the neurologist with [facility].   Patient Active Problem List   Diagnosis Date Noted  . Parkinson disease ([place]) 09/05/2015  . Vitamin D deficiency 09/05/2015  . Metabolic syndrome 27/51/7001  . Hypertension 04/02/2013  . Hyperlipidemia 04/02/2013  . BPH (benign prostatic hyperplasia) 04/02/2013  . Left arm  weakness 11/21/2012  . Parkinsonism ([place]) 11/21/2012  . Neck pain 11/21/2012  . Colon adenomas 06/29/2012  . Rectal mass 06/29/2012  . Schizophrenia ([place]) 06/22/2012  . Dysphagia 06/22/2011  . Eosinophilic esophagitis 74/94/4967   Outpatient Encounter Prescriptions as of 06/03/2016  Medication Sig  . benztropine (COGENTIN) 1 MG tablet Take 1 tablet by mouth 2 (two) times daily.  . Cholecalciferol (VITAMIN D3) 1000 UNITS CAPS Take 3 tablets by mouth daily.   Marland Kitchen diltiazem 2 % GEL Apply 1 application topically 2 (two) times daily. (Patient taking differently: Apply 1 application topically as needed. )  . divalproex (DEPAKOTE) 250 MG DR tablet Take 250 mg by mouth 2 (two) times daily. Take 215m BID  . docusate sodium (COLACE) 100 MG capsule Take 100 mg by mouth 2 (two) times daily.  . fosinopril-hydrochlorothiazide (MONOPRIL-HCT) 10-12.5 MG tablet Take 1 tablet by mouth daily.  . Multiple Vitamin (MULTIVITAMIN) tablet Take 1 tablet by mouth daily. For 50 Plus  . naproxen (NAPROSYN) 500 MG tablet Take 1 Tablet by mouth 2 times a day with meals  . risperiDONE (RISPERDAL) 2 MG tablet Take 2 mg by mouth 2 (two) times daily.  . rosuvastatin (CRESTOR) 20 MG tablet Take 1 Tablet by mouth once daily   No facility-administered encounter medications on file as of 06/03/2016.       Review of Systems  Constitutional: Positive for fatigue.  HENT: Negative.   Eyes: Negative.   Respiratory: Negative.   Cardiovascular: Negative.  Gastrointestinal: Negative.   Endocrine: Negative.   Genitourinary: Negative.   Musculoskeletal: Positive for arthralgias (left arm pain).  Skin: Negative.   Allergic/Immunologic: Negative.   Neurological: Positive for weakness.       Slow ROM  Hematological: Negative.   Psychiatric/Behavioral: Negative.        Objective:   Physical Exam  Constitutional: He is oriented to person, place, and time. He appears well-developed and well-nourished.  The patient is  pleasant and alert. He has rigidity and no emotion. His facial expressions are flat. His movements are very slow.  HENT:  Head: Normocephalic and atraumatic.  Nose: Nose normal.  Mouth/Throat: Oropharynx is clear and moist. No oropharyngeal exudate.  Bilateral ear cerumen  Eyes: Conjunctivae and EOM are normal. Pupils are equal, round, and reactive to light. Right eye exhibits no discharge. Left eye exhibits no discharge. No scleral icterus.  Neck: Normal range of motion. Neck supple. No thyromegaly present.  No bruits thyromegaly or anterior cervical adenopathy  Cardiovascular: Normal rate, regular rhythm, normal heart sounds and intact distal pulses.   No murmur heard. The heart is regular at 72/m  Pulmonary/Chest: Effort normal and breath sounds normal. No respiratory distress. He has no wheezes. He has no rales. He exhibits no tenderness.  Clear anteriorly and posteriorly  Abdominal: Soft. Bowel sounds are normal. He exhibits no mass. There is no tenderness. There is no rebound and no guarding.  No abdominal tenderness masses bruits or organ enlargement palpable  Musculoskeletal: Normal range of motion. He exhibits no edema or tenderness.  The patient has good range of motion but is very slow and all of his movements of all of his extremities.  Lymphadenopathy:    He has no cervical adenopathy.  Neurological: He is alert and oriented to person, place, and time. He has normal reflexes. No cranial nerve deficit.  Muscle rigidity and extremity weakness with stiffness.  Skin: Skin is warm and dry. No rash noted.  Psychiatric: He has a normal mood and affect. His behavior is normal. Judgment and thought content normal.  The patient is obviously somewhat upset regarding his declining feelings of independence due to his musculoskeletal symptoms.  Nursing note and vitals reviewed.  BP 124/71 (BP Location: Left Arm)   Pulse 84   Temp 97.6 F (36.4 C) (Oral)   Ht 6' (1.829 m)   Wt 195 lb  (88.5 kg)   BMI 26.45 kg/m         Assessment & Plan:  1. Pure hypercholesterolemia -Continue with aggressive therapeutic lifestyle changes with diet and increase exercise activity. - CBC with Differential/Platelet - Lipid panel  2. Vitamin D deficiency -Any current treatment pending results of lab work - CBC with Differential/Platelet - VITAMIN D 25 Hydroxy (Vit-D Deficiency, Fractures)  3. Essential hypertension -The blood pressure is good today and he will be no change in treatment - CBC with Differential/Platelet - BMP8+EGFR - Hepatic function panel  4. Schizophrenia, unspecified type ([place]) -Continue to follow-up with psychiatry and behavioral health - CBC with Differential/Platelet  5. Parkinson disease [facility]) -Neurology for further evaluation and management of his current symptoms of increased rigidity and weakness - CBC with Differential/Platelet  6. Benign prostatic hyperplasia, unspecified whether lower urinary tract symptoms present -No voiding symptoms at this visit - CBC with Differential/Platelet  7. Decreased ROM of trunk and back -This seems to have progressed since the last visit. - Thyroid Panel With TSH - Vitamin B12 - Ambulatory referral to Physical Therapy  8.  Weakness -According to the patient this is also progressed. - Thyroid Panel With TSH - Vitamin B12 - Ambulatory referral to Physical Therapy  9. Left arm pain - Ambulatory referral to Physical Therapy  Patient Instructions                       Medicare Annual Wellness Visit  [facility] and the medical providers at [facility] strive to bring you the best medical care.  In doing so we not only want to address your current medical conditions and concerns but also to detect new conditions early and prevent illness, disease and health-related problems.    Medicare offers a yearly Wellness Visit which allows our clinical staff to assess your need for preventative  services including immunizations, lifestyle education, counseling to decrease risk of preventable diseases and screening for fall risk and other medical concerns.    This visit is provided free of charge (no copay) for all Medicare recipients. The clinical pharmacists at [facility] have begun to conduct these Wellness Visits which will also include a thorough review of all your medications.    As you primary medical provider recommend that you make an appointment for your Annual Wellness Visit if you have not done so already this year.  You may set up this appointment before you leave today or you may call back ([phone]) and schedule an appointment.  Please make sure when you call that you mention that you are scheduling your Annual Wellness Visit with the clinical pharmacist so that the appointment may be made for the proper length of time.     Continue current medications. Continue good therapeutic lifestyle changes which include good diet and exercise. Fall precautions discussed with patient. If an FOBT was given today- please return it to our front desk. If you are over 45 years old - you may need Prevnar 8 or the adult Pneumonia vaccine.  **Flu shots are available--- please call and schedule a FLU-CLINIC appointment**  After your visit with [place] today you will receive a survey in the mail or online from [employer] regarding your care with [place]. Please take a moment to fill this out. Your feedback is very important to [place] as you can help [place] better understand your patient needs as well as improve your experience and satisfaction. WE CARE ABOUT YOU!!!  The patient should continue to follow-up with Dr. Hoyle Barr at behavioral health He should increase his activity level by doing more walking as this will help his movement disorder We will arrange for him to have an appointment with the neurologist that he is seen in the past because of his progressive motor decline and  weakness. We will call with the results of the blood work as soon as these results become available   Arrie Senate MD

## 2016-06-04 ENCOUNTER — Telehealth: Payer: Self-pay | Admitting: Family Medicine

## 2016-06-04 LAB — BMP8+EGFR
BUN / CREAT RATIO: 10 (ref 9–20)
BUN: 9 mg/dL (ref 6–24)
CALCIUM: 9.7 mg/dL (ref 8.7–10.2)
CHLORIDE: 97 mmol/L (ref 96–106)
CO2: 25 mmol/L (ref 18–29)
Creatinine, Ser: 0.9 mg/dL (ref 0.76–1.27)
GFR calc Af Amer: 111 mL/min/{1.73_m2} (ref >59)
GFR calc non Af Amer: 96 mL/min/{1.73_m2} (ref >59)
GLUCOSE: 90 mg/dL (ref 65–99)
Potassium: 3.9 mmol/L (ref 3.5–5.2)
Sodium: 139 mmol/L (ref 134–144)

## 2016-06-04 LAB — CBC WITH DIFFERENTIAL/PLATELET
BASOS ABS: 0 10*3/uL (ref 0.0–0.2)
Basos: 1 %
EOS (ABSOLUTE): 0.1 10*3/uL (ref 0.0–0.4)
Eos: 2 %
HEMOGLOBIN: 13.7 g/dL (ref 13.0–17.7)
Hematocrit: 39.2 % (ref 37.5–51.0)
IMMATURE GRANS (ABS): 0 10*3/uL (ref 0.0–0.1)
IMMATURE GRANULOCYTES: 0 %
LYMPHS: 23 %
Lymphocytes Absolute: 1.2 10*3/uL (ref 0.7–3.1)
MCH: 30.9 pg (ref 26.6–33.0)
MCHC: 34.9 g/dL (ref 31.5–35.7)
MCV: 88 fL (ref 79–97)
MONOCYTES: 11 %
Monocytes Absolute: 0.6 10*3/uL (ref 0.1–0.9)
NEUTROS PCT: 63 %
Neutrophils Absolute: 3.5 10*3/uL (ref 1.4–7.0)
PLATELETS: 191 10*3/uL (ref 150–379)
RBC: 4.44 x10E6/uL (ref 4.14–5.80)
RDW: 12.6 % (ref 12.3–15.4)
WBC: 5.4 10*3/uL (ref 3.4–10.8)

## 2016-06-04 LAB — HEPATIC FUNCTION PANEL
ALBUMIN: 4.7 g/dL (ref 3.5–5.5)
ALT: 15 IU/L (ref 0–44)
AST: 21 IU/L (ref 0–40)
Alkaline Phosphatase: 49 IU/L (ref 39–117)
BILIRUBIN, DIRECT: 0.32 mg/dL (ref 0.00–0.40)
Bilirubin Total: 1.6 mg/dL — ABNORMAL HIGH (ref 0.0–1.2)
TOTAL PROTEIN: 6.7 g/dL (ref 6.0–8.5)

## 2016-06-04 LAB — LIPID PANEL
Chol/HDL Ratio: 2.2 ratio units (ref 0.0–5.0)
Cholesterol, Total: 110 mg/dL (ref 100–199)
HDL: 50 mg/dL (ref >39)
LDL CALC: 50 mg/dL (ref 0–99)
Triglycerides: 48 mg/dL (ref 0–149)
VLDL CHOLESTEROL CAL: 10 mg/dL (ref 5–40)

## 2016-06-04 LAB — THYROID PANEL WITH TSH
FREE THYROXINE INDEX: 2.2 (ref 1.2–4.9)
T3 Uptake Ratio: 27 % (ref 24–39)
T4, Total: 8.1 ug/dL (ref 4.5–12.0)
TSH: 1.31 u[IU]/mL (ref 0.450–4.500)

## 2016-06-04 LAB — VITAMIN B12: VITAMIN B 12: 523 pg/mL (ref 232–1245)

## 2016-06-04 LAB — VITAMIN D 25 HYDROXY (VIT D DEFICIENCY, FRACTURES): VIT D 25 HYDROXY: 102 ng/mL — AB (ref 30.0–100.0)

## 2016-06-04 NOTE — Telephone Encounter (Signed)
Patients family member would like to set up physical therapy the week after Easter.

## 2016-06-08 NOTE — Telephone Encounter (Signed)
PT has tried to contact pt for scheduling already, but I placed a note in the referral for preferred times

## 2016-06-14 ENCOUNTER — Ambulatory Visit: Payer: PPO | Attending: Family Medicine | Admitting: Physical Therapy

## 2016-06-14 DIAGNOSIS — M25612 Stiffness of left shoulder, not elsewhere classified: Secondary | ICD-10-CM | POA: Diagnosis not present

## 2016-06-14 DIAGNOSIS — M25622 Stiffness of left elbow, not elsewhere classified: Secondary | ICD-10-CM | POA: Diagnosis not present

## 2016-06-14 NOTE — Therapy (Signed)
[facility] [place], [place], [address] Phone: [phone]   Fax:  [phone]  Physical Therapy Evaluation  Patient Details  Name: Mark Benjamin MRN: [id number] Date of Birth: September 02, 1960 Referring Provider: Redge Gainer MD.  Encounter Date: 06/14/2016      PT End of Session - 06/14/16 1656    Visit Number 1   Number of Visits 16   Date for PT Re-Evaluation 08/13/16   PT Start Time 0145   PT Stop Time 0231   PT Time Calculation (min) 46 min   Activity Tolerance Patient tolerated treatment well   Behavior During Therapy [facility] for tasks assessed/performed      Past Medical History:  Diagnosis Date  . Cataract   . Colon polyps   . Dysphagia   . Essential hypertension, benign   . Fatty liver   . Hyperplasia of prostate   . 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 Other and unspecified hyperlipidemia   . Schizophrenia [facility])     Past Surgical History:  Procedure Laterality Date  . COLONOSCOPY  02/13/08  . COLONOSCOPY N/A 07/10/2012   Procedure: COLONOSCOPY;  Surgeon: Rogene Houston, MD;  Location: [facility] ENDO SUITE;  Service: Endoscopy;  Laterality: N/A;  225  . UPPER GASTROINTESTINAL ENDOSCOPY  [facility] ED   09/09/2010  . URETHRAL DILATION  1965    There were no vitals filed for this visit.       Subjective Assessment - 06/14/16 1649    Pertinent History Parkinson.  Schizophrenia.   Currently in Pain? No/denies            [facility] PT Assessment - 06/14/16 0001      Assessment   Medical Diagnosis left arm pain.   Referring Provider Redge Gainer MD.   Onset Date/Surgical Date --  Ongoing.   Hand Dominance Left     Precautions   Precautions Fall     Restrictions   Weight Bearing Restrictions No     Balance Screen   Has the patient fallen in the past 6 months No   Has the patient had a decrease in activity level because of a fear of falling?  Yes   Is the patient reluctant to  leave their home because of a fear of falling?  Yes     [place] residence     Prior Function   Level of Independence Independent     Cognition   Overall Cognitive Status Within Functional Limits for tasks assessed     Posture/Postural Control   Posture Comments The patient's left UE is held in a very guarded posture with his elbow held in flexion and against his abdomen.     Tone   Assessment Location Left Upper Extremity     ROM / Strength   AROM / PROM / Strength PROM;Strength     PROM   Overall PROM Comments Performed passive range of motion to patient's left UE as he seemed reluctant to actively move his left UE.  Flexion is limited to 90 degrees; ER= 77 degrees and horiz abduction= 0 degrees.  Left elbow extension to -7 degrees; forearm supination= 45 degrees.  The patient can make a functional fist actively.     Strength   Overall Strength Comments Left UE strength grossly garde at 4/5 today.     Palpation   Palpation comment No specific areas of left UE palpable pain reported today.  Transfers   Comments Sit to stand with use of hands.     Ambulation/Gait   Gait Pattern Festinating     LUE Tone   LUE Tone Moderate                   [facility] Adult PT Treatment/Exercise - 06/14/16 0001      Exercises   Exercises Knee/Hip     Knee/Hip Exercises: Aerobic   Nustep Level 3 x 15 minutes.                  PT Short Term Goals - 06/14/16 1721      PT SHORT TERM GOAL #1   Title Independent with an initial HEP.   Time 4   Period Weeks   Status New     PT SHORT TERM GOAL #2   Title Restore full left UE PROM.   Time 4   Period Weeks   Status New           PT Long Term Goals - 06/14/16 1723      PT LONG TERM GOAL #1   Title Independent with an advanced HEP.   Time 8   Period Weeks   Status New     PT LONG TERM GOAL #2   Title Active left shoulder flexion to 145 degrees so the patient can  easily reach overhead   Time 8   Period Weeks   Status New     PT LONG TERM GOAL #3   Title Active ER to 70 degrees+ to allow for easily donning/doffing of apparel   Time 8   Period Weeks   Status New     PT LONG TERM GOAL #4   Title Increase left shoulder strength to a solid 4+/5 to increase stability for performance of functional activities   Time 8   Period Weeks   Status New     PT LONG TERM GOAL #5   Title Perform ADL's with no complaints.   Time 8   Period Weeks   Status New               Plan - 06/14/16 1715    Clinical Impression Statement The patient prsenst to OPPT with a progressing loss of function in his left UE.  He is very guarded with regards to his left UE.  Patient reluctant to actively move his left UE but reported no pain today.  He has significant losses of left UE strength and range of motion that impair his function. He is left hand dominant.  Patient will benefit from skilled physicalt herapy.   Rehab Potential Good   PT Frequency 2x / week   PT Duration 8 weeks   PT Treatment/Interventions ADLs/Self Care Home Management;Electrical Stimulation;Moist Heat;Ultrasound;Patient/family education;Therapeutic exercise;Therapeutic activities;Manual techniques;Passive range of motion;Vasopneumatic Device;Dry needling   PT Next Visit Plan Modalites as needed should pain be reported.  Nustep; left UE range of motion with progression to strengthening.  Please supervise patient at all times as he is at risk for falls.   Consulted and Agree with Plan of Care Patient      Patient will benefit from skilled therapeutic intervention in order to improve the following deficits and impairments:  Decreased activity tolerance, Decreased strength, Decreased range of motion, Impaired UE functional use, Impaired tone  Visit Diagnosis: Stiffness of left shoulder, not elsewhere classified - Plan: PT plan of care cert/re-cert  Stiffness of left elbow, not elsewhere  classified - Plan:  PT plan of care cert/re-cert      G-Codes - 39/76/73 1426    Functional Assessment Tool Used (Outpatient Only) FOTO.Marland KitchenMarland KitchenMarland Kitchen54% limitation.   Functional Limitation Carrying, moving and handling objects   Carrying, Moving and Handling Objects Current Status [phone]) At least 40 percent but less than 60 percent impaired, limited or restricted   Carrying, Moving and Handling Objects Goal Status ([id number]) At least 20 percent but less than 40 percent impaired, limited or restricted       Problem List Patient Active Problem List   Diagnosis Date Noted  . Parkinson disease ([place]) 09/05/2015  . Vitamin D deficiency 09/05/2015  . Metabolic syndrome 97/35/3299  . Hypertension 04/02/2013  . Hyperlipidemia 04/02/2013  . BPH (benign prostatic hyperplasia) 04/02/2013  . Left arm weakness 11/21/2012  . Parkinsonism ([place]) 11/21/2012  . Neck pain 11/21/2012  . Colon adenomas 06/29/2012  . Rectal mass 06/29/2012  . Schizophrenia ([place]) 06/22/2012  . Dysphagia 06/22/2011  . Eosinophilic esophagitis 24/26/8341    Jais Demir, [place] MPT 06/14/2016, 5:29 PM  [facility] [address] [place], [place], [address] Phone: [phone]   Fax:  [phone]  Name: Mark Benjamin MRN: [id number] Date of Birth: 10-Apr-1960

## 2016-06-15 ENCOUNTER — Encounter: Payer: Self-pay | Admitting: Diagnostic Neuroimaging

## 2016-06-15 ENCOUNTER — Ambulatory Visit (INDEPENDENT_AMBULATORY_CARE_PROVIDER_SITE_OTHER): Payer: PPO | Admitting: Diagnostic Neuroimaging

## 2016-06-15 VITALS — BP 98/65 | HR 84 | Ht 72.0 in | Wt 190.0 lb

## 2016-06-15 DIAGNOSIS — M4802 Spinal stenosis, cervical region: Secondary | ICD-10-CM

## 2016-06-15 DIAGNOSIS — G2119 Other drug induced secondary parkinsonism: Secondary | ICD-10-CM | POA: Diagnosis not present

## 2016-06-15 NOTE — Progress Notes (Signed)
[facility]  PATIENT: Mark Benjamin DOB: Apr 08, 1960  REFERRING CLINICIAN: Alphonse Benjamin HISTORY FROM: patient and sister REASON FOR VISIT: follow up   HISTORICAL  CHIEF COMPLAINT:  Chief Complaint  Patient presents with  . Drug-induced Parkinsonism    rm 6, sister-  Mark Benjamin, "right arm weaker; legs get wobbly at times; began PT yesterday"  . Follow-up    PCP- symptoms worsening    HISTORY OF PRESENT ILLNESS:   UPDATE 06/15/16: Since last visit, tremor, slowness, stiffness continue. Here to look at other options for treatment. Schizophrenia stable. More balance and coordination issues. Patient lives alone. Driving short distances.   UPDATE 09/22/15 (VRP): Since last visit, tremor and parkinsonism sxs continue. Saw Dr. Olga Benjamin, but was told that his spine issues are not amenable to surgical treatment.   UPDATE 06/04/14 (VRP): Since last visit, patient has transitioned from thioridazine to risperdal and cogentin and depakote. Left arm symptoms are stable / slightly improved. Patient not interested in surgical treatment of c-spine surg; also, was told by neurosurg that his neck issues do not need surg decompression.  UPDATE 08/10/13 (LL): Patient states that he was evaluated by Dr. Vertell Benjamin for cervical stenosis and was told that there was nothing seen on Myelogram that needed surgery. His Thioridazine dose is now 100 mg. Symptoms are stable. He was advised by PCP to cut out artificial sweeteners to see if that changed his symptoms; he did so 2 weeks ago. He complains of mild left leg weakness.  UPDATE 02/26/13 (VRP): Since last visit patient had MRI of the brain and cervical spine which shows moderate spinal stenosis at C5-6 level with severe bilateral foraminal stenosis. Also mild spinal stenosis and moderate right foraminal stenosis at C6-7. No cord signal abnormalities. Since last visit thioridazine dosing has been reduced. Overall his symptoms are stable to slightly  progressed.  PRIOR HPI (11/21/12, VRP): 56 year old left-handed male with history of diabetes, hypercholesterolemia, schizophrenia, on thioridazine since 1986, here for evaluation of left arm weakness and numbness since March 2014. Patient reports gradual onset, progressive numbness and weakness and slowness of his left arm. He denies any symptoms in his right arm or legs. No facial droop or facial numbness. He has noticed some slurred speech over the past one week. Patient is left-handed and has noticed difficulty with brushing his teeth with his left hand, handwriting and other fine movements. Patient's psychiatrist reduced thioridazine dose from 300 mg at bedtime down to 200 mg at bedtime last month, out of consideration of the patient's presenting symptoms as a potential side effect of medication. Since reducing the dose, no change in symptoms.   REVIEW OF SYSTEMS: Full 14 system review of systems performed and negative: dizziness weakness depression blurred vision joint pain insomnia daytime sleepiness freq urination urgency.    ALLERGIES: Allergies  Allergen Reactions  . Amoxicillin     Dizzy and blurred vision  . Erythromycin   . Sulfamethoxazole-Trimethoprim Other (See Comments)    Extreme weakness/lethorgy  . Zocor [Simvastatin]     HOME MEDICATIONS: Outpatient Medications Prior to Visit  Medication Sig Dispense Refill  . benztropine (COGENTIN) 1 MG tablet Take 1 tablet by mouth 2 (two) times daily.    . Cholecalciferol (VITAMIN D3) 1000 UNITS CAPS Take 3 tablets by mouth daily.     Marland Kitchen diltiazem 2 % GEL Apply 1 application topically 2 (two) times daily. (Patient taking differently: Apply 1 application topically as needed. ) 30 g 2  . divalproex (DEPAKOTE)  250 MG DR tablet Take 250 mg by mouth 2 (two) times daily. Take 250mg  BID    . docusate sodium (COLACE) 100 MG capsule Take 100 mg by mouth 2 (two) times daily.    . fosinopril-hydrochlorothiazide (MONOPRIL-HCT) 10-12.5 MG tablet  Take 1 tablet by mouth daily. 90 tablet 1  . Multiple Vitamin (MULTIVITAMIN) tablet Take 1 tablet by mouth daily. For 50 Plus    . naproxen (NAPROSYN) 500 MG tablet Take 1 Tablet by mouth 2 times a day with meals 60 tablet 1  . risperiDONE (RISPERDAL) 2 MG tablet Take 2 mg by mouth 2 (two) times daily.    . rosuvastatin (CRESTOR) 20 MG tablet Take 1 Tablet by mouth once daily 90 tablet 0   No facility-administered medications prior to visit.     PAST MEDICAL HISTORY: Past Medical History:  Diagnosis Date  . Cataract   . Colon polyps   . Dysphagia   . Essential hypertension, benign   . Fatty liver   . Hyperplasia of prostate   . 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 Other and unspecified hyperlipidemia   . Schizophrenia ([place])     PAST SURGICAL HISTORY: Past Surgical History:  Procedure Laterality Date  . COLONOSCOPY  02/13/08  . COLONOSCOPY N/A 07/10/2012   Procedure: COLONOSCOPY;  Surgeon: Mark Houston, MD;  [facility];  Service: Endoscopy;  Laterality: N/A;  225  . UPPER GASTROINTESTINAL ENDOSCOPY  EGD ED   09/09/2010  . URETHRAL DILATION  1965    FAMILY HISTORY: Family History  Problem Relation Age of Onset  . Heart failure Mother   . ALS Father   . Hyperlipidemia Sister   . Inflammatory bowel disease Paternal Grandfather     SOCIAL HISTORY:  Social History   Social History  . Marital status: Single    Spouse name: N/A  . Number of children: 0  . Years of education: 12th   Occupational History  .  Not Employed   Social History Main Topics  . Smoking status: Never Smoker  . Smokeless tobacco: Never Used  . Alcohol use Yes     Comment: very rare  . Drug use: No  . Sexual activity: No   Other Topics Concern  . Not on file   Social History Narrative   06/15/16 Patient lives alone in apartment.   Caffeine Use: none   Patient is both right and left handed.     PHYSICAL EXAM  Vitals:   06/15/16 0938  BP:  98/65  Pulse: 84  Weight: 190 lb (86.2 kg)  Height: 6' (1.829 m)    Not recorded      Body mass index is 25.77 kg/m.  GENERAL EXAM: Patient is in no distress  CARDIOVASCULAR: Regular rate and rhythm, no murmurs, no carotid bruits  NEUROLOGIC: MENTAL STATUS: awake, alert, language fluent, comprehension intact, naming intact; MASKED FACIES. HESTITANT STUTTERING SPEECH CRANIAL NERVE: no papilledema on fundoscopic exam, pupils equal and reactive to light, visual fields full to confrontation, extraocular muscles intact, no nystagmus, facial sensation and strength symmetric, uvula midline, shoulder shrug symmetric, tongue midline. MOTOR: INCREASED TONE IN LUE WITH RARE, FINE REST TREMOR. SIGNIFICANT BRADYKINESIA IN LUE. RUE AND BLE 5. LUE FINGER ABDUCTION AND EXTENSION 4.  SENSORY: normal and symmetric to light touch, temperature, vibration COORDINATION: finger-nose-finger, fine finger movements normal REFLEXES: RUE 3, LUE 2, KNEES 2, ANKLES 2 GAIT/STATION: narrow based gait; STOOPED POSTURE; SHORT STEPS; ABSENT ARM SWING.  DIAGNOSTIC DATA (LABS, IMAGING, TESTING) - I reviewed patient records, labs, notes, testing and imaging myself where available.  Lab Results  Component Value Date   WBC 5.4 06/03/2016   HGB 14.8 08/05/2014   HCT 39.2 06/03/2016   MCV 88 06/03/2016   PLT 191 06/03/2016      Component Value Date/Time   NA 139 06/03/2016 1224   K 3.9 06/03/2016 1224   CL 97 06/03/2016 1224   CO2 25 06/03/2016 1224   GLUCOSE 90 06/03/2016 1224   GLUCOSE 94 04/24/2013 0928   BUN 9 06/03/2016 1224   CREATININE 0.90 06/03/2016 1224   CREATININE 0.86 04/24/2013 0928   CALCIUM 9.7 06/03/2016 1224   PROT 6.7 06/03/2016 1224   ALBUMIN 4.7 06/03/2016 1224   AST 21 06/03/2016 1224   ALT 15 06/03/2016 1224   ALKPHOS 49 06/03/2016 1224   BILITOT 1.6 (H) 06/03/2016 1224   GFRNONAA 96 06/03/2016 1224   GFRNONAA >89 04/24/2013 0928   GFRAA 111 06/03/2016 1224   GFRAA >89  04/24/2013 0928   Lab Results  Component Value Date   CHOL 110 06/03/2016   HDL 50 06/03/2016   LDLCALC 50 06/03/2016   TRIG 48 06/03/2016   CHOLHDL 2.2 06/03/2016   Lab Results  Component Value Date   HGBA1C 4.8 11/08/2013   Lab Results  Component Value Date   GQQPYPPJ09 326 06/03/2016   Lab Results  Component Value Date   TSH 1.310 06/03/2016    11/29/12 MRI cervical  1. At C5-6: disc bulging with moderate spinal stenosis and severe biforaminal stenosis  2. At C3-4, C4-5, C6-7: disc bulging with mild spinal stenosis and severe biforaminal stenosis  3. No cord signal abnormalities.  11/29/12 MRI brain (without) demonstrating:  1. Single right frontal subcortical focus (29mm) of non-specific gliosis.  2. No acute findings.  04/12/13 CT myelogram cervical spine - Central disc herniation at C3-4 with moderate central stenosis. Severe left foraminal narrowing at this level. Moderate central stenosis at C5-6 and C6-7 secondary to posterior disc osteophytes.  06/05/15 MRI cervical spine [I reviewed images myself and agree with interpretation. -VRP]  1. At C3-4 there is a mild broad-based disc bulge. Left uncovertebral degenerative change and left facet arthropathy resulting in severe left foraminal stenosis. Mild right foraminal stenosis. Mild spinal stenosis. 2. Cervical spine spondylosis as described above.     ASSESSMENT AND PLAN  56 y.o. year old male here with gradual onset, progressive left arm weakness, slowness and stiffness with some numbness. On exam, patient has masked facies, rigidity in the left arm, bradykinesia in the left arm, stooped posture and poor arm swing.  Dx: parkinsonism (drug-induced parkinsonism vs idiopathic parkinson's disease) + mild cervical spinal stenosis and cervical radiculopathy  Drug-induced Parkinsonism ([place]) - Plan: AMB referral to nuclear medicine  Spinal stenosis in cervical region     PLAN: - check DATscan to eval for parkinson's  disease vs drug-induced parkinsonism - continue to use lowest possible dose of anti-psychotic medication (Dr. Letitia Caul; psychiatry) - in future, may consider low dose trial of carbidopa/levodopa to help with parkinsonism symptoms (slow movements, stiffness, tremor), but this could exacerbate schizophrenia symptoms - continue physical activity / exercises / PT - increase safety and supervision; caution with living alone and driving  Orders Placed This Encounter  Procedures  . AMB referral to nuclear medicine   Return in about 3 months (around 09/15/2016).    Penni Bombard, MD 10/01/4578, 99:83 AM Certified in Neurology, Neurophysiology and  Neuroimaging  [facility] [address] [place], [place] [address] [phone]

## 2016-06-17 ENCOUNTER — Telehealth: Payer: Self-pay | Admitting: Family Medicine

## 2016-06-17 DIAGNOSIS — G20 Parkinson's disease: Secondary | ICD-10-CM

## 2016-06-20 ENCOUNTER — Other Ambulatory Visit: Payer: Self-pay | Admitting: Family Medicine

## 2016-06-21 ENCOUNTER — Encounter: Payer: Self-pay | Admitting: Physical Therapy

## 2016-06-21 ENCOUNTER — Ambulatory Visit: Payer: PPO | Attending: Family Medicine | Admitting: Physical Therapy

## 2016-06-21 DIAGNOSIS — R262 Difficulty in walking, not elsewhere classified: Secondary | ICD-10-CM | POA: Diagnosis not present

## 2016-06-21 DIAGNOSIS — M25622 Stiffness of left elbow, not elsewhere classified: Secondary | ICD-10-CM | POA: Insufficient documentation

## 2016-06-21 DIAGNOSIS — M25612 Stiffness of left shoulder, not elsewhere classified: Secondary | ICD-10-CM | POA: Diagnosis not present

## 2016-06-21 NOTE — Therapy (Signed)
[facility] [place], [place], [address] Phone: [phone]   Fax:  [phone]  Physical Therapy Treatment  Patient Details  Name: Mark Benjamin MRN: [id number] Date of Birth: [birth year] Referring Provider: Redge Gainer MD.  Encounter Date: 06/21/2016      PT End of Session - 06/21/16 1351    Visit Number 2   Number of Visits 16   Date for PT Re-Evaluation 08/13/16   PT Start Time 1314   PT Stop Time 1356   PT Time Calculation (min) 42 min   Activity Tolerance Patient tolerated treatment well   Behavior During Therapy [facility] for tasks assessed/performed      Past Medical History:  Diagnosis Date  . Cataract   . Colon polyps   . Dysphagia   . Essential hypertension, benign   . Fatty liver   . Hyperplasia of prostate   . 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 Other and unspecified hyperlipidemia   . Schizophrenia [facility])     Past Surgical History:  Procedure Laterality Date  . COLONOSCOPY  02/13/08  . COLONOSCOPY N/A 07/10/2012   Procedure: COLONOSCOPY;  Surgeon: Rogene Houston, MD;  [facility];  Service: Endoscopy;  Laterality: N/A;  225  . UPPER GASTROINTESTINAL ENDOSCOPY  EGD ED   09/09/2010  . URETHRAL DILATION  1965    There were no vitals filed for this visit.      Subjective Assessment - 06/21/16 1316    Subjective Patient reported doing well after last treatment and liked the nustep for UE motion, patient reported no pain in arm just occasional wrist pain and stiffness with lack of movement   Pertinent History Parkinson.  Schizophrenia.   Patient Stated Goals Use left arm better.   Currently in Pain? No/denies                         [facility] Adult PT Treatment/Exercise - 06/21/16 0001      Exercises   Exercises Shoulder     Knee/Hip Exercises: Aerobic   Nustep 73mn L4 UE/LE activity, monitored for progression     Shoulder Exercises: Supine    Other Supine Exercises supine cane for flexion and ER 2x10     Shoulder Exercises: Pulleys   Flexion 2 minutes   Flexion Limitations difficulty with grip yet able to make small range   Other Pulley Exercises seated UE ranger for flexion and circles x654m     Shoulder Exercises: ROM/Strengthening   UBE (Upper Arm Bike) 120 RPM x 20m50m    Manual Therapy   Manual Therapy Passive ROM   Passive ROM manual PROM for right shoulder flexion and ER/IR with gentle range                PT Education - 06/21/16 1351    Education provided Yes   Education Details HEP   Person(s) Educated Patient   Methods Explanation;Demonstration;Handout   Comprehension Verbalized understanding;Returned demonstration          PT Short Term Goals - 06/21/16 1355      PT SHORT TERM GOAL #1   Title Independent with an initial HEP.   Time 4   Period Weeks   Status Achieved     PT SHORT TERM GOAL #2   Title Restore full left UE PROM.   Time 4   Period Weeks   Status On-going  PT Long Term Goals - 06/21/16 1356      PT LONG TERM GOAL #1   Title Independent with an advanced HEP.   Time 8   Period Weeks   Status On-going     PT LONG TERM GOAL #2   Title Active left shoulder flexion to 145 degrees so the patient can easily reach overhead   Time 8   Period Weeks   Status On-going     PT LONG TERM GOAL #3   Title Active ER to 70 degrees+ to allow for easily donning/doffing of apparel   Time 8   Period Weeks   Status On-going     PT LONG TERM GOAL #4   Title Increase left shoulder strength to a solid 4+/5 to increase stability for performance of functional activities   Time 8   Period Weeks   Status On-going     PT LONG TERM GOAL #5   Title Perform ADL's with no complaints.   Time 8   Period Weeks   Status On-going               Plan - 06/21/16 1356    Clinical Impression Statement Patient tolerated treatment well today yet had some reported fatigue in  left UE. Patient required a few rest breaks. Patient required cues to stay on task and some assist for help due to fatigue in arm. Patient was given HEP for supine cane exercises for AAROM for flexion and ER. Patient met STG #1 today, others ongoing due to strength and ROM deficts.    Rehab Potential Good   PT Frequency 2x / week   PT Duration 8 weeks   PT Treatment/Interventions ADLs/Self Care Home Management;Electrical Stimulation;Moist Heat;Ultrasound;Patient/family education;Therapeutic exercise;Therapeutic activities;Manual techniques;Passive range of motion;Vasopneumatic Device;Dry needling   PT Next Visit Plan Modalites as needed should pain be reported.  Nustep; left UE range of motion with progression to strengthening.  Please supervise patient at all times as he is at risk for falls.   Consulted and Agree with Plan of Care Patient      Patient will benefit from skilled therapeutic intervention in order to improve the following deficits and impairments:  Decreased activity tolerance, Decreased strength, Decreased range of motion, Impaired UE functional use, Impaired tone  Visit Diagnosis: Stiffness of left shoulder, not elsewhere classified  Stiffness of left elbow, not elsewhere classified     Problem List Patient Active Problem List   Diagnosis Date Noted  . Parkinson disease ([place]) 09/05/2015  . Vitamin D deficiency 09/05/2015  . Metabolic syndrome 93/71/6967  . Hypertension 04/02/2013  . Hyperlipidemia 04/02/2013  . BPH (benign prostatic hyperplasia) 04/02/2013  . Left arm weakness 11/21/2012  . Parkinsonism ([place]) 11/21/2012  . Neck pain 11/21/2012  . Colon adenomas 06/29/2012  . Rectal mass 06/29/2012  . Schizophrenia ([place]) 06/22/2012  . Dysphagia 06/22/2011  . Eosinophilic esophagitis 89/38/1017    Kaiyan Luczak P, PTA 06/21/2016, 2:00 PM  [facility] [place], [place], [address] Phone: [phone]    Fax:  [phone]  Name: Mark Benjamin MRN: [id number] Date of Birth: 14-Mar-1961

## 2016-06-21 NOTE — Patient Instructions (Signed)
ROM: External / Internal Rotation - Wand   Holding wand with left hand palm up, push out from body with other hand, palm down. Keep both elbows bent. When stretch is felt, hold _5___ seconds. Repeat to other side, leading with same hand. Keep elbows bent. Repeat __10__ times per set. Do __2-3__ sets per session. Do __2__ sessions per day.  http://orth.exer.us/748   Copyright  VHI. All rights reserved.  ROM: Flexion - Wand (Supine)   Lie on back holding wand. Raise arms over head.  Repeat __10__ times per set. Do _2-3___ sets per session. Do _2___ sessions per day.  http://orth.exer.us/928   Copyright  VHI. All rights reserved.   

## 2016-06-22 NOTE — Telephone Encounter (Signed)
Per Mark Benjamin- patient is using a shower chair that belonged to a family member currently and this is working well.  No longer needs shower chair prescription at this time.

## 2016-06-22 NOTE — Addendum Note (Signed)
Addended by: Denyce Robert on: 06/22/2016 02:19 PM   Modules accepted: Orders

## 2016-06-22 NOTE — Telephone Encounter (Signed)
Shower chair rx ready for pick up

## 2016-06-23 ENCOUNTER — Encounter: Payer: PPO | Admitting: Physical Therapy

## 2016-06-28 ENCOUNTER — Ambulatory Visit: Payer: PPO | Admitting: Physical Therapy

## 2016-06-28 ENCOUNTER — Encounter: Payer: Self-pay | Admitting: Physical Therapy

## 2016-06-28 DIAGNOSIS — M25612 Stiffness of left shoulder, not elsewhere classified: Secondary | ICD-10-CM | POA: Diagnosis not present

## 2016-06-28 DIAGNOSIS — M25622 Stiffness of left elbow, not elsewhere classified: Secondary | ICD-10-CM

## 2016-06-28 DIAGNOSIS — R262 Difficulty in walking, not elsewhere classified: Secondary | ICD-10-CM

## 2016-06-28 NOTE — Therapy (Signed)
[facility] [place], [place], [address] Phone: [phone]   Fax:  [phone]  Physical Therapy Treatment  Patient Details  Name: Mark Benjamin MRN: [id number] Date of Birth: 02/10/1961 Referring Provider: Redge Gainer, MD  Encounter Date: 06/28/2016      PT End of Session - 06/28/16 1359    Visit Number 3   Number of Visits 16   Date for PT Re-Evaluation 08/13/16   PT Start Time 9937   PT Stop Time 1350   PT Time Calculation (min) 45 min   Activity Tolerance Patient tolerated treatment well   Behavior During Therapy [facility] for tasks assessed/performed      Past Medical History:  Diagnosis Date  . Cataract   . Colon polyps   . Dysphagia   . Essential hypertension, benign   . Fatty liver   . Hyperplasia of prostate   . 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 Other and unspecified hyperlipidemia   . Schizophrenia [facility])     Past Surgical History:  Procedure Laterality Date  . COLONOSCOPY  02/13/08  . COLONOSCOPY N/A 07/10/2012   Procedure: COLONOSCOPY;  Surgeon: Rogene Houston, MD;  [facility];  Service: Endoscopy;  Laterality: N/A;  225  . UPPER GASTROINTESTINAL ENDOSCOPY  EGD ED   09/09/2010  . URETHRAL DILATION  1965    There were no vitals filed for this visit.      Subjective Assessment - 06/28/16 1325    Subjective Patient reporting, "I feel weak all over". Pt reporting soreness today in his left wrist.    Patient is accompained by: Family member   Pertinent History Parkinson.  Schizophrenia.   Patient Stated Goals Use left arm better.   Currently in Pain? Yes   Pain Score 2    Pain Location Wrist   Pain Orientation Left   Pain Descriptors / Indicators Sore   Pain Onset More than a month ago   Pain Frequency Intermittent   Aggravating Factors  using wrist   Effect of Pain on Daily Activities difficulty gripping objects, holding onto things             [facility] PT Assessment - 06/28/16 0001      Assessment   Medical Diagnosis left arm pain   Referring Provider Redge Gainer, MD   Hand Dominance Left     Precautions   Precautions Fall     Restrictions   Weight Bearing Restrictions No     Balance Screen   Has the patient fallen in the past 6 months No   Has the patient had a decrease in activity level because of a fear of falling?  Yes   Is the patient reluctant to leave their home because of a fear of falling?  Yes                     [facility] Adult PT Treatment/Exercise - 06/28/16 0001      Exercises   Exercises Shoulder     Knee/Hip Exercises: Aerobic   Nustep 31min L5 UE/LE activity, monitored for progression     Shoulder Exercises: Supine   Other Supine Exercises supine cane for flexion and ER 2x10     Shoulder Exercises: Seated   Other Seated Exercises UE ranger: clock wise, counter clockwise, flexion all x 1 minute   Other Seated Exercises sit to stand with UE push off, verbal cues required to elicit shoulder  extension and wrist extension x 5.      Shoulder Exercises: Pulleys   Flexion Other (comment)  4 minutes   Flexion Limitations Pt had to stop multiple times to establish grip on left UE   Other Pulley Exercises --     Shoulder Exercises: ROM/Strengthening   UBE (Upper Arm Bike) 120 RPM x 26min     Manual Therapy   Manual Therapy Passive ROM   Manual therapy comments Flexor tone noted in left wrist   Passive ROM manual PROM for right shoulder flexion and ER/IR with gentle range                PT Education - 06/28/16 1357    Education provided Yes   Education Details Instructed pt to perform his exercises on the bed. Also instructed pt to bring his UBE next visit for instructional use for UE and LE's. Instructed in wrist extension stretching and finger flexion.    Person(s) Educated Patient;Caregiver(s)   Methods Explanation;Demonstration;Verbal cues;Tactile cues   Comprehension  Verbalized understanding;Returned demonstration;Need further instruction;Verbal cues required          PT Short Term Goals - 06/21/16 1355      PT SHORT TERM GOAL #1   Title Independent with an initial HEP.   Time 4   Period Weeks   Status Achieved     PT SHORT TERM GOAL #2   Title Restore full left UE PROM.   Time 4   Period Weeks   Status On-going           PT Long Term Goals - 06/28/16 1408      PT LONG TERM GOAL #1   Title Independent with an advanced HEP.   Time 8   Period Weeks   Status On-going     PT LONG TERM GOAL #2   Title Active left shoulder flexion to 145 degrees so the patient can easily reach overhead   Time 8   Period Weeks   Status On-going     PT LONG TERM GOAL #3   Title Active ER to 70 degrees+ to allow for easily donning/doffing of apparel   Period Weeks   Status On-going     PT LONG TERM GOAL #4   Title Increase left shoulder strength to a solid 4+/5 to increase stability for performance of functional activities   Period Weeks   Status On-going     PT LONG TERM GOAL #5   Title Perform ADL's with no complaints.   Time 8   Period Weeks   Status On-going               Plan - 06/28/16 1400    Clinical Impression Statement Patient tolerated treatment well today. Flexor tone noted in pt's L wrist. Pt also with bilateral LE clonus L>R (Left=4 beat, right=2 beat). Pt with verbal instruction required for technique and postural correction throughout session. Pt reporting mild fatigue and required rest breaks thoughout session. Discussed HEP with pt's sister. The importance of movement was stressed to pt and his sister. Sister reported that she and her brother both had joined the [employer] and have strated to exercise. Skilled PT needed to continue to progress pt toward his goals set.    Rehab Potential Good   Clinical Impairments Affecting Rehab Potential Pt is a fall risk, shuffeling gait pattern   PT Frequency 2x / week   PT Duration 8  weeks   PT Treatment/Interventions ADLs/Self Care Home Management;Electrical Stimulation;Moist  Heat;Ultrasound;Patient/family education;Therapeutic exercise;Therapeutic activities;Manual techniques;Passive range of motion;Vasopneumatic Device;Dry needling   PT Next Visit Plan Modalites as needed should pain be reported.  Nustep; left UE range of motion with progression to strengthening.  Please supervise patient at all times as he is at risk for falls.   PT Home Exercise Plan wrist extension stretch, pt to bring UBE at next visit for further instructional use.   Consulted and Agree with Plan of Care Patient;Family member/caregiver   Family Member Consulted sister      Patient will benefit from skilled therapeutic intervention in order to improve the following deficits and impairments:  Decreased activity tolerance, Decreased strength, Decreased range of motion, Impaired UE functional use, Impaired tone  Visit Diagnosis: Stiffness of left shoulder, not elsewhere classified  Stiffness of left elbow, not elsewhere classified  Difficulty in walking, not elsewhere classified     Problem List Patient Active Problem List   Diagnosis Date Noted  . Parkinson disease ([place]) 09/05/2015  . Vitamin D deficiency 09/05/2015  . Metabolic syndrome 01/13/8526  . Hypertension 04/02/2013  . Hyperlipidemia 04/02/2013  . BPH (benign prostatic hyperplasia) 04/02/2013  . Left arm weakness 11/21/2012  . Parkinsonism ([place]) 11/21/2012  . Neck pain 11/21/2012  . Colon adenomas 06/29/2012  . Rectal mass 06/29/2012  . Schizophrenia ([place]) 06/22/2012  . Dysphagia 06/22/2011  . Eosinophilic esophagitis 78/24/2353    Oretha Caprice, MPT  06/28/2016, 2:14 PM  [facility] [place], [place], [address] Phone: [phone]   Fax:  [phone]  Name: HULEN MANDLER MRN: [id number] Date of Birth: [birth year]

## 2016-06-30 ENCOUNTER — Encounter: Payer: Self-pay | Admitting: Physical Therapy

## 2016-06-30 ENCOUNTER — Ambulatory Visit: Payer: PPO | Admitting: Physical Therapy

## 2016-06-30 DIAGNOSIS — R262 Difficulty in walking, not elsewhere classified: Secondary | ICD-10-CM

## 2016-06-30 DIAGNOSIS — M25622 Stiffness of left elbow, not elsewhere classified: Secondary | ICD-10-CM

## 2016-06-30 DIAGNOSIS — M25612 Stiffness of left shoulder, not elsewhere classified: Secondary | ICD-10-CM

## 2016-06-30 NOTE — Therapy (Signed)
[facility] [place], [place], [address] Phone: [phone]   Fax:  [phone]  Physical Therapy Treatment  Patient Details  Name: Mark Benjamin MRN: [id number] Date of Birth: 02-08-1961 Referring Provider: Redge Gainer, MD  Encounter Date: 06/30/2016      PT End of Session - 06/30/16 1300    Visit Number 4   Number of Visits 16   Date for PT Re-Evaluation 08/13/16   PT Start Time 6283   PT Stop Time 1345  2 units secondary to difficulty with shoes in clinic   PT Time Calculation (min) 41 min   Activity Tolerance Patient tolerated treatment well   Behavior During Therapy [facility] for tasks assessed/performed      Past Medical History:  Diagnosis Date  . Cataract   . Colon polyps   . Dysphagia   . Essential hypertension, benign   . Fatty liver   . Hyperplasia of prostate   . 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 Other and unspecified hyperlipidemia   . Schizophrenia [facility])     Past Surgical History:  Procedure Laterality Date  . COLONOSCOPY  02/13/08  . COLONOSCOPY N/A 07/10/2012   Procedure: COLONOSCOPY;  Surgeon: Rogene Houston, MD;  [facility];  Service: Endoscopy;  Laterality: N/A;  225  . UPPER GASTROINTESTINAL ENDOSCOPY  EGD ED   09/09/2010  . URETHRAL DILATION  1965    There were no vitals filed for this visit.      Subjective Assessment - 06/30/16 1300    Subjective Patient reports LUE stiffness and soreness today but "tolerable." Patient's sister asks that HEP AAROM ER be reviewed today as well chair bike for UE and LE use at home. Patient's sister brought in shoes that did not require shoe laces secondary to patient having difficulty with tying shoes and asked if they would be appropriate for him.   Patient is accompained by: Family member  Sister   Pertinent History Parkinson.  Schizophrenia.   Patient Stated Goals Use left arm better.   Currently in Pain?  Yes   Pain Score 4    Pain Location Arm   Pain Orientation Left   Pain Descriptors / Indicators Sore;Other (Comment)  Stiffness   Pain Onset More than a month ago            [facility] PT Assessment - 06/30/16 0001      Assessment   Medical Diagnosis left arm pain   Hand Dominance Left     Precautions   Precautions Fall     Restrictions   Weight Bearing Restrictions No                     [facility] Adult PT Treatment/Exercise - 06/30/16 0001      Knee/Hip Exercises: Aerobic   Nustep L7 x10 min for UE/LE activity     Shoulder Exercises: Supine   External Rotation AAROM;Left;20 reps   Flexion AAROM;Both;20 reps     Shoulder Exercises: Seated   Flexion AROM;Both;20 reps   Other Seated Exercises Seated UE cycle x4 min per patient's sister request to familiarize for home     Shoulder Exercises: Standing   Other Standing Exercises Standing LUE weightbearing with ball roll with RUE x15 reps     Shoulder Exercises: Pulleys   Flexion 3 minutes                  PT Short  Term Goals - 06/21/16 1355      PT SHORT TERM GOAL #1   Title Independent with an initial HEP.   Time 4   Period Weeks   Status Achieved     PT SHORT TERM GOAL #2   Title Restore full left UE PROM.   Time 4   Period Weeks   Status On-going           PT Long Term Goals - 06/28/16 1408      PT LONG TERM GOAL #1   Title Independent with an advanced HEP.   Time 8   Period Weeks   Status On-going     PT LONG TERM GOAL #2   Title Active left shoulder flexion to 145 degrees so the patient can easily reach overhead   Time 8   Period Weeks   Status On-going     PT LONG TERM GOAL #3   Title Active ER to 70 degrees+ to allow for easily donning/doffing of apparel   Period Weeks   Status On-going     PT LONG TERM GOAL #4   Title Increase left shoulder strength to a solid 4+/5 to increase stability for performance of functional activities   Period Weeks   Status On-going      PT LONG TERM GOAL #5   Title Perform ADL's with no complaints.   Time 8   Period Weeks   Status On-going               Plan - 06/30/16 1455    Clinical Impression Statement Patient tolerated today's treatment fairly well as he arrived with stiffness of L wrist and hand. Patient able to grip pulley better but required assist and VCs to increase ROM on pulley. Patient experienced weakness and fatigue during exercises per patient report. Patient educated with UE/LE cycle in sitting for both UE and LEs with patient denying any questions or confusion upon assessment. LUE weightbearing exercise attempted today with RUE activity but limited by limited L wrist ROM today. Patient completed AROM shoulder flexion fairly well as demo was completed by PTA along with patient to increase strength actively. Patient and patient's sister educated regarding AAROM ER technique in supine for proper completion and patient encouraged to use UE/LE cycle and to complete wrist extension stretch. Patient's sister educated that they could try the shoes without laces and if he was unable to wear them or they caused foot pain then to stop use.   Rehab Potential Good   Clinical Impairments Affecting Rehab Potential Pt is a fall risk, shuffeling gait pattern   PT Frequency 2x / week   PT Duration 8 weeks   PT Treatment/Interventions ADLs/Self Care Home Management;Electrical Stimulation;Moist Heat;Ultrasound;Patient/family education;Therapeutic exercise;Therapeutic activities;Manual techniques;Passive range of motion;Vasopneumatic Device;Dry needling   PT Next Visit Plan Modalites as needed should pain be reported.  Nustep; left UE range of motion with progression to strengthening.  Please supervise patient at all times as he is at risk for falls.   PT Home Exercise Plan wrist extension stretch, pt to bring UBE at next visit for further instructional use.   Consulted and Agree with Plan of Care Patient;Family  member/caregiver   Family Member Consulted sister      Patient will benefit from skilled therapeutic intervention in order to improve the following deficits and impairments:  Decreased activity tolerance, Decreased strength, Decreased range of motion, Impaired UE functional use, Impaired tone  Visit Diagnosis: Stiffness of left shoulder, not elsewhere classified  Stiffness of left elbow, not elsewhere classified  Difficulty in walking, not elsewhere classified     Problem List Patient Active Problem List   Diagnosis Date Noted  . Parkinson disease ([place]) 09/05/2015  . Vitamin D deficiency 09/05/2015  . Metabolic syndrome 75/44/9201  . Hypertension 04/02/2013  . Hyperlipidemia 04/02/2013  . BPH (benign prostatic hyperplasia) 04/02/2013  . Left arm weakness 11/21/2012  . Parkinsonism ([place]) 11/21/2012  . Neck pain 11/21/2012  . Colon adenomas 06/29/2012  . Rectal mass 06/29/2012  . Schizophrenia ([place]) 06/22/2012  . Dysphagia 06/22/2011  . Eosinophilic esophagitis 00/71/2197    Wynelle Fanny, PTA 06/30/2016, 3:01 PM  [facility] [address] [place], [place], [address] Phone: [phone]   Fax:  [phone]  Name: Mark Benjamin MRN: [id number] Date of Birth: 1961-02-08

## 2016-07-05 ENCOUNTER — Ambulatory Visit: Payer: PPO | Admitting: Physical Therapy

## 2016-07-05 DIAGNOSIS — M25622 Stiffness of left elbow, not elsewhere classified: Secondary | ICD-10-CM

## 2016-07-05 DIAGNOSIS — M25612 Stiffness of left shoulder, not elsewhere classified: Secondary | ICD-10-CM | POA: Diagnosis not present

## 2016-07-05 DIAGNOSIS — R262 Difficulty in walking, not elsewhere classified: Secondary | ICD-10-CM

## 2016-07-05 NOTE — Therapy (Signed)
[facility] [place], [place], [address] Phone: [phone]   Fax:  [phone]  Physical Therapy Treatment  Patient Details  Name: TAHJIR SILVERIA MRN: [id number] Date of Birth: 01/16/1961 Referring Provider: Redge Gainer, MD  Encounter Date: 07/05/2016      PT End of Session - 07/05/16 1318    Visit Number 5   Number of Visits 16   Date for PT Re-Evaluation 08/13/16   PT Start Time 0100   PT Stop Time 0151   PT Time Calculation (min) 51 min   Activity Tolerance Patient tolerated treatment well   Behavior During Therapy [facility] for tasks assessed/performed      Past Medical History:  Diagnosis Date  . Cataract   . Colon polyps   . Dysphagia   . Essential hypertension, benign   . Fatty liver   . Hyperplasia of prostate   . 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 Other and unspecified hyperlipidemia   . Schizophrenia [facility])     Past Surgical History:  Procedure Laterality Date  . COLONOSCOPY  02/13/08  . COLONOSCOPY N/A 07/10/2012   Procedure: COLONOSCOPY;  Surgeon: Rogene Houston, MD;  Location[facility];  Service: Endoscopy;  Laterality: N/A;  225  . UPPER GASTROINTESTINAL ENDOSCOPY  EGD ED   09/09/2010  . URETHRAL DILATION  1965    There were no vitals filed for this visit.      Subjective Assessment - 07/05/16 1312    Subjective The shoulder is hurting too bad but it is stiff.   Pain Score 4    Pain Location Arm   Pain Orientation Left   Pain Descriptors / Indicators Sore   Pain Onset More than a month ago                         [facility] Adult PT Treatment/Exercise - 07/05/16 0001      Exercises   Exercises Shoulder     Knee/Hip Exercises: Aerobic   Nustep level 3 x 13 minutes for UE ROM.     Shoulder Exercises: Pulleys   Flexion Limitations 5 minutes.     Modalities   Modalities Electrical Stimulation;Moist Heat     Moist Heat Therapy   Number  Minutes Moist Heat 15 Minutes   Moist Heat Location --  Left shoulder.     Acupuncturist Location Left shoulder.   Electrical Stimulation Action IFC   Electrical Stimulation Parameters 80-150 Hz at 80-150 Hz on 100% scan x 15 minutes.   Electrical Stimulation Goals Pain     Manual Therapy   Manual Therapy Passive ROM   Passive ROM PROM x 10 minutes to patient's left shoulder and elbow.                    PT Short Term Goals - 06/21/16 1355      PT SHORT TERM GOAL #1   Title Independent with an initial HEP.   Time 4   Period Weeks   Status Achieved     PT SHORT TERM GOAL #2   Title Restore full left UE PROM.   Time 4   Period Weeks   Status On-going           PT Long Term Goals - 06/28/16 1408      PT LONG TERM GOAL #1   Title Independent with an advanced  HEP.   Time 8   Period Weeks   Status On-going     PT LONG TERM GOAL #2   Title Active left shoulder flexion to 145 degrees so the patient can easily reach overhead   Time 8   Period Weeks   Status On-going     PT LONG TERM GOAL #3   Title Active ER to 70 degrees+ to allow for easily donning/doffing of apparel   Period Weeks   Status On-going     PT LONG TERM GOAL #4   Title Increase left shoulder strength to a solid 4+/5 to increase stability for performance of functional activities   Period Weeks   Status On-going     PT LONG TERM GOAL #5   Title Perform ADL's with no complaints.   Time 8   Period Weeks   Status On-going               Plan - 07/05/16 1340    Clinical Impression Statement Patient did well today.  He needs continued one to one left UE PROM.      Patient will benefit from skilled therapeutic intervention in order to improve the following deficits and impairments:     Visit Diagnosis: Stiffness of left shoulder, not elsewhere classified  Stiffness of left elbow, not elsewhere classified  Difficulty in walking, not elsewhere  classified     Problem List Patient Active Problem List   Diagnosis Date Noted  . Parkinson disease ([place]) 09/05/2015  . Vitamin D deficiency 09/05/2015  . Metabolic syndrome 37/94/3276  . Hypertension 04/02/2013  . Hyperlipidemia 04/02/2013  . BPH (benign prostatic hyperplasia) 04/02/2013  . Left arm weakness 11/21/2012  . Parkinsonism ([place]) 11/21/2012  . Neck pain 11/21/2012  . Colon adenomas 06/29/2012  . Rectal mass 06/29/2012  . Schizophrenia ([place]) 06/22/2012  . Dysphagia 06/22/2011  . Eosinophilic esophagitis 14/70/9295    [place], [place] 07/05/2016, 2:01 PM  [facility] [address] [place], [place], [address] Phone: [phone]   Fax:  [phone]  Name: BRAETON WOLGAMOTT MRN: [id number] Date of Birth: Sep 21, 1960

## 2016-07-07 ENCOUNTER — Encounter: Payer: Self-pay | Admitting: Physical Therapy

## 2016-07-07 ENCOUNTER — Ambulatory Visit: Payer: PPO | Admitting: Physical Therapy

## 2016-07-07 DIAGNOSIS — R262 Difficulty in walking, not elsewhere classified: Secondary | ICD-10-CM

## 2016-07-07 DIAGNOSIS — M25622 Stiffness of left elbow, not elsewhere classified: Secondary | ICD-10-CM

## 2016-07-07 DIAGNOSIS — M25612 Stiffness of left shoulder, not elsewhere classified: Secondary | ICD-10-CM | POA: Diagnosis not present

## 2016-07-07 NOTE — Patient Instructions (Signed)
  Pull wrist and fingers backward until stretch is felt. Hold 20 seconds, repeat 5 times     Place hands on table and lean forward, hold stretch for 10 seconds, repeat 10 times    Wall Push Ups: stand with feet shoulder feet apart, place hands shoulder width apart on the wall and lean forward, hold stretch 5 seconds, repeat 10 -15 times.

## 2016-07-07 NOTE — Therapy (Signed)
[facility] [place], [place], [address] Phone: [phone]   Fax:  [phone]  Physical Therapy Treatment  Patient Details  Name: Mark Benjamin MRN: [id number] Date of Birth: 09-25-1960 Referring Provider: Redge Gainer, MD  Encounter Date: 07/07/2016      PT End of Session - 07/07/16 1401    Visit Number 6   Number of Visits 16   Date for PT Re-Evaluation 08/13/16   PT Start Time 1300   PT Stop Time 1350   PT Time Calculation (min) 50 min   Activity Tolerance Patient tolerated treatment well   Behavior During Therapy [facility] for tasks assessed/performed      Past Medical History:  Diagnosis Date  . Cataract   . Colon polyps   . Dysphagia   . Essential hypertension, benign   . Fatty liver   . Hyperplasia of prostate   . 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 Other and unspecified hyperlipidemia   . Schizophrenia [facility])     Past Surgical History:  Procedure Laterality Date  . COLONOSCOPY  02/13/08  . COLONOSCOPY N/A 07/10/2012   Procedure: COLONOSCOPY;  Surgeon: Rogene Houston, MD;  [facility];  Service: Endoscopy;  Laterality: N/A;  225  . UPPER GASTROINTESTINAL ENDOSCOPY  EGD ED   09/09/2010  . URETHRAL DILATION  1965    There were no vitals filed for this visit.      Subjective Assessment - 07/07/16 1414    Subjective Pt arriving to therapy with his sister who reports she wants her brother to concentrate more on his Left UE. No complaints of pain today from the pt. Pt reporting that he went this morning to the [facility] and worked on the bike.    Patient is accompained by: Family member   Pertinent History Parkinson.  Schizophrenia.   Limitations Walking;Reading;Writing;House hold activities;Lifting   Patient Stated Goals Use left arm better.   Currently in Pain? No/denies            [facility] PT Assessment - 07/07/16 0001      Assessment   Medical Diagnosis left arm  pain   Hand Dominance Left     Precautions   Precautions Fall     Restrictions   Weight Bearing Restrictions No                     [facility] Adult PT Treatment/Exercise - 07/07/16 0001      Exercises   Exercises Shoulder     Knee/Hip Exercises: Aerobic   Nustep level 5 x 6 minutes     Shoulder Exercises: Seated   Row AROM;15 reps;Theraband   Theraband Level (Shoulder Row) Level 1 (Yellow)   Flexion AROM;15 reps;Theraband   Theraband Level (Shoulder Flexion) Level 1 (Yellow)     Shoulder Exercises: ROM/Strengthening   UBE (Upper Arm Bike) 120 RPM x 4 minutes   Wall Pushups 5 reps   Wall Pushups Limitations pt required demonstration and verbal and tactile cues for technique   Other ROM/Strengthening Exercises weight bearing on table with wrist and fingers extended., holding 20 seconds each     Manual Therapy   Manual Therapy Passive ROM   Passive ROM PROM to left shoulder, elbow, wrist/hand, and fingers in all planes for tone                PT Education - 07/07/16 1400    Education Details HEP  Person(s) Educated Patient;Caregiver(s)   Methods Explanation;Demonstration;Verbal cues;Tactile cues;Handout   Comprehension Verbalized understanding;Returned demonstration;Verbal cues required;Need further instruction          PT Short Term Goals - 06/21/16 1355      PT SHORT TERM GOAL #1   Title Independent with an initial HEP.   Time 4   Period Weeks   Status Achieved     PT SHORT TERM GOAL #2   Title Restore full left UE PROM.   Time 4   Period Weeks   Status On-going           PT Long Term Goals - 07/07/16 1405      PT LONG TERM GOAL #1   Title Independent with an advanced HEP.   Time 8   Period Weeks   Status On-going     PT LONG TERM GOAL #2   Title Active left shoulder flexion to 145 degrees so the patient can easily reach overhead   Time 8   Period Weeks   Status On-going     PT LONG TERM GOAL #3   Title Active ER to 70  degrees+ to allow for easily donning/doffing of apparel   Time 8   Period Weeks   Status On-going     PT LONG TERM GOAL #4   Title Increase left shoulder strength to a solid 4+/5 to increase stability for performance of functional activities   Time 8   Period Weeks   Status On-going     PT LONG TERM GOAL #5   Title Perform ADL's with no complaints.   Period Weeks   Status On-going               Plan - 07/07/16 1402    Clinical Impression Statement Pt tolerated exercises well today. Concentrating on Left UE ROM and exercises. No pain reported. Pt requiring increased time for all movements and response to exercise techniques. Pt was issued a HEP for UE stretching both in [facility] and [facility]. Pt's sister was also included in pt's edu at end of session. Continue with skilled PT to progress pt's UE function and overall safety with gait and functional mobility.    Rehab Potential Good   Clinical Impairments Affecting Rehab Potential Pt is a fall risk, shuffeling gait pattern   PT Frequency 2x / week   PT Duration 8 weeks   PT Treatment/Interventions ADLs/Self Care Home Management;Electrical Stimulation;Moist Heat;Ultrasound;Patient/family education;Therapeutic exercise;Therapeutic activities;Manual techniques;Passive range of motion;Vasopneumatic Device;Dry needling   PT Next Visit Plan Modalites as needed should pain be reported.  Nustep; left UE range of motion with progression to strengthening.  Please supervise patient at all times as he is at risk for falls.   PT Home Exercise Plan wrist extension stretch, pt to bring UBE at next visit for further instructional use, wall push ups, wrist/finger extension stretching   Consulted and Agree with Plan of Care Patient;Family member/caregiver   Family Member Consulted sister      Patient will benefit from skilled therapeutic intervention in order to improve the following deficits and impairments:  Decreased activity tolerance, Decreased  strength, Decreased range of motion, Impaired UE functional use, Impaired tone  Visit Diagnosis: Stiffness of left shoulder, not elsewhere classified  Stiffness of left elbow, not elsewhere classified  Difficulty in walking, not elsewhere classified     Problem List Patient Active Problem List   Diagnosis Date Noted  . Parkinson disease ([place]) 09/05/2015  . Vitamin D deficiency 09/05/2015  .  Metabolic syndrome 57/03/7791  . Hypertension 04/02/2013  . Hyperlipidemia 04/02/2013  . BPH (benign prostatic hyperplasia) 04/02/2013  . Left arm weakness 11/21/2012  . Parkinsonism ([place]) 11/21/2012  . Neck pain 11/21/2012  . Colon adenomas 06/29/2012  . Rectal mass 06/29/2012  . Schizophrenia ([place]) 06/22/2012  . Dysphagia 06/22/2011  . Eosinophilic esophagitis 90/30/0923    Oretha Caprice, MPT 07/07/2016, 2:16 PM  [facility] [place], [place], [address] Phone: [phone]   Fax:  [phone]  Name: Mark Benjamin MRN: [id number] Date of Birth: 12/12/1960

## 2016-07-12 ENCOUNTER — Ambulatory Visit: Payer: PPO | Admitting: Physical Therapy

## 2016-07-12 ENCOUNTER — Encounter: Payer: Self-pay | Admitting: Physical Therapy

## 2016-07-12 DIAGNOSIS — M25622 Stiffness of left elbow, not elsewhere classified: Secondary | ICD-10-CM

## 2016-07-12 DIAGNOSIS — M25612 Stiffness of left shoulder, not elsewhere classified: Secondary | ICD-10-CM

## 2016-07-12 DIAGNOSIS — R262 Difficulty in walking, not elsewhere classified: Secondary | ICD-10-CM

## 2016-07-12 NOTE — Therapy (Signed)
[facility] [place], [place], [address] Phone: [phone]   Fax:  [phone]  Physical Therapy Treatment  Patient Details  Name: Mark Benjamin MRN: [id number] Date of Birth: 03-Dec-1960 Referring Provider: Redge Gainer, MD  Encounter Date: 07/12/2016      PT End of Session - 07/12/16 1310    Visit Number 7   Number of Visits 16   Date for PT Re-Evaluation 08/13/16   PT Start Time 1300   PT Stop Time 1345   PT Time Calculation (min) 45 min   Activity Tolerance Patient tolerated treatment well   Behavior During Therapy [facility] for tasks assessed/performed      Past Medical History:  Diagnosis Date  . Cataract   . Colon polyps   . Dysphagia   . Essential hypertension, benign   . Fatty liver   . Hyperplasia of prostate   . 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 Other and unspecified hyperlipidemia   . Schizophrenia [facility])     Past Surgical History:  Procedure Laterality Date  . COLONOSCOPY  02/13/08  . COLONOSCOPY N/A 07/10/2012   Procedure: COLONOSCOPY;  Surgeon: Rogene Houston, MD;  [facility];  Service: Endoscopy;  Laterality: N/A;  225  . UPPER GASTROINTESTINAL ENDOSCOPY  EGD ED   09/09/2010  . URETHRAL DILATION  1965    There were no vitals filed for this visit.      Subjective Assessment - 07/12/16 1308    Subjective Patient arriving to therapy today reporting weakness in his legs and Left arm. No reports of pain.    Pertinent History Parkinson.  Schizophrenia.   Limitations Walking;Reading;Writing;House hold activities;Lifting   Patient Stated Goals Use left arm better.   Currently in Pain? No/denies   Aggravating Factors  using wrist   Effect of Pain on Daily Activities difficulty gripping objects and holding things            [facility] PT Assessment - 07/12/16 0001      Assessment   Medical Diagnosis left arm pain   Hand Dominance Left     Precautions   Precautions Fall     Restrictions   Weight Bearing Restrictions No                     [facility] Adult PT Treatment/Exercise - 07/12/16 0001      Exercises   Exercises Shoulder     Knee/Hip Exercises: Aerobic   Nustep level 5 x 10 minutes     Shoulder Exercises: Seated   Other Seated Exercises Using Yellow theraputty: making balls, twisting, rolling the putty flat. Pt needed verbal cues throughout to keep moving.    Other Seated Exercises Using weighted hammers for Left UE pronation and supination x 20 reps     Shoulder Exercises: ROM/Strengthening   UBE (Upper Arm Bike) 120 RPM x 4 minutes   Wall Pushups 10 reps   Wall Pushups Limitations pt required demonstration and verbal and tactile cues for technique   Ball on Wall flexion: x 5 reps   Other ROM/Strengthening Exercises weight bearing on table with wrist and fingers extended., holding 20 seconds each     Manual Therapy   Manual Therapy Passive ROM   Passive ROM PROM to left shoulder, elbow, wrist/hand, and fingers in all planes for tone                PT Education - 07/12/16  1310    Education provided Yes   Education Details reviewed wrist and hand stretches   Person(s) Educated Patient   Methods Explanation   Comprehension Verbalized understanding          PT Short Term Goals - 06/21/16 1355      PT SHORT TERM GOAL #1   Title Independent with an initial HEP.   Time 4   Period Weeks   Status Achieved     PT SHORT TERM GOAL #2   Title Restore full left UE PROM.   Time 4   Period Weeks   Status On-going           PT Long Term Goals - 07/12/16 1324      PT LONG TERM GOAL #1   Title Independent with an advanced HEP.   Time 8   Period Weeks   Status On-going     PT LONG TERM GOAL #2   Title Active left shoulder flexion to 145 degrees so the patient can easily reach overhead   Time 8   Status On-going     PT LONG TERM GOAL #3   Title Active ER to 70 degrees+ to allow for easily  donning/doffing of apparel   Status On-going     PT LONG TERM GOAL #4   Title Increase left shoulder strength to a solid 4+/5 to increase stability for performance of functional activities   Period Weeks   Status On-going     PT LONG TERM GOAL #5   Time 8   Period Weeks   Status On-going               Plan - 07/12/16 1312    Clinical Impression Statement Patient tolerated exercises well today. Pt requiring increased time for all activities and repeated verbal instructions for technique and form. PROM and weight bearing exercises performed on Left UE. Continue to progress pt's functional mobility toward goals set.    Rehab Potential Good   Clinical Impairments Affecting Rehab Potential Pt is a fall risk, shuffeling gait pattern   PT Frequency 2x / week   PT Duration 8 weeks   PT Treatment/Interventions ADLs/Self Care Home Management;Electrical Stimulation;Moist Heat;Ultrasound;Patient/family education;Therapeutic exercise;Therapeutic activities;Manual techniques;Passive range of motion;Vasopneumatic Device;Dry needling   PT Next Visit Plan Modalites as needed should pain be reported.  Nustep; left UE range of motion with progression to strengthening.  Please supervise patient at all times as he is at risk for falls.   PT Home Exercise Plan wrist extension stretch, pt to bring UBE at next visit for further instructional use, wall push ups, wrist/finger extension stretching   Consulted and Agree with Plan of Care Patient   Family Member Consulted sister      Patient will benefit from skilled therapeutic intervention in order to improve the following deficits and impairments:  Decreased activity tolerance, Decreased strength, Decreased range of motion, Impaired UE functional use, Impaired tone  Visit Diagnosis: Stiffness of left shoulder, not elsewhere classified  Stiffness of left elbow, not elsewhere classified  Difficulty in walking, not elsewhere classified     Problem  List Patient Active Problem List   Diagnosis Date Noted  . Parkinson disease ([place]) 09/05/2015  . Vitamin D deficiency 09/05/2015  . Metabolic syndrome 16/12/9602  . Hypertension 04/02/2013  . Hyperlipidemia 04/02/2013  . BPH (benign prostatic hyperplasia) 04/02/2013  . Left arm weakness 11/21/2012  . Parkinsonism ([place]) 11/21/2012  . Neck pain 11/21/2012  . Colon adenomas 06/29/2012  .  Rectal mass 06/29/2012  . Schizophrenia ([place]) 06/22/2012  . Dysphagia 06/22/2011  . Eosinophilic esophagitis 03/30/3233    Oretha Caprice, MPT  07/12/2016, 1:44 PM  [facility] [address] [place], [place], [address] Phone: [phone]   Fax:  [phone]  Name: Mark Benjamin MRN: [id number] Date of Birth: 04-10-1960

## 2016-07-14 ENCOUNTER — Encounter: Payer: Self-pay | Admitting: Physical Therapy

## 2016-07-14 ENCOUNTER — Ambulatory Visit: Payer: PPO | Admitting: Physical Therapy

## 2016-07-14 DIAGNOSIS — M25612 Stiffness of left shoulder, not elsewhere classified: Secondary | ICD-10-CM | POA: Diagnosis not present

## 2016-07-14 DIAGNOSIS — R262 Difficulty in walking, not elsewhere classified: Secondary | ICD-10-CM

## 2016-07-14 DIAGNOSIS — M25622 Stiffness of left elbow, not elsewhere classified: Secondary | ICD-10-CM

## 2016-07-14 NOTE — Therapy (Signed)
[facility] [place], [place], [address] Phone: [phone]   Fax:  [phone]  Physical Therapy Treatment  Patient Details  Name: Mark [facility] MRN: [id number] Date of Birth: [birth year] Referring Provider: Redge Gainer, MD  Encounter Date: 07/14/2016      PT End of Session - 07/14/16 1356    Visit Number 8   Number of Visits 16   Date for PT Re-Evaluation 08/13/16   PT Start Time 1300   PT Stop Time 1345   PT Time Calculation (min) 45 min   Activity Tolerance Patient tolerated treatment well   Behavior During Therapy [facility] for tasks assessed/performed      Past Medical History:  Diagnosis Date  . Cataract   . Colon polyps   . Dysphagia   . Essential hypertension, benign   . Fatty liver   . Hyperplasia of prostate   . 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 Other and unspecified hyperlipidemia   . Schizophrenia [facility])     Past Surgical History:  Procedure Laterality Date  . COLONOSCOPY  02/13/08  . COLONOSCOPY N/A 07/10/2012   Procedure: COLONOSCOPY;  Surgeon: Rogene Houston, MD;  Location[facility];  Service: Endoscopy;  Laterality: N/A;  225  . UPPER GASTROINTESTINAL ENDOSCOPY  EGD ED   09/09/2010  . URETHRAL DILATION  1965    There were no vitals filed for this visit.      Subjective Assessment - 07/14/16 1354    Subjective Patient arriving to therapy today reporting no pain only weakness in his left arm. no new falls reported.    Pertinent History Parkinson.  Schizophrenia.   Limitations Walking;Reading;Writing;House hold activities;Lifting   Patient Stated Goals Use left arm better.   Currently in Pain? No/denies            [name] PT Assessment - 07/14/16 0001      Assessment   Medical Diagnosis left arm pain   Hand Dominance Left     Precautions   Precautions Fall   Precaution Comments No new falls reported since last visit     Restrictions   Weight  Bearing Restrictions No                     [facility] Adult PT Treatment/Exercise - 07/14/16 0001      Exercises   Exercises Shoulder     Shoulder Exercises: Seated   Other Seated Exercises UE ranger: circles both directions x 30 with assistnace to maintain movement and constant verbal cues, shoulder abduction x 20  with verbal cues for full ROM and continued motion.      Shoulder Exercises: Standing   Other Standing Exercises Standing UE Ranger: x 8 reps with assist to elicit full L shoulder flexion. Pt needed verbal and tactile cues for continued motion   Other Standing Exercises 4# therapy ball using bilateral UE's to raise up and down the wall x 3 reps. pt required increased time for the activity due to stopping in mid task. Pt needed constant verbal instructions and tactile cures. Metronome technique tried with tapping the wall while pt was trying to perform the task. Pt still having difficulty completing the task without verbal intervention.      Shoulder Exercises: ROM/Strengthening   UBE (Upper Arm Bike) 120 RPM x 5 minutes   Wall Pushups 10 reps   Wall Pushups Limitations pt required demonstration and verbal and tactile cues for technique  PT Education - 07/14/16 1355    Education provided Yes   Education Details reviewed wall push ups, using a ball to work on recripical movement patterns with bilateral UE's for shoulder flexion, circles both directions and abduction   Person(s) Educated Associate Professor)  sister          PT Short Term Goals - 06/21/16 1355      PT SHORT TERM GOAL #1   Title Independent with an initial HEP.   Time 4   Period Weeks   Status Achieved     PT SHORT TERM GOAL #2   Title Restore full left UE PROM.   Time 4   Period Weeks   Status On-going           PT Long Term Goals - 07/14/16 1359      PT LONG TERM GOAL #1   Title Independent with an advanced HEP.   Period Weeks   Status On-going     PT  LONG TERM GOAL #2   Title Active left shoulder flexion to 145 degrees so the patient can easily reach overhead   Period Weeks   Status On-going     PT LONG TERM GOAL #3   Title Active ER to 70 degrees+ to allow for easily donning/doffing of apparel   Time 8   Period Weeks     PT LONG TERM GOAL #4   Title Increase left shoulder strength to a solid 4+/5 to increase stability for performance of functional activities   Time 8   Period Weeks   Status On-going     PT LONG TERM GOAL #5   Title Perform ADL's with no complaints.   Period Weeks   Status On-going               Plan - 07/14/16 1357    Clinical Impression Statement Patient tolerated exercises well today. Pt worked on left UE ROM and strength. Pt's sister edu at end of session about ball exercises and improvements in left wrist AAROM. Continue to progress pt's functional mobility with the below interventions.   Rehab Potential Good   Clinical Impairments Affecting Rehab Potential Pt is a fall risk, shuffeling gait pattern   PT Frequency 2x / week   PT Duration 8 weeks   PT Treatment/Interventions ADLs/Self Care Home Management;Electrical Stimulation;Moist Heat;Ultrasound;Patient/family education;Therapeutic exercise;Therapeutic activities;Manual techniques;Passive range of motion;Vasopneumatic Device;Dry needling   PT Next Visit Plan Modalites as needed should pain be reported.  Nustep; left UE range of motion with progression to strengthening.  Please supervise patient at all times as he is at risk for falls.   PT Home Exercise Plan wrist extension stretch, pt to bring UBE at next visit for further instructional use, wall push ups, wrist/finger extension stretching, walking the ball up the wall, rolling ball across the table with bilateral UE's   Consulted and Agree with Plan of Care Patient      Patient will benefit from skilled therapeutic intervention in order to improve the following deficits and impairments:   Decreased activity tolerance, Decreased strength, Decreased range of motion, Impaired UE functional use, Impaired tone  Visit Diagnosis: Stiffness of left shoulder, not elsewhere classified  Stiffness of left elbow, not elsewhere classified  Difficulty in walking, not elsewhere classified     Problem List Patient Active Problem List   Diagnosis Date Noted  . Parkinson disease ([place]) 09/05/2015  . Vitamin D deficiency 09/05/2015  . Metabolic syndrome 95/11/3265  . Hypertension 04/02/2013  .  Hyperlipidemia 04/02/2013  . BPH (benign prostatic hyperplasia) 04/02/2013  . Left arm weakness 11/21/2012  . Parkinsonism ([place]) 11/21/2012  . Neck pain 11/21/2012  . Colon adenomas 06/29/2012  . Rectal mass 06/29/2012  . Schizophrenia ([place]) 06/22/2012  . Dysphagia 06/22/2011  . Eosinophilic esophagitis 95/32/0233    Mark Benjamin, Mark Benjamin  07/14/2016, 2:09 PM  [facility] [place], [place], [address] Phone: [phone]   Fax:  [phone]  Name: Mark Benjamin MRN: [id number] Date of Birth: 1960/04/27

## 2016-07-19 ENCOUNTER — Ambulatory Visit: Payer: PPO | Admitting: Physical Therapy

## 2016-07-19 DIAGNOSIS — R262 Difficulty in walking, not elsewhere classified: Secondary | ICD-10-CM

## 2016-07-19 DIAGNOSIS — M25612 Stiffness of left shoulder, not elsewhere classified: Secondary | ICD-10-CM | POA: Diagnosis not present

## 2016-07-19 DIAGNOSIS — M25622 Stiffness of left elbow, not elsewhere classified: Secondary | ICD-10-CM

## 2016-07-19 NOTE — Therapy (Signed)
[facility] [place], [place], [address] Phone: [phone]   Fax:  [phone]  Physical Therapy Treatment  Patient Details  Name: Mark Benjamin MRN: [id number] Date of Birth: Oct 16, 1960 Referring Provider: Redge Gainer, MD  Encounter Date: 07/19/2016      PT End of Session - 07/19/16 1320    Visit Number 9   Number of Visits 16   Date for PT Re-Evaluation 08/13/16   PT Start Time 0100      Past Medical History:  Diagnosis Date  . Cataract   . Colon polyps   . Dysphagia   . Essential hypertension, benign   . Fatty liver   . Hyperplasia of prostate   . 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 Other and unspecified hyperlipidemia   . Schizophrenia [facility])     Past Surgical History:  Procedure Laterality Date  . COLONOSCOPY  02/13/08  . COLONOSCOPY N/A 07/10/2012   Procedure: COLONOSCOPY;  Surgeon: Rogene Houston, MD;  [facility];  Service: Endoscopy;  Laterality: N/A;  225  . UPPER GASTROINTESTINAL ENDOSCOPY  EGD ED   09/09/2010  . URETHRAL DILATION  1965    There were no vitals filed for this visit.      Subjective Assessment - 07/19/16 1320    Subjective [place]  My left arm feels weak.   Pain Score 4    Pain Orientation Left   Pain Onset More than a month ago                         [facility] Adult PT Treatment/Exercise - 07/19/16 0001      Exercises   Exercises Shoulder;Knee/Hip     Knee/Hip Exercises: Aerobic   Nustep Level 3 x 15 minutes with monitoring to achieve desired cadence over 15 minutes for UE rhythmical motion.     Shoulder Exercises: ROM/Strengthening   UBE (Upper Arm Bike) 120 RPM's x 10 minutes moinitored and multiple assist to "re-start" patient.     Manual Therapy   Manual Therapy Passive ROM   Passive ROM In supine:  Performed ROM in PNF patterns with endrange stretching (D1/D2) and rhy stabs x 23 minutes.                   PT Short Term Goals - 06/21/16 1355      PT SHORT TERM GOAL #1   Title Independent with an initial HEP.   Time 4   Period Weeks   Status Achieved     PT SHORT TERM GOAL #2   Title Restore full left UE PROM.   Time 4   Period Weeks   Status On-going           PT Long Term Goals - 07/14/16 1359      PT LONG TERM GOAL #1   Title Independent with an advanced HEP.   Period Weeks   Status On-going     PT LONG TERM GOAL #2   Title Active left shoulder flexion to 145 degrees so the patient can easily reach overhead   Period Weeks   Status On-going     PT LONG TERM GOAL #3   Title Active ER to 70 degrees+ to allow for easily donning/doffing of apparel   Time 8   Period Weeks     PT LONG TERM GOAL #4   Title Increase left  shoulder strength to a solid 4+/5 to increase stability for performance of functional activities   Time 8   Period Weeks   Status On-going     PT LONG TERM GOAL #5   Title Perform ADL's with no complaints.   Period Weeks   Status On-going               Plan - 07/19/16 1348    Clinical Impression Statement patient did well with PNF/strecthing techniques today.      Patient will benefit from skilled therapeutic intervention in order to improve the following deficits and impairments:  Decreased activity tolerance, Decreased strength, Decreased range of motion, Impaired UE functional use, Impaired tone  Visit Diagnosis: Stiffness of left shoulder, not elsewhere classified  Stiffness of left elbow, not elsewhere classified  Difficulty in walking, not elsewhere classified     Problem List Patient Active Problem List   Diagnosis Date Noted  . Parkinson disease ([place]) 09/05/2015  . Vitamin D deficiency 09/05/2015  . Metabolic syndrome 58/25/1898  . Hypertension 04/02/2013  . Hyperlipidemia 04/02/2013  . BPH (benign prostatic hyperplasia) 04/02/2013  . Left arm weakness 11/21/2012  . Parkinsonism ([place])  11/21/2012  . Neck pain 11/21/2012  . Colon adenomas 06/29/2012  . Rectal mass 06/29/2012  . Schizophrenia ([place]) 06/22/2012  . Dysphagia 06/22/2011  . Eosinophilic esophagitis 42/12/3126    Iley Deignan, [place]  MPT 07/19/2016, 1:53 PM  [facility] [address] [place], [place], [address] Phone: [phone]   Fax:  [phone]  Name: Mark Benjamin MRN: [id number] Date of Birth: 08/24/1960

## 2016-07-21 ENCOUNTER — Ambulatory Visit: Payer: PPO | Attending: Family Medicine | Admitting: Physical Therapy

## 2016-07-21 DIAGNOSIS — R262 Difficulty in walking, not elsewhere classified: Secondary | ICD-10-CM | POA: Diagnosis not present

## 2016-07-21 DIAGNOSIS — M25612 Stiffness of left shoulder, not elsewhere classified: Secondary | ICD-10-CM | POA: Diagnosis not present

## 2016-07-21 DIAGNOSIS — M25622 Stiffness of left elbow, not elsewhere classified: Secondary | ICD-10-CM | POA: Diagnosis not present

## 2016-07-21 NOTE — Therapy (Signed)
[facility] [place], [place], [address] Phone: [phone]   Fax:  [phone]  Physical Therapy Treatment  Patient Details  Name: Mark Benjamin MRN: [id number] Date of Birth: 08/10/1960 Referring Provider: Redge Gainer, MD  Encounter Date: 07/21/2016      PT End of Session - 07/21/16 1405    Visit Number 10   Number of Visits 16   Date for PT Re-Evaluation 08/13/16   PT Start Time 0100   PT Stop Time 0145   PT Time Calculation (min) 45 min      Past Medical History:  Diagnosis Date  . Cataract   . Colon polyps   . Dysphagia   . Essential hypertension, benign   . Fatty liver   . Hyperplasia of prostate   . 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 Other and unspecified hyperlipidemia   . Schizophrenia [facility])     Past Surgical History:  Procedure Laterality Date  . COLONOSCOPY  02/13/08  . COLONOSCOPY N/A 07/10/2012   Procedure: COLONOSCOPY;  Surgeon: Rogene Houston, MD;  Location[facility];  Service: Endoscopy;  Laterality: N/A;  225  . UPPER GASTROINTESTINAL ENDOSCOPY  EGD ED   09/09/2010  . URETHRAL DILATION  1965    There were no vitals filed for this visit.      Subjective Assessment - 07/21/16 1406    Subjective My shoulder feel stiff.   Patient Stated Goals Use left arm better.   Pain Score 4    Pain Location Arm   Pain Orientation Left   Pain Descriptors / Indicators Sore   Pain Onset More than a month ago                         [facility] Adult PT Treatment/Exercise - 07/21/16 0001      Exercises   Exercises Shoulder;Knee/Hip     Knee/Hip Exercises: Aerobic   Nustep Level 5 x 15 minutes.     Shoulder Exercises: ROM/Strengthening   UBE (Upper Arm Bike) 120 RPM's x 10 minutes.     Manual Therapy   Manual Therapy Passive ROM   Passive ROM In supine:  Left UE PROM with low load long duration stretching technique x 13 minutes.                   PT Short Term Goals - 06/21/16 1355      PT SHORT TERM GOAL #1   Title Independent with an initial HEP.   Time 4   Period Weeks   Status Achieved     PT SHORT TERM GOAL #2   Title Restore full left UE PROM.   Time 4   Period Weeks   Status On-going           PT Long Term Goals - 07/14/16 1359      PT LONG TERM GOAL #1   Title Independent with an advanced HEP.   Period Weeks   Status On-going     PT LONG TERM GOAL #2   Title Active left shoulder flexion to 145 degrees so the patient can easily reach overhead   Period Weeks   Status On-going     PT LONG TERM GOAL #3   Title Active ER to 70 degrees+ to allow for easily donning/doffing of apparel   Time 8   Period Weeks     PT LONG TERM GOAL #4  Title Increase left shoulder strength to a solid 4+/5 to increase stability for performance of functional activities   Time 8   Period Weeks   Status On-going     PT LONG TERM GOAL #5   Title Perform ADL's with no complaints.   Period Weeks   Status On-going               Plan - 08/05/2016 1408    Clinical Impression Statement Left UE continues to presenst with moderate levels of tone.      Patient will benefit from skilled therapeutic intervention in order to improve the following deficits and impairments:  Decreased activity tolerance, Decreased strength, Decreased range of motion, Impaired UE functional use, Impaired tone  Visit Diagnosis: Stiffness of left shoulder, not elsewhere classified  Stiffness of left elbow, not elsewhere classified  Difficulty in walking, not elsewhere classified       G-Codes - 08-05-16 1405    Functional Assessment Tool Used (Outpatient Only) Clinical judgement...10th visit.   Functional Limitation Carrying, moving and handling objects   Carrying, Moving and Handling Objects Current Status [phone]) At least 40 percent but less than 60 percent impaired, limited or restricted   Carrying, Moving and  Handling Objects Goal Status ([id number]) At least 20 percent but less than 40 percent impaired, limited or restricted      Problem List Patient Active Problem List   Diagnosis Date Noted  . Parkinson disease ([place]) 09/05/2015  . Vitamin D deficiency 09/05/2015  . Metabolic syndrome 93/79/0240  . Hypertension 04/02/2013  . Hyperlipidemia 04/02/2013  . BPH (benign prostatic hyperplasia) 04/02/2013  . Left arm weakness 11/21/2012  . Parkinsonism ([place]) 11/21/2012  . Neck pain 11/21/2012  . Colon adenomas 06/29/2012  . Rectal mass 06/29/2012  . Schizophrenia ([place]) 06/22/2012  . Dysphagia 06/22/2011  . Eosinophilic esophagitis 97/35/3299    Brayden Betters, [place] MPT 08-05-2016, 2:10 PM  [facility] [address] [place], [place], [address] Phone: [phone]   Fax:  [phone]  Name: Mark Benjamin MRN: [id number] Date of Birth: 24-May-1960

## 2016-07-26 ENCOUNTER — Ambulatory Visit: Payer: PPO | Admitting: Physical Therapy

## 2016-07-26 ENCOUNTER — Encounter: Payer: Self-pay | Admitting: Physical Therapy

## 2016-07-26 DIAGNOSIS — M25612 Stiffness of left shoulder, not elsewhere classified: Secondary | ICD-10-CM

## 2016-07-26 DIAGNOSIS — M25622 Stiffness of left elbow, not elsewhere classified: Secondary | ICD-10-CM

## 2016-07-26 DIAGNOSIS — R262 Difficulty in walking, not elsewhere classified: Secondary | ICD-10-CM

## 2016-07-26 NOTE — Therapy (Signed)
[facility] [place], [place], [address] Phone: [phone]   Fax:  [phone]  Physical Therapy Treatment  Patient Details  Name: Mark Benjamin MRN: [id number] Date of Birth: October 03, 1960 Referring Provider: Redge Gainer, MD  Encounter Date: 07/26/2016      PT End of Session - 07/26/16 1356    Visit Number 11   Number of Visits 16   Date for PT Re-Evaluation 08/13/16   PT Start Time 1300   PT Stop Time 1340   PT Time Calculation (min) 40 min   Activity Tolerance Patient tolerated treatment well   Behavior During Therapy [facility] for tasks assessed/performed      Past Medical History:  Diagnosis Date  . Cataract   . Colon polyps   . Dysphagia   . Essential hypertension, benign   . Fatty liver   . Hyperplasia of prostate   . 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 Other and unspecified hyperlipidemia   . Schizophrenia [facility])     Past Surgical History:  Procedure Laterality Date  . COLONOSCOPY  02/13/08  . COLONOSCOPY N/A 07/10/2012   Procedure: COLONOSCOPY;  Surgeon: Rogene Houston, MD;  Location[facility];  Service: Endoscopy;  Laterality: N/A;  225  . UPPER GASTROINTESTINAL ENDOSCOPY  EGD ED   09/09/2010  . URETHRAL DILATION  1965    There were no vitals filed for this visit.      Subjective Assessment - 07/26/16 1354    Subjective Pt reporting his legs feel stronger since beginning therapy. Pt still complaining of tightness in his left shoulder.    Patient is accompained by: Family member   Pertinent History Parkinson.  Schizophrenia.   Limitations Walking;Reading;Writing;House hold activities;Lifting   Patient Stated Goals Use left arm better.   Currently in Pain? No/denies                                 PT Education - 07/26/16 1355    Education Details Reviewed wrist extension stretching and weight bearing to reduce tone.   Person(s) Educated  Patient   Methods Explanation;Demonstration;Verbal cues   Comprehension Returned demonstration;Verbalized understanding;Verbal cues required          PT Short Term Goals - 06/21/16 1355      PT SHORT TERM GOAL #1   Title Independent with an initial HEP.   Time 4   Period Weeks   Status Achieved     PT SHORT TERM GOAL #2   Title Restore full left UE PROM.   Time 4   Period Weeks   Status On-going           PT Long Term Goals - 07/14/16 1359      PT LONG TERM GOAL #1   Title Independent with an advanced HEP.   Period Weeks   Status On-going     PT LONG TERM GOAL #2   Title Active left shoulder flexion to 145 degrees so the patient can easily reach overhead   Period Weeks   Status On-going     PT LONG TERM GOAL #3   Title Active ER to 70 degrees+ to allow for easily donning/doffing of apparel   Time 8   Period Weeks     PT LONG TERM GOAL #4   Title Increase left shoulder strength to a solid 4+/5 to increase stability for performance of  functional activities   Time 8   Period Weeks   Status On-going     PT LONG TERM GOAL #5   Title Perform ADL's with no complaints.   Period Weeks   Status On-going               Plan - 07/26/16 1356    Clinical Impression Statement Pt continues to tolerate sessions well. Pt with moderate levels of tone, pt instructed in Passive stretching and weight bearing to reduce tone. Continue with skilled PT as pt tolerates.    Rehab Potential Good   Clinical Impairments Affecting Rehab Potential Pt is a fall risk, shuffeling gait pattern   PT Frequency 2x / week   PT Duration 8 weeks   PT Treatment/Interventions ADLs/Self Care Home Management;Electrical Stimulation;Moist Heat;Ultrasound;Patient/family education;Therapeutic exercise;Therapeutic activities;Manual techniques;Passive range of motion;Vasopneumatic Device;Dry needling   PT Next Visit Plan Modalites as needed should pain be reported.  Nustep; left UE range of motion  with progression to strengthening.  Please supervise patient at all times as he is at risk for falls.   PT Home Exercise Plan wrist extension stretch, pt to bring UBE at next visit for further instructional use, wall push ups, wrist/finger extension stretching, walking the ball up the wall, rolling ball across the table with bilateral UE's, PROM wrist extension   Consulted and Agree with Plan of Care Patient      Patient will benefit from skilled therapeutic intervention in order to improve the following deficits and impairments:  Decreased activity tolerance, Decreased strength, Decreased range of motion, Impaired UE functional use, Impaired tone  Visit Diagnosis: Stiffness of left shoulder, not elsewhere classified  Stiffness of left elbow, not elsewhere classified  Difficulty in walking, not elsewhere classified     Problem List Patient Active Problem List   Diagnosis Date Noted  . Parkinson disease ([place]) 09/05/2015  . Vitamin D deficiency 09/05/2015  . Metabolic syndrome 85/04/7739  . Hypertension 04/02/2013  . Hyperlipidemia 04/02/2013  . BPH (benign prostatic hyperplasia) 04/02/2013  . Left arm weakness 11/21/2012  . Parkinsonism ([place]) 11/21/2012  . Neck pain 11/21/2012  . Colon adenomas 06/29/2012  . Rectal mass 06/29/2012  . Schizophrenia ([place]) 06/22/2012  . Dysphagia 06/22/2011  . Eosinophilic esophagitis 28/78/6767    Oretha Caprice, MPT 07/26/2016, 1:59 PM  [facility] [address] [place], [place], [address] Phone: [phone]   Fax:  [phone]  Name: Mark Benjamin MRN: [id number] Date of Birth: April 11, 1960

## 2016-07-28 ENCOUNTER — Encounter: Payer: PPO | Admitting: Physical Therapy

## 2016-07-29 ENCOUNTER — Ambulatory Visit: Payer: PPO | Admitting: Physical Therapy

## 2016-07-29 ENCOUNTER — Encounter: Payer: Self-pay | Admitting: Physical Therapy

## 2016-07-29 DIAGNOSIS — M25612 Stiffness of left shoulder, not elsewhere classified: Secondary | ICD-10-CM

## 2016-07-29 DIAGNOSIS — R262 Difficulty in walking, not elsewhere classified: Secondary | ICD-10-CM

## 2016-07-29 DIAGNOSIS — M25622 Stiffness of left elbow, not elsewhere classified: Secondary | ICD-10-CM

## 2016-07-29 NOTE — Therapy (Signed)
[facility] [place], [place], [address] Phone: [phone]   Fax:  [phone]  Physical Therapy Treatment  Patient Details  Name: Mark Benjamin MRN: [id number] Date of Birth: 1960-04-15 Referring Provider: Redge Gainer, MD  Encounter Date: 07/29/2016      PT End of Session - 07/29/16 1355    Visit Number 12   Number of Visits 16   Date for PT Re-Evaluation 08/13/16   PT Start Time 1300   PT Stop Time 1340   PT Time Calculation (min) 40 min   Activity Tolerance Patient tolerated treatment well   Behavior During Therapy [facility] for tasks assessed/performed      Past Medical History:  Diagnosis Date  . Cataract   . Colon polyps   . Dysphagia   . Essential hypertension, benign   . Fatty liver   . Hyperplasia of prostate   . 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 Other and unspecified hyperlipidemia   . Schizophrenia [facility])     Past Surgical History:  Procedure Laterality Date  . COLONOSCOPY  02/13/08  . COLONOSCOPY N/A 07/10/2012   Procedure: COLONOSCOPY;  Surgeon: Rogene Houston, MD;  Location[facility];  Service: Endoscopy;  Laterality: N/A;  225  . UPPER GASTROINTESTINAL ENDOSCOPY  EGD ED   09/09/2010  . URETHRAL DILATION  1965    There were no vitals filed for this visit.      Subjective Assessment - 07/29/16 1319    Subjective Patient arriving to therapy complaining of stiffness in his left UE. Pt also reporting weakness in left arm.    Patient is accompained by: Family member   Pertinent History Parkinson.  Schizophrenia.   Limitations Walking;Reading;Writing;House hold activities;Lifting   Patient Stated Goals Use left arm better.   Currently in Pain? No/denies            [facility] PT Assessment - 07/29/16 0001      Assessment   Medical Diagnosis left arm pain/stiffness   Hand Dominance Left     Precautions   Precautions Fall     Restrictions   Weight Bearing  Restrictions No                     [facility] Adult PT Treatment/Exercise - 07/29/16 0001      Exercises   Exercises Shoulder;Knee/Hip     Knee/Hip Exercises: Aerobic   Nustep Level 5 x 15 minutes.     Shoulder Exercises: Pulleys   Flexion 3 minutes   Flexion Limitations verbal cues to keep movment fluid     Manual Therapy   Manual Therapy Passive ROM   Passive ROM In supine:  Left UE PROM with low load long duration stretching technique x 13 minutes.                  PT Short Term Goals - 06/21/16 1355      PT SHORT TERM GOAL #1   Title Independent with an initial HEP.   Time 4   Period Weeks   Status Achieved     PT SHORT TERM GOAL #2   Title Restore full left UE PROM.   Time 4   Period Weeks   Status On-going           PT Long Term Goals - 07/29/16 1356      PT LONG TERM GOAL #1   Title Independent with an advanced HEP.  Status On-going     PT LONG TERM GOAL #2   Title Active left shoulder flexion to 145 degrees so the patient can easily reach overhead   Time 8   Period Weeks   Status On-going     PT LONG TERM GOAL #3   Title Active ER to 70 degrees+ to allow for easily donning/doffing of apparel   Time 8   Status On-going     PT LONG TERM GOAL #4   Title Increase left shoulder strength to a solid 4+/5 to increase stability for performance of functional activities   Status On-going     PT LONG TERM GOAL #5   Title Perform ADL's with no complaints.   Time 8   Period Weeks   Status On-going             Patient will benefit from skilled therapeutic intervention in order to improve the following deficits and impairments:     Visit Diagnosis: Stiffness of left shoulder, not elsewhere classified  Stiffness of left elbow, not elsewhere classified  Difficulty in walking, not elsewhere classified     Problem List Patient Active Problem List   Diagnosis Date Noted  . Parkinson disease ([place]) 09/05/2015  . Vitamin  D deficiency 09/05/2015  . Metabolic syndrome 33/54/5625  . Hypertension 04/02/2013  . Hyperlipidemia 04/02/2013  . BPH (benign prostatic hyperplasia) 04/02/2013  . Left arm weakness 11/21/2012  . Parkinsonism ([place]) 11/21/2012  . Neck pain 11/21/2012  . Colon adenomas 06/29/2012  . Rectal mass 06/29/2012  . Schizophrenia ([place]) 06/22/2012  . Dysphagia 06/22/2011  . Eosinophilic esophagitis 63/89/3734    Oretha Caprice , MPT 07/29/2016, 1:59 PM  [facility] [address] [place], [place], [address] Phone: [phone]   Fax:  [phone]  Name: Mark Benjamin MRN: [id number] Date of Birth: 1960/12/15

## 2016-08-02 ENCOUNTER — Encounter: Payer: Self-pay | Admitting: Physical Therapy

## 2016-08-02 ENCOUNTER — Ambulatory Visit: Payer: PPO | Admitting: Physical Therapy

## 2016-08-02 DIAGNOSIS — R262 Difficulty in walking, not elsewhere classified: Secondary | ICD-10-CM

## 2016-08-02 DIAGNOSIS — M25612 Stiffness of left shoulder, not elsewhere classified: Secondary | ICD-10-CM | POA: Diagnosis not present

## 2016-08-02 DIAGNOSIS — M25622 Stiffness of left elbow, not elsewhere classified: Secondary | ICD-10-CM

## 2016-08-02 NOTE — Therapy (Signed)
[facility] [place], [place], [address] Phone: [phone]   Fax:  [phone]  Physical Therapy Treatment  Patient Details  Name: Mark Benjamin MRN: [id number] Date of Birth: 09/21/1960 Referring Provider: Redge Gainer, MD  Encounter Date: 08/02/2016      PT End of Session - 08/02/16 1311    Visit Number 13   Number of Visits 16   Date for PT Re-Evaluation 08/13/16   PT Start Time 4010   PT Stop Time 1345   PT Time Calculation (min) 42 min   Activity Tolerance Patient tolerated treatment well   Behavior During Therapy [facility] for tasks assessed/performed      Past Medical History:  Diagnosis Date  . Cataract   . Colon polyps   . Dysphagia   . Essential hypertension, benign   . Fatty liver   . Hyperplasia of prostate   . 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 Other and unspecified hyperlipidemia   . Schizophrenia [facility])     Past Surgical History:  Procedure Laterality Date  . COLONOSCOPY  02/13/08  . COLONOSCOPY N/A 07/10/2012   Procedure: COLONOSCOPY;  Surgeon: Rogene Houston, MD;  Location: [facility];  Service: Endoscopy;  Laterality: N/A;  225  . UPPER GASTROINTESTINAL ENDOSCOPY  EGD ED   09/09/2010  . URETHRAL DILATION  1965    There were no vitals filed for this visit.      Subjective Assessment - 08/02/16 1310    Subjective Reports that he has been to [facility] today but states that he has began to see weakness in RUE as well. Patient's sister requests to be educated on PROM/stretching done in PT as well as to educate patient's aide on 08/05/2016 as well.   Patient is accompained by: Family member  Sister, Mark Benjamin   Pertinent History Parkinson.  Schizophrenia.   Limitations Walking;Reading;Writing;House hold activities;Lifting   Patient Stated Goals Use left arm better.   Currently in Pain? No/denies            [facility] PT Assessment - 08/02/16 0001      Assessment    Medical Diagnosis left arm pain/stiffness   Hand Dominance Left     Precautions   Precautions Fall   Precaution Comments No new falls reported since last visit     Restrictions   Weight Bearing Restrictions No                     [facility] Adult PT Treatment/Exercise - 08/02/16 0001      Knee/Hip Exercises: Aerobic   Nustep Level 5 x 15 minutes.     Shoulder Exercises: Supine   Flexion AROM;Left;20 reps   Flexion Limitations Visual and verbal cueing for proper amt of shoulder flexion   Other Supine Exercises AROM/AAROM of L shoulder D1/D2 x15 reps each     Shoulder Exercises: Pulleys   Flexion 3 minutes   Flexion Limitations VCs and assist from PTA for pulley motion     Shoulder Exercises: ROM/Strengthening   UBE (Upper Arm Bike) 90 RPM x5 min  assist from PTA     Manual Therapy   Manual Therapy Passive ROM   Passive ROM PROM of R shoulder into flex/D1 patterns with taping/stroking methods utilized with hand extension as well as hand placement for elbow extension                  PT Short Term Goals -  06/21/16 1355      PT SHORT TERM GOAL #1   Title Independent with an initial HEP.   Time 4   Period Weeks   Status Achieved     PT SHORT TERM GOAL #2   Title Restore full left UE PROM.   Time 4   Period Weeks   Status On-going           PT Long Term Goals - 07/29/16 1356      PT LONG TERM GOAL #1   Title Independent with an advanced HEP.   Status On-going     PT LONG TERM GOAL #2   Title Active left shoulder flexion to 145 degrees so the patient can easily reach overhead   Time 8   Period Weeks   Status On-going     PT LONG TERM GOAL #3   Title Active ER to 70 degrees+ to allow for easily donning/doffing of apparel   Time 8   Status On-going     PT LONG TERM GOAL #4   Title Increase left shoulder strength to a solid 4+/5 to increase stability for performance of functional activities   Status On-going     PT LONG TERM GOAL #5    Title Perform ADL's with no complaints.   Time 8   Period Weeks   Status On-going               Plan - 08/02/16 1356    Clinical Impression Statement Patient continues to present in clinic with LUE stiffness and increased muscular tone. Patient's sister requested consult and demonstration of shoulder stretching that has been done in clinic. Patient able to complete exercises with assist from PTA and with verbal cueing to continue exercises. Visual cueing also utilized to assist with shoulder max ROM. Patient demonstrated difficulty with getting on plinth table with LEs. Patient's sister educated regarding movement patterns completed with PROM with hand placement with finger extension as well as elbow extension. Patient's sister also interested in getting consultation with Parkinson's spsecialist at [facility] facility following conversation with Mark Benjamin, MPT.   Rehab Potential Good   Clinical Impairments Affecting Rehab Potential Pt is a fall risk, shuffeling gait pattern   PT Frequency 2x / week   PT Duration 8 weeks   PT Treatment/Interventions ADLs/Self Care Home Management;Electrical Stimulation;Moist Heat;Ultrasound;Patient/family education;Therapeutic exercise;Therapeutic activities;Manual techniques;Passive range of motion;Vasopneumatic Device;Dry needling   PT Next Visit Plan Continue with shoulder exercises with weightbearing and stretching per MPT POC. Patient's sister requests for patient's aide, Mark Benjamin, to be educated in next treatment regarding ROM/stretches.   PT Home Exercise Plan wrist extension stretch, pt to bring UBE at next visit for further instructional use, wall push ups, wrist/finger extension stretching, walking the ball up the wall, rolling ball across the table with bilateral UE's, PROM wrist extension   Family Member Consulted sister      Patient will benefit from skilled therapeutic intervention in order to improve the following deficits and  impairments:  Decreased activity tolerance, Decreased strength, Decreased range of motion, Impaired UE functional use, Impaired tone  Visit Diagnosis: Stiffness of left shoulder, not elsewhere classified  Stiffness of left elbow, not elsewhere classified  Difficulty in walking, not elsewhere classified     Problem List Patient Active Problem List   Diagnosis Date Noted  . Parkinson disease ([place]) 09/05/2015  . Vitamin D deficiency 09/05/2015  . Metabolic syndrome 41/66/0630  . Hypertension 04/02/2013  . Hyperlipidemia 04/02/2013  . BPH (  benign prostatic hyperplasia) 04/02/2013  . Left arm weakness 11/21/2012  . Parkinsonism ([place]) 11/21/2012  . Neck pain 11/21/2012  . Colon adenomas 06/29/2012  . Rectal mass 06/29/2012  . Schizophrenia ([place]) 06/22/2012  . Dysphagia 06/22/2011  . Eosinophilic esophagitis 21/19/4174    Mark Benjamin, PTA 08/02/16 2:04 PM  [facility] [address] [place], [place], [address] Phone: [phone]   Fax:  [phone]  Name: Mark Benjamin MRN: [id number] Date of Birth: 02-25-1961

## 2016-08-05 ENCOUNTER — Ambulatory Visit: Payer: PPO | Admitting: *Deleted

## 2016-08-05 DIAGNOSIS — M25612 Stiffness of left shoulder, not elsewhere classified: Secondary | ICD-10-CM

## 2016-08-05 DIAGNOSIS — M25622 Stiffness of left elbow, not elsewhere classified: Secondary | ICD-10-CM

## 2016-08-05 DIAGNOSIS — R262 Difficulty in walking, not elsewhere classified: Secondary | ICD-10-CM

## 2016-08-05 NOTE — Therapy (Signed)
[facility] [place], [place], [address] Phone: [phone]   Fax:  [phone]  Physical Therapy Treatment  Patient Details  Name: Mark Benjamin MRN: [id number] Date of Birth: 1961/02/05 Referring Provider: Redge Gainer, MD  Encounter Date: 08/05/2016      PT End of Session - 08/05/16 1806    Visit Number 14   Number of Visits 18   Date for PT Re-Evaluation 08/13/16   PT Start Time 1300   PT Stop Time 1350   PT Time Calculation (min) 50 min      Past Medical History:  Diagnosis Date  . Cataract   . Colon polyps   . Dysphagia   . Essential hypertension, benign   . Fatty liver   . Hyperplasia of prostate   . 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 Other and unspecified hyperlipidemia   . Schizophrenia [facility])     Past Surgical History:  Procedure Laterality Date  . COLONOSCOPY  02/13/08  . COLONOSCOPY N/A 07/10/2012   Procedure: COLONOSCOPY;  Surgeon: Rogene Houston, MD;  [facility];  Service: Endoscopy;  Laterality: N/A;  225  . UPPER GASTROINTESTINAL ENDOSCOPY  EGD ED   09/09/2010  . URETHRAL DILATION  1965    There were no vitals filed for this visit.      Subjective Assessment - 08/05/16 1258    Subjective Reports that he has been to [facility] today but states that he has began to see weakness in RUE as well. Patient's sister requests to be educated on PROM/stretching done in PT as well as to educate patient's aide on 08/05/2016 as well.   Pertinent History Parkinson.  Schizophrenia.   Limitations Walking;Reading;Writing;House hold activities;Lifting   Patient Stated Goals Use left arm better.   Currently in Pain? No/denies                         [facility] Adult PT Treatment/Exercise - 08/05/16 0001      Exercises   Exercises Shoulder;Knee/Hip     Knee/Hip Exercises: Aerobic   Nustep Level 5 x 15 minutes.     Shoulder Exercises: Supine   Flexion  AROM;Left;20 reps   Flexion Limitations Visual and verbal cueing for proper amt of shoulder flexion   Other Supine Exercises AROM/AAROM of L shoulder D1/D2 x15 reps each     Shoulder Exercises: Pulleys   Flexion --  5 mins   Flexion Limitations VCs and assist from PTA for pulley motion     Shoulder Exercises: ROM/Strengthening   UBE (Upper Arm Bike) 90 RPM x5 min  assist from PTA     Manual Therapy   Manual Therapy Passive ROM   Passive ROM In supine:  Left UE PROM with low load long duration stretching technique                   PT Short Term Goals - 06/21/16 1355      PT SHORT TERM GOAL #1   Title Independent with an initial HEP.   Time 4   Period Weeks   Status Achieved     PT SHORT TERM GOAL #2   Title Restore full left UE PROM.   Time 4   Period Weeks   Status On-going           PT Long Term Goals - 07/29/16 1356      PT LONG TERM GOAL #  1   Title Independent with an advanced HEP.   Status On-going     PT LONG TERM GOAL #2   Title Active left shoulder flexion to 145 degrees so the patient can easily reach overhead   Time 8   Period Weeks   Status On-going     PT LONG TERM GOAL #3   Title Active ER to 70 degrees+ to allow for easily donning/doffing of apparel   Time 8   Status On-going     PT LONG TERM GOAL #4   Title Increase left shoulder strength to a solid 4+/5 to increase stability for performance of functional activities   Status On-going     PT LONG TERM GOAL #5   Title Perform ADL's with no complaints.   Time 8   Period Weeks   Status On-going               Plan - 08/05/16 1259    Clinical Impression Statement Pt arrived to clinic today doing fairly well. He states he has less soreness in LT hand today and feels he is moving LT UE better. Manual techniques were demonstrated to caregiver for RT UE AAROM and PROM.Marland Kitchen Visual and verbal cueing were needed at times to help Pt continue with EX.. End of x Pt was able to Lay LT  Hand down Flat   Rehab Potential Good   Clinical Impairments Affecting Rehab Potential Pt is a fall risk, shuffeling gait pattern   PT Frequency 2x / week   PT Duration 8 weeks   PT Treatment/Interventions ADLs/Self Care Home Management;Electrical Stimulation;Moist Heat;Ultrasound;Patient/family education;Therapeutic exercise;Therapeutic activities;Manual techniques;Passive range of motion;Vasopneumatic Device;Dry needling   PT Next Visit Plan Continue with shoulder exercises with weightbearing and stretching per MPT POC. Patient's sister requests for patient's aide, Blanch Media, to be educated in next treatment regarding ROM/stretches.   PT Home Exercise Plan wrist extension stretch, pt to bring UBE at next visit for further instructional use, wall push ups, wrist/finger extension stretching, walking the ball up the wall, rolling ball across the table with bilateral UE's, PROM wrist extension   Consulted and Agree with Plan of Care Patient      Patient will benefit from skilled therapeutic intervention in order to improve the following deficits and impairments:  Decreased activity tolerance, Decreased strength, Decreased range of motion, Impaired UE functional use, Impaired tone  Visit Diagnosis: Stiffness of left shoulder, not elsewhere classified  Stiffness of left elbow, not elsewhere classified  Difficulty in walking, not elsewhere classified     Problem List Patient Active Problem List   Diagnosis Date Noted  . Parkinson disease ([place]) 09/05/2015  . Vitamin D deficiency 09/05/2015  . Metabolic syndrome 63/03/6008  . Hypertension 04/02/2013  . Hyperlipidemia 04/02/2013  . BPH (benign prostatic hyperplasia) 04/02/2013  . Left arm weakness 11/21/2012  . Parkinsonism ([place]) 11/21/2012  . Neck pain 11/21/2012  . Colon adenomas 06/29/2012  . Rectal mass 06/29/2012  . Schizophrenia ([place]) 06/22/2012  . Dysphagia 06/22/2011  . Eosinophilic esophagitis 93/23/5573    RAMSEUR,CHRIS,  PTA 08/05/2016, 6:16 PM  [facility] [place], [place], [address] Phone: [phone]   Fax:  [phone]  Name: ESCO JOSLYN MRN: [id number] Date of Birth: 29-May-1960

## 2016-08-09 ENCOUNTER — Encounter: Payer: Self-pay | Admitting: Physical Therapy

## 2016-08-09 ENCOUNTER — Ambulatory Visit: Payer: PPO | Admitting: Physical Therapy

## 2016-08-09 DIAGNOSIS — M25612 Stiffness of left shoulder, not elsewhere classified: Secondary | ICD-10-CM

## 2016-08-09 DIAGNOSIS — R262 Difficulty in walking, not elsewhere classified: Secondary | ICD-10-CM

## 2016-08-09 DIAGNOSIS — M25622 Stiffness of left elbow, not elsewhere classified: Secondary | ICD-10-CM

## 2016-08-09 NOTE — Therapy (Signed)
[facility] [place], [place], [address] Phone: [phone]   Fax:  [phone]  Physical Therapy Treatment  Patient Details  Name: Mark Benjamin MRN: [id number] Date of Birth: July 04, 1960 Referring Provider: Redge Gainer, MD  Encounter Date: 08/09/2016      PT End of Session - 08/09/16 1308    Visit Number 15   Number of Visits 18   Date for PT Re-Evaluation 08/13/16   PT Start Time 4481  2 units secondary to late arrival and time reqiured to ambulate in clinic.   PT Stop Time 1345   PT Time Calculation (min) 39 min   Activity Tolerance Patient tolerated treatment well   Behavior During Therapy WFL for tasks assessed/performed      Past Medical History:  Diagnosis Date  . Cataract   . Colon polyps   . Dysphagia   . Essential hypertension, benign   . Fatty liver   . Hyperplasia of prostate   . 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 Other and unspecified hyperlipidemia   . Schizophrenia [facility])     Past Surgical History:  Procedure Laterality Date  . COLONOSCOPY  02/13/08  . COLONOSCOPY N/A 07/10/2012   Procedure: COLONOSCOPY;  Surgeon: Rogene Houston, MD;  Location: [facility];  Service: Endoscopy;  Laterality: N/A;  225  . UPPER GASTROINTESTINAL ENDOSCOPY  EGD ED   09/09/2010  . URETHRAL DILATION  1965    There were no vitals filed for this visit.      Subjective Assessment - 08/09/16 1307    Subjective Reports that now both arms are getting weak. Reports that last time he went to [facility] was last thursday.   Pertinent History Parkinson.  Schizophrenia.   Limitations Walking;Reading;Writing;House hold activities;Lifting   Patient Stated Goals Use left arm better.   Currently in Pain? No/denies            [facility] PT Assessment - 08/09/16 0001      Assessment   Medical Diagnosis left arm pain/stiffness   Hand Dominance Left     Precautions   Precautions Fall   Precaution Comments No new falls reported since last visit     Restrictions   Weight Bearing Restrictions No                     [facility] Adult PT Treatment/Exercise - 08/09/16 0001      Knee/Hip Exercises: Aerobic   Nustep Level 5 x 15 minutes.     Shoulder Exercises: Supine   Flexion AROM;Left;20 reps   Other Supine Exercises AROM/AAROM L shoulder D2 x15 reps   Other Supine Exercises Manually resisted L shoulder D2 and elbow flexion/extension x15 repse ach     Shoulder Exercises: Standing   Other Standing Exercises Standing D1/D2 with ball x20 reps   Other Standing Exercises Standing B shoulder flexion with ball x20 reps     Shoulder Exercises: ROM/Strengthening   UBE (Upper Arm Bike) 90 RPM x5 min  with assist from PTA   Wall Pushups 10 reps  Attempted                   PT Short Term Goals - 06/21/16 1355      PT SHORT TERM GOAL #1   Title Independent with an initial HEP.   Time 4   Period Weeks   Status Achieved     PT SHORT TERM GOAL #2  Title Restore full left UE PROM.   Time 4   Period Weeks   Status On-going           PT Long Term Goals - 07/29/16 1356      PT LONG TERM GOAL #1   Title Independent with an advanced HEP.   Status On-going     PT LONG TERM GOAL #2   Title Active left shoulder flexion to 145 degrees so the patient can easily reach overhead   Time 8   Period Weeks   Status On-going     PT LONG TERM GOAL #3   Title Active ER to 70 degrees+ to allow for easily donning/doffing of apparel   Time 8   Status On-going     PT LONG TERM GOAL #4   Title Increase left shoulder strength to a solid 4+/5 to increase stability for performance of functional activities   Status On-going     PT LONG TERM GOAL #5   Title Perform ADL's with no complaints.   Time 8   Period Weeks   Status On-going               Plan - 08/09/16 1348    Clinical Impression Statement Patient arrived to clinic with continued freezing  intermittantly with exercises. Patient requires constant VCs as well as mirroring for technique. Multiple PNF exercises attempted today to assist with trunk rotation and to reduce stiffness. Patient requires mod assist with bed mobility to move into supine exercises. Patient did fairly well with manually resisted L shoulder PNF activities.   Rehab Potential Good   Clinical Impairments Affecting Rehab Potential Pt is a fall risk, shuffeling gait pattern   PT Frequency 2x / week   PT Duration 8 weeks   PT Treatment/Interventions ADLs/Self Care Home Management;Electrical Stimulation;Moist Heat;Ultrasound;Patient/family education;Therapeutic exercise;Therapeutic activities;Manual techniques;Passive range of motion;Vasopneumatic Device;Dry needling   PT Next Visit Plan Continue per neuro assessment.   PT Home Exercise Plan wrist extension stretch, pt to bring UBE at next visit for further instructional use, wall push ups, wrist/finger extension stretching, walking the ball up the wall, rolling ball across the table with bilateral UE's, PROM wrist extension   Consulted and Agree with Plan of Care Patient      Patient will benefit from skilled therapeutic intervention in order to improve the following deficits and impairments:  Decreased activity tolerance, Decreased strength, Decreased range of motion, Impaired UE functional use, Impaired tone  Visit Diagnosis: Stiffness of left shoulder, not elsewhere classified  Stiffness of left elbow, not elsewhere classified  Difficulty in walking, not elsewhere classified     Problem List Patient Active Problem List   Diagnosis Date Noted  . Parkinson disease ([place]) 09/05/2015  . Vitamin D deficiency 09/05/2015  . Metabolic syndrome 02/63/7858  . Hypertension 04/02/2013  . Hyperlipidemia 04/02/2013  . BPH (benign prostatic hyperplasia) 04/02/2013  . Left arm weakness 11/21/2012  . Parkinsonism ([place]) 11/21/2012  . Neck pain 11/21/2012  . Colon  adenomas 06/29/2012  . Rectal mass 06/29/2012  . Schizophrenia ([place]) 06/22/2012  . Dysphagia 06/22/2011  . Eosinophilic esophagitis 85/04/7739    Wynelle Fanny, PTA 08/09/2016, 1:52 PM  [facility] [address] [place], [place], [address] Phone: [phone]   Fax:  [phone]  Name: Mark Benjamin MRN: [id number] Date of Birth: [birth year]

## 2016-08-11 ENCOUNTER — Ambulatory Visit: Payer: PPO | Admitting: Physical Therapy

## 2016-08-11 DIAGNOSIS — M25622 Stiffness of left elbow, not elsewhere classified: Secondary | ICD-10-CM

## 2016-08-11 DIAGNOSIS — R262 Difficulty in walking, not elsewhere classified: Secondary | ICD-10-CM

## 2016-08-11 DIAGNOSIS — M25612 Stiffness of left shoulder, not elsewhere classified: Secondary | ICD-10-CM | POA: Diagnosis not present

## 2016-08-11 NOTE — Therapy (Signed)
[place] [address] [place], [place], [address] Phone: [phone]   Fax:  [phone]  Physical Therapy Treatment  Patient Details  Name: JJ ENYEART MRN: [id number] Date of Birth: [birth year] Referring Provider: Redge Gainer, MD  Encounter Date: 08/11/2016      PT End of Session - 08/11/16 2213    Visit Number 16   Number of Visits 18   Date for PT Re-Evaluation 08/13/16   PT Start Time 1001   PT Stop Time 1108   PT Time Calculation (min) 67 min   Activity Tolerance Patient tolerated treatment well   Behavior During Therapy [facility] for tasks assessed/performed      Past Medical History:  Diagnosis Date  . Cataract   . Colon polyps   . Dysphagia   . Essential hypertension, benign   . Fatty liver   . Hyperplasia of prostate   . 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 Other and unspecified hyperlipidemia   . Schizophrenia [facility])     Past Surgical History:  Procedure Laterality Date  . COLONOSCOPY  02/13/08  . COLONOSCOPY N/A 07/10/2012   Procedure: COLONOSCOPY;  Surgeon: Rogene Houston, MD;  Location: [facility];  Service: Endoscopy;  Laterality: N/A;  225  . UPPER GASTROINTESTINAL ENDOSCOPY  EGD ED   09/09/2010  . URETHRAL DILATION  1965    There were no vitals filed for this visit.      Subjective Assessment - 08/11/16 1006    Subjective Sister present and discussed that he has seen PT in [place] for several weeks.  Feel he might want to progress more in therapy.  Has an aide who assists with bathing and dressing.  Pt reports having difficulty with transfers to stand as well as bed mobility at times.   Pertinent History Parkinson.  Schizophrenia.   Limitations Walking;Reading;Writing;House hold activities;Lifting   Patient Stated Goals Use left arm better.  08/11/16:  Per patient, he would like to also get up from chairs better.   Currently in Pain? No/denies                          [facility] Adult PT Treatment/Exercise - 08/11/16 1214      Transfers   Transfers Sit to Stand;Stand to Sit   Sit to Stand 5: Supervision   Sit to Stand Details Verbal cues for technique;Verbal cues for sequencing  VCs and tactile cues for upright posture upon standing   Five time sit to stand comments  23.28 sec 2nd trial  First trial:  pt stood performed sit<>stand x 1 in 19.78 sec   Stand to Sit 4: Min guard;Uncontrolled descent;Without upper extremity assist;With upper extremity assist   Number of Reps 2 sets;Other reps (comment)  5 reps with best upright posture upon standing   Transfer Cueing For stand>sit, pt requires cues for slow, controlled descent into sitting     Ambulation/Gait   Ambulation/Gait Yes   Ambulation/Gait Assistance 5: Supervision   Ambulation/Gait Assistance Details VCs for increased step length; tactile cues for trunk rotation and initiation of arm swing.  Pt has difficulty with coordination of reciprocal arm swing with gait.   Ambulation Distance (Feet) 600 Feet   Assistive device --  trial use of bilateral walking poles to facilitate arm swing   Gait Pattern Step-through pattern;Decreased arm swing - left;Decreased step length - left;Festinating;Decreased trunk rotation  Foot slap on R  Ambulation Surface Level;Indoor   Gait velocity 13.53 sec = 2.42 ft/sec     Posture/Postural Control   Posture/Postural Control Postural limitations   Postural Limitations Rounded Shoulders;Forward head;Posterior pelvic tilt   Posture Comments Holds L UE in elbow flexion and against torso.       High Level Balance   High Level Balance Comments Posterior push and release test:  Pt takes multiple small steps in posterior direction, needing therapist assistance to regain balance.  Forward push and release test:  pt takes 2 steps in anterior direction and maintains balance.     Self-Care   Self-Care Other Self-Care Comments   Other  Self-Care Comments  Discussed with patient and sister possibilities for continued POC, including benefits to therapy (occupational therapy and/or continued physical therapy) to address whole body, large amplitude movement patterns to address posture, weightshifting, trunk flexibility and step initiation, such as PWR! ([facility]).  Discussed that PWR! trained therapists are available at [facility], and pt's sister is interested in pursuing OT there.  PT discussed continued PT to focus on transfers, functional mobility, posture to reinforce concepts from [facility]! Moves in [place].  Pt's sister voiced concerns about distance to this clinic, and therefore, she would like to pursue OT at [facility].  She feels that PT may continue to be beneficial to address transfers, functional mobility.        Additional Therapeutic Activities: Seated PWR! Moves:  PWR! Up for posture x 10 reps, PWR! Rock for weightshifting x 10 reps, PWR! Twist for trunk rotation x 10 reps, then PWR! Step out and in for step intiation x 10 reps.  Pt requires visual cues (PT mirroring exercises), with verbal and tactile cues for technique and positioning.  Pt performs first 5-6 reps with good amplitude, then tends to trail off with decreased amplitude/largeness of movement patterns.  PWR! Hands performed:  "flicks" x 10 reps, with pt balling up fists, bringing elbows into flexion, then flicking open fingers and extending elbows, PWR! Rock x 8 reps, with elbows active wrist flexion and extension, then PWR! Twist x 5 reps, with trial of pronation/supination bilaterally.  Pt noted to have limitations most with pronation and supination movement pattern.  Pt requires visual cues (PT mirroring above exercises) with verbal cues.          PT Education - 08/11/16 2211    Education provided Yes   Education Details Transfer training with transfer technique, ways to address pt's slowed, smaller movement patterns, rigidity and posture-see  self care note and clinical impression   Person(s) Educated Patient;Other (comment)  Sister   Methods Explanation;Demonstration;Handout   Comprehension Verbalized understanding;Returned demonstration;Verbal cues required;Need further instruction          PT Short Term Goals - 06/21/16 1355      PT SHORT TERM GOAL #1   Title Independent with an initial HEP.   Time 4   Period Weeks   Status Achieved     PT SHORT TERM GOAL #2   Title Restore full left UE PROM.   Time 4   Period Weeks   Status On-going           PT Long Term Goals - 07/29/16 1356      PT LONG TERM GOAL #1   Title Independent with an advanced HEP.   Status On-going     PT LONG TERM GOAL #2   Title Active left shoulder flexion to 145 degrees so the patient can easily reach  overhead   Time 8   Period Weeks   Status On-going     PT LONG TERM GOAL #3   Title Active ER to 70 degrees+ to allow for easily donning/doffing of apparel   Time 8   Status On-going     PT LONG TERM GOAL #4   Title Increase left shoulder strength to a solid 4+/5 to increase stability for performance of functional activities   Status On-going     PT LONG TERM GOAL #5   Title Perform ADL's with no complaints.   Time 8   Period Weeks   Status On-going               Plan - 08/11/16 2215    Clinical Impression Statement Pt seen today at [place] today at request of therapists at [facility] due to pt's increased LUE tone and Parkinson's-like symptoms.  Pt does present with abnormal posture, bradykinesia, rigidity of LUE and LLE, decreased timing and coordination with gait, festination with turns and changes of directions, postural instability, decreased flexibility.  Pt is at fall risk per TUG score and demo slowed and unsteady/unsafe transfers.  Pt demo postural instability, with multiple steps taken in posterior push and release test, with pt needing assistance to regain balance.  PT trialed seated PWR!  ([facility]) Moves, using large amplitude, whole body movements for posture, trunk flexiblity, weightshifting and step initiation and PWR! Moves for hands, to improve activation of posture and LLUE musculature.  Pt responds well to movement patterns to activate improved upright posture and more extended LUE.  Based on patient's response to [facility]! Moves trial as well as pt's sister's desire to address ADLs, pt would benefit from occupational therapy with PWR! trained therapist at [facility].  In addition, pt may benefit from return consult with neurologist to address possible additional ways to address bradykinesia, rigidity and overall slowed, stiff movement patterns.   Rehab Potential Good   Clinical Impairments Affecting Rehab Potential Pt is a fall risk, shuffeling gait pattern   PT Frequency 2x / week   PT Duration 8 weeks   PT Treatment/Interventions ADLs/Self Care Home Management;Electrical Stimulation;Moist Heat;Ultrasound;Patient/family education;Therapeutic exercise;Therapeutic activities;Manual techniques;Passive range of motion;Vasopneumatic Device;Dry needling   PT Next Visit Plan Fully assess LTGs previously set; discuss with pt/sister renewal for PT to work on transfers, posture, trunk flexibility/trunk rotation for improved activation of LUE and trunk; review sit<>stand technique practiced this visit.   PT Home Exercise Plan wrist extension stretch, pt to bring UBE at next visit for further instructional use, wall push ups, wrist/finger extension stretching, walking the ball up the wall, rolling ball across the table with bilateral UE's, PROM wrist extension   Consulted and Agree with Plan of Care Patient;Family member/caregiver   Family Member Consulted Sister      Patient will benefit from skilled therapeutic intervention in order to improve the following deficits and impairments:  Decreased activity tolerance, Decreased strength, Decreased range of motion, Impaired UE  functional use, Impaired tone  Visit Diagnosis: Stiffness of left shoulder, not elsewhere classified  Stiffness of left elbow, not elsewhere classified  Difficulty in walking, not elsewhere classified     Problem List Patient Active Problem List   Diagnosis Date Noted  . Parkinson disease ([place]) 09/05/2015  . Vitamin D deficiency 09/05/2015  . Metabolic syndrome 64/68/0321  . Hypertension 04/02/2013  . Hyperlipidemia 04/02/2013  . BPH (benign prostatic hyperplasia) 04/02/2013  . Left arm weakness 11/21/2012  . Parkinsonism ([place])  11/21/2012  . Neck pain 11/21/2012  . Colon adenomas 06/29/2012  . Rectal mass 06/29/2012  . Schizophrenia ([place]) 06/22/2012  . Dysphagia 06/22/2011  . Eosinophilic esophagitis 16/12/9602    Statia Burdick W. 08/11/2016, 10:29 PM  Frazier Butt., PT   [place] [address] [place], [place], [address] Phone: [phone]   Fax:  [phone]  Name: AADARSH COZORT MRN: [id number] Date of Birth: 02/27/1961

## 2016-08-11 NOTE — Patient Instructions (Signed)
Sit to Stand Transfers:  1. Scoot out to the edge of the chair 2. Place your feet flat on the floor, shoulder width apart.  Make sure your feet are tucked just under your knees. 3. Lean forward (nose over toes) with momentum, and stand up tall with your best posture.  If you need to use your arms, use them as a quick boost up to stand. 4. If you are in a low or soft chair, you can lean back and then forward up to stand, in order to get more momentum. 5. Once you are standing, make sure you are looking ahead and standing tall.  To sit down:  1. Back up until you feel the chair behind your legs. 2. Bend at you hips, reaching  Back for you chair, if needed, then slowly squat to sit down on your chair. 

## 2016-08-12 ENCOUNTER — Encounter: Payer: PPO | Admitting: Physical Therapy

## 2016-08-13 DIAGNOSIS — F29 Unspecified psychosis not due to a substance or known physiological condition: Secondary | ICD-10-CM | POA: Diagnosis not present

## 2016-08-13 NOTE — Progress Notes (Signed)
Please evaluate and [facility] by physical therapy. Occupational therapy order to evaluate and treat in a patient who has Parkinson's disease. Specifically by a Parkinson wellness recovery trained therapist.

## 2016-08-17 ENCOUNTER — Ambulatory Visit: Payer: PPO | Admitting: *Deleted

## 2016-08-17 ENCOUNTER — Telehealth: Payer: Self-pay | Admitting: *Deleted

## 2016-08-17 DIAGNOSIS — R262 Difficulty in walking, not elsewhere classified: Secondary | ICD-10-CM

## 2016-08-17 DIAGNOSIS — M25612 Stiffness of left shoulder, not elsewhere classified: Secondary | ICD-10-CM

## 2016-08-17 DIAGNOSIS — Z741 Need for assistance with personal care: Secondary | ICD-10-CM

## 2016-08-17 DIAGNOSIS — M25622 Stiffness of left elbow, not elsewhere classified: Secondary | ICD-10-CM

## 2016-08-17 DIAGNOSIS — R531 Weakness: Secondary | ICD-10-CM

## 2016-08-17 DIAGNOSIS — Z789 Other specified health status: Secondary | ICD-10-CM

## 2016-08-17 NOTE — Telephone Encounter (Signed)
Received voicemail from [employer], medical power of attorney for [employer].  She has questions about patient being referred to Occupational Therapy at [place] Ms. Copland back and left voicemail for her to return my call.

## 2016-08-17 NOTE — Therapy (Signed)
[facility] [place], [place], [address] Phone: [phone]   Fax:  [phone]  Physical Therapy Treatment  Patient Details  Name: Mark Benjamin MRN: [id number] Date of Birth: August 20, 1960 Referring Provider: Redge Gainer, MD  Encounter Date: 08/17/2016      PT End of Session - 08/17/16 1354    Visit Number 17   Number of Visits 18   Date for PT Re-Evaluation 08/13/16   PT Start Time 1300   PT Stop Time 1346   PT Time Calculation (min) 46 min      Past Medical History:  Diagnosis Date  . Cataract   . Colon polyps   . Dysphagia   . Essential hypertension, benign   . Fatty liver   . Hyperplasia of prostate   . 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 Other and unspecified hyperlipidemia   . Schizophrenia [facility])     Past Surgical History:  Procedure Laterality Date  . COLONOSCOPY  02/13/08  . COLONOSCOPY N/A 07/10/2012   Procedure: COLONOSCOPY;  Surgeon: Rogene Houston, MD;  Location: [facility] ENDO [facility];  Service: Endoscopy;  Laterality: N/A;  225  . UPPER GASTROINTESTINAL ENDOSCOPY  EGD ED   09/09/2010  . URETHRAL DILATION  1965    There were no vitals filed for this visit.      Subjective Assessment - 08/17/16 1304    Patient is accompained by: Family member   Limitations Walking;Reading;Writing;House hold activities;Lifting   Patient Stated Goals Use left arm better.  08/11/16:  Per patient, he would like to also get up from chairs better.   Currently in Pain? No/denies                         [facility] Adult PT Treatment/Exercise - 08/17/16 0001      Exercises   Exercises Shoulder;Knee/Hip     Shoulder Exercises: Supine   Flexion AROM;Left;20 reps     Shoulder Exercises: ROM/Strengthening   UBE (Upper Arm Bike) 90 RPM x5 min  with assist from PTA     Manual Therapy   Manual Therapy Passive ROM   Passive ROM In supine:  Left UE AAROM/ PROM with low load long  duration stretching technique for [place], elbow and wrist                  PT Short Term Goals - 06/21/16 1355      PT SHORT TERM GOAL #1   Title Independent with an initial HEP.   Time 4   Period Weeks   Status Achieved     PT SHORT TERM GOAL #2   Title Restore full left UE PROM.   Time 4   Period Weeks   Status On-going           PT Long Term Goals - 07/29/16 1356      PT LONG TERM GOAL #1   Title Independent with an advanced HEP.   Status On-going     PT LONG TERM GOAL #2   Title Active left shoulder flexion to 145 degrees so the patient can easily reach overhead   Time 8   Period Weeks   Status On-going     PT LONG TERM GOAL #3   Title Active ER to 70 degrees+ to allow for easily donning/doffing of apparel   Time 8   Status On-going     PT LONG TERM GOAL #4  Title Increase left shoulder strength to a solid 4+/5 to increase stability for performance of functional activities   Status On-going     PT LONG TERM GOAL #5   Title Perform ADL's with no complaints.   Time 8   Period Weeks   Status On-going               Plan - 08/17/16 1355    Clinical Impression Statement Pt did fairly well today with therex, but needed  assistance on pulleys to keep facilitate the motion. He did better with AAROM than just passive in supine position. He was able to reach 135 degrees of flexion and 70 degrees of ER. He also responded well with giving him a target to reach during manual AAROM. decreased tone after Rx.   Rehab Potential Good   Clinical Impairments Affecting Rehab Potential Pt is a fall risk, shuffeling gait pattern   PT Frequency 2x / week   PT Duration 8 weeks   PT Treatment/Interventions ADLs/Self Care Home Management;Electrical Stimulation;Moist Heat;Ultrasound;Patient/family education;Therapeutic exercise;Therapeutic activities;Manual techniques;Passive range of motion;Vasopneumatic Device;Dry needling   PT Next Visit Plan Fully assess LTGs  previously set; discuss with pt/sister renewal for PT to work on transfers, posture, trunk flexibility/trunk rotation for improved activation of LUE and trunk; review sit<>stand technique practiced this visit.   PT Home Exercise Plan wrist extension stretch, pt to bring UBE at next visit for further instructional use, wall push ups, wrist/finger extension stretching, walking the ball up the wall, rolling ball across the table with bilateral UE's, PROM wrist extension   Consulted and Agree with Plan of Care Patient      Patient will benefit from skilled therapeutic intervention in order to improve the following deficits and impairments:  Decreased strength, Decreased range of motion, Impaired UE functional use, Impaired tone  Visit Diagnosis: Stiffness of left shoulder, not elsewhere classified  Stiffness of left elbow, not elsewhere classified  Difficulty in walking, not elsewhere classified     Problem List Patient Active Problem List   Diagnosis Date Noted  . Parkinson disease ([place]) 09/05/2015  . Vitamin D deficiency 09/05/2015  . Metabolic syndrome 54/65/0354  . Hypertension 04/02/2013  . Hyperlipidemia 04/02/2013  . BPH (benign prostatic hyperplasia) 04/02/2013  . Left arm weakness 11/21/2012  . Parkinsonism ([place]) 11/21/2012  . Neck pain 11/21/2012  . Colon adenomas 06/29/2012  . Rectal mass 06/29/2012  . Schizophrenia ([place]) 06/22/2012  . Dysphagia 06/22/2011  . Eosinophilic esophagitis 65/68/1275    Airianna Kreischer,CHRIS, PTA 08/17/2016, 2:03 PM  [facility] [place], [place], [address] Phone: [phone]   Fax:  [phone]  Name: Mark Benjamin MRN: [id number] Date of Birth: [birth year]

## 2016-08-18 NOTE — Telephone Encounter (Signed)
Spoke with patient's sister [name] Copland.  She states when patient was seen by PT Mady Haagensen at [facility], she suggested patient be evaluated and treated by OT at [facility].  [name] understood that the referral would be ordered by Mady Haagensen PT, but no order has been placed yet.  [name] states patient is now needing help from his aid who comes 3-4 days per week with bathing and dressing.  She would like suggestions from OT about the best ways for him to complete his activities of daily living, and to determine what other types of assistance he needs at home.  Advised will send to Dr. Laurance Flatten to place order if he is in agreement.

## 2016-08-18 NOTE — Telephone Encounter (Signed)
Please Place order for this therapy. I thought that we had already recommended that.

## 2016-08-19 ENCOUNTER — Ambulatory Visit: Payer: PPO | Admitting: *Deleted

## 2016-08-19 DIAGNOSIS — M25612 Stiffness of left shoulder, not elsewhere classified: Secondary | ICD-10-CM | POA: Diagnosis not present

## 2016-08-19 DIAGNOSIS — M25622 Stiffness of left elbow, not elsewhere classified: Secondary | ICD-10-CM

## 2016-08-19 NOTE — Telephone Encounter (Signed)
Sister aware and referral placed

## 2016-08-19 NOTE — Therapy (Signed)
[facility] [place], [place], [address] Phone: [phone]   Fax:  [phone]  Physical Therapy Treatment  Patient Details  Name: Mark Benjamin MRN: [id number] Date of Birth: October 27, 1960 Referring Provider: Redge Gainer, MD  Encounter Date: 08/19/2016      PT End of Session - 08/19/16 1327    Visit Number 18   Number of Visits 19   Date for PT Re-Evaluation 08/13/16   PT Start Time 1300   PT Stop Time 1351   PT Time Calculation (min) 51 min      Past Medical History:  Diagnosis Date  . Cataract   . Colon polyps   . Dysphagia   . Essential hypertension, benign   . Fatty liver   . Hyperplasia of prostate   . 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 Other and unspecified hyperlipidemia   . Schizophrenia [facility])     Past Surgical History:  Procedure Laterality Date  . COLONOSCOPY  02/13/08  . COLONOSCOPY N/A 07/10/2012   Procedure: COLONOSCOPY;  Surgeon: Rogene Houston, MD;  Location[facility];  Service: Endoscopy;  Laterality: N/A;  225  . UPPER GASTROINTESTINAL ENDOSCOPY  EGD ED   09/09/2010  . URETHRAL DILATION  1965    There were no vitals filed for this visit.      Subjective Assessment - 08/19/16 1308    Subjective Feeling better with a little more energy.  Did well after last Rx   Patient is accompained by: Family member   Pertinent History Parkinson.  Schizophrenia.   Limitations Walking;Reading;Writing;House hold activities;Lifting   Patient Stated Goals Use left arm better.  08/11/16:  Per patient, he would like to also get up from chairs better.   Currently in Pain? No/denies                         [facility] Adult PT Treatment/Exercise - 08/19/16 0001      Transfers   Transfers Sit to Stand;Stand to Sit   Sit to Stand 5: Supervision   Sit to Stand Details Verbal cues for technique;Verbal cues for sequencing     Ambulation/Gait   Ambulation/Gait Yes    Ambulation/Gait Assistance 5: Supervision     Exercises   Exercises Shoulder;Knee/Hip     Knee/Hip Exercises: Aerobic   Nustep Level 5 x 15 minutes.     Shoulder Exercises: Supine   Flexion AROM;Left;20 reps     Shoulder Exercises: ROM/Strengthening   UBE (Upper Arm Bike) 90 RPM x  41min     Manual Therapy   Manual Therapy Passive ROM   Passive ROM In Sitting: AAROM for LT shldr flexion, Diagonals D1,D2 with V/Cs for posture.Scapular mobs In supine:  Left UE AAROM/ PROM with low load long duration stretching technique for Shldr, elbow and wrist                  PT Short Term Goals - 06/21/16 1355      PT SHORT TERM GOAL #1   Title Independent with an initial HEP.   Time 4   Period Weeks   Status Achieved     PT SHORT TERM GOAL #2   Title Restore full left UE PROM.   Time 4   Period Weeks   Status On-going           PT Long Term Goals - 07/29/16 1356  PT LONG TERM GOAL #1   Title Independent with an advanced HEP.   Status On-going     PT LONG TERM GOAL #2   Title Active left shoulder flexion to 145 degrees so the patient can easily reach overhead   Time 8   Period Weeks   Status On-going     PT LONG TERM GOAL #3   Title Active ER to 70 degrees+ to allow for easily donning/doffing of apparel   Time 8   Status On-going     PT LONG TERM GOAL #4   Title Increase left shoulder strength to a solid 4+/5 to increase stability for performance of functional activities   Status On-going     PT LONG TERM GOAL #5   Title Perform ADL's with no complaints.   Time 8   Period Weeks   Status On-going               Plan - 08/19/16 1518    Clinical Impression Statement Pt arrived to clinic feeling a little better today with more energy and was able to perform Exs with less assistance. He was able to raise LT UE higher today before needing AAROM and was able to move better after scapular mobs. Constant V/Cs needed during RX.   Rehab Potential  Good   Clinical Impairments Affecting Rehab Potential Pt is a fall risk, shuffeling gait pattern   PT Frequency 2x / week   PT Duration 8 weeks   PT Treatment/Interventions ADLs/Self Care Home Management;Electrical Stimulation;Moist Heat;Ultrasound;Patient/family education;Therapeutic exercise;Therapeutic activities;Manual techniques;Passive range of motion;Vasopneumatic Device;Dry needling   PT Next Visit Plan  Pt to possibly start OT  soon      Fully assess LTGs previously set; discuss with pt/sister renewal for PT to work on transfers, posture, trunk flexibility/trunk rotation for improved activation of LUE and trunk; review sit<>stand technique practiced this visit.   PT Home Exercise Plan wrist extension stretch, pt to bring UBE at next visit for further instructional use, wall push ups, wrist/finger extension stretching, walking the ball up the wall, rolling ball across the table with bilateral UE's, PROM wrist extension   Consulted and Agree with Plan of Care Patient      Patient will benefit from skilled therapeutic intervention in order to improve the following deficits and impairments:  Decreased strength, Decreased range of motion, Impaired UE functional use, Impaired tone  Visit Diagnosis: Stiffness of left shoulder, not elsewhere classified  Stiffness of left elbow, not elsewhere classified     Problem List Patient Active Problem List   Diagnosis Date Noted  . Parkinson disease ([place]) 09/05/2015  . Vitamin D deficiency 09/05/2015  . Metabolic syndrome 84/13/2440  . Hypertension 04/02/2013  . Hyperlipidemia 04/02/2013  . BPH (benign prostatic hyperplasia) 04/02/2013  . Left arm weakness 11/21/2012  . Parkinsonism ([place]) 11/21/2012  . Neck pain 11/21/2012  . Colon adenomas 06/29/2012  . Rectal mass 06/29/2012  . Schizophrenia ([place]) 06/22/2012  . Dysphagia 06/22/2011  . Eosinophilic esophagitis 01/16/2535    Dreonna Hussein,CHRIS, PTA 08/19/2016, 4:41 PM  [facility] [address] [place], [place], [address] Phone: [phone]   Fax:  [phone]  Name: Mark Benjamin MRN: [id number] Date of Birth: [birth year]

## 2016-08-23 ENCOUNTER — Ambulatory Visit (HOSPITAL_COMMUNITY): Payer: PPO | Attending: Family Medicine | Admitting: Occupational Therapy

## 2016-08-23 ENCOUNTER — Encounter (HOSPITAL_COMMUNITY): Payer: Self-pay | Admitting: Occupational Therapy

## 2016-08-23 DIAGNOSIS — R2681 Unsteadiness on feet: Secondary | ICD-10-CM | POA: Diagnosis not present

## 2016-08-23 DIAGNOSIS — R29818 Other symptoms and signs involving the nervous system: Secondary | ICD-10-CM

## 2016-08-23 DIAGNOSIS — R2689 Other abnormalities of gait and mobility: Secondary | ICD-10-CM | POA: Diagnosis not present

## 2016-08-23 DIAGNOSIS — R278 Other lack of coordination: Secondary | ICD-10-CM | POA: Insufficient documentation

## 2016-08-23 DIAGNOSIS — M6281 Muscle weakness (generalized): Secondary | ICD-10-CM

## 2016-08-23 NOTE — Therapy (Signed)
[facility], [place], [address] Phone: [phone]   Fax:  [phone]  Occupational Therapy Evaluation  Patient Details  Name: Mark Benjamin MRN: [id number] Date of Birth: 1960/09/05 No Data Recorded  Encounter Date: 08/23/2016      OT End of Session - 08/23/16 1642    Visit Number 1   Number of Visits 9   Date for OT Re-Evaluation 09/22/16   Authorization Type [employer]   Authorization Time Period Before 10th visit   Authorization - Visit Number 1   Authorization - Number of Visits 10   OT Start Time 1449   OT Stop Time 1551   OT Time Calculation (min) 62 min   Activity Tolerance Patient tolerated treatment well   Behavior During Therapy [facility] for tasks assessed/performed      Past Medical History:  Diagnosis Date  . Cataract   . Colon polyps   . Dysphagia   . Essential hypertension, benign   . Fatty liver   . Hyperplasia of prostate   . 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 Other and unspecified hyperlipidemia   . Schizophrenia [facility])     Past Surgical History:  Procedure Laterality Date  . COLONOSCOPY  02/13/08  . COLONOSCOPY N/A 07/10/2012   Procedure: COLONOSCOPY;  Surgeon: Rogene Houston, MD;  [facility];  Service: Endoscopy;  Laterality: N/A;  225  . UPPER GASTROINTESTINAL ENDOSCOPY  EGD ED   09/09/2010  . URETHRAL DILATION  1965    There were no vitals filed for this visit.      Subjective Assessment - 08/23/16 1627    Subjective  S: I can't do things as fast as I used to.    Patient is accompained by: Family member  sister-[name] (POA)   Pertinent History Pt is a 56 y/o male referred by neurorehab PT for PWR! parksinson's program to parkinson-like symptoms inhibiting B/IADL completion. Pt has been attending PT at [facility] for left arm weakness, reports minimal progress in 6 weeks. Pt was referred to occupational therapy  Dr. Redge Gainer.    Limitations PMH: Parkinsons, schizophrenia;    Special Tests FOTO 23/100 (77% impairment)   Patient Stated Goals To be able to do things for myself again.    Currently in Pain? No/denies           [facility] OT Assessment - 08/23/16 1446      Assessment   Diagnosis Weakness; Parkinson's    Prior Therapy OPPT at [facility]     Precautions   Precautions Fall     Restrictions   Weight Bearing Restrictions No     Balance Screen   Has the patient fallen in the past 6 months No   Has the patient had a decrease in activity level because of a fear of falling?  Yes   Is the patient reluctant to leave their home because of a fear of falling?  Yes     Home  Environment   Family/patient expects to be discharged to: Private residence   Living Arrangements Alone   Available Help at Discharge Personal care attendant  3x/week, 3 hours/day   Type of Home Aartment   Home Access Stairs   Alternate Level Stairs - Number of Steps 12   Bathroom Shower/Tub Tub/Shower unit   Production manager Other (comment)  dressing stick/shoe horn combination   Home Equipment  Walker - 2 wheels;Cane - single point;Shower seat;Grab bars - tub/shower;Hand held shower head   Lives With Alone     Prior Function   Level of Independence Needs assistance with ADLs;Needs assistance with homemaking  2018   Vocation On disability  since [place] watching tv, reading, walking      ADL   Eating/Feeding Modified independent   Grooming Moderate assistance  per pt report   Upper Body Bathing Maximal assistance   Lower Body Bathing Maximal assistance   Upper Body Dressing Moderate assistance  due to LUE limitations; 25 min to donn shirt today   Lower Body Dressing Moderate assistance  took extra time   Toilet Transfer Modified independent   Toileting - Clothing Manipulation Modified independent;Increased time   Toileting -  Hygiene Modified Independent;Increase  time   Toileting - Hygiene Details ( indicate cue type & reason Pt and sister report occasional episodes of incontinence    Tub/Shower Transfer Moderate assistance   Archivist seat with back;Grab bars;Increased time   ADL comments Pt demonstrates bradykinesia during evaluation-requiring significantly increased time for task completion. Pt has an aide 3x/week for 3 hrs/day who assists with bathing, dressing, and all housework tasks. Pt is alone when aide is not present, is able to complete ADL tasks with increased time.       Mobility   Mobility Status Freezing   Mobility Status Comments sister reports occasional episodes of freezing-not observed during evaluation     Written Expression   Dominant Hand Left   Handwriting Increased time     Vision - History   Baseline Vision Wears glasses all the time     Coordination   Gross Motor Movements are Fluid and Coordinated No   Fine Motor Movements are Fluid and Coordinated No   9 Hole Peg Test Right;Left   Right 9 Hole Peg Test 42"   Left 9 Hole Peg Test 1'21"   Tremors No tremors noted during evaluation, pt reports occasional tremors in LUE     ROM / Strength   AROM / PROM / Strength AROM;PROM;Strength     AROM   Overall AROM Comments Assessed in standing, RUE is [facility]   AROM Assessment Site Shoulder   Right/Left Shoulder Left   Left Shoulder Flexion 95 Degrees   Left Shoulder ABduction 60 Degrees   Left Shoulder Internal Rotation 90 Degrees   Left Shoulder External Rotation 38 Degrees     PROM   Overall PROM Comments P/ROM is [facility]     Strength   Overall Strength Comments Assessed in sitting, er/IR adducted   Strength Assessment Site Shoulder;Hand   Right/Left Shoulder Right;Left   Right Shoulder Flexion 3+/5   Right Shoulder ABduction 3/5   Right Shoulder Internal Rotation 3/5   Right Shoulder External Rotation 3/5   Left Shoulder Flexion 3-/5   Left Shoulder ABduction 3-/5   Left Shoulder Internal  Rotation 3/5   Left Shoulder External Rotation 3-/5   Right/Left hand Right;Left   Right Hand Grip (lbs) 86   Right Hand Lateral Pinch 26 lbs   Right Hand 3 Point Pinch 20 lbs   Left Hand Grip (lbs) 24   Left Hand Lateral Pinch 15 lbs   Left Hand 3 Point Pinch 6 lbs     LUE Tone   LUE Tone Mild                    PWR ([facility]) - 08/23/16  1640    PWR! exercises Moves in sitting   PWR! Up 5 reps   PWR! Rock 5 reps   PWR! Twist 5 reps   PWR! Step 5 reps   Comments OT mirroring PWR! moves, pt requires verbal and tactile cuing for technique and amplitude             OT Education - 08/23/16 1639    Education provided Yes   Education Details Grip and pinch strengthening with theraputty; educated pt and caregiver on PWR! program and benefits of OT and/or PT in regards to function and mobility   Person(s) Educated Patient;Other (comment)  sister-[name]   Methods Explanation;Demonstration;Handout   Comprehension Verbalized understanding;Returned demonstration          OT Short Term Goals - 08/23/16 1701      OT SHORT TERM GOAL #1   Title Pt will benefit from HEP to improve mobility and functioning required for ADL completion.    Time 4   Period Weeks   Status New     OT SHORT TERM GOAL #2   Title Pt will improve LUE strength to improve ability to perform overhead reaching using LUE as dominant.    Time 4   Period Weeks   Status New     OT SHORT TERM GOAL #3   Title Pt will improve left grip strength by 15# and pinch strength by 3# to improve ability to grasp and hold cups and utensils.   Time 4   Period Weeks   Status New     OT SHORT TERM GOAL #4   Title Pt will improve ability to button shirts and tie shoes by completing 9 hole peg test in 1 min or less.    Time 4   Period Weeks   Status New     OT SHORT TERM GOAL #5   Title Pt will complete ADL tasks-grooming, dressing, toilet hygiene-in a timely manner by donning shirts in 10 min or less.     Time 4   Period Weeks   Status New     Additional Short Term Goals   Additional Short Term Goals Yes     OT SHORT TERM GOAL #6   Title Pt will improve LUE strength to 4/5 to improve ability to perform ADL tasks while using LUE as dominant.    Time 4   Period Weeks   Status New     OT SHORT TERM GOAL #7   Title Pt will complete functional reaching tasks with improved mobility taking 2 steps or less towards an object at distance of 3 feet away.    Time 4   Period Weeks   Status New                  Plan - 08/23/16 1642    Clinical Impression Statement A: Pt is a 56 y/o male referred to OT at [facility] by PT at neurorehab specifically to for PWR! program to work on improved independence and function with ADL completion. Pt presents with bradykinesia, rigidity of LUE and LLE, abnormal posture, decreased timing and coordination with all task completion, decreased flexibility inhibiting safety and independence in ADL and functional mobility tasks.    Occupational Profile and client history currently impacting functional performance Pt lives alone in an apartment, is motivated to improve and maintain ability to live independently, currently requires assistance of aide for B/IADL completion and has not driven in three months.    Occupational performance  deficits (Please refer to evaluation for details): ADL's;IADL's;Leisure;Social Participation   Rehab Potential Good   OT Frequency 2x / week   OT Duration 4 weeks   OT Treatment/Interventions Self-care/ADL training;Therapeutic exercise;Patient/family education;Manual Therapy;Neuromuscular education;DME and/or AE instruction;Therapeutic activities;Electrical Stimulation;Passive range of motion;Moist Heat   Plan P: Pt will benefit from OT services specifically PWR! based training to improve full body mobility focused on improving functioning required for safety and independence in ADL completion. Treatment plan: PWR! based exercises, LUE  strengthening, grip and pinch strengthening, gross and fine motor coordination training, energy conservation strategies. Discussed PWR! PT with pt as well, pt and sister will pursue PT here at [facility] as well.    Clinical Decision Making Several treatment options, min-mod task modification necessary   OT Home Exercise Plan red theraputty for grip and pinch strengthening   Consulted and Agree with Plan of Care Patient      Patient will benefit from skilled therapeutic intervention in order to improve the following deficits and impairments:  Decreased endurance, Decreased activity tolerance, Decreased knowledge of use of DME, Decreased strength, Impaired flexibility, Decreased balance, Decreased mobility, Pain, Impaired tone, Decreased range of motion, Decreased coordination, Decreased safety awareness, Increased fascial restricitons, Impaired UE functional use  Visit Diagnosis: Muscle weakness (generalized) - Plan: Ot plan of care cert/re-cert  Other symptoms and signs involving the nervous system - Plan: Ot plan of care cert/re-cert  Other lack of coordination - Plan: Ot plan of care cert/re-cert      G-Codes - 63/33/54 1712    Functional Assessment Tool Used (Outpatient only) FOTO Score: 23/100 (77% impairment)   Functional Limitation Self care   Self Care Current Status (T6256) At least 60 percent but less than 80 percent impaired, limited or restricted   Self Care Goal Status (L8937) At least 40 percent but less than 60 percent impaired, limited or restricted      Problem List Patient Active Problem List   Diagnosis Date Noted  . Parkinson disease ([place]) 09/05/2015  . Vitamin D deficiency 09/05/2015  . Metabolic syndrome 34/28/7681  . Hypertension 04/02/2013  . Hyperlipidemia 04/02/2013  . BPH (benign prostatic hyperplasia) 04/02/2013  . Left arm weakness 11/21/2012  . Parkinsonism ([place]) 11/21/2012  . Neck pain 11/21/2012  . Colon adenomas 06/29/2012  . Rectal mass  06/29/2012  . Schizophrenia ([place]) 06/22/2012  . Dysphagia 06/22/2011  . Eosinophilic esophagitis 15/72/6203   Guadelupe Sabin, OTR/L  [phone] 08/23/2016, 5:16 PM  [facility] [address] [place], [place], [address] Phone: [phone]   Fax:  [phone]  Name: Mark Benjamin MRN: [id number] Date of Birth: 22-Jan-1961

## 2016-08-23 NOTE — Patient Instructions (Signed)
Home Exercises Program Theraputty Exercises  Do the following exercises 1-2 times a day using your affected hand.  1. Roll putty into a ball.  2. Make into a pancake.  3. Roll putty into a roll.  4. Pinch along log with first finger and thumb.   5. Make into a ball.  6. Roll it back into a log.   7. Pinch using thumb and side of first finger.  8. Roll into a ball, then flatten into a pancake.

## 2016-08-24 ENCOUNTER — Encounter: Payer: PPO | Admitting: Physical Therapy

## 2016-08-26 ENCOUNTER — Encounter: Payer: PPO | Admitting: *Deleted

## 2016-08-30 ENCOUNTER — Ambulatory Visit (HOSPITAL_COMMUNITY): Payer: PPO | Admitting: Occupational Therapy

## 2016-08-30 ENCOUNTER — Encounter (HOSPITAL_COMMUNITY): Payer: Self-pay | Admitting: Occupational Therapy

## 2016-08-30 ENCOUNTER — Telehealth: Payer: Self-pay | Admitting: Family Medicine

## 2016-08-30 DIAGNOSIS — M6281 Muscle weakness (generalized): Secondary | ICD-10-CM

## 2016-08-30 DIAGNOSIS — R29818 Other symptoms and signs involving the nervous system: Secondary | ICD-10-CM

## 2016-08-30 DIAGNOSIS — R278 Other lack of coordination: Secondary | ICD-10-CM

## 2016-08-30 NOTE — Telephone Encounter (Signed)
Spoke with [name]  - she wants a note from [place] stating something to the effect : pt needs companion care to help his quality of life - due to his psychological condition, parkinson's and depression. Also note that he may in the near future be needing Skilled care.   [name] says we can call Domenica Fail (cheif nurse with nursing and aging facility of [facility]. [phone] Or herself with any questions   He is currently getting this care but is paying out of pocket. With a note = he may be able to get some reimbursement.

## 2016-08-30 NOTE — Telephone Encounter (Signed)
May give note to patient's sister requesting these needs for this patient because of his medical condition and diagnosis

## 2016-08-30 NOTE — Therapy (Signed)
[place], [place], [address] Phone: [phone]   Fax:  [phone]  Occupational Therapy Treatment  Patient Details  Name: Mark Benjamin MRN: [id number] Date of Birth: February 23, 1961 No Data Recorded  Encounter Date: 08/30/2016      OT End of Session - 08/30/16 1216    Visit Number 2   Number of Visits 9   Date for OT Re-Evaluation 09/22/16   Authorization Type [employer]   Authorization Time Period Before 10th visit   Authorization - Visit Number 2   Authorization - Number of Visits 10   OT Start Time [phone]   OT Stop Time 1020   OT Time Calculation (min) 56 min   Activity Tolerance Patient tolerated treatment well   Behavior During Therapy [facility] for tasks assessed/performed      Past Medical History:  Diagnosis Date  . Cataract   . Colon polyps   . Dysphagia   . Essential hypertension, benign   . Fatty liver   . Hyperplasia of prostate   . 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 Other and unspecified hyperlipidemia   . Schizophrenia [facility])     Past Surgical History:  Procedure Laterality Date  . COLONOSCOPY  02/13/08  . COLONOSCOPY N/A 07/10/2012   Procedure: COLONOSCOPY;  Surgeon: Rogene Houston, MD;  [facility];  Service: Endoscopy;  Laterality: N/A;  225  . UPPER GASTROINTESTINAL ENDOSCOPY  EGD ED   09/09/2010  . URETHRAL DILATION  1965    There were no vitals filed for this visit.      Subjective Assessment - 08/30/16 0930    Subjective  S: I have trouble getting out of my bed.    Currently in Pain? No/denies            [facility] OT Assessment - 08/30/16 0930      Assessment   Diagnosis Weakness; Parkinson's      Precautions   Precautions Fall                  OT Treatments/Exercises (OP) - 08/30/16 0930      Bed Mobility   Bed Mobility Sit to Supine;Supine to Sit   Supine to Sit 4: Min assist;HOB flat   Supine to Sit  Details (indicate cue type and reason) Min assist to bring BLE to EOB   Sit to Supine 5: Supervision;HOB flat;Other (comment)   Sit to Supine - Details (indicate cue type and reason) Pt requires increased time for processing and sequencing     Transfers   Transfers Sit to Stand;Stand to Sit   Sit to Stand 5: Supervision   Stand to Sit 5: Supervision     Exercises   Exercises Neurological Re-education;Shoulder     Shoulder Exercises: Supine   Protraction AROM;10 reps   Protraction Limitations OT providing target for straightening elbow/arms completely, verbalizing 2 count beat    Horizontal ABduction AROM;10 reps   Horizontal ABduction Limitations OT providing target for straightening elbow/arms completely, verbalizing 2 count beat    ABduction AROM;10 reps   ABduction Limitations OT providing target for straightening elbow/arms completely, verbalizing 2 count beat      Neurological Re-education Exercises   Finger Flexion 10X   Finger Extension 10X   Other Exercises 1 Starfish stretch-full body extension supine, 2'   Hand Gripper with Large Beads all beads gripper at 20#   Hand Gripper with Medium Beads  5 beads gripper at 20#           PWR [facility]) - 08/30/16 0943    PWR! exercises Moves in sitting   PWR! Up 10 reps-flick   PWR! Up 10 reps   PWR! Rock 10 reps   PWR! Twist 10 reps   PWR! Step 10 reps   Comments OT mirroring PWR! moves, providing visual and tactile cuing for technique. OT counting off beats for pt amplitude, pt responded well                OT Short Term Goals - 08/30/16 1216      OT SHORT TERM GOAL #1   Title Pt will benefit from HEP to improve mobility and functioning required for ADL completion.    Time 4   Period Weeks   Status On-going     OT SHORT TERM GOAL #2   Title Pt will improve LUE strength to improve ability to perform overhead reaching using LUE as dominant.    Time 4   Period Weeks   Status On-going     OT SHORT TERM GOAL #3    Title Pt will improve left grip strength by 15# and pinch strength by 3# to improve ability to grasp and hold cups and utensils.   Time 4   Period Weeks   Status On-going     OT SHORT TERM GOAL #4   Title Pt will improve ability to button shirts and tie shoes by completing 9 hole peg test in 1 min or less.    Time 4   Period Weeks   Status On-going     OT SHORT TERM GOAL #5   Title Pt will complete ADL tasks-grooming, dressing, toilet hygiene-in a timely manner by donning shirts in 10 min or less.    Time 4   Period Weeks   Status On-going     OT SHORT TERM GOAL #6   Title Pt will improve LUE strength to 4/5 to improve ability to perform ADL tasks while using LUE as dominant.    Time 4   Period Weeks   Status On-going     OT SHORT TERM GOAL #7   Title Pt will complete functional reaching tasks with improved mobility taking 2 steps or less towards an object at distance of 3 feet away.    Time 4   Period Weeks   Status On-going                  Plan - 08/30/16 1227    Clinical Impression Statement A: Initiated PWR! moves, LUE A/ROM, LUE grip strengthening this session. Pt presents today with slight edema, discoloration, and shine of left hand, MCP and IP stiffness however is able to make a complete fist; very similar to presentation of CVA symptoms. During PWR! exercises today pt did well with OT mirroring and counting 2 or 4 beat count for movements. At one point pt noted he was trying to place his LUE in his lap from his side, was unable to complete motion voilitionally, however when OT counted and mirrored pt was able to complete task.    Plan P: Attempt PWR! supine, continue with extension stretch, provide fine motor exercise for HEP; add UBE in reverse   OT Home Exercise Plan red theraputty for grip and pinch strengthening   Consulted and Agree with Plan of Care Patient      Patient will benefit from skilled therapeutic intervention in order to improve  the  following deficits and impairments:  Decreased endurance, Decreased activity tolerance, Decreased knowledge of use of DME, Decreased strength, Impaired flexibility, Decreased balance, Decreased mobility, Pain, Impaired tone, Decreased range of motion, Decreased coordination, Decreased safety awareness, Increased fascial restricitons, Impaired UE functional use  Visit Diagnosis: Muscle weakness (generalized)  Other symptoms and signs involving the nervous system  Other lack of coordination    Problem List Patient Active Problem List   Diagnosis Date Noted  . Parkinson disease ([place]) 09/05/2015  . Vitamin D deficiency 09/05/2015  . Metabolic syndrome 23/55/7322  . Hypertension 04/02/2013  . Hyperlipidemia 04/02/2013  . BPH (benign prostatic hyperplasia) 04/02/2013  . Left arm weakness 11/21/2012  . Parkinsonism ([place]) 11/21/2012  . Neck pain 11/21/2012  . Colon adenomas 06/29/2012  . Rectal mass 06/29/2012  . Schizophrenia ([place]) 06/22/2012  . Dysphagia 06/22/2011  . Eosinophilic esophagitis 02/54/2706   Guadelupe Sabin, OTR/L  [phone] 08/30/2016, 12:35 PM  [place] [address] [place], [place], [address] Phone: [phone]   Fax:  [phone]  Name: Mark Benjamin MRN: [id number] Date of Birth: 11/22/1960

## 2016-08-31 ENCOUNTER — Encounter: Payer: PPO | Admitting: Physical Therapy

## 2016-08-31 ENCOUNTER — Telehealth: Payer: Self-pay | Admitting: Diagnostic Neuroimaging

## 2016-08-31 NOTE — Telephone Encounter (Signed)
Pt's sister called she needs to know if Dr Mamie Nick is wanting to see him every 3 mths. He gets credit for copay for each office visit. She said if it is every 3 mths it would be beneficial but if it is twice a year then it will probably will not make a difference. Please call to discuss

## 2016-08-31 NOTE — Telephone Encounter (Signed)
Attempted to reach patient's sister, [name] on [place]. Phone was answered but no one spoke.

## 2016-09-01 NOTE — Telephone Encounter (Signed)
LVM with number requesting call back.

## 2016-09-01 NOTE — Telephone Encounter (Signed)
Received call from nNncy, pt's sister. She inquired if Dr Leta Baptist would likely need to see patient at least four times in the upcoming year. Patient is in [place] and sometimes receives benefits from co pays during the year. His lease is coming up for renewal, so [name] needs this information. This RN reviewed last two office notes; and advised [name] that after seeing patient for next appointment he will most likely release him back to his PCP as he did in 2017. Dr Leta Baptist would be glad to see patient again in future if necessary.  [name] then stated that if patient has to move into assisted living, Dr Leta Baptist had said he would write a letter of necessity, so patient would be released from current lease. This RN advised she notify office if letter is needed with any details needed in the letter. [name] verbalized understanding, appreciation.

## 2016-09-02 ENCOUNTER — Encounter: Payer: Self-pay | Admitting: *Deleted

## 2016-09-02 ENCOUNTER — Encounter: Payer: PPO | Admitting: Physical Therapy

## 2016-09-03 ENCOUNTER — Encounter (HOSPITAL_COMMUNITY): Payer: Self-pay | Admitting: Occupational Therapy

## 2016-09-03 ENCOUNTER — Ambulatory Visit (HOSPITAL_COMMUNITY): Payer: PPO | Admitting: Occupational Therapy

## 2016-09-03 DIAGNOSIS — M6281 Muscle weakness (generalized): Secondary | ICD-10-CM

## 2016-09-03 DIAGNOSIS — R29818 Other symptoms and signs involving the nervous system: Secondary | ICD-10-CM

## 2016-09-03 DIAGNOSIS — R278 Other lack of coordination: Secondary | ICD-10-CM

## 2016-09-03 NOTE — Therapy (Signed)
[place], [place], [address] Phone: [phone]   Fax:  [phone]  Occupational Therapy Treatment  Patient Details  Name: Mark Benjamin MRN: [id number] Date of Birth: August 20, 1960 No Data Recorded  Encounter Date: 09/03/2016      OT End of Session - 09/03/16 1321    Visit Number 3   Number of Visits 9   Date for OT Re-Evaluation 09/22/16   Authorization Type [employer]   Authorization Time Period Before 10th visit   Authorization - Visit Number 3   Authorization - Number of Visits 10   OT Start Time [phone]   OT Stop Time 1031   OT Time Calculation (min) 49 min   Activity Tolerance Patient tolerated treatment well   Behavior During Therapy [facility] for tasks assessed/performed      Past Medical History:  Diagnosis Date  . Cataract   . Colon polyps   . Dysphagia   . Essential hypertension, benign   . Fatty liver   . Hyperplasia of prostate   . 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 Other and unspecified hyperlipidemia   . Schizophrenia [facility])     Past Surgical History:  Procedure Laterality Date  . COLONOSCOPY  02/13/08  . COLONOSCOPY N/A 07/10/2012   Procedure: COLONOSCOPY;  Surgeon: Rogene Houston, MD;  [facility];  Service: Endoscopy;  Laterality: N/A;  225  . UPPER GASTROINTESTINAL ENDOSCOPY  EGD ED   09/09/2010  . URETHRAL DILATION  1965    There were no vitals filed for this visit.      Subjective Assessment - 09/03/16 0940    Subjective  S: My hand got like this in the past year.    Currently in Pain? No/denies            [facility] OT Assessment - 09/03/16 0939      Assessment   Diagnosis Weakness; Parkinson's      Precautions   Precautions Fall                  OT Treatments/Exercises (OP) - 09/03/16 0949      Bed Mobility   Bed Mobility Sit to Supine;Supine to Sit   Supine to Sit 5: Supervision;HOB flat   Sit to Supine  5: Supervision;HOB flat;Other (comment)     ADLs   UB Dressing Pt donned button-up shirt this session with min assist from OT due to LUE limitations. Pt educated on button hook use this session. Pt was able to successfully button 3 buttons after initial instructions with increased time using button hook. Attempted without button hook and was unable to button any buttons. Pt then doffed shirt with min assist from OT due to bradykinesia and LUE limitations.      Exercises   Exercises Neurological Re-education;Shoulder     Neurological Re-education Exercises   Finger Flexion 10X   Finger Extension 10X   Other Exercises 1 Starfish stretch-full body extension supine, 3'. LUE with flexor tone throughout, decreasing and relaxing to mat at end of stretch         09/03/16 0951  Moves in Supine  PWR! Up 10 reps-cuing for large motion  PWR! Rock 10 reps-cone used as target to facilitate weight shifting and reaching  PWR! Twist 10 reps   PWR! Step 10 reps  Comments OT counting 2 beat rhytm to maintain amplitude   Hands  PWR! Up 10 reps-flick  PWR! Rock 10 reps-twist  PWR! Twist 10 reps-open hands          OT Education - 09/03/16 1034    Education provided Yes   Education Details hand and finger A/ROM for left hand   Person(s) Educated Patient   Methods Explanation;Demonstration;Handout   Comprehension Verbalized understanding;Returned demonstration          OT Short Term Goals - 08/30/16 1216      OT SHORT TERM GOAL #1   Title Pt will benefit from HEP to improve mobility and functioning required for ADL completion.    Time 4   Period Weeks   Status On-going     OT SHORT TERM GOAL #2   Title Pt will improve LUE strength to improve ability to perform overhead reaching using LUE as dominant.    Time 4   Period Weeks   Status On-going     OT SHORT TERM GOAL #3   Title Pt will improve left grip strength by 15# and pinch strength by 3# to improve ability to grasp and hold  cups and utensils.   Time 4   Period Weeks   Status On-going     OT SHORT TERM GOAL #4   Title Pt will improve ability to button shirts and tie shoes by completing 9 hole peg test in 1 min or less.    Time 4   Period Weeks   Status On-going     OT SHORT TERM GOAL #5   Title Pt will complete ADL tasks-grooming, dressing, toilet hygiene-in a timely manner by donning shirts in 10 min or less.    Time 4   Period Weeks   Status On-going     OT SHORT TERM GOAL #6   Title Pt will improve LUE strength to 4/5 to improve ability to perform ADL tasks while using LUE as dominant.    Time 4   Period Weeks   Status On-going     OT SHORT TERM GOAL #7   Title Pt will complete functional reaching tasks with improved mobility taking 2 steps or less towards an object at distance of 3 feet away.    Time 4   Period Weeks   Status On-going                  Plan - 09/03/16 1321    Clinical Impression Statement A: PWR! moves performed in supine this session, pt did very well with OT counting 1-2 beat for fluid motion. Pt became fatigued at end and required encouragement to maintain rhythm. Pt used mirror for dressing task to practice button hook, improvement in independence compared to buttoning with fingers.    Plan P: Continue with PWR! supine, review HEP, work on handwriting adaptations   [place] red theraputty for grip and pinch strengthening; 6/15: hand and finger A/ROM   Consulted and Agree with Plan of Care Patient;Family member/caregiver   Family Member Consulted sister-[name]      Patient will benefit from skilled therapeutic intervention in order to improve the following deficits and impairments:  Decreased endurance, Decreased activity tolerance, Decreased knowledge of use of DME, Decreased strength, Impaired flexibility, Decreased balance, Decreased mobility, Pain, Impaired tone, Decreased range of motion, Decreased coordination, Decreased safety awareness,  Increased fascial restricitons, Impaired UE functional use  Visit Diagnosis: Muscle weakness (generalized)  Other symptoms and signs involving the nervous system  Other lack of coordination    Problem List Patient Active Problem List  Diagnosis Date Noted  . Parkinson disease ([place]) 09/05/2015  . Vitamin D deficiency 09/05/2015  . Metabolic syndrome 22/97/9892  . Hypertension 04/02/2013  . Hyperlipidemia 04/02/2013  . BPH (benign prostatic hyperplasia) 04/02/2013  . Left arm weakness 11/21/2012  . Parkinsonism ([place]) 11/21/2012  . Neck pain 11/21/2012  . Colon adenomas 06/29/2012  . Rectal mass 06/29/2012  . Schizophrenia ([place]) 06/22/2012  . Dysphagia 06/22/2011  . Eosinophilic esophagitis 11/94/1740   Guadelupe Sabin, OTR/L  [phone] 09/03/2016, 1:27 PM  [facility] [address] [place], [place], [address] Phone: [phone]   Fax:  [phone]  Name: CHRISTOPHR CALIX MRN: [id number] Date of Birth: 1960-09-25

## 2016-09-03 NOTE — Patient Instructions (Signed)
Hand and Finger Exercises  AROM: Finger Flexion / Extension   Actively bend fingers of right hand. Start with knuckles furthest from palm, and slowly make a fist.  Relax. Then straighten fingers as far as possible.Use right hand to assist in bending knuckles if necessary.  Repeat _10___ times per set. Do __1__ sets per session. Do _1-2___ sessions per day.  Copyright  VHI. All rights reserved.   Towel Crumpling Exercise   Begin with right palm down on washcloth. Maintaining contact between surface and heel of hand, crumple paper into a ball. Repeat __10__ times per set. Do _1___ sets per session. Do __1-2__ sessions per day.  Copyright  VHI. All rights reserved.     Towel Roll Squeeze   With right forearm resting on surface, gently squeeze towel. Repeat __10__ times per set. Do _1___ sets per session. Do __1-2__ sessions per day.  Copyright  VHI. All rights reserved.   Abduction / Adduction (Active)    With hand flat on table, spread all fingers apart, then bring them together as close as possible. Repeat __10__ times. Do __1-2__ sessions per day.  Copyright  VHI. All rights reserved.  AROM: Thumb Abduction / Adduction   Actively pull right thumb away from palm as far as possible. Then bring thumb back to touch fingers. Try not to bend fingers toward thumb. Repeat __10__ times per set. Do _1___ sets per session. Do __1-2__ sessions per day.  Copyright  VHI. All rights reserved.

## 2016-09-07 ENCOUNTER — Encounter: Payer: PPO | Admitting: Physical Therapy

## 2016-09-08 ENCOUNTER — Encounter (HOSPITAL_COMMUNITY): Payer: Self-pay | Admitting: Physical Therapy

## 2016-09-08 ENCOUNTER — Ambulatory Visit (HOSPITAL_COMMUNITY): Payer: PPO | Admitting: Physical Therapy

## 2016-09-08 ENCOUNTER — Encounter (HOSPITAL_COMMUNITY): Payer: Self-pay | Admitting: Occupational Therapy

## 2016-09-08 ENCOUNTER — Ambulatory Visit (HOSPITAL_COMMUNITY): Payer: PPO | Admitting: Occupational Therapy

## 2016-09-08 DIAGNOSIS — R278 Other lack of coordination: Secondary | ICD-10-CM

## 2016-09-08 DIAGNOSIS — M6281 Muscle weakness (generalized): Secondary | ICD-10-CM

## 2016-09-08 DIAGNOSIS — R29818 Other symptoms and signs involving the nervous system: Secondary | ICD-10-CM

## 2016-09-08 DIAGNOSIS — R2689 Other abnormalities of gait and mobility: Secondary | ICD-10-CM

## 2016-09-08 DIAGNOSIS — R2681 Unsteadiness on feet: Secondary | ICD-10-CM

## 2016-09-08 NOTE — Therapy (Signed)
[place], [place], [address] Phone: [phone]   Fax:  [phone]  Occupational Therapy Treatment  Patient Details  Name: RUDELL ORTMAN MRN: [id number] Date of Birth: November 01, 1960 No Data Recorded  Encounter Date: 09/08/2016      OT End of Session - 09/08/16 1623    Visit Number 4   Number of Visits 9   Date for OT Re-Evaluation 09/22/16   Authorization Type [employer]   Authorization Time Period Before 10th visit   Authorization - Visit Number 4   Authorization - Number of Visits 10   OT Start Time 1430   OT Stop Time 1515   OT Time Calculation (min) 45 min   Activity Tolerance Patient tolerated treatment well   Behavior During Therapy [facility] for tasks assessed/performed      Past Medical History:  Diagnosis Date  . Cataract   . Colon polyps   . Dysphagia   . Essential hypertension, benign   . Fatty liver   . Hyperplasia of prostate   . 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 Other and unspecified hyperlipidemia   . Schizophrenia [facility])     Past Surgical History:  Procedure Laterality Date  . COLONOSCOPY  02/13/08  . COLONOSCOPY N/A 07/10/2012   Procedure: COLONOSCOPY;  Surgeon: Rogene Houston, MD;  [facility];  Service: Endoscopy;  Laterality: N/A;  225  . UPPER GASTROINTESTINAL ENDOSCOPY  EGD ED   09/09/2010  . URETHRAL DILATION  1965    There were no vitals filed for this visit.      Subjective Assessment - 09/08/16 1612    Subjective  S: My thumb feels tight like it won't move. (during handwriting)   Currently in Pain? No/denies            [facility] OT Assessment - 09/08/16 1611      Assessment   Diagnosis Weakness; Parkinson's      Precautions   Precautions Fall                  OT Treatments/Exercises (OP) - 09/08/16 1614      ADLs   Writing Pt completed handwriting exercise with regular ballpoint pen and pen with  built up handle. Pt wrote his name and address with approximately 25% legibility. Difficulty with motion required from dominant LUE. Handwriting was better with regular pen than with built-up pen.      Exercises   Exercises Neurological Re-education;Shoulder     Neurological Re-education Exercises   Finger Flexion 10X   Finger Extension 10X   Other Exercises 1 Scrubbing protocol initiated this session-pt alternating scrubbing in standing with weight on LUE for 1 minute, and carrying 1# weight for 1 minute, 3X           PWR ([facility]) - 09/08/16 1612    Basic 4 Flow 5 reps   Comments OT providing visual and verbal cuing-counting beats for pt amplitude. Pt with mod/max difficulty with flow sequence                OT Short Term Goals - 08/30/16 1216      OT SHORT TERM GOAL #1   Title Pt will benefit from HEP to improve mobility and functioning required for ADL completion.    Time 4   Period Weeks   Status On-going     OT SHORT TERM GOAL #2   Title Pt will improve  LUE strength to improve ability to perform overhead reaching using LUE as dominant.    Time 4   Period Weeks   Status On-going     OT SHORT TERM GOAL #3   Title Pt will improve left grip strength by 15# and pinch strength by 3# to improve ability to grasp and hold cups and utensils.   Time 4   Period Weeks   Status On-going     OT SHORT TERM GOAL #4   Title Pt will improve ability to button shirts and tie shoes by completing 9 hole peg test in 1 min or less.    Time 4   Period Weeks   Status On-going     OT SHORT TERM GOAL #5   Title Pt will complete ADL tasks-grooming, dressing, toilet hygiene-in a timely manner by donning shirts in 10 min or less.    Time 4   Period Weeks   Status On-going     OT SHORT TERM GOAL #6   Title Pt will improve LUE strength to 4/5 to improve ability to perform ADL tasks while using LUE as dominant.    Time 4   Period Weeks   Status On-going     OT SHORT TERM GOAL #7    Title Pt will complete functional reaching tasks with improved mobility taking 2 steps or less towards an object at distance of 3 feet away.    Time 4   Period Weeks   Status On-going                  Plan - 09/08/16 1623    Clinical Impression Statement A: Pt appointment in afternoon for first time-notable increase in symptoms and freezing this session. Handwriting activity attempted this session, pt with max difficulty due to limited mobility and bradykinesia affecting LUE. LUE continues to present with rigidity at wrist, discoloration including shiny skin on hand, swelling to hand and digits, and PIP/DIP joints at borderline hyperextension. Pt reports there is no pain or sensation changes in his left hand. Initiated scrub and carry protocol this session to promote stress loading and weight bearing to the LUE.    Plan P: Provide scrub and carry protocol for home. Continue with PWR! moves, resume supine    OT Home Exercise Plan red theraputty for grip and pinch strengthening; 6/15: hand and finger A/ROM   Consulted and Agree with Plan of Care Patient      Patient will benefit from skilled therapeutic intervention in order to improve the following deficits and impairments:  Decreased endurance, Decreased activity tolerance, Decreased knowledge of use of DME, Decreased strength, Impaired flexibility, Decreased balance, Decreased mobility, Pain, Impaired tone, Decreased range of motion, Decreased coordination, Decreased safety awareness, Increased fascial restricitons, Impaired UE functional use  Visit Diagnosis: Muscle weakness (generalized)  Other symptoms and signs involving the nervous system  Other lack of coordination    Problem List Patient Active Problem List   Diagnosis Date Noted  . Parkinson disease ([place]) 09/05/2015  . Vitamin D deficiency 09/05/2015  . Metabolic syndrome 16/12/9602  . Hypertension 04/02/2013  . Hyperlipidemia 04/02/2013  . BPH (benign prostatic  hyperplasia) 04/02/2013  . Left arm weakness 11/21/2012  . Parkinsonism ([place]) 11/21/2012  . Neck pain 11/21/2012  . Colon adenomas 06/29/2012  . Rectal mass 06/29/2012  . Schizophrenia ([place]) 06/22/2012  . Dysphagia 06/22/2011  . Eosinophilic esophagitis 54/11/8117   Guadelupe Sabin, OTR/L  [phone] 09/08/2016, 4:27 PM  [facility]  [place], [place], [address] Phone: [phone]   Fax:  [phone]  Name: TREMONT GAVITT MRN: [id number] Date of Birth: 1960-12-20

## 2016-09-08 NOTE — Therapy (Addendum)
[place], [place], [address] Phone: [phone]   Fax:  [phone]  Physical Therapy Re-Evaluation  Patient Details  Name: CAMERYN CHRISLEY MRN: [id number] Date of Birth: March 11, 1961 Referring Provider: Redge Gainer   Encounter Date: 09/08/2016      PT End of Session - 09/08/16 1754    Visit Number 19   Number of Visits 35   Date for PT Re-Evaluation 10/06/16   Authorization Type [employer]    Authorization Time Period 09/08/16 to 11/08/16   Authorization - Visit Number 19   Authorization - Number of Visits 29   PT Start Time 6962  patient arrived late    PT Stop Time 1427   PT Time Calculation (min) 32 min   Activity Tolerance Patient tolerated treatment well   Behavior During Therapy [facility] for tasks assessed/performed      Past Medical History:  Diagnosis Date  . Cataract   . Colon polyps   . Dysphagia   . Essential hypertension, benign   . Fatty liver   . Hyperplasia of prostate   . 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 Other and unspecified hyperlipidemia   . Schizophrenia [facility])     Past Surgical History:  Procedure Laterality Date  . COLONOSCOPY  02/13/08  . COLONOSCOPY N/A 07/10/2012   Procedure: COLONOSCOPY;  Surgeon: Rogene Houston, MD;  Location: [facility];  Service: Endoscopy;  Laterality: N/A;  225  . UPPER GASTROINTESTINAL ENDOSCOPY  EGD ED   09/09/2010  . URETHRAL DILATION  1965    There were no vitals filed for this visit.       Subjective Assessment - 09/08/16 1358    Subjective Pateint arrives stating that he was being seen at [facility] clinic for his shoulders, then went to neurorehab and was referred here for [facility] training. He does have  some  difficulty with weakness in his L leg and UE, as well as general unsteadiness. Neuro rehab did give him some exercises and they are going OK.    Pertinent History Parkinson.  Schizophrenia.   Patient  Stated Goals get up from chairs better too, get moving aroudn better    Currently in Pain? No/denies            [facility] PT Assessment - 09/08/16 0001      Assessment   Medical Diagnosis Parkinsons    Referring Provider Redge Gainer    Onset Date/Surgical Date --  parkinsons started in 2013   Next MD Visit Dr. Laurance Flatten in July    Prior Therapy PT at [facility], Ella Bodo PT eval at Neuro rehab [place]   Has the patient fallen in the past 6 months No   Has the patient had a decrease in activity level because of a fear of falling?  No   Is the patient reluctant to leave their home because of a fear of falling?  No     Prior Function   Level of Independence Needs assistance with ADLs;Needs assistance with homemaking   Vocation On disability   Leisure watching tv, reading, walking      Strength   Right Hip Flexion 3+/5   Right Hip ABduction 5/5   Left Hip Flexion 3+/5   Left Hip ABduction 5/5   Right Knee Flexion 4/5   Right Knee Extension 5/5  Left Knee Flexion 4+/5   Left Knee Extension 4/5   Right Ankle Dorsiflexion 5/5   Left Ankle Dorsiflexion 5/5     Bed Mobility   Supine to Sit 2: Max assist  narrow table    Sit to Supine 4: Min guard     Transfers   Five time sit to stand comments  35.7 seconds UEs on thights      6 minute walk test results    Aerobic Endurance Distance Walked 502   Endurance additional comments 3MWT      High Level Balance   High Level Balance Comments TUG 17.6            Objective measurements completed on examination: See above findings.               PT Education - 09/08/16 1754    Education provided Yes   Education Details PWR training in context of mobilty for PT; POC moving forward   Person(s) Educated Patient   Methods Explanation   Comprehension Verbalized understanding          PT Short Term Goals - 09/08/16 1804      PT SHORT TERM GOAL #1   Title  patient to be able to complete all bed mobility with Mod(I) on familiar and unfamiliar surfaces in order to improve mobility and enhance independence    Time 4   Period Weeks   Status New     PT SHORT TERM GOAL #2   Title Patient to complete TUG test within 13 seconds in order to show improved general mobility and gait efficiency    Time 4   Period Weeks   Status New     PT SHORT TERM GOAL #3   Title Patient to be able to complete 5xSTS in 20 seconds or less without freezing episode in order to show improved mobilty and efficiency of movement    Time 4   Period Weeks   Status New     PT SHORT TERM GOAL #4   Title Patient to be able to verbalize and demonstrate methods to "break through" freezing episodes and improve safety and efficiency of gait/general mobility    Time 4   Period Weeks   Status New     PT SHORT TERM GOAL #5   Title Patient to be compliant with appropriate HEP, to be updated PRN    Time 1   Period Weeks   Status New           PT Long Term Goals - 09/08/16 1806      PT LONG TERM GOAL #1   Title Patient to demonstrate MMT as being 5/5 in all tested groups in order to improve balance and general mobility    Time 8   Period Weeks   Status New     PT LONG TERM GOAL #2   Title Patient to demonstrate at least 30-40% reduction in bradykinesia in order to improve general movement pattern and QOL    Time 8   Period Weeks   Status New     PT LONG TERM GOAL #3   Title Patient to score at least 18 on DGI in order to show improved dynamic balance and reduced fall risk    Time 8   Period Weeks   Status New     PT LONG TERM GOAL #4   Title Patient to be able to ambulate at least 638ft druing 3MWT in order to show improved  mobility and community access    Time 8   Period Weeks   Status New     PT LONG TERM GOAL #5   Title Patient to be able to perform retrogait pattern with equal step lengths and no LOB in order to show improved mobiilty and balance as  well as to assist in improving normal gait pattern    Time 8   Period Weeks   Status New                Plan - 09/08/16 1755    Clinical Impression Statement Patient arrives late today but is very pleasant and willing to participate in skilled PT evaluation this afternoon. He reports that he has been diagnosed with drug-induced Parkinsonism; he is concerned about getting his L UE working better (OT is actively addressing), as well as his ability to get up out of chairs and bed as well as his balance in general. He does express fear of falling when asked to perform 3MWT. Examination reveals postural limitation, general bradykinesia, freezing episodes which required Mod-Max verbal cues to overcome, gait impairment consistent with parkinsonism, general unsteadiness, and general fall risk as a whole. Note that patient was originally being treated for his shoulder at [facility] clinic, before being evaluated at [place] clinic, then transferred here to [facility] clinic for [facility] OT/PT training. As neuro rehab center did not complete formal certification or appropriate goals (unsure why), and this DPT was unable to find MD referral for PT for Parkinsons, a formal Parkinsons evaluation was completed today and we sent an official PWR PT for Parkinsons referral request to MD. Strongly recommend skilled PT services to address functional deficits, reduce fall risk, and optimize overall level of function in face of significant Parkinsonism.    History and Personal Factors relevant to plan of care: drug induced parkinsonism, schizophrenia    Clinical Presentation Evolving   Clinical Presentation due to: progressive neurological disease    Clinical Decision Making Moderate   Rehab Potential Good   Clinical Impairments Affecting Rehab Potential (+) motivated to participate in skilled PT services, disease process very addressable by PT   PT Frequency 2x / week   PT Duration 8 weeks   PT  Treatment/Interventions ADLs/Self Care Home Management;Electrical Stimulation;Moist Heat;Ultrasound;Patient/family education;Therapeutic exercise;Therapeutic activities;Manual techniques;Passive range of motion;Vasopneumatic Device;Dry needling   PT Next Visit Plan review initial eval/goals, assign HEP; strict PWR training!!! F/U on MD order. Discuss tips to "break" freezing episodes.    PT Home Exercise Plan assign 2nd session    Consulted and Agree with Plan of Care Patient      Patient will benefit from skilled therapeutic intervention in order to improve the following deficits and impairments:  Abnormal gait, Improper body mechanics, Decreased coordination, Decreased mobility, Postural dysfunction, Decreased range of motion, Decreased strength, Hypomobility, Decreased balance, Difficulty walking, Decreased safety awareness  Visit Diagnosis: Unsteadiness on feet - Plan: PT plan of care cert/re-cert  Muscle weakness (generalized) - Plan: PT plan of care cert/re-cert  Other symptoms and signs involving the nervous system - Plan: PT plan of care cert/re-cert  Other lack of coordination - Plan: PT plan of care cert/re-cert  Other abnormalities of gait and mobility - Plan: PT plan of care cert/re-cert      G-Codes - 67/89/38 1809    Functional Assessment Tool Used (Outpatient Only) Based on skilled clinical assessment of gait, balance, strength, fall risk, Parkinsonian symptoms    Functional Limitation Mobility: Walking and moving  around   Mobility: Walking and Moving Around Current Status [phone]) At least 60 percent but less than 80 percent impaired, limited or restricted   Mobility: Walking and Moving Around Goal Status [phone]) At least 40 percent but less than 60 percent impaired, limited or restricted       Problem List Patient Active Problem List   Diagnosis Date Noted  . Parkinson disease ([place]) 09/05/2015  . Vitamin D deficiency 09/05/2015  . Metabolic syndrome 10/93/2355  .  Hypertension 04/02/2013  . Hyperlipidemia 04/02/2013  . BPH (benign prostatic hyperplasia) 04/02/2013  . Left arm weakness 11/21/2012  . Parkinsonism ([place]) 11/21/2012  . Neck pain 11/21/2012  . Colon adenomas 06/29/2012  . Rectal mass 06/29/2012  . Schizophrenia ([place]) 06/22/2012  . Dysphagia 06/22/2011  . Eosinophilic esophagitis 73/22/0254    Deniece Ree PT, DPT [phone]  Addendum 09/15/16: corrected typo in duration/frequecy from 2x/week for 2 weeks to 2x/week for 8 weeks. Checked cert and it correctly states 2x/week for 8 weeks. Deniece Ree PT, DPT [phone]   [place] [address] [place], [place], [address] Phone: [phone]   Fax:  [phone]  Name: ROSHAN ROBACK MRN: [id number] Date of Birth: 01/03/1961

## 2016-09-09 ENCOUNTER — Encounter: Payer: PPO | Admitting: Physical Therapy

## 2016-09-10 ENCOUNTER — Encounter (HOSPITAL_COMMUNITY): Payer: Self-pay | Admitting: Occupational Therapy

## 2016-09-10 ENCOUNTER — Ambulatory Visit (HOSPITAL_COMMUNITY): Payer: PPO | Admitting: Physical Therapy

## 2016-09-10 ENCOUNTER — Ambulatory Visit (HOSPITAL_COMMUNITY): Payer: PPO | Admitting: Occupational Therapy

## 2016-09-10 DIAGNOSIS — R278 Other lack of coordination: Secondary | ICD-10-CM

## 2016-09-10 DIAGNOSIS — M6281 Muscle weakness (generalized): Secondary | ICD-10-CM | POA: Diagnosis not present

## 2016-09-10 DIAGNOSIS — R29818 Other symptoms and signs involving the nervous system: Secondary | ICD-10-CM

## 2016-09-10 NOTE — Therapy (Signed)
[place], [place], [address] Phone: [phone]   Fax:  [phone]  Occupational Therapy Treatment  Patient Details  Name: Mark Benjamin MRN: [id number] Date of Birth: Aug 26, 1960 No Data Recorded  Encounter Date: 09/10/2016      OT End of Session - 09/10/16 1556    Visit Number 5   Number of Visits 9   Date for OT Re-Evaluation 09/22/16   Authorization Type [employer]   Authorization Time Period Before 10th visit   Authorization - Visit Number 5   Authorization - Number of Visits 10   OT Start Time 1301   OT Stop Time 1347   OT Time Calculation (min) 46 min   Activity Tolerance Patient tolerated treatment well   Behavior During Therapy [facility] for tasks assessed/performed      Past Medical History:  Diagnosis Date  . Cataract   . Colon polyps   . Dysphagia   . Essential hypertension, benign   . Fatty liver   . Hyperplasia of prostate   . 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 Other and unspecified hyperlipidemia   . Schizophrenia [facility])     Past Surgical History:  Procedure Laterality Date  . COLONOSCOPY  02/13/08  . COLONOSCOPY N/A 07/10/2012   Procedure: COLONOSCOPY;  Surgeon: Rogene Houston, MD;  Location[facility];  Service: Endoscopy;  Laterality: N/A;  225  . UPPER GASTROINTESTINAL ENDOSCOPY  EGD ED   09/09/2010  . URETHRAL DILATION  1965    There were no vitals filed for this visit.      Subjective Assessment - 09/10/16 1307    Subjective  S: I feel stiff in my left wrist.    Currently in Pain? No/denies            [facility] OT Assessment - 09/10/16 1306      Assessment   Diagnosis Weakness; Parkinson's      Precautions   Precautions Fall                  OT Treatments/Exercises (OP) - 09/10/16 1307      Exercises   Exercises Neurological Re-education;Shoulder     Neurological Re-education Exercises   Reciprocal Movements  UBE Level 1 3' reverse-OT assisting with force and amplitude-metronome at 80 bpm also utilized    Other Exercises 1 Scrubbing protocol initiated this session-pt alternating scrubbing in standing with weight on LUE for 1 minute, and carrying 1# weight for 1 minute, 3X   Other Exercises 2 Starfish stretch-full body extension, 3'. LUE with flexor tone throughout, relaxing at end of stretch   Hand Gripper with Large Beads all beads gripper at 25#           P[facility]) - 09/10/16 1554    PWR! exercises Moves in supine   PWR! Up 10 reps cuing for large motion   PWR! Rock 10 reps-cone used as target to facilitate weight shifting and reaching   PWR! Twist 10 reps   PWR! Step 10 reps   Comments metronome used at 60 bpm for amplitude-mod difficulty   PWR! Up 10 reps-flick   PWR! Rock 10 reps-twist   PWR! Twist 10 reps-open hands             OT Education - 09/10/16 1559    Education provided Yes   Education Details scrubbing protocol, starfish stretch   Person(s) Educated Patient   Methods Explanation;Handout;Demonstration  Comprehension Verbalized understanding          OT Short Term Goals - 08/30/16 1216      OT SHORT TERM GOAL #1   Title Pt will benefit from HEP to improve mobility and functioning required for ADL completion.    Time 4   Period Weeks   Status On-going     OT SHORT TERM GOAL #2   Title Pt will improve LUE strength to improve ability to perform overhead reaching using LUE as dominant.    Time 4   Period Weeks   Status On-going     OT SHORT TERM GOAL #3   Title Pt will improve left grip strength by 15# and pinch strength by 3# to improve ability to grasp and hold cups and utensils.   Time 4   Period Weeks   Status On-going     OT SHORT TERM GOAL #4   Title Pt will improve ability to button shirts and tie shoes by completing 9 hole peg test in 1 min or less.    Time 4   Period Weeks   Status On-going     OT SHORT TERM GOAL #5   Title Pt will  complete ADL tasks-grooming, dressing, toilet hygiene-in a timely manner by donning shirts in 10 min or less.    Time 4   Period Weeks   Status On-going     OT SHORT TERM GOAL #6   Title Pt will improve LUE strength to 4/5 to improve ability to perform ADL tasks while using LUE as dominant.    Time 4   Period Weeks   Status On-going     OT SHORT TERM GOAL #7   Title Pt will complete functional reaching tasks with improved mobility taking 2 steps or less towards an object at distance of 3 feet away.    Time 4   Period Weeks   Status On-going                  Plan - 09/10/16 1556    Clinical Impression Statement A: Resumed PWR! moves in supine this session, introduced metronome for rhythm, pt with mod difficulty with maintaining large movements, verbal cuing for beat and full motion. Pt demonstrates improvement with hand gripper activity this session. Also continued with scrubbing protocol-pt noted to freeze when turning from table to move out into hallway, OT instructed pt to count out loud and take a step in a different direction-improvement in resumption of mobility noted.    Plan P: Follow up on scrubbing HEP. Continue with PWR! using metronome, grip strengthening. SLP referral   OT Home Exercise Plan red theraputty for grip and pinch strengthening; 6/15: hand and finger A/ROM; 6/22: scrubbing protocol, starfish stretch   Consulted and Agree with Plan of Care Patient      Patient will benefit from skilled therapeutic intervention in order to improve the following deficits and impairments:  Decreased endurance, Decreased activity tolerance, Decreased knowledge of use of DME, Decreased strength, Impaired flexibility, Decreased balance, Decreased mobility, Pain, Impaired tone, Decreased range of motion, Decreased coordination, Decreased safety awareness, Increased fascial restricitons, Impaired UE functional use  Visit Diagnosis: Other lack of coordination  Other symptoms and  signs involving the nervous system    Problem List Patient Active Problem List   Diagnosis Date Noted  . Parkinson disease ([place]) 09/05/2015  . Vitamin D deficiency 09/05/2015  . Metabolic syndrome 16/12/9602  . Hypertension 04/02/2013  . Hyperlipidemia 04/02/2013  .  BPH (benign prostatic hyperplasia) 04/02/2013  . Left arm weakness 11/21/2012  . Parkinsonism ([place]) 11/21/2012  . Neck pain 11/21/2012  . Colon adenomas 06/29/2012  . Rectal mass 06/29/2012  . Schizophrenia ([place]) 06/22/2012  . Dysphagia 06/22/2011  . Eosinophilic esophagitis 89/16/9450   Guadelupe Sabin, OTR/L  [phone] 09/10/2016, 4:00 PM  [place] [address] [place], [place], [address] Phone: [phone]   Fax:  [phone]  Name: Mark Benjamin MRN: [id number] Date of Birth: 07-25-1960

## 2016-09-15 ENCOUNTER — Ambulatory Visit (HOSPITAL_COMMUNITY): Payer: PPO | Admitting: Physical Therapy

## 2016-09-15 ENCOUNTER — Ambulatory Visit (HOSPITAL_COMMUNITY): Payer: PPO | Admitting: Occupational Therapy

## 2016-09-15 ENCOUNTER — Encounter (HOSPITAL_COMMUNITY): Payer: Self-pay | Admitting: Occupational Therapy

## 2016-09-15 DIAGNOSIS — M6281 Muscle weakness (generalized): Secondary | ICD-10-CM

## 2016-09-15 DIAGNOSIS — R2681 Unsteadiness on feet: Secondary | ICD-10-CM

## 2016-09-15 DIAGNOSIS — R278 Other lack of coordination: Secondary | ICD-10-CM

## 2016-09-15 DIAGNOSIS — R2689 Other abnormalities of gait and mobility: Secondary | ICD-10-CM

## 2016-09-15 DIAGNOSIS — R29818 Other symptoms and signs involving the nervous system: Secondary | ICD-10-CM

## 2016-09-15 NOTE — Therapy (Addendum)
[place], [place], [address] Phone: [phone]   Fax:  [phone]  Occupational Therapy Treatment  Patient Details  Name: ENES ROKOSZ MRN: [id number] Date of Birth: [birth year] No Data Recorded  Encounter Date: 09/15/2016      OT End of Session - 09/15/16 1614    Visit Number 6   Number of Visits 9   Date for OT Re-Evaluation 09/22/16   Authorization Type [employer]   Authorization Time Period Before 10th visit   Authorization - Visit Number 6   Authorization - Number of Visits 10   OT Start Time 1434   OT Stop Time 1519   OT Time Calculation (min) 45 min   Activity Tolerance Patient tolerated treatment well   Behavior During Therapy [facility] for tasks assessed/performed      Past Medical History:  Diagnosis Date  . Cataract   . Colon polyps   . Dysphagia   . Essential hypertension, benign   . Fatty liver   . Hyperplasia of prostate   . 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 Other and unspecified hyperlipidemia   . Schizophrenia [facility])     Past Surgical History:  Procedure Laterality Date  . COLONOSCOPY  02/13/08  . COLONOSCOPY N/A 07/10/2012   Procedure: COLONOSCOPY;  Surgeon: Rogene Houston, MD;  Location[facility];  Service: Endoscopy;  Laterality: N/A;  225  . UPPER GASTROINTESTINAL ENDOSCOPY  EGD ED   09/09/2010  . URETHRAL DILATION  1965    There were no vitals filed for this visit.      Subjective Assessment - 09/15/16 1435    Subjective  S: My hand just isn't working today.    Currently in Pain? No/denies            [facility] OT Assessment - 09/15/16 1435      Assessment   Diagnosis Weakness; Parkinson's      Precautions   Precautions Fall                  OT Treatments/Exercises (OP) - 09/15/16 1607      ADLs   UB Dressing Pt donned button-up shirt this session with min assist from OT due to LUE limitations. Pt practiced  with button hook use this session. Pt was able to successfully button 3 buttons with increased time using button hook. Pt then doffed shirt with mod assist from OT due to bradykinesia and LUE limitations.      Exercises   Exercises Neurological Re-education;Shoulder     Neurological Re-education Exercises   Finger Flexion P/ROM, A/ROM, 5X-cued pt to "flick" fingers with greater amplitude. Max difficulty with amplitude   Finger Extension P/ROM, A/ROM 10X-max difficulty with extension of MCPs.    Other Exercises 1 Pt grasped large pegs both with set-up from OT and independently and placed into pegboard using left hand. Pt required increased time for grasping, placing, and releasing due to coordination and strength deficits, as well as multiple freezing episodes with placement of pegs. Pt cued to count to resolve freezing.    Other Exercises 2 Sustained digit extension-pt placed palm flat on table pushing into table with left hand, used right hand to assist with straightening digits and allowing palm to stay flat on table. Completed 5x, held for 10 seconds   Hand Gripper with Large Beads all beads gripper at 22#   Hand Gripper with Medium Beads unable to complete  this session                OT Short Term Goals - 08/30/16 1216      OT SHORT TERM GOAL #1   Title Pt will benefit from HEP to improve mobility and functioning required for ADL completion.    Time 4   Period Weeks   Status On-going     OT SHORT TERM GOAL #2   Title Pt will improve LUE strength to improve ability to perform overhead reaching using LUE as dominant.    Time 4   Period Weeks   Status On-going     OT SHORT TERM GOAL #3   Title Pt will improve left grip strength by 15# and pinch strength by 3# to improve ability to grasp and hold cups and utensils.   Time 4   Period Weeks   Status On-going     OT SHORT TERM GOAL #4   Title Pt will improve ability to button shirts and tie shoes by completing 9 hole peg test  in 1 min or less.    Time 4   Period Weeks   Status On-going     OT SHORT TERM GOAL #5   Title Pt will complete ADL tasks-grooming, dressing, toilet hygiene-in a timely manner by donning shirts in 10 min or less.    Time 4   Period Weeks   Status On-going     OT SHORT TERM GOAL #6   Title Pt will improve LUE strength to 4/5 to improve ability to perform ADL tasks while using LUE as dominant.    Time 4   Period Weeks   Status On-going     OT SHORT TERM GOAL #7   Title Pt will complete functional reaching tasks with improved mobility taking 2 steps or less towards an object at distance of 3 feet away.    Time 4   Period Weeks   Status On-going                  Plan - 09/15/16 1614    Clinical Impression Statement A: Session with focus on LUE ROM, strength, and fine motor coordiantion. Pt had PT immediately prior to OT session, therefore no PWR! exercises completed today. Pt continues to require increased time due to bradykinesia and freezing episodes. Continues to respond well to counting or speaking to resume function. Also continued with button hook practice-OT notes improvement in technique and independence. Spoke with pt and sister about SLP referral, both are agreeable.    Plan P: Continue with scrubbing protocol-increasing to 2' for scrub and 2' for carry. Focus on IP flexion with grasp and release task; Also continue with MCP sustained extension stretch; attempt grip strengthening with medium beads if pt able to complete   OT Home Exercise Plan red theraputty for grip and pinch strengthening; 6/15: hand and finger A/ROM; 6/22: scrubbing protocol, starfish stretch   Consulted and Agree with Plan of Care Patient      Patient will benefit from skilled therapeutic intervention in order to improve the following deficits and impairments:  Decreased endurance, Decreased activity tolerance, Decreased knowledge of use of DME, Decreased strength, Impaired flexibility, Decreased  balance, Decreased mobility, Pain, Impaired tone, Decreased range of motion, Decreased coordination, Decreased safety awareness, Increased fascial restricitons, Impaired UE functional use  Visit Diagnosis: Other lack of coordination  Other symptoms and signs involving the nervous system    Problem List Patient Active Problem List   Diagnosis  Date Noted  . Parkinson disease ([place]) 09/05/2015  . Vitamin D deficiency 09/05/2015  . Metabolic syndrome 34/74/2595  . Hypertension 04/02/2013  . Hyperlipidemia 04/02/2013  . BPH (benign prostatic hyperplasia) 04/02/2013  . Left arm weakness 11/21/2012  . Parkinsonism ([place]) 11/21/2012  . Neck pain 11/21/2012  . Colon adenomas 06/29/2012  . Rectal mass 06/29/2012  . Schizophrenia ([place]) 06/22/2012  . Dysphagia 06/22/2011  . Eosinophilic esophagitis 63/87/5643   Guadelupe Sabin, OTR/L  [phone] 09/15/2016, 4:19 PM  [place] [address] [place], [place], [address] Phone: [phone]   Fax:  [phone]  Name: SHERLEY LESER MRN: [id number] Date of Birth: 1960-08-14

## 2016-09-15 NOTE — Therapy (Signed)
[place], [place], [address] Phone: [phone]   Fax:  [phone]  Physical Therapy Treatment  Patient Details  Name: Mark Benjamin MRN: [id number] Date of Birth: [birth year] Referring Provider: Redge Gainer   Encounter Date: 09/15/2016      PT End of Session - 09/15/16 1702    Visit Number 20   Number of Visits 35   Date for PT Re-Evaluation 10/06/16   Authorization Type [employer]    Authorization Time Period 09/08/16 to 11/08/16   Authorization - Visit Number 69   Authorization - Number of Visits 29   PT Start Time 0092   PT Stop Time 1428   PT Time Calculation (min) 40 min   Activity Tolerance Patient tolerated treatment well   Behavior During Therapy [facility] for tasks assessed/performed      Past Medical History:  Diagnosis Date  . Cataract   . Colon polyps   . Dysphagia   . Essential hypertension, benign   . Fatty liver   . Hyperplasia of prostate   . 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 Other and unspecified hyperlipidemia   . Schizophrenia [facility])     Past Surgical History:  Procedure Laterality Date  . COLONOSCOPY  02/13/08  . COLONOSCOPY N/A 07/10/2012   Procedure: COLONOSCOPY;  Surgeon: Rogene Houston, MD;  [facility];  Service: Endoscopy;  Laterality: N/A;  225  . UPPER GASTROINTESTINAL ENDOSCOPY  EGD ED   09/09/2010  . URETHRAL DILATION  1965    There were no vitals filed for this visit.      Subjective Assessment - 09/15/16 1351    Subjective Patient arrives stating that he is concerned about his L hand, he reports that he stumbles quite a bit.    Pertinent History Parkinson.  Schizophrenia.   Patient Stated Goals get up from chairs better too, get moving aroudn better    Currently in Pain? No/denies                            PWR [facility]) - 09/15/16 1420    PWR! Up 1x10 cones and ball, mod cues    PWR! [employer], cone targets, mod cues    PWR! Twist 4x6 cones and traditional PWR twist; mod verbal and tactile cues    PWR! Step 2x6 hurdles and cones; mod cues    Basic 4 Flow 2 reps mod cues, mirroring    Comments intermittent counting due to freezing episodes              PT Education - 09/15/16 1702    Education provided Yes   Education Details counting or singing strateigy to reduce severity of freezing episodes    Person(s) Educated Patient   Methods Explanation   Comprehension Verbalized understanding;Returned demonstration;Need further instruction          PT Short Term Goals - 09/08/16 1804      PT SHORT TERM GOAL #1   Title patient to be able to complete all bed mobility with Mod(I) on familiar and unfamiliar surfaces in order to improve mobility and enhance independence    Time 4   Period Weeks   Status New     PT SHORT TERM GOAL #2   Title Patient to complete TUG test within 13 seconds in order to show improved general mobility and gait efficiency  Time 4   Period Weeks   Status New     PT SHORT TERM GOAL #3   Title Patient to be able to complete 5xSTS in 20 seconds or less without freezing episode in order to show improved mobilty and efficiency of movement    Time 4   Period Weeks   Status New     PT SHORT TERM GOAL #4   Title Patient to be able to verbalize and demonstrate methods to "break through" freezing episodes and improve safety and efficiency of gait/general mobility    Time 4   Period Weeks   Status New     PT SHORT TERM GOAL #5   Title Patient to be compliant with appropriate HEP, to be updated PRN    Time 1   Period Weeks   Status New           PT Long Term Goals - 09/08/16 1806      PT LONG TERM GOAL #1   Title Patient to demonstrate MMT as being 5/5 in all tested groups in order to improve balance and general mobility    Time 8   Period Weeks   Status New     PT LONG TERM GOAL #2   Title Patient to demonstrate at least 30-40%  reduction in bradykinesia in order to improve general movement pattern and QOL    Time 8   Period Weeks   Status New     PT LONG TERM GOAL #3   Title Patient to score at least 18 on DGI in order to show improved dynamic balance and reduced fall risk    Time 8   Period Weeks   Status New     PT LONG TERM GOAL #4   Title Patient to be able to ambulate at least 672ft druing 3MWT in order to show improved mobility and community access    Time 8   Period Weeks   Status New     PT LONG TERM GOAL #5   Title Patient to be able to perform retrogait pattern with equal step lengths and no LOB in order to show improved mobiilty and balance as well as to assist in improving normal gait pattern    Time 8   Period Weeks   Status New               Plan - 09/15/16 1703    Clinical Impression Statement Reviewed initial Parkinsons based goals this session and then introduced sitting PWR exercises with mod verbal and visual cues as well as min proprioceptive cues this session. Patient demonstrates general bradykinesia and freezing episodes as well as LOB with gait requiring Min assist from PT to prevent potential fall. Patient continues to voice concern over his L hand as well as difficulty managing saliva. Encouraged counting out loud strategy to assist in breaking freezing episodes with moderate success/min cues today. Recommend trial of walker with laser attached in upcoming sessions.    Rehab Potential Good   Clinical Impairments Affecting Rehab Potential (+) motivated to participate in skilled PT services, disease process very addressable by PT   PT Frequency 2x / week   PT Duration 8 weeks   PT Treatment/Interventions ADLs/Self Care Home Management;Electrical Stimulation;Moist Heat;Ultrasound;Patient/family education;Therapeutic exercise;Therapeutic activities;Manual techniques;Passive range of motion;Vasopneumatic Device;Dry needling   PT Next Visit Plan review initial eval/goals, assign  HEP; strict PWR training!!! Continue working on strategies to counter freezing. Consider walker with laser.    PT Home  Exercise Plan assign 2nd session    Consulted and Agree with Plan of Care Patient      Patient will benefit from skilled therapeutic intervention in order to improve the following deficits and impairments:  Abnormal gait, Improper body mechanics, Decreased coordination, Decreased mobility, Postural dysfunction, Decreased range of motion, Decreased strength, Hypomobility, Decreased balance, Difficulty walking, Decreased safety awareness  Visit Diagnosis: Other lack of coordination  Other symptoms and signs involving the nervous system  Unsteadiness on feet  Muscle weakness (generalized)  Other abnormalities of gait and mobility     Problem List Patient Active Problem List   Diagnosis Date Noted  . Parkinson disease ([place]) 09/05/2015  . Vitamin D deficiency 09/05/2015  . Metabolic syndrome 81/77/1165  . Hypertension 04/02/2013  . Hyperlipidemia 04/02/2013  . BPH (benign prostatic hyperplasia) 04/02/2013  . Left arm weakness 11/21/2012  . Parkinsonism ([place]) 11/21/2012  . Neck pain 11/21/2012  . Colon adenomas 06/29/2012  . Rectal mass 06/29/2012  . Schizophrenia ([place]) 06/22/2012  . Dysphagia 06/22/2011  . Eosinophilic esophagitis 79/05/8331    Deniece Ree PT, DPT [place] [address] [place], [place], [address] Phone: [phone]   Fax:  [phone]  Name: SHANN MERRICK MRN: [id number] Date of Birth: 1960/05/07

## 2016-09-17 ENCOUNTER — Ambulatory Visit (HOSPITAL_COMMUNITY): Payer: PPO

## 2016-09-17 ENCOUNTER — Encounter (HOSPITAL_COMMUNITY): Payer: Self-pay

## 2016-09-17 ENCOUNTER — Ambulatory Visit (HOSPITAL_COMMUNITY): Payer: PPO | Admitting: Physical Therapy

## 2016-09-17 DIAGNOSIS — R29818 Other symptoms and signs involving the nervous system: Secondary | ICD-10-CM

## 2016-09-17 DIAGNOSIS — R278 Other lack of coordination: Secondary | ICD-10-CM

## 2016-09-17 DIAGNOSIS — M6281 Muscle weakness (generalized): Secondary | ICD-10-CM | POA: Diagnosis not present

## 2016-09-17 DIAGNOSIS — R2681 Unsteadiness on feet: Secondary | ICD-10-CM

## 2016-09-17 DIAGNOSIS — R2689 Other abnormalities of gait and mobility: Secondary | ICD-10-CM

## 2016-09-17 NOTE — Therapy (Signed)
[place], [place], [address] Phone: [phone]   Fax:  [phone]  Occupational Therapy Treatment  Patient Details  Name: Mark Mark MRN: [id number] Date of Birth: 1960/09/06 No Data Recorded  Encounter Date: 09/17/2016      OT End of Session - 09/17/16 1442    Visit Number 7   Number of Visits 9   Date for OT Re-Evaluation 09/22/16   Authorization Type [employer]   Authorization Time Period Before 10th visit   Authorization - Visit Number 7   Authorization - Number of Visits 10   OT Start Time 1337   OT Stop Time 1430   OT Time Calculation (min) 53 min   Activity Tolerance Patient tolerated treatment well   Behavior During Therapy [facility] for tasks assessed/performed      Past Medical History:  Diagnosis Date  . Cataract   . Colon polyps   . Dysphagia   . Essential hypertension, benign   . Fatty liver   . Hyperplasia of prostate   . 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 Other and unspecified hyperlipidemia   . Schizophrenia [facility])     Past Surgical History:  Procedure Laterality Date  . COLONOSCOPY  02/13/08  . COLONOSCOPY N/A 07/10/2012   Procedure: COLONOSCOPY;  Surgeon: Rogene Houston, MD;  [facility];  Service: Endoscopy;  Laterality: N/A;  225  . UPPER GASTROINTESTINAL ENDOSCOPY  EGD ED   09/09/2010  . URETHRAL DILATION  1965    There were no vitals filed for this visit.      Subjective Assessment - 09/17/16 1347    Subjective  S: In the car my wrist was bent so it hurt.   Currently in Pain? Yes   Pain Score 4    Pain Location Wrist   Pain Orientation Left   Pain Descriptors / Indicators Sore   Pain Onset More than a month ago   Pain Frequency Intermittent   Multiple Pain Sites No            [facility] OT Assessment - 09/17/16 1350      Assessment   Diagnosis Weakness; Parkinson's      Precautions   Precautions Fall                   OT Treatments/Exercises (OP) - 09/17/16 1350      Exercises   Exercises Neurological Re-education;Shoulder     Neurological Re-education Exercises   Other Exercises 1 Scrubbing protocol initiated this session-pt alternating scrubbing on table top for 3 min and then carrying 3# weight in bag for 3 min, 3X   Other Exercises 2 Pt made fist and released with therapist manual assistance, then was able to do the same thing using his other hand for assistance. Pt opened and closed 10 fists on his own.   Hand Gripper with Large Beads all beads gripper at 15#   Hand Gripper with Medium Beads 7 beads at 15#                OT Education - 09/17/16 1441    Education provided No          OT Short Term Goals - 08/30/16 1216      OT SHORT TERM GOAL #1   Title Pt will benefit from HEP to improve mobility and functioning required for ADL completion.    Time 4   Period  Weeks   Status On-going     OT SHORT TERM GOAL #2   Title Pt will improve LUE strength to improve ability to perform overhead reaching using LUE as dominant.    Time 4   Period Weeks   Status On-going     OT SHORT TERM GOAL #3   Title Pt will improve left grip strength by 15# and pinch strength by 3# to improve ability to grasp and hold cups and utensils.   Time 4   Period Weeks   Status On-going     OT SHORT TERM GOAL #4   Title Pt will improve ability to button shirts and tie shoes by completing 9 hole peg test in 1 min or less.    Time 4   Period Weeks   Status On-going     OT SHORT TERM GOAL #5   Title Pt will complete ADL tasks-grooming, dressing, toilet hygiene-in a timely manner by donning shirts in 10 min or less.    Time 4   Period Weeks   Status On-going     OT SHORT TERM GOAL #6   Title Pt will improve LUE strength to 4/5 to improve ability to perform ADL tasks while using LUE as dominant.    Time 4   Period Weeks   Status On-going     OT SHORT TERM GOAL #7   Title  Pt will complete functional reaching tasks with improved mobility taking 2 steps or less towards an object at distance of 3 feet away.    Time 4   Period Weeks   Status On-going                  Plan - 09/17/16 1442    Clinical Impression Statement A: Continued with scrubbing program, increasing time to 3 minutes for each activity. Pt required cueing for speed during scrubbing, moments of freezing during scrubbing although was quickly redirected. Increased speed in gait during last trial of walking/carrying, cues to walk slow with bigger steps to increase saftey. Pt did well with carryover of education. Pt was able to use gripper with large beads and progress to medium beads today.   Plan P: Continue with scrubbing protocol- 3' for scrub, 3' for carry. Focus on IP flexion with grasp and release task.      Patient will benefit from skilled therapeutic intervention in order to improve the following deficits and impairments:  Decreased endurance, Decreased activity tolerance, Decreased knowledge of use of DME, Decreased strength, Impaired flexibility, Decreased balance, Decreased mobility, Pain, Impaired tone, Decreased range of motion, Decreased coordination, Decreased safety awareness, Increased fascial restricitons, Impaired UE functional use  Visit Diagnosis: Other lack of coordination  Other symptoms and signs involving the nervous system    Problem List Patient Active Problem List   Diagnosis Date Noted  . Parkinson disease ([place]) 09/05/2015  . Vitamin D deficiency 09/05/2015  . Metabolic syndrome 75/17/0017  . Hypertension 04/02/2013  . Hyperlipidemia 04/02/2013  . BPH (benign prostatic hyperplasia) 04/02/2013  . Left arm weakness 11/21/2012  . Parkinsonism ([place]) 11/21/2012  . Neck pain 11/21/2012  . Colon adenomas 06/29/2012  . Rectal mass 06/29/2012  . Schizophrenia ([place]) 06/22/2012  . Dysphagia 06/22/2011  . Eosinophilic esophagitis 49/44/9675    Luther Hearing, OT Student [phone] 09/17/2016, 2:46 PM  [place] [address] [place], [place], [address] Phone: [phone]   Fax:  [phone]  Name: Mark Mark MRN: [id number] Date  of Birth: February 21, 1961     This qualified practitioner was present in the room guiding the student in service delivery. Therapy student was participating in the provision of services, and the practitioner was not engaged in treating another patient or doing other tasks at the same time.  Ailene Ravel, OTR/L,CBIS  [phone]

## 2016-09-17 NOTE — Therapy (Signed)
[place], [place], [address] Phone: [phone]   Fax:  [phone]  Physical Therapy Treatment  Patient Details  Name: Mark Benjamin MRN: [id number] Date of Birth: [birth year] Referring Provider: Redge Gainer   Encounter Date: 09/17/2016      PT End of Session - 09/17/16 1611    Visit Number 21   Number of Visits 35   Date for PT Re-Evaluation 10/06/16   Authorization Type [employer]    Authorization Time Period 09/08/16 to 11/08/16   Authorization - Visit Number 21   Authorization - Number of Visits 29   PT Start Time 2229   PT Stop Time 1513   PT Time Calculation (min) 42 min   Activity Tolerance Patient tolerated treatment well   Behavior During Therapy [facility] for tasks assessed/performed      Past Medical History:  Diagnosis Date  . Cataract   . Colon polyps   . Dysphagia   . Essential hypertension, benign   . Fatty liver   . Hyperplasia of prostate   . 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 Other and unspecified hyperlipidemia   . Schizophrenia [facility])     Past Surgical History:  Procedure Laterality Date  . COLONOSCOPY  02/13/08  . COLONOSCOPY N/A 07/10/2012   Procedure: COLONOSCOPY;  Surgeon: Rogene Houston, MD;  [facility];  Service: Endoscopy;  Laterality: N/A;  225  . UPPER GASTROINTESTINAL ENDOSCOPY  EGD ED   09/09/2010  . URETHRAL DILATION  1965    There were no vitals filed for this visit.      Subjective Assessment - 09/17/16 1432    Subjective Patient arrives stating that he had an accident in the bathroom last night, there someone there to help him get up. He did not hit his head but he did land flat on his back.    Pertinent History Parkinson.  Schizophrenia.   Patient Stated Goals get up from chairs better too, get moving aroudn better    Currently in Pain? Yes   Pain Score 4    Pain Location Wrist   Pain Orientation Left   Pain  Descriptors / Indicators Sore   Pain Onset More than a month ago                         [facility] Adult PT Treatment/Exercise - 09/17/16 1610      Knee/Hip Exercises: Standing   Gait Training cues for longer step lengths/slower speed during gait throughout session    Other Standing Knee Exercises stepping to target: forward and lateral in bars      Starfish stertch supine x4 minutes       PWR [facility]) - 09/17/16 1444    PWR Step 2x3 standing in bars modified min guard mod cues    PWR! Up 1x5 ball and cones, seated    PWR! Rock 2x5 cone targets, seated    PWR! Twist 2x5 each, seated, mod facilitation    PWR! Step 2x5 each side, hurdles and cones seated              PT Education - 09/17/16 1611    Education provided Yes   Education Details starfish stretch at home, continue to encourage counting strategy to reduce freezing episodes    Person(s) Educated Patient   Methods Explanation   Comprehension Verbalized understanding  PT Short Term Goals - 09/08/16 1804      PT SHORT TERM GOAL #1   Title patient to be able to complete all bed mobility with Mod(I) on familiar and unfamiliar surfaces in order to improve mobility and enhance independence    Time 4   Period Weeks   Status New     PT SHORT TERM GOAL #2   Title Patient to complete TUG test within 13 seconds in order to show improved general mobility and gait efficiency    Time 4   Period Weeks   Status New     PT SHORT TERM GOAL #3   Title Patient to be able to complete 5xSTS in 20 seconds or less without freezing episode in order to show improved mobilty and efficiency of movement    Time 4   Period Weeks   Status New     PT SHORT TERM GOAL #4   Title Patient to be able to verbalize and demonstrate methods to "break through" freezing episodes and improve safety and efficiency of gait/general mobility    Time 4   Period Weeks   Status New     PT SHORT TERM GOAL #5   Title Patient to  be compliant with appropriate HEP, to be updated PRN    Time 1   Period Weeks   Status New           PT Long Term Goals - 09/08/16 1806      PT LONG TERM GOAL #1   Title Patient to demonstrate MMT as being 5/5 in all tested groups in order to improve balance and general mobility    Time 8   Period Weeks   Status New     PT LONG TERM GOAL #2   Title Patient to demonstrate at least 30-40% reduction in bradykinesia in order to improve general movement pattern and QOL    Time 8   Period Weeks   Status New     PT LONG TERM GOAL #3   Title Patient to score at least 18 on DGI in order to show improved dynamic balance and reduced fall risk    Time 8   Period Weeks   Status New     PT LONG TERM GOAL #4   Title Patient to be able to ambulate at least 662ft druing 3MWT in order to show improved mobility and community access    Time 8   Period Weeks   Status New     PT LONG TERM GOAL #5   Title Patient to be able to perform retrogait pattern with equal step lengths and no LOB in order to show improved mobiilty and balance as well as to assist in improving normal gait pattern    Time 8   Period Weeks   Status New               Plan - 09/17/16 1612    Clinical Impression Statement Introduced supine starfish stretch, continued with seated PWR exercises noting improved performance but still ongoing moderate difficulty with full range and amplitude movements. Also worked on forward and lateral step lengths with cone targets in parallel bars as well as introduction of standing PWR step also in parallel bars today. Continue to plan to use laser for gait training/reducing freezing episodes once clinic obtains correct equipment.    Rehab Potential Good   Clinical Impairments Affecting Rehab Potential (+) motivated to participate in skilled PT services, disease process very  addressable by PT   PT Frequency 2x / week   PT Duration 8 weeks   PT Treatment/Interventions ADLs/Self Care  Home Management;Electrical Stimulation;Moist Heat;Ultrasound;Patient/family education;Therapeutic exercise;Therapeutic activities;Manual techniques;Passive range of motion;Vasopneumatic Device;Dry needling   PT Next Visit Plan continue working on seated PWR and starfish stretch; expand standing PWR. Trial walker with laster. Continue strategies to manage freezing.    PT Home Exercise Plan starfish stretch    Consulted and Agree with Plan of Care Patient      Patient will benefit from skilled therapeutic intervention in order to improve the following deficits and impairments:  Abnormal gait, Improper body mechanics, Decreased coordination, Decreased mobility, Postural dysfunction, Decreased range of motion, Decreased strength, Hypomobility, Decreased balance, Difficulty walking, Decreased safety awareness  Visit Diagnosis: Unsteadiness on feet  Muscle weakness (generalized)  Other abnormalities of gait and mobility  Other symptoms and signs involving the nervous system  Other lack of coordination     Problem List Patient Active Problem List   Diagnosis Date Noted  . Parkinson disease ([place]) 09/05/2015  . Vitamin D deficiency 09/05/2015  . Metabolic syndrome 37/85/8850  . Hypertension 04/02/2013  . Hyperlipidemia 04/02/2013  . BPH (benign prostatic hyperplasia) 04/02/2013  . Left arm weakness 11/21/2012  . Parkinsonism ([place]) 11/21/2012  . Neck pain 11/21/2012  . Colon adenomas 06/29/2012  . Rectal mass 06/29/2012  . Schizophrenia ([place]) 06/22/2012  . Dysphagia 06/22/2011  . Eosinophilic esophagitis 27/74/1287    Deniece Ree PT, DPT [place] [address] [place], [place], [address] Phone: [phone]   Fax:  [phone]  Name: KENDRIK MCSHAN MRN: [id number] Date of Birth: 06-Jun-1960

## 2016-09-21 ENCOUNTER — Ambulatory Visit (HOSPITAL_COMMUNITY): Payer: PPO | Admitting: Physical Therapy

## 2016-09-21 ENCOUNTER — Ambulatory Visit (HOSPITAL_COMMUNITY): Payer: PPO | Attending: Family Medicine | Admitting: Occupational Therapy

## 2016-09-21 ENCOUNTER — Encounter (HOSPITAL_COMMUNITY): Payer: Self-pay | Admitting: Occupational Therapy

## 2016-09-21 DIAGNOSIS — R2689 Other abnormalities of gait and mobility: Secondary | ICD-10-CM | POA: Insufficient documentation

## 2016-09-21 DIAGNOSIS — M6281 Muscle weakness (generalized): Secondary | ICD-10-CM | POA: Insufficient documentation

## 2016-09-21 DIAGNOSIS — R2681 Unsteadiness on feet: Secondary | ICD-10-CM | POA: Insufficient documentation

## 2016-09-21 DIAGNOSIS — R29818 Other symptoms and signs involving the nervous system: Secondary | ICD-10-CM | POA: Diagnosis not present

## 2016-09-21 DIAGNOSIS — R278 Other lack of coordination: Secondary | ICD-10-CM | POA: Insufficient documentation

## 2016-09-21 NOTE — Therapy (Signed)
[facility] [address] [place], [place], [address] Phone: [phone]   Fax:  [phone]  Physical Therapy Treatment  Patient Details  Name: Mark Benjamin MRN: [id number] Date of Birth: [birth year] Referring Provider: Redge Gainer   Encounter Date: 09/21/2016      PT End of Session - 09/21/16 1821    Visit Number 22   Number of Visits 35   Date for PT Re-Evaluation 10/06/16   Authorization Type [employer]    Authorization Time Period 09/08/16 to 11/08/16   Authorization - Visit Number 68   Authorization - Number of Visits 29   PT Start Time 2683   PT Stop Time 1430   PT Time Calculation (min) 43 min   Equipment Utilized During Treatment Gait belt   Activity Tolerance Patient tolerated treatment well   Behavior During Therapy [facility] for tasks assessed/performed      Past Medical History:  Diagnosis Date  . Cataract   . Colon polyps   . Dysphagia   . Essential hypertension, benign   . Fatty liver   . Hyperplasia of prostate   . 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 Other and unspecified hyperlipidemia   . Schizophrenia [facility])     Past Surgical History:  Procedure Laterality Date  . COLONOSCOPY  02/13/08  . COLONOSCOPY N/A 07/10/2012   Procedure: COLONOSCOPY;  Surgeon: Rogene Houston, MD;  Location: [facility];  Service: Endoscopy;  Laterality: N/A;  225  . UPPER GASTROINTESTINAL ENDOSCOPY  EGD ED   09/09/2010  . URETHRAL DILATION  1965    There were no vitals filed for this visit.      Subjective Assessment - 09/21/16 1351    Subjective (P)  Ptient states he had a good weekend but mostly stayed out of the heat, no falls since last session    Patient Stated Goals (P)  get up from chairs better too, get moving aroudn better    Currently in Pain? (P)  No/denies                         [facility] Adult PT Treatment/Exercise - 09/21/16 1821      Knee/Hip Exercises:  Standing   Gait Training technique with SPC, min guard, cues for step length and pacing            PWR [facility]) - 09/21/16 1820    PWR! Up 1x10 ball and cone, standing    PWR! [employer], cone targets, standnig    PWR! Twist 2x8, cone targets, standing    PWR Step 2x6 hurdles and cones standing    Comments mod cues, min guard              PT Education - 09/21/16 1821    Education provided Yes   Education Details SPC techinque    Person(s) Educated Patient   Methods Explanation   Comprehension Verbalized understanding;Returned demonstration;Need further instruction          PT Short Term Goals - 09/08/16 1804      PT SHORT TERM GOAL #1   Title patient to be able to complete all bed mobility with Mod(I) on familiar and unfamiliar surfaces in order to improve mobility and enhance independence    Time 4   Period Weeks   Status New     PT SHORT TERM GOAL #2   Title Patient to complete TUG test  within 13 seconds in order to show improved general mobility and gait efficiency    Time 4   Period Weeks   Status New     PT SHORT TERM GOAL #3   Title Patient to be able to complete 5xSTS in 20 seconds or less without freezing episode in order to show improved mobilty and efficiency of movement    Time 4   Period Weeks   Status New     PT SHORT TERM GOAL #4   Title Patient to be able to verbalize and demonstrate methods to "break through" freezing episodes and improve safety and efficiency of gait/general mobility    Time 4   Period Weeks   Status New     PT SHORT TERM GOAL #5   Title Patient to be compliant with appropriate HEP, to be updated PRN    Time 1   Period Weeks   Status New           PT Long Term Goals - 09/08/16 1806      PT LONG TERM GOAL #1   Title Patient to demonstrate MMT as being 5/5 in all tested groups in order to improve balance and general mobility    Time 8   Period Weeks   Status New     PT LONG TERM GOAL #2   Title Patient to  demonstrate at least 30-40% reduction in bradykinesia in order to improve general movement pattern and QOL    Time 8   Period Weeks   Status New     PT LONG TERM GOAL #3   Title Patient to score at least 18 on DGI in order to show improved dynamic balance and reduced fall risk    Time 8   Period Weeks   Status New     PT LONG TERM GOAL #4   Title Patient to be able to ambulate at least 672ft druing 3MWT in order to show improved mobility and community access    Time 8   Period Weeks   Status New     PT LONG TERM GOAL #5   Title Patient to be able to perform retrogait pattern with equal step lengths and no LOB in order to show improved mobiilty and balance as well as to assist in improving normal gait pattern    Time 8   Period Weeks   Status New               Plan - 09/21/16 1822    Clinical Impression Statement Patient arrives today with cane, reporting ongoing issues of managing saliva (speech therapy order has been placed by OT already). Introduced standing PWR exercises today in parallel bars, noting increased bradykinesia and freezing, cues provided to assist in managing freezing episodes (counting strategy) and min guard/mod cues provided. Also worked on gait with SPC and recommended that patient continue to use this device, noting improved gait pacing and step length/steadiness with it this session.    Rehab Potential Good   Clinical Impairments Affecting Rehab Potential (+) motivated to participate in skilled PT services, disease process very addressable by PT   PT Frequency 2x / week   PT Duration 8 weeks   PT Treatment/Interventions ADLs/Self Care Home Management;Electrical Stimulation;Moist Heat;Ultrasound;Patient/family education;Therapeutic exercise;Therapeutic activities;Manual techniques;Passive range of motion;Vasopneumatic Device;Dry needling   PT Next Visit Plan continue starfish stretch, standing PWR and seated PWR. Gait training with SPC. Trial gait with  laser. Freezing strategies    PT Home Exercise  Plan starfish stretch    Consulted and Agree with Plan of Care Patient      Patient will benefit from skilled therapeutic intervention in order to improve the following deficits and impairments:  Abnormal gait, Improper body mechanics, Decreased coordination, Decreased mobility, Postural dysfunction, Decreased range of motion, Decreased strength, Hypomobility, Decreased balance, Difficulty walking, Decreased safety awareness  Visit Diagnosis: Unsteadiness on feet  Muscle weakness (generalized)  Other abnormalities of gait and mobility     Problem List Patient Active Problem List   Diagnosis Date Noted  . Parkinson disease ([place]) 09/05/2015  . Vitamin D deficiency 09/05/2015  . Metabolic syndrome 08/20/5613  . Hypertension 04/02/2013  . Hyperlipidemia 04/02/2013  . BPH (benign prostatic hyperplasia) 04/02/2013  . Left arm weakness 11/21/2012  . Parkinsonism ([place]) 11/21/2012  . Neck pain 11/21/2012  . Colon adenomas 06/29/2012  . Rectal mass 06/29/2012  . Schizophrenia ([place]) 06/22/2012  . Dysphagia 06/22/2011  . Eosinophilic esophagitis 37/94/3276    Deniece Ree PT, DPT [facility] [address] [place], [place], [address] Phone: [phone]   Fax:  [phone]  Name: Mark Benjamin MRN: [id number] Date of Birth: February 11, 1961

## 2016-09-21 NOTE — Therapy (Signed)
[facility] [address] [place], [place], [address] Phone: [phone]   Fax:  [phone]  Occupational Therapy Reassessment, Treatment (recertification)  Patient Details  Name: Mark Benjamin MRN: [id number] Date of Birth: Sep 30, 1960 No Data Recorded  Encounter Date: 09/21/2016      OT End of Session - 09/21/16 1536    Visit Number 8   Number of Visits 16   Date for OT Re-Evaluation 10/22/16   Authorization Type [employer]   Authorization Time Period Before 18th visit   Authorization - Visit Number 8   Authorization - Number of Visits 18   OT Start Time 1430   OT Stop Time 1517   OT Time Calculation (min) 47 min   Activity Tolerance Patient tolerated treatment well   Behavior During Therapy [facility] for tasks assessed/performed      Past Medical History:  Diagnosis Date  . Cataract   . Colon polyps   . Dysphagia   . Essential hypertension, benign   . Fatty liver   . Hyperplasia of prostate   . 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 Other and unspecified hyperlipidemia   . Schizophrenia [facility])     Past Surgical History:  Procedure Laterality Date  . COLONOSCOPY  02/13/08  . COLONOSCOPY N/A 07/10/2012   Procedure: COLONOSCOPY;  Surgeon: Rogene Houston, MD;  [facility];  Service: Endoscopy;  Laterality: N/A;  225  . UPPER GASTROINTESTINAL ENDOSCOPY  EGD ED   09/09/2010  . URETHRAL DILATION  1965    There were no vitals filed for this visit.      Subjective Assessment - 09/21/16 1533    Subjective  S: I'm doing the scrubbing at home.    Currently in Pain? No/denies            [facility] OT Assessment - 09/21/16 1528      Assessment   Diagnosis Weakness; Parkinson's      Precautions   Precautions Fall     Observation/Other Assessments   Observations Pt continues to have hyperextended 1st, 2nd, and 3rd digits of left hand; stiffness/rigidity of wrist, and shiny skin  discoloration     Coordination   Left 9 Hole Peg Test 1'04" with set-up  1'21" without set-up previous   Tremors No tremors noted     Tone   Assessment Location Left Upper Extremity     AROM   Overall AROM Comments Assessed in standing, RUE is [facility]   AROM Assessment Site Wrist   Left Shoulder Flexion 85 Degrees  95 previous   Left Shoulder ABduction 60 Degrees  same as previous   Left Shoulder Internal Rotation 90 Degrees  same as previous   Left Shoulder External Rotation 38 Degrees  same as previous   Right/Left Wrist Left   Left Wrist Extension 0 Degrees  not previously assessed   Left Wrist Flexion 45 Degrees  not previously assessed     PROM   Overall PROM Comments P/ROM is [facility]     Strength   Overall Strength Comments Assessed in sitting, er/IR adducted   Left Shoulder Flexion 3-/5  same as previous   Left Shoulder ABduction 3-/5  same as previous   Left Shoulder Internal Rotation 3/5  same as previous   Left Shoulder External Rotation 3-/5  same as previous   Left Hand Grip (lbs) 30  24 previous   Left Hand Lateral Pinch 15 lbs  same  as previous   Left Hand 3 Point Pinch 7 lbs  6 previous     LUE Tone   LUE Tone Mild                  OT Treatments/Exercises (OP) - 09/21/16 1534      Exercises   Exercises Neurological Re-education;Shoulder     Neurological Re-education Exercises   Wrist Flexion PROM;5 reps   Wrist Extension PROM;5 reps  sustained stretch   Finger Flexion P/ROM, A/ROM 5X   Finger Extension P/ROM, A/ROM 5X   Other Exercises 1 Scrubbing protocol-pt alternating scrubbing on table top for 3 min and then carrying 3# weight in bag for 3 min, 3X. During scrubbing pt placing open palm on towel and weightbearing through elbow and wrist. Mod difficulty with wrist extension due to stiffness, no pain    Other Exercises 2 Prayer stretch for wrist extension, 3X, 10 second hold                  OT Short Term Goals - 08/30/16  1216      OT SHORT TERM GOAL #1   Title Pt will benefit from HEP to improve mobility and functioning required for ADL completion.    Time 4   Period Weeks   Status On-going     OT SHORT TERM GOAL #2   Title Pt will improve LUE strength to improve ability to perform overhead reaching using LUE as dominant.    Time 4   Period Weeks   Status On-going     OT SHORT TERM GOAL #3   Title Pt will improve left grip strength by 15# and pinch strength by 3# to improve ability to grasp and hold cups and utensils.   Time 4   Period Weeks   Status On-going     OT SHORT TERM GOAL #4   Title Pt will improve ability to button shirts and tie shoes by completing 9 hole peg test in 1 min or less.    Time 4   Period Weeks   Status On-going     OT SHORT TERM GOAL #5   Title Pt will complete ADL tasks-grooming, dressing, toilet hygiene-in a timely manner by donning shirts in 10 min or less.    Time 4   Period Weeks   Status On-going     OT SHORT TERM GOAL #6   Title Pt will improve LUE strength to 4/5 to improve ability to perform ADL tasks while using LUE as dominant.    Time 4   Period Weeks   Status On-going     OT SHORT TERM GOAL #7   Title Pt will complete functional reaching tasks with improved mobility taking 2 steps or less towards an object at distance of 3 feet away.    Time 4   Period Weeks   Status On-going                  Plan - 09/21/16 1537    Clinical Impression Statement A: Reassessment completed this session, pt is making progress towards grip and pinch strength goals, as well as coordination tasks. Pt with difficulty completing 9 hole peg test this session due to inability to achieve IP flexion and grasp pegs, therefore set-up provided. Pt continues to present with Parkinson's-like symptoms, as well as CRPS-like symptoms in the left wrist and hand. Pt requires increased time during session for all tasks due to freezing episodes, counting or speaking aloud  does  assist with resumption of movement. Pt's sister is concerned of pt decline over weekend, OT does not note obvious decline during session. Recommend continuing OT 2x/week for 4 additional weeks working on PWR!, LUE strengthening and mobility, grip and pinch strengthening, as well as improving functional use of left hand during ADL tasks.    Rehab Potential Good   OT Frequency 4x / week   OT Duration 4 weeks   OT Treatment/Interventions Self-care/ADL training;Therapeutic exercise;Patient/family education;Manual Therapy;Neuromuscular education;DME and/or AE instruction;Therapeutic activities;Electrical Stimulation;Passive range of motion;Moist Heat   Plan P: Follow up on MD appt. Saebo ball exercise working on IP flexion and LUE reaching with grasp and release task   OT Home Exercise Plan red theraputty for grip and pinch strengthening; 6/15: hand and finger A/ROM; 6/22: scrubbing protocol, starfish stretch   Consulted and Agree with Plan of Care Patient   Family Member Consulted sister-[name]      Patient will benefit from skilled therapeutic intervention in order to improve the following deficits and impairments:  Decreased endurance, Decreased activity tolerance, Decreased knowledge of use of DME, Decreased strength, Impaired flexibility, Decreased balance, Decreased mobility, Pain, Impaired tone, Decreased range of motion, Decreased coordination, Decreased safety awareness, Increased fascial restricitons, Impaired UE functional use  Visit Diagnosis: Other symptoms and signs involving the nervous system - Plan: Ot plan of care cert/re-cert  Other lack of coordination - Plan: Ot plan of care cert/re-cert      G-Codes - 21/19/41 1544    Functional Assessment Tool Used (Outpatient only) Clinical jugement   Functional Limitation Self care   Self Care Current Status (D4081) At least 60 percent but less than 80 percent impaired, limited or restricted   Self Care Goal Status (K4818) At least 40  percent but less than 60 percent impaired, limited or restricted      Problem List Patient Active Problem List   Diagnosis Date Noted  . Parkinson disease ([place]) 09/05/2015  . Vitamin D deficiency 09/05/2015  . Metabolic syndrome 56/31/4970  . Hypertension 04/02/2013  . Hyperlipidemia 04/02/2013  . BPH (benign prostatic hyperplasia) 04/02/2013  . Left arm weakness 11/21/2012  . Parkinsonism ([place]) 11/21/2012  . Neck pain 11/21/2012  . Colon adenomas 06/29/2012  . Rectal mass 06/29/2012  . Schizophrenia ([place]) 06/22/2012  . Dysphagia 06/22/2011  . Eosinophilic esophagitis 26/37/8588   Guadelupe Sabin, OTR/L  [phone] 09/21/2016, 3:44 PM  [facility] [address] [place], [place], [address] Phone: [phone]   Fax:  [phone]  Name: Mark Benjamin MRN: [id number] Date of Birth: 11-21-1960

## 2016-09-23 ENCOUNTER — Telehealth (HOSPITAL_COMMUNITY): Payer: Self-pay | Admitting: Physical Therapy

## 2016-09-23 NOTE — Telephone Encounter (Signed)
Pt's sister called and cx this appment and requsted they be scheduled on the end of this appointment dates.

## 2016-09-24 ENCOUNTER — Encounter (HOSPITAL_COMMUNITY): Payer: Self-pay | Admitting: Occupational Therapy

## 2016-09-24 ENCOUNTER — Ambulatory Visit (HOSPITAL_COMMUNITY): Payer: PPO | Admitting: Occupational Therapy

## 2016-09-24 ENCOUNTER — Ambulatory Visit (HOSPITAL_COMMUNITY): Payer: PPO | Admitting: Physical Therapy

## 2016-09-24 DIAGNOSIS — R278 Other lack of coordination: Secondary | ICD-10-CM

## 2016-09-24 DIAGNOSIS — R29818 Other symptoms and signs involving the nervous system: Secondary | ICD-10-CM | POA: Diagnosis not present

## 2016-09-24 DIAGNOSIS — R2681 Unsteadiness on feet: Secondary | ICD-10-CM

## 2016-09-24 DIAGNOSIS — R2689 Other abnormalities of gait and mobility: Secondary | ICD-10-CM

## 2016-09-24 DIAGNOSIS — M6281 Muscle weakness (generalized): Secondary | ICD-10-CM

## 2016-09-24 NOTE — Therapy (Signed)
[facility] [place], [place], [address] Phone: [phone]   Fax:  [phone]  Occupational Therapy Treatment  Patient Details  Name: Mark Benjamin MRN: [id number] Date of Birth: 07-12-1960 No Data Recorded  Encounter Date: 09/24/2016      OT End of Session - 09/24/16 1606    Visit Number 9   Number of Visits 16   Date for OT Re-Evaluation 10/22/16   Authorization Type [employer]   Authorization Time Period Before 18th visit   Authorization - Visit Number 9   Authorization - Number of Visits 18   OT Start Time 1512   OT Stop Time 1600   OT Time Calculation (min) 48 min   Activity Tolerance Patient tolerated treatment well   Behavior During Therapy [facility] for tasks assessed/performed      Past Medical History:  Diagnosis Date  . Cataract   . Colon polyps   . Dysphagia   . Essential hypertension, benign   . Fatty liver   . Hyperplasia of prostate   . 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 Other and unspecified hyperlipidemia   . Schizophrenia [facility])     Past Surgical History:  Procedure Laterality Date  . COLONOSCOPY  02/13/08  . COLONOSCOPY N/A 07/10/2012   Procedure: COLONOSCOPY;  Surgeon: Rogene Houston, MD;  [facility];  Service: Endoscopy;  Laterality: N/A;  225  . UPPER GASTROINTESTINAL ENDOSCOPY  EGD ED   09/09/2010  . URETHRAL DILATION  1965    There were no vitals filed for this visit.      Subjective Assessment - 09/24/16 1513    Subjective  S: I haven't done my exercises since last time.    Currently in Pain? No/denies            [facility] OT Assessment - 09/24/16 1511      Assessment   Diagnosis Weakness; Parkinson's      Precautions   Precautions Fall                  OT Treatments/Exercises (OP) - 09/24/16 1513      Exercises   Exercises Neurological Re-education;Shoulder     Neurological Re-education Exercises   Wrist  Flexion PROM;5 reps;AROM;10 reps   Wrist Extension PROM;5 reps;AROM;10 reps  sustained stretch   Other Exercises 1 Saebo ball task-pt grasped balls on green pole and placed on blue pole using left hand, working on spherical grasp and shoulder protraction and flexion. Pt with max difficulty achieving spherical grasp due to stiffness.    Other Exercises 2 Prayer stretch for wrist extension, 3X, 10 second hold. Table stretch for wrist flexion, 3X, 10 second hold   Hand Gripper with Large Beads all beads gripper at 20#   Hand Gripper with Medium Beads all beads gripper at 20#           PWR [facility]) - 09/24/16 1604    PWR! exercises Moves in sitting   PWR! Up 5 reps-flick   PWR! Twist 5 reps-open hands   PWR! Up 5 reps   PWR! Rock 5 reps-cone targets   PWR! Twist 5 reps, visual cuing   Comments Verbal cuing for amplitude               OT Short Term Goals - 08/30/16 1216      OT SHORT TERM GOAL #1   Title Pt will benefit from HEP to improve  mobility and functioning required for ADL completion.    Time 4   Period Weeks   Status On-going     OT SHORT TERM GOAL #2   Title Pt will improve LUE strength to improve ability to perform overhead reaching using LUE as dominant.    Time 4   Period Weeks   Status On-going     OT SHORT TERM GOAL #3   Title Pt will improve left grip strength by 15# and pinch strength by 3# to improve ability to grasp and hold cups and utensils.   Time 4   Period Weeks   Status On-going     OT SHORT TERM GOAL #4   Title Pt will improve ability to button shirts and tie shoes by completing 9 hole peg test in 1 min or less.    Time 4   Period Weeks   Status On-going     OT SHORT TERM GOAL #5   Title Pt will complete ADL tasks-grooming, dressing, toilet hygiene-in a timely manner by donning shirts in 10 min or less.    Time 4   Period Weeks   Status On-going     OT SHORT TERM GOAL #6   Title Pt will improve LUE strength to 4/5 to improve ability  to perform ADL tasks while using LUE as dominant.    Time 4   Period Weeks   Status On-going     OT SHORT TERM GOAL #7   Title Pt will complete functional reaching tasks with improved mobility taking 2 steps or less towards an object at distance of 3 feet away.    Time 4   Period Weeks   Status On-going                  Plan - 09/24/16 1606    Clinical Impression Statement A: Saebo ball task added this session working on LUE functional reaching and left hand grasp. Pt with max difficulty achieving spherical grasp-OT providing mod facilitation for grasping saebo balls. Pt able to grasp all beads during grip strengthening with gripper at 20#, increased time due to freezing. Pt with improved wrist A/ROM after sustained stretching.    Plan P: Follow up on MD appt, continue working on left hand grasp and release achieving IP flexion and MP extension along with wrist extension   OT Home Exercise Plan red theraputty for grip and pinch strengthening; 6/15: hand and finger A/ROM; 6/22: scrubbing protocol, starfish stretch   Consulted and Agree with Plan of Care Patient      Patient will benefit from skilled therapeutic intervention in order to improve the following deficits and impairments:  Decreased endurance, Decreased activity tolerance, Decreased knowledge of use of DME, Decreased strength, Impaired flexibility, Decreased balance, Decreased mobility, Pain, Impaired tone, Decreased range of motion, Decreased coordination, Decreased safety awareness, Increased fascial restricitons, Impaired UE functional use  Visit Diagnosis: Other lack of coordination  Other symptoms and signs involving the nervous system    Problem List Patient Active Problem List   Diagnosis Date Noted  . Parkinson disease ([place]) 09/05/2015  . Vitamin D deficiency 09/05/2015  . Metabolic syndrome 34/19/6222  . Hypertension 04/02/2013  . Hyperlipidemia 04/02/2013  . BPH (benign prostatic hyperplasia)  04/02/2013  . Left arm weakness 11/21/2012  . Parkinsonism ([place]) 11/21/2012  . Neck pain 11/21/2012  . Colon adenomas 06/29/2012  . Rectal mass 06/29/2012  . Schizophrenia ([place]) 06/22/2012  . Dysphagia 06/22/2011  . Eosinophilic esophagitis 97/98/9211  Guadelupe Sabin, OTR/L  [phone] 09/24/2016, 4:09 PM  [facility] [address] [place], [place], [address] Phone: [phone]   Fax:  [phone]  Name: Mark Benjamin MRN: [id number] Date of Birth: 02-06-1961

## 2016-09-24 NOTE — Therapy (Addendum)
[place], [place], [address] Phone: [phone]   Fax:  [phone]  Physical Therapy Treatment  Patient Details  Name: Mark Benjamin MRN: [id number] Date of Birth: October 03, 1960 Referring Provider: Redge Gainer   Encounter Date: 09/24/2016      PT End of Session - 09/24/16 1507    Visit Number 23   Number of Visits 35   Date for PT Re-Evaluation 10/06/16   Authorization Type [employer]    Authorization Time Period 09/08/16 to 11/08/16   Authorization - Visit Number 23   Authorization - Number of Visits 29   PT Start Time 1430   PT Stop Time 1509   PT Time Calculation (min) 39 min   Equipment Utilized During Treatment Gait belt   Activity Tolerance Patient tolerated treatment well   Behavior During Therapy [facility] for tasks assessed/performed      Past Medical History:  Diagnosis Date  . Cataract   . Colon polyps   . Dysphagia   . Essential hypertension, benign   . Fatty liver   . Hyperplasia of prostate   . 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 Other and unspecified hyperlipidemia   . Schizophrenia [facility])     Past Surgical History:  Procedure Laterality Date  . COLONOSCOPY  02/13/08  . COLONOSCOPY N/A 07/10/2012   Procedure: COLONOSCOPY;  Surgeon: Rogene Houston, MD;  Location: [facility];  Service: Endoscopy;  Laterality: N/A;  225  . UPPER GASTROINTESTINAL ENDOSCOPY  EGD ED   09/09/2010  . URETHRAL DILATION  1965    There were no vitals filed for this visit.      Subjective Assessment - 09/24/16 1434    Subjective PT states that he has not been able to work on his HEP    Pertinent History Parkinson.  Schizophrenia.   Patient Stated Goals get up from chairs better too, get moving aroudn better    Currently in Pain? No/denies   Pain Onset More than a month ago                         [facility] Adult PT Treatment/Exercise - 09/24/16 0001      Bed Mobility   Bed Mobility Rolling Right;Rolling Left     Knee/Hip Exercises: Stretches   Other Knee/Hip Stretches LTR with arms opposite direction  x 10; dead bug x 10, reciprocal heel slide x 10      Knee/Hip Exercises: Seated   Long Arc Quad Limitations opposite arm/leg raise AA x 10   Heel Slides Limitations Rotation reaching behind Rt shoulder with left hand/ then reverse x 10    Marching Limitations 10   Hamstring Limitations ankle dorsiflexion/plantarflexion x 10    Sit to [employer] reach cone on floor at opposite foot raise up into the      Air(PNF pattern) 5 reps      Knee/Hip Exercises: Supine   Bridges Limitations 10   Other Supine Knee/Hip Exercises LE butterflies x 10                   PT Short Term Goals - 09/08/16 1804      PT SHORT TERM GOAL #1   Title patient to be able to complete all bed mobility with Mod(I) on familiar and unfamiliar surfaces in order to improve mobility and enhance independence    Time 4  Period Weeks   Status New     PT SHORT TERM GOAL #2   Title Patient to complete TUG test within 13 seconds in order to show improved general mobility and gait efficiency    Time 4   Period Weeks   Status New     PT SHORT TERM GOAL #3   Title Patient to be able to complete 5xSTS in 20 seconds or less without freezing episode in order to show improved mobilty and efficiency of movement    Time 4   Period Weeks   Status New     PT SHORT TERM GOAL #4   Title Patient to be able to verbalize and demonstrate methods to "break through" freezing episodes and improve safety and efficiency of gait/general mobility    Time 4   Period Weeks   Status New     PT SHORT TERM GOAL #5   Title Patient to be compliant with appropriate HEP, to be updated PRN    Time 1   Period Weeks   Status New           PT Long Term Goals - 09/08/16 1806      PT LONG TERM GOAL #1   Title Patient to demonstrate MMT as being 5/5 in all tested groups in  order to improve balance and general mobility    Time 8   Period Weeks   Status New     PT LONG TERM GOAL #2   Title Patient to demonstrate at least 30-40% reduction in bradykinesia in order to improve general movement pattern and QOL    Time 8   Period Weeks   Status New     PT LONG TERM GOAL #3   Title Patient to score at least 18 on DGI in order to show improved dynamic balance and reduced fall risk    Time 8   Period Weeks   Status New     PT LONG TERM GOAL #4   Title Patient to be able to ambulate at least 624ft druing 3MWT in order to show improved mobility and community access    Time 8   Period Weeks   Status New     PT LONG TERM GOAL #5   Title Patient to be able to perform retrogait pattern with equal step lengths and no LOB in order to show improved mobiilty and balance as well as to assist in improving normal gait pattern    Time 8   Period Weeks   Status New               Plan - 09/24/16 1508    Clinical Impression Statement Pt states that he forgot his cane.  Pt treatment mainly focused on seated reciprocal motion.  Worked on bed mobilty and sit to stand per pt goals    Rehab Potential Good   Clinical Impairments Affecting Rehab Potential (+) motivated to participate in skilled PT services, disease process very addressable by PT   PT Frequency 2x / week   PT Duration 8 weeks   PT Treatment/Interventions ADLs/Self Care Home Management;Electrical Stimulation;Moist Heat;Ultrasound;Patient/family education;Therapeutic exercise;Therapeutic activities;Manual techniques;Passive range of motion;Vasopneumatic Device;Dry needling   PT Next Visit Plan continue starfish stretch, standing PWR and seated PWR. Gait training with SPC. Trial gait with laser. Freezing strategies    PT Home Exercise Plan starfish stretch    Consulted and Agree with Plan of Care Patient      Patient will benefit from skilled  therapeutic intervention in order to improve the following  deficits and impairments:  Abnormal gait, Improper body mechanics, Decreased coordination, Decreased mobility, Postural dysfunction, Decreased range of motion, Decreased strength, Hypomobility, Decreased balance, Difficulty walking, Decreased safety awareness  Visit Diagnosis: Unsteadiness on feet  Muscle weakness (generalized)  Other abnormalities of gait and mobility     Problem List Patient Active Problem List   Diagnosis Date Noted  . Parkinson disease ([place]) 09/05/2015  . Vitamin D deficiency 09/05/2015  . Metabolic syndrome 87/56/4332  . Hypertension 04/02/2013  . Hyperlipidemia 04/02/2013  . BPH (benign prostatic hyperplasia) 04/02/2013  . Left arm weakness 11/21/2012  . Parkinsonism ([place]) 11/21/2012  . Neck pain 11/21/2012  . Colon adenomas 06/29/2012  . Rectal mass 06/29/2012  . Schizophrenia ([place]) 06/22/2012  . Dysphagia 06/22/2011  . Eosinophilic esophagitis 95/18/8416    Rayetta Humphrey, PT CLT [phone] 09/24/2016, 3:12 PM  [place] [address] [place], [place], [address] Phone: [phone]   Fax:  [phone]  Name: KAMDYN COLBORN MRN: [id number] Date of Birth: 15-Jul-1960

## 2016-09-28 ENCOUNTER — Telehealth: Payer: Self-pay | Admitting: Diagnostic Neuroimaging

## 2016-09-28 NOTE — Telephone Encounter (Signed)
[name] patients sister (medical POA) called office in reference to patients appointment tomorrow at 9:30am.  She would like to speak with Dr. Leta Baptist privately for a few moments.  FYI

## 2016-09-29 ENCOUNTER — Ambulatory Visit (HOSPITAL_COMMUNITY): Payer: PPO | Admitting: Physical Therapy

## 2016-09-29 ENCOUNTER — Ambulatory Visit (HOSPITAL_COMMUNITY): Payer: PPO | Admitting: Occupational Therapy

## 2016-09-29 ENCOUNTER — Telehealth: Payer: Self-pay | Admitting: Family Medicine

## 2016-09-29 ENCOUNTER — Encounter: Payer: Self-pay | Admitting: Diagnostic Neuroimaging

## 2016-09-29 ENCOUNTER — Ambulatory Visit (INDEPENDENT_AMBULATORY_CARE_PROVIDER_SITE_OTHER): Payer: PPO | Admitting: Diagnostic Neuroimaging

## 2016-09-29 VITALS — BP 103/67 | HR 74 | Ht 72.0 in | Wt 173.0 lb

## 2016-09-29 DIAGNOSIS — M4802 Spinal stenosis, cervical region: Secondary | ICD-10-CM | POA: Diagnosis not present

## 2016-09-29 DIAGNOSIS — G20 Parkinson's disease: Secondary | ICD-10-CM

## 2016-09-29 NOTE — Progress Notes (Signed)
[facility]  PATIENT: Mark Benjamin DOB: [birth year]  REFERRING CLINICIAN: Alphonse Guild HISTORY FROM: patient and sister REASON FOR VISIT: follow up   HISTORICAL  CHIEF COMPLAINT:  Chief Complaint  Patient presents with  . Follow-up  . parkinsons disease    Much worse.  Trouble swallowing occasionally, drooling constant.  Has PACE interview Friday.  Looking into SN Please re commend.  Had fall in bathroom, no injuries.  3 wks. ago.  Using cane most of time, walker occasionally    HISTORY OF PRESENT ILLNESS:   UPDATE 09/29/16: Since last visit, tremors and coordination have worsened. Balance poor. Had a minor fall 3 weeks ago. Sister is looking at options for PACE vs assisted living. She is concerned about his safety to live alone. He is no longer driving. They were not able to afford [employer] ($14k out of pocket).   UPDATE 06/15/16: Since last visit, tremor, slowness, stiffness continue. Here to look at other options for treatment. Schizophrenia stable. More balance and coordination issues. Patient lives alone. Driving short distances.   UPDATE 09/22/15 (VRP): Since last visit, tremor and parkinsonism sxs continue. Saw Dr. Olga Millers, but was told that his spine issues are not amenable to surgical treatment.   UPDATE 06/04/14 (VRP): Since last visit, patient has transitioned from thioridazine to risperdal and cogentin and depakote. Left arm symptoms are stable / slightly improved. Patient not interested in surgical treatment of c-spine surg; also, was told by neurosurg that his neck issues do not need surg decompression.  UPDATE 08/10/13 (LL): Patient states that he was evaluated by Dr. Vertell Limber for cervical stenosis and was told that there was nothing seen on Myelogram that needed surgery. His Thioridazine dose is now 100 mg. Symptoms are stable. He was advised by PCP to cut out artificial sweeteners to see if that changed his symptoms; he did so 2 weeks ago. He complains of  mild left leg weakness.  UPDATE 02/26/13 (VRP): Since last visit patient had MRI of the brain and cervical spine which shows moderate spinal stenosis at C5-6 level with severe bilateral foraminal stenosis. Also mild spinal stenosis and moderate right foraminal stenosis at C6-7. No cord signal abnormalities. Since last visit thioridazine dosing has been reduced. Overall his symptoms are stable to slightly progressed.  PRIOR HPI (11/21/12, VRP): 56 year old left-handed male with history of diabetes, hypercholesterolemia, schizophrenia, on thioridazine since 1986, here for evaluation of left arm weakness and numbness since March 2014. Patient reports gradual onset, progressive numbness and weakness and slowness of his left arm. He denies any symptoms in his right arm or legs. No facial droop or facial numbness. He has noticed some slurred speech over the past one week. Patient is left-handed and has noticed difficulty with brushing his teeth with his left hand, handwriting and other fine movements. Patient's psychiatrist reduced thioridazine dose from 300 mg at bedtime down to 200 mg at bedtime last month, out of consideration of the patient's presenting symptoms as a potential side effect of medication. Since reducing the dose, no change in symptoms.   REVIEW OF SYSTEMS: Full 14 system review of systems performed and negative: dizziness speech diff tremors decr concentration drooling wt loss incontinence.    ALLERGIES: Allergies  Allergen Reactions  . Amoxicillin     Dizzy and blurred vision  . Erythromycin   . Sulfamethoxazole-Trimethoprim Other (See Comments)    Extreme weakness/lethorgy  . Zocor [Simvastatin]     HOME MEDICATIONS: Outpatient Medications Prior to Visit  Medication Sig Dispense Refill  . benztropine (COGENTIN) 1 MG tablet Take 1 tablet by mouth 2 (two) times daily.    . Cholecalciferol (VITAMIN D3) 1000 UNITS CAPS Take 3 tablets by mouth daily.     Marland Kitchen diltiazem 2 % GEL Apply 1  application topically 2 (two) times daily. (Patient taking differently: Apply 1 application topically as needed. ) 30 g 2  . divalproex (DEPAKOTE) 250 MG DR tablet Take 250 mg by mouth 2 (two) times daily. Take 250mg  BID    . docusate sodium (COLACE) 100 MG capsule Take 100 mg by mouth 2 (two) times daily.    . fosinopril-hydrochlorothiazide (MONOPRIL-HCT) 10-12.5 MG tablet Take 1 Tablet by mouth once daily 90 tablet 1  . Multiple Vitamin (MULTIVITAMIN) tablet Take 1 tablet by mouth daily. For 50 Plus    . naproxen (NAPROSYN) 500 MG tablet Take 1 Tablet by mouth 2 times a day with meals 60 tablet 1  . rosuvastatin (CRESTOR) 20 MG tablet Take 1 Tablet by mouth once daily 90 tablet 0  . risperiDONE (RISPERDAL) 2 MG tablet Take 2 mg by mouth 2 (two) times daily.     No facility-administered medications prior to visit.     PAST MEDICAL HISTORY: Past Medical History:  Diagnosis Date  . Cataract   . Colon polyps   . Dysphagia   . Essential hypertension, benign   . Fatty liver   . Hyperplasia of prostate   . 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 Other and unspecified hyperlipidemia   . Schizophrenia ([place])     PAST SURGICAL HISTORY: Past Surgical History:  Procedure Laterality Date  . COLONOSCOPY  02/13/08  . COLONOSCOPY N/A 07/10/2012   Procedure: COLONOSCOPY;  Surgeon: Rogene Houston, MD;  Location: [facility];  Service: Endoscopy;  Laterality: N/A;  225  . UPPER GASTROINTESTINAL ENDOSCOPY  EGD ED   09/09/2010  . URETHRAL DILATION  1965    FAMILY HISTORY: Family History  Problem Relation Age of Onset  . Heart failure Mother   . ALS Father   . Hyperlipidemia Sister   . Inflammatory bowel disease Paternal Grandfather     SOCIAL HISTORY:  Social History   Social History  . Marital status: Single    Spouse name: N/A  . Number of children: 0  . Years of education: 12th   Occupational History  .  Not Employed   Social History Main Topics  .  Smoking status: Never Smoker  . Smokeless tobacco: Never Used  . Alcohol use Yes     Comment: very rare  . Drug use: No  . Sexual activity: No   Other Topics Concern  . Not on file   Social History Narrative   06/15/16 Patient lives alone in apartment.   Caffeine Use: none   Patient is both right and left handed.     PHYSICAL EXAM  Vitals:   09/29/16 0918  BP: 103/67  Pulse: 74  Weight: 173 lb (78.5 kg)  Height: 6' (1.829 m)    Not recorded      Body mass index is 23.46 kg/m.  GENERAL EXAM: Patient is in no distress  CARDIOVASCULAR: Regular rate and rhythm, no murmurs, no carotid bruits  NEUROLOGIC: MENTAL STATUS: awake, alert, language fluent, comprehension intact, naming intact; SEVERE MASKED FACIES. SEVERE PAUCITY OF SPEECH OUTPUT CRANIAL NERVE: pupils equal and reactive to light, visual fields full to confrontation, extraocular muscles intact, no nystagmus, facial sensation and  strength symmetric, uvula midline, shoulder shrug symmetric, tongue midline. MOTOR: INCREASED TONE IN LUE WITH RARE, MINOR REST TREMOR IN LUE. SIGNIFICANT BRADYKINESIA IN LUE. RUE AND BLE 5. LUE FINGER ABDUCTION AND EXTENSION 4.  SENSORY: normal and symmetric to light touch, temperature, vibration COORDINATION: finger-nose-finger, fine finger movements normal REFLEXES: RUE 3, LUE 2, KNEES 2, ANKLES 2 GAIT/STATION: narrow based gait; GAIT FREEZING; STOOPED POSTURE; SHORT STEPS; ABSENT ARM SWING. USING A CANE.    DIAGNOSTIC DATA (LABS, IMAGING, TESTING) - I reviewed patient records, labs, notes, testing and imaging myself where available.  Lab Results  Component Value Date   WBC 5.4 06/03/2016   HGB 13.7 06/03/2016   HCT 39.2 06/03/2016   MCV 88 06/03/2016   PLT 191 06/03/2016      Component Value Date/Time   NA 139 06/03/2016 1224   K 3.9 06/03/2016 1224   CL 97 06/03/2016 1224   CO2 25 06/03/2016 1224   GLUCOSE 90 06/03/2016 1224   GLUCOSE 94 04/24/2013 0928   BUN 9  06/03/2016 1224   CREATININE 0.90 06/03/2016 1224   CREATININE 0.86 04/24/2013 0928   CALCIUM 9.7 06/03/2016 1224   PROT 6.7 06/03/2016 1224   ALBUMIN 4.7 06/03/2016 1224   AST 21 06/03/2016 1224   ALT 15 06/03/2016 1224   ALKPHOS 49 06/03/2016 1224   BILITOT 1.6 (H) 06/03/2016 1224   GFRNONAA 96 06/03/2016 1224   GFRNONAA >89 04/24/2013 0928   GFRAA 111 06/03/2016 1224   GFRAA >89 04/24/2013 0928   Lab Results  Component Value Date   CHOL 110 06/03/2016   HDL 50 06/03/2016   LDLCALC 50 06/03/2016   TRIG 48 06/03/2016   CHOLHDL 2.2 06/03/2016   Lab Results  Component Value Date   HGBA1C 4.8 11/08/2013   Lab Results  Component Value Date   ALPFXTKW40 973 06/03/2016   Lab Results  Component Value Date   TSH 1.310 06/03/2016    11/29/12 MRI cervical  1. At C5-6: disc bulging with moderate spinal stenosis and severe biforaminal stenosis  2. At C3-4, C4-5, C6-7: disc bulging with mild spinal stenosis and severe biforaminal stenosis  3. No cord signal abnormalities.  11/29/12 MRI brain (without) demonstrating:  1. Single right frontal subcortical focus (72mm) of non-specific gliosis.  2. No acute findings.  04/12/13 CT myelogram cervical spine - Central disc herniation at C3-4 with moderate central stenosis. Severe left foraminal narrowing at this level. Moderate central stenosis at C5-6 and C6-7 secondary to posterior disc osteophytes.  06/05/15 MRI cervical spine [I reviewed images myself and agree with interpretation. -VRP]  1. At C3-4 there is a mild broad-based disc bulge. Left uncovertebral degenerative change and left facet arthropathy resulting in severe left foraminal stenosis. Mild right foraminal stenosis. Mild spinal stenosis. 2. Cervical spine spondylosis as described above.     ASSESSMENT AND PLAN  56 y.o. year old male here with gradual onset, progressive left arm weakness, slowness and stiffness with some numbness. On exam, patient has masked facies,  rigidity in the left arm, bradykinesia in the left arm, stooped posture and poor arm swing.  Dx: parkinsonism (likely idiopathic parkinson's disease; drug induced less likely as symptoms have continued to progress in spite of reducing anti-psychotic medications) + mild cervical spinal stenosis and cervical radiculopathy  Parkinson's disease ([place])  Spinal stenosis in cervical region    PLAN:  I spent 25 minutes of face to face time with patient. Greater than 50% of time was spent in counseling and  coordination of care with patient. In summary we discussed:   PARKINSON'S DISEASE (established problem, worsening) - increase safety and supervision; caution with living alone and driving - continue physical activity / exercises / PT - ordered DATscan to eval for parkinson's disease vs drug-induced parkinsonism --> but not affordable at this time - continue to use lowest possible dose of anti-psychotic medication (Dr. Letitia Caul; psychiatry) - in future, may consider low dose trial of carbidopa/levodopa to help with parkinsonism symptoms (slow movements, stiffness, tremor), but this could exacerbate schizophrenia symptoms; will wait until patient is in more supervised environment  Return in about 4 months (around 01/30/2017).    Penni Bombard, MD 2/37/6283, 1:51 AM Certified in Neurology, Neurophysiology and Neuroimaging  [facility] [address] [place], [place] [address] [phone]

## 2016-09-30 ENCOUNTER — Telehealth: Payer: Self-pay

## 2016-09-30 NOTE — Telephone Encounter (Signed)
[name] called and patient is moving to assisted living. He will not get penalized when he breaks his lease with his current apartment if they get a letter from his doctor. Wanting letter to say- It is medically neccessary for patients safety and supervision purpose for patient to move to assisted living. Would like Jamie to call her when it is done. Aware they both are out of the office until Monday- verbalizes understanding.

## 2016-09-30 NOTE — Telephone Encounter (Signed)
Mark Benjamin said there was no need in [place] calling her back. She just wanted to make sure the notes from his apt yesterday at [facility] was in his chart- which they are.

## 2016-10-01 ENCOUNTER — Ambulatory Visit (HOSPITAL_COMMUNITY): Payer: PPO

## 2016-10-01 ENCOUNTER — Encounter (HOSPITAL_COMMUNITY): Payer: Self-pay | Admitting: Occupational Therapy

## 2016-10-01 ENCOUNTER — Ambulatory Visit (HOSPITAL_COMMUNITY): Payer: PPO | Admitting: Occupational Therapy

## 2016-10-01 DIAGNOSIS — M6281 Muscle weakness (generalized): Secondary | ICD-10-CM

## 2016-10-01 DIAGNOSIS — R29818 Other symptoms and signs involving the nervous system: Secondary | ICD-10-CM | POA: Diagnosis not present

## 2016-10-01 DIAGNOSIS — R278 Other lack of coordination: Secondary | ICD-10-CM

## 2016-10-01 DIAGNOSIS — R2689 Other abnormalities of gait and mobility: Secondary | ICD-10-CM

## 2016-10-01 DIAGNOSIS — R2681 Unsteadiness on feet: Secondary | ICD-10-CM

## 2016-10-01 NOTE — Telephone Encounter (Signed)
Please do a letter documenting this information in the letter so patient can move to assisted living without penalty

## 2016-10-01 NOTE — Therapy (Signed)
[place], [place], [address] Phone: [phone]   Fax:  [phone]  Occupational Therapy Treatment  Patient Details  Name: Mark Benjamin MRN: [id number] Date of Birth: [birth year] No Data Recorded  Encounter Date: 10/01/2016      OT End of Session - 10/01/16 1627    Visit Number 10   Number of Visits 16   Date for OT Re-Evaluation 10/22/16   Authorization Type [employer]   Authorization Time Period Before 18th visit   Authorization - Visit Number 10   Authorization - Number of Visits 18   OT Start Time 1433   OT Stop Time 1515   OT Time Calculation (min) 42 min   Activity Tolerance Patient tolerated treatment well   Behavior During Therapy [facility] for tasks assessed/performed      Past Medical History:  Diagnosis Date  . Cataract   . Colon polyps   . Dysphagia   . Essential hypertension, benign   . Fatty liver   . Hyperplasia of prostate   . 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 Other and unspecified hyperlipidemia   . Schizophrenia [facility])     Past Surgical History:  Procedure Laterality Date  . COLONOSCOPY  02/13/08  . COLONOSCOPY N/A 07/10/2012   Procedure: COLONOSCOPY;  Surgeon: Rogene Houston, MD;  Location: [facility];  Service: Endoscopy;  Laterality: N/A;  225  . UPPER GASTROINTESTINAL ENDOSCOPY  EGD ED   09/09/2010  . URETHRAL DILATION  1965    There were no vitals filed for this visit.      Subjective Assessment - 10/01/16 1621    Subjective  S: The doctor said he might put me on some medication.    Currently in Pain? No/denies            [facility] OT Assessment - 10/01/16 1620      Assessment   Diagnosis Weakness; Parkinson's      Precautions   Precautions Fall                  OT Treatments/Exercises (OP) - 10/01/16 1449      Exercises   Exercises Neurological Re-education;Shoulder     Neurological Re-education Exercises    Wrist Flexion PROM;5 reps;AROM;10 reps   Wrist Extension PROM;5 reps;AROM;10 reps  sustained passive stretch   Finger Flexion A/ROM 10X   Finger Extension A/ROM 10X   Other Exercises 1 Yellow theraputty: pt flattened putty using LUE focusing on wrist extension, mod assist at elbow to prevent buckling; pt pulled fingers of left hand through putty to work on IP flexion; lateral pinch around edge of putty, mod difficulty for sustaining index finger in flexion at IP joints   Other Exercises 2 Prayer stretch for wrist extension, 3X, 10 second hold.   Hand Gripper with Large Beads all beads gripper at 25#   Hand Gripper with Medium Beads all beads gripper at 22#     Functional Reaching Activities   Mid Level Pt placed yellow, red, and 3 green clothespins along vertical bar of pinch tree. 2 episodes of freezing, resolved with counting/speaking. Pt with mod/max difficulty at 50% range with shoulder. OT cuing to use 3 point pinch and slightly flex IP joints                  OT Short Term Goals - 08/30/16 1216      OT SHORT TERM GOAL #1  Title Pt will benefit from HEP to improve mobility and functioning required for ADL completion.    Time 4   Period Weeks   Status On-going     OT SHORT TERM GOAL #2   Title Pt will improve LUE strength to improve ability to perform overhead reaching using LUE as dominant.    Time 4   Period Weeks   Status On-going     OT SHORT TERM GOAL #3   Title Pt will improve left grip strength by 15# and pinch strength by 3# to improve ability to grasp and hold cups and utensils.   Time 4   Period Weeks   Status On-going     OT SHORT TERM GOAL #4   Title Pt will improve ability to button shirts and tie shoes by completing 9 hole peg test in 1 min or less.    Time 4   Period Weeks   Status On-going     OT SHORT TERM GOAL #5   Title Pt will complete ADL tasks-grooming, dressing, toilet hygiene-in a timely manner by donning shirts in 10 min or less.     Time 4   Period Weeks   Status On-going     OT SHORT TERM GOAL #6   Title Pt will improve LUE strength to 4/5 to improve ability to perform ADL tasks while using LUE as dominant.    Time 4   Period Weeks   Status On-going     OT SHORT TERM GOAL #7   Title Pt will complete functional reaching tasks with improved mobility taking 2 steps or less towards an object at distance of 3 feet away.    Time 4   Period Weeks   Status On-going                  Plan - 10/01/16 1627    Clinical Impression Statement A: Continued working on LUE grasp, strength, and reaching this session. OT providing hand over hand assist for maximum wrist extension during theraputty task. Pt able to increase gripper strength this session, also able to complete pinch activity. Increased time due to freezing and fatigue. Pt reports he went to the MD and they might begin medication in the future.    Plan P: continue with PWR! exercises, left hand grasp and release, wrist ROM   OT Home Exercise Plan red theraputty for grip and pinch strengthening; 6/15: hand and finger A/ROM; 6/22: scrubbing protocol, starfish stretch   Consulted and Agree with Plan of Care Patient      Patient will benefit from skilled therapeutic intervention in order to improve the following deficits and impairments:  Decreased endurance, Decreased activity tolerance, Decreased knowledge of use of DME, Decreased strength, Impaired flexibility, Decreased balance, Decreased mobility, Pain, Impaired tone, Decreased range of motion, Decreased coordination, Decreased safety awareness, Increased fascial restricitons, Impaired UE functional use  Visit Diagnosis: Other lack of coordination  Other symptoms and signs involving the nervous system    Problem List Patient Active Problem List   Diagnosis Date Noted  . Parkinson disease ([place]) 09/05/2015  . Vitamin D deficiency 09/05/2015  . Metabolic syndrome 35/36/1443  . Hypertension  04/02/2013  . Hyperlipidemia 04/02/2013  . BPH (benign prostatic hyperplasia) 04/02/2013  . Left arm weakness 11/21/2012  . Parkinsonism ([place]) 11/21/2012  . Neck pain 11/21/2012  . Colon adenomas 06/29/2012  . Rectal mass 06/29/2012  . Schizophrenia ([place]) 06/22/2012  . Dysphagia 06/22/2011  . Eosinophilic esophagitis 15/40/0867  Guadelupe Sabin, OTR/L  [phone] 10/01/2016, 4:29 PM  [place] [address] [place], [place], [address] Phone: [phone]   Fax:  [phone]  Name: Mark Benjamin MRN: [id number] Date of Birth: 01-16-1961

## 2016-10-01 NOTE — Therapy (Signed)
[facility] [address] [place], [place], [address] Phone: [phone]   Fax:  [phone]  Physical Therapy Treatment  Patient Details  Name: Mark Benjamin MRN: [id number] Date of Birth: May 31, 1960 Referring Provider: Redge Gainer   Encounter Date: 10/01/2016      PT End of Session - 10/01/16 1521    Visit Number 24   Number of Visits 35   Date for PT Re-Evaluation 10/06/16   Authorization Type [employer]    Authorization Time Period 09/08/16 to 11/08/16   Authorization - Visit Number 24   PT Start Time 1520   PT Stop Time 1559   PT Time Calculation (min) 39 min   Equipment Utilized During Treatment Gait belt   Activity Tolerance Patient tolerated treatment well   Behavior During Therapy [facility] for tasks assessed/performed      Past Medical History:  Diagnosis Date  . Cataract   . Colon polyps   . Dysphagia   . Essential hypertension, benign   . Fatty liver   . Hyperplasia of prostate   . 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 Other and unspecified hyperlipidemia   . Schizophrenia [facility])     Past Surgical History:  Procedure Laterality Date  . COLONOSCOPY  02/13/08  . COLONOSCOPY N/A 07/10/2012   Procedure: COLONOSCOPY;  Surgeon: Rogene Houston, MD;  [facility];  Service: Endoscopy;  Laterality: N/A;  225  . UPPER GASTROINTESTINAL ENDOSCOPY  EGD ED   09/09/2010  . URETHRAL DILATION  1965    There were no vitals filed for this visit.      Subjective Assessment - 10/01/16 1520    Subjective Pt entered dept with SPC, reports he has not been working on his HEP as often as he should.   Patient Stated Goals get up from chairs better too, get moving aroudn better    Currently in Pain? No/denies                         [facility] Adult PT Treatment/Exercise - 10/01/16 1641      Knee/Hip Exercises: Aerobic   Nustep L3 x 10 min with therapist facilitation for  mechanics (counting 1, 2... to reduce freeze)     Knee/Hip Exercises: Seated   Long Arc Quad Limitations opposite arm/leg raise AA x 10   Other Seated Knee/Hip Exercises reciprocal arm flexion   Marching Limitations 10     Knee/Hip Exercises: Supine   Bridges Limitations 10   Other Supine Knee/Hip Exercises Reciprocal UE flexion; reciprocal LE marching; then combined (dead bug) reciprocal                  PT Short Term Goals - 09/08/16 1804      PT SHORT TERM GOAL #1   Title patient to be able to complete all bed mobility with Mod(I) on familiar and unfamiliar surfaces in order to improve mobility and enhance independence    Time 4   Period Weeks   Status New     PT SHORT TERM GOAL #2   Title Patient to complete TUG test within 13 seconds in order to show improved general mobility and gait efficiency    Time 4   Period Weeks   Status New     PT SHORT TERM GOAL #3   Title Patient to be able to complete 5xSTS in 20 seconds or less without freezing  episode in order to show improved mobilty and efficiency of movement    Time 4   Period Weeks   Status New     PT SHORT TERM GOAL #4   Title Patient to be able to verbalize and demonstrate methods to "break through" freezing episodes and improve safety and efficiency of gait/general mobility    Time 4   Period Weeks   Status New     PT SHORT TERM GOAL #5   Title Patient to be compliant with appropriate HEP, to be updated PRN    Time 1   Period Weeks   Status New           PT Long Term Goals - 09/08/16 1806      PT LONG TERM GOAL #1   Title Patient to demonstrate MMT as being 5/5 in all tested groups in order to improve balance and general mobility    Time 8   Period Weeks   Status New     PT LONG TERM GOAL #2   Title Patient to demonstrate at least 30-40% reduction in bradykinesia in order to improve general movement pattern and QOL    Time 8   Period Weeks   Status New     PT LONG TERM GOAL #3    Title Patient to score at least 18 on DGI in order to show improved dynamic balance and reduced fall risk    Time 8   Period Weeks   Status New     PT LONG TERM GOAL #4   Title Patient to be able to ambulate at least 636ft druing 3MWT in order to show improved mobility and community access    Time 8   Period Weeks   Status New     PT LONG TERM GOAL #5   Title Patient to be able to perform retrogait pattern with equal step lengths and no LOB in order to show improved mobiilty and balance as well as to assist in improving normal gait pattern    Time 8   Period Weeks   Status New               Plan - 10/01/16 1754    Clinical Impression Statement Continued session focus with reciprocal movements.  Pt brought his cane this session but tends to carry [facility] rather than utilize it.  Gait training with constant cueing to improve gait mechanics with cueing for proper sequence wiht cane and to increase stride length.  Pt limited by fatigue, no reports of pain through session.     Rehab Potential Good   Clinical Impairments Affecting Rehab Potential (+) motivated to participate in skilled PT services, disease process very addressable by PT   PT Frequency 2x / week   PT Duration 8 weeks   PT Treatment/Interventions ADLs/Self Care Home Management;Electrical Stimulation;Moist Heat;Ultrasound;Patient/family education;Therapeutic exercise;Therapeutic activities;Manual techniques;Passive range of motion;Vasopneumatic Device;Dry needling   PT Next Visit Plan continue starfish stretch, standing PWR and seated PWR. Gait training with SPC. Trial gait with laser. Freezing strategies       Patient will benefit from skilled therapeutic intervention in order to improve the following deficits and impairments:  Abnormal gait, Improper body mechanics, Decreased coordination, Decreased mobility, Postural dysfunction, Decreased range of motion, Decreased strength, Hypomobility, Decreased balance, Difficulty  walking, Decreased safety awareness  Visit Diagnosis: Other lack of coordination  Other symptoms and signs involving the nervous system  Unsteadiness on feet  Muscle weakness (generalized)  Other abnormalities of  gait and mobility     Problem List Patient Active Problem List   Diagnosis Date Noted  . Parkinson disease ([place]) 09/05/2015  . Vitamin D deficiency 09/05/2015  . Metabolic syndrome 25/49/8264  . Hypertension 04/02/2013  . Hyperlipidemia 04/02/2013  . BPH (benign prostatic hyperplasia) 04/02/2013  . Left arm weakness 11/21/2012  . Parkinsonism ([place]) 11/21/2012  . Neck pain 11/21/2012  . Colon adenomas 06/29/2012  . Rectal mass 06/29/2012  . Schizophrenia ([place]) 06/22/2012  . Dysphagia 06/22/2011  . Eosinophilic esophagitis 15/83/0940   Ihor Austin, LPTA; CBIS [phone]  Aldona Lento 10/01/2016, 5:59 PM  [place], [place], [address] Phone: [phone]   Fax:  [phone]  Name: KEIGO WHALLEY MRN: [id number] Date of Birth: 01-Sep-1960

## 2016-10-01 NOTE — Telephone Encounter (Signed)
Please call sister when "her is completed and signed

## 2016-10-04 ENCOUNTER — Telehealth: Payer: Self-pay | Admitting: Family Medicine

## 2016-10-04 NOTE — Telephone Encounter (Signed)
Sister aware that letter is being written - she also wanted to let us know that FL2 is coming for [facility]

## 2016-10-04 NOTE — Telephone Encounter (Signed)
Typed letter and gave to [facility]

## 2016-10-04 NOTE — Telephone Encounter (Signed)
Can u do this letter?

## 2016-10-05 ENCOUNTER — Ambulatory Visit (HOSPITAL_COMMUNITY): Payer: PPO | Admitting: Occupational Therapy

## 2016-10-05 ENCOUNTER — Telehealth (HOSPITAL_COMMUNITY): Payer: Self-pay | Admitting: Physical Therapy

## 2016-10-05 ENCOUNTER — Ambulatory Visit (HOSPITAL_COMMUNITY): Payer: PPO | Admitting: Physical Therapy

## 2016-10-05 ENCOUNTER — Encounter (HOSPITAL_COMMUNITY): Payer: Self-pay | Admitting: Occupational Therapy

## 2016-10-05 DIAGNOSIS — R29818 Other symptoms and signs involving the nervous system: Secondary | ICD-10-CM

## 2016-10-05 DIAGNOSIS — M6281 Muscle weakness (generalized): Secondary | ICD-10-CM

## 2016-10-05 DIAGNOSIS — R2681 Unsteadiness on feet: Secondary | ICD-10-CM

## 2016-10-05 DIAGNOSIS — R2689 Other abnormalities of gait and mobility: Secondary | ICD-10-CM

## 2016-10-05 DIAGNOSIS — R278 Other lack of coordination: Secondary | ICD-10-CM

## 2016-10-05 NOTE — Therapy (Addendum)
[place], [place], [address] Phone: [phone]   Fax:  [phone]  Occupational Therapy Treatment  Patient Details  Name: Mark Benjamin MRN: [id number] Date of Birth: 04-Nov-1960 No Data Recorded  Encounter Date: 10/05/2016      OT End of Session - 10/05/16 1626    Visit Number 11   Number of Visits 16   Date for OT Re-Evaluation 10/22/16   Authorization Type [employer]   Authorization Time Period Before 18th visit   Authorization - Visit Number 11   Authorization - Number of Visits 18   OT Start Time 1435   OT Stop Time 1515   OT Time Calculation (min) 40 min   Activity Tolerance Patient tolerated treatment well   Behavior During Therapy [facility] for tasks assessed/performed      Past Medical History:  Diagnosis Date  . Cataract   . Colon polyps   . Dysphagia   . Essential hypertension, benign   . Fatty liver   . Hyperplasia of prostate   . 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 Other and unspecified hyperlipidemia   . Schizophrenia [facility])     Past Surgical History:  Procedure Laterality Date  . COLONOSCOPY  02/13/08  . COLONOSCOPY N/A 07/10/2012   Procedure: COLONOSCOPY;  Surgeon: Rogene Houston, MD;  [facility];  Service: Endoscopy;  Laterality: N/A;  225  . UPPER GASTROINTESTINAL ENDOSCOPY  EGD ED   09/09/2010  . URETHRAL DILATION  1965    There were no vitals filed for this visit.      Subjective Assessment - 10/05/16 1619    Subjective  S: My elbow is stiff today. (right)   Currently in Pain? No/denies            [facility] OT Assessment - 10/05/16 1619      Assessment   Diagnosis Weakness; Parkinson's      Precautions   Precautions Fall                  OT Treatments/Exercises (OP) - 10/05/16 1619      Exercises   Exercises Neurological Re-education;Shoulder     Neurological Re-education Exercises   Shoulder Flexion  AROM;Both;5 reps   Wrist Extension PROM;5 reps  sustained passive stretch   Other Exercises 1 Pt stood at tall mirror with pegboard positioned on back of mirror. Pt alternated using LUE and RUE to place large pegs into pegboard. Pt with mod difficulty using LUE to reach past waist height, OT providing min guard/min assist to counter bradykinesia and elevate LUE and push into protraction to place pegs. Pt alternated between tip pinch and 3 point pinch for peg placement. No difficulty placing pegs with RUE.    Other Exercises 2 While seated at mat table pt held "boomwackers" with LUE and RUE, following directions for reaching by PT. OT provided min guard/min assist for reaching with LUE and RUE approximately 75% of the time when reaching across midline and on the ipsilateral side. Pt had no difficulty maintaining hold of boomwackers, however had max difficulty with wrist extension with LUE when reaching to low level.                   OT Short Term Goals - 08/30/16 1216      OT SHORT TERM GOAL #1   Title Pt will benefit from HEP to improve mobility and functioning required for  ADL completion.    Time 4   Period Weeks   Status On-going     OT SHORT TERM GOAL #2   Title Pt will improve LUE strength to improve ability to perform overhead reaching using LUE as dominant.    Time 4   Period Weeks   Status On-going     OT SHORT TERM GOAL #3   Title Pt will improve left grip strength by 15# and pinch strength by 3# to improve ability to grasp and hold cups and utensils.   Time 4   Period Weeks   Status On-going     OT SHORT TERM GOAL #4   Title Pt will improve ability to button shirts and tie shoes by completing 9 hole peg test in 1 min or less.    Time 4   Period Weeks   Status On-going     OT SHORT TERM GOAL #5   Title Pt will complete ADL tasks-grooming, dressing, toilet hygiene-in a timely manner by donning shirts in 10 min or less.    Time 4   Period Weeks   Status On-going      OT SHORT TERM GOAL #6   Title Pt will improve LUE strength to 4/5 to improve ability to perform ADL tasks while using LUE as dominant.    Time 4   Period Weeks   Status On-going     OT SHORT TERM GOAL #7   Title Pt will complete functional reaching tasks with improved mobility taking 2 steps or less towards an object at distance of 3 feet away.    Time 4   Period Weeks   Status On-going                  Plan - 10/05/16 1627    Clinical Impression Statement A: Co-treatment completed with PT this session, OT focusing on LUE grasp and reaching as well as some fine motor coordination tasks. Pt complains of stiffness in the right elbow today which is a new complaint. At beginning of session, pt unable to reach into extension with elbow, however improved during reaching tasks. OT providing min guard/min assist for reaching tasks today to counter bradykinesia and improve reaching range. Spoke with sister who reports pt will be moving to assisted living and she will call to let us know if he will continue therapy here or if he will be transitioning there after they see the MD this week.    Plan P: follow up on ALF placement and MD appt   OT Home Exercise Plan red theraputty for grip and pinch strengthening; 6/15: hand and finger A/ROM; 6/22: scrubbing protocol, starfish stretch   Consulted and Agree with Plan of Care Patient      Patient will benefit from skilled therapeutic intervention in order to improve the following deficits and impairments:  Decreased endurance, Decreased activity tolerance, Decreased knowledge of use of DME, Decreased strength, Impaired flexibility, Decreased balance, Decreased mobility, Pain, Impaired tone, Decreased range of motion, Decreased coordination, Decreased safety awareness, Increased fascial restricitons, Impaired UE functional use  Visit Diagnosis: Other lack of coordination  Other symptoms and signs involving the nervous system    Problem  List Patient Active Problem List   Diagnosis Date Noted  . Parkinson disease ([place]) 09/05/2015  . Vitamin D deficiency 09/05/2015  . Metabolic syndrome 16/12/9602  . Hypertension 04/02/2013  . Hyperlipidemia 04/02/2013  . BPH (benign prostatic hyperplasia) 04/02/2013  . Left arm weakness 11/21/2012  . Parkinsonism (  [place]) 11/21/2012  . Neck pain 11/21/2012  . Colon adenomas 06/29/2012  . Rectal mass 06/29/2012  . Schizophrenia ([place]) 06/22/2012  . Dysphagia 06/22/2011  . Eosinophilic esophagitis 64/33/2951   Guadelupe Sabin, OTR/L  [phone] 10/05/2016, 4:35 PM  [facility] [address] [place], [place], [address] Phone: [phone]   Fax:  [phone]  Name: Mark Benjamin MRN: [id number] Date of Birth: [birth year]    OCCUPATIONAL THERAPY DISCHARGE SUMMARY  Visits from Start of Care: 11  Current functional level related to goals / functional outcomes: Pt has been moved to assisted living and [facility] services ordered. Pt continues to have difficulty with self-care tasks, as well as LUE rigidity and tone affecting functional use. Pt Parkinson's disease is progressing and very little improvements noted during therapy. Pt has done well with utilizing assistive devices during therapy sessions, unsure how much carryover there was at home.    Remaining deficits: Pt continues to have LUE increased tone, rigidity, weakness throughout LUE; as well as coordination and balance deficits affecting independence and safety during B/IADL completion.    Education / Equipment: Pt educated on stretching and A/ROM for left wrist and hand throughout therapy duration, as well as grip and pinch strengthening. Pt also educated on assistive devices to utilize during ADL completion.   Plan: Patient agrees to discharge.  Patient goals were not met. Patient is being discharged due to a change in medical status.  ?????

## 2016-10-05 NOTE — Therapy (Signed)
[place], [place], [address] Phone: [phone]   Fax:  [phone]  Physical Therapy Treatment  Patient Details  Name: Mark Benjamin MRN: [id number] Date of Birth: November 27, 1960 Referring Provider: Redge Gainer   Encounter Date: 10/05/2016      PT End of Session - 10/05/16 1623    Visit Number 25   Number of Visits 35   Date for PT Re-Evaluation 10/06/16   Authorization Type [employer]    Authorization Time Period 09/08/16 to 11/08/16   Authorization - Visit Number 25   Authorization - Number of Visits 29   PT Start Time 1410   PT Stop Time 1450  co-treat with OT, time split (would have been 65 minutes)   PT Time Calculation (min) 40 min   Equipment Utilized During Treatment Gait belt   Activity Tolerance Patient tolerated treatment well   Behavior During Therapy [facility] for tasks assessed/performed      Past Medical History:  Diagnosis Date  . Cataract   . Colon polyps   . Dysphagia   . Essential hypertension, benign   . Fatty liver   . Hyperplasia of prostate   . 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 Other and unspecified hyperlipidemia   . Schizophrenia [facility])     Past Surgical History:  Procedure Laterality Date  . COLONOSCOPY  02/13/08  . COLONOSCOPY N/A 07/10/2012   Procedure: COLONOSCOPY;  Surgeon: Rogene Houston, MD;  Location: [facility] ENDO SUITE;  Service: Endoscopy;  Laterality: N/A;  225  . UPPER GASTROINTESTINAL ENDOSCOPY  [facility] ED   09/09/2010  . URETHRAL DILATION  1965    There were no vitals filed for this visit.      Subjective Assessment - 10/05/16 1612    Subjective Patient arrives today stating that it is a fairly sure thing that he will be going to assisted living soon, just not sure on exact dates; his sister confirms this and reports they are unsure if they are going to continue with PT here or just get therapy at the assisted living cetner, they  will be sure to keep [place] updated as things progress on this front    Pertinent History Parkinson.  Schizophrenia.   Patient Stated Goals get up from chairs better too, get moving aroudn better    Currently in Pain? No/denies                         [facility] Adult PT Treatment/Exercise - 10/05/16 0001      Knee/Hip Exercises: Aerobic   Nustep Nustep level 0 iwth Mod-Max assist for full range peddling and to keep SPM at least 60-80beats. Mod verbal cues provided     Knee/Hip Exercises: Standing   Gait Training gait training with SPC, mod cues for correct sequencing    Other Standing Knee Exercises reaches on peg board in standard stance and semi-tandem stance with min guard, occasional min assist to recover balance      Knee/Hip Exercises: Seated   Other Seated Knee/Hip Exercises ipsi and contralateral reaches with boom whackers on and off of dynadisc; min guard provided by OT for reaching and balance. Min verbal cues for upright posture                 PT Education - 10/05/16 1622    Education provided Yes   Education Details gait training throughotu session, safety throughout  session, encouraged counting technique throughout session; POC somewhat TBD depending on what happens with assisted living placement    Person(s) Educated Patient   Methods Explanation   Comprehension Verbalized understanding          PT Short Term Goals - 09/08/16 1804      PT SHORT TERM GOAL #1   Title patient to be able to complete all bed mobility with Mod(I) on familiar and unfamiliar surfaces in order to improve mobility and enhance independence    Time 4   Period Weeks   Status New     PT SHORT TERM GOAL #2   Title Patient to complete TUG test within 13 seconds in order to show improved general mobility and gait efficiency    Time 4   Period Weeks   Status New     PT SHORT TERM GOAL #3   Title Patient to be able to complete 5xSTS in 20 seconds or less without freezing  episode in order to show improved mobilty and efficiency of movement    Time 4   Period Weeks   Status New     PT SHORT TERM GOAL #4   Title Patient to be able to verbalize and demonstrate methods to "break through" freezing episodes and improve safety and efficiency of gait/general mobility    Time 4   Period Weeks   Status New     PT SHORT TERM GOAL #5   Title Patient to be compliant with appropriate HEP, to be updated PRN    Time 1   Period Weeks   Status New           PT Long Term Goals - 09/08/16 1806      PT LONG TERM GOAL #1   Title Patient to demonstrate MMT as being 5/5 in all tested groups in order to improve balance and general mobility    Time 8   Period Weeks   Status New     PT LONG TERM GOAL #2   Title Patient to demonstrate at least 30-40% reduction in bradykinesia in order to improve general movement pattern and QOL    Time 8   Period Weeks   Status New     PT LONG TERM GOAL #3   Title Patient to score at least 18 on DGI in order to show improved dynamic balance and reduced fall risk    Time 8   Period Weeks   Status New     PT LONG TERM GOAL #4   Title Patient to be able to ambulate at least 671ft druing 3MWT in order to show improved mobility and community access    Time 8   Period Weeks   Status New     PT LONG TERM GOAL #5   Title Patient to be able to perform retrogait pattern with equal step lengths and no LOB in order to show improved mobiilty and balance as well as to assist in improving normal gait pattern    Time 8   Period Weeks   Status New               Plan - 10/05/16 1624    Clinical Impression Statement Began session on Nustep with focus on high intensity training rather than resistance; patient however requires Mod-Max assist for full range peddling and to maintain close to appropriate speed on machine. Worked on gait training with [place] during mobility throughout session, with Mod cues provided and patient appearing to  have quite a bit of difficulty with consistency with this. Otherwise performed co-treat with OT today with focus on functional reaches/full body activities in as much range of motion as possible. Patient limited quite a bit by bradykinesia and freezing episodes this session, and overall appears to be experiencing more and worsening limitation from Parkinsonian symptoms. At this point his family is working on setting up residence in an assisted living facility; TBD if patient will continue to attend PT here or receive it at the facility itself, clinical staff will plan to discuss this more with family as appropriate moving forward.    Rehab Potential Good   Clinical Impairments Affecting Rehab Potential (+) motivated to participate in skilled PT services, disease process very addressable by PT   PT Frequency 2x / week   PT Duration 8 weeks   PT Treatment/Interventions ADLs/Self Care Home Management;Electrical Stimulation;Moist Heat;Ultrasound;Patient/family education;Therapeutic exercise;Therapeutic activities;Manual techniques;Passive range of motion;Vasopneumatic Device;Dry needling   PT Next Visit Plan re-assess, follow up with assisted living situation; trial walker    PT Home Exercise Plan starfish stretch    Consulted and Agree with Plan of Care Patient   Family Member Consulted Sister      Patient will benefit from skilled therapeutic intervention in order to improve the following deficits and impairments:  Abnormal gait, Improper body mechanics, Decreased coordination, Decreased mobility, Postural dysfunction, Decreased range of motion, Decreased strength, Hypomobility, Decreased balance, Difficulty walking, Decreased safety awareness  Visit Diagnosis: Unsteadiness on feet  Muscle weakness (generalized)  Other abnormalities of gait and mobility     Problem List Patient Active Problem List   Diagnosis Date Noted  . Parkinson disease ([place]) 09/05/2015  . Vitamin D deficiency  09/05/2015  . Metabolic syndrome 70/62/3762  . Hypertension 04/02/2013  . Hyperlipidemia 04/02/2013  . BPH (benign prostatic hyperplasia) 04/02/2013  . Left arm weakness 11/21/2012  . Parkinsonism ([place]) 11/21/2012  . Neck pain 11/21/2012  . Colon adenomas 06/29/2012  . Rectal mass 06/29/2012  . Schizophrenia ([place]) 06/22/2012  . Dysphagia 06/22/2011  . Eosinophilic esophagitis 83/15/1761    Deniece Ree PT, DPT [place] [address] [place], [place], [address] Phone: [phone]   Fax:  [phone]  Name: PAYTEN BEAUMIER MRN: [id number] Date of Birth: 1960/12/21

## 2016-10-05 NOTE — Telephone Encounter (Signed)
Caregiver states that he may be going to assistance living by the end of the week. They will see the MD tomorrow. They will let us know if they will keep the future appointments as soon as they know something,. NF 10/05/16

## 2016-10-05 NOTE — Telephone Encounter (Signed)
Letter picked up by sister

## 2016-10-06 ENCOUNTER — Telehealth (HOSPITAL_COMMUNITY): Payer: Self-pay | Admitting: Physical Therapy

## 2016-10-06 ENCOUNTER — Ambulatory Visit (INDEPENDENT_AMBULATORY_CARE_PROVIDER_SITE_OTHER): Payer: PPO | Admitting: Family Medicine

## 2016-10-06 ENCOUNTER — Ambulatory Visit: Payer: PPO | Admitting: Family Medicine

## 2016-10-06 ENCOUNTER — Encounter: Payer: Self-pay | Admitting: Family Medicine

## 2016-10-06 VITALS — BP 102/59 | HR 75 | Temp 97.7°F | Ht 72.0 in | Wt 171.0 lb

## 2016-10-06 DIAGNOSIS — M542 Cervicalgia: Secondary | ICD-10-CM

## 2016-10-06 DIAGNOSIS — R2681 Unsteadiness on feet: Secondary | ICD-10-CM | POA: Diagnosis not present

## 2016-10-06 DIAGNOSIS — G629 Polyneuropathy, unspecified: Secondary | ICD-10-CM

## 2016-10-06 DIAGNOSIS — G20 Parkinson's disease: Secondary | ICD-10-CM | POA: Diagnosis not present

## 2016-10-06 DIAGNOSIS — N400 Enlarged prostate without lower urinary tract symptoms: Secondary | ICD-10-CM

## 2016-10-06 DIAGNOSIS — E7800 Pure hypercholesterolemia, unspecified: Secondary | ICD-10-CM

## 2016-10-06 DIAGNOSIS — R29898 Other symptoms and signs involving the musculoskeletal system: Secondary | ICD-10-CM

## 2016-10-06 DIAGNOSIS — R531 Weakness: Secondary | ICD-10-CM | POA: Diagnosis not present

## 2016-10-06 DIAGNOSIS — I10 Essential (primary) hypertension: Secondary | ICD-10-CM | POA: Diagnosis not present

## 2016-10-06 DIAGNOSIS — M4802 Spinal stenosis, cervical region: Secondary | ICD-10-CM

## 2016-10-06 DIAGNOSIS — Z111 Encounter for screening for respiratory tuberculosis: Secondary | ICD-10-CM | POA: Diagnosis not present

## 2016-10-06 DIAGNOSIS — F209 Schizophrenia, unspecified: Secondary | ICD-10-CM | POA: Diagnosis not present

## 2016-10-06 DIAGNOSIS — E559 Vitamin D deficiency, unspecified: Secondary | ICD-10-CM

## 2016-10-06 NOTE — Progress Notes (Addendum)
Subjective:    Patient ID: Mark Benjamin, male    DOB: Aug 08, 1960, 56 y.o.   MRN: [id number]  HPI Pt here for follow up and management of chronic medical problems which includes hyperlipidemia and hypertension. He is taking medication medication regularly.The patient comes to the visit with a history of multiple medical issues including Parkinson's disease schizophrenia hyperlipidemia vitamin D deficiency and cervicalgia. This recent note was reviewed and also some of his previous MRI reports of the cervical spine were reviewed indicating cervical spine spondylosis and disc bulging and spinal stenosis. He is had progressive left arm weakness slowness and stiffness. Dr. Mamie Nick thinks that his Parkinson's disease has progressed plus he is concerned about the mild cervical spinal stenosis and cervical radiculopathy. He also felt concerned regarding the patient living by himself and that he would like to try some additional medicine for his Parkinson's disease but he will have to work closely with the patient's psychiatrist to make sure that any medicines for Parkinson's disease do not exacerbate his schizophrenia. The patient denies any chest pain or shortness of breath more than usual. He has had some constipation and took a stool softener and some prune juice and had loose stools after that but his stools are back to normal now. He denies any blood in the stool trouble swallowing heartburn indigestion nausea or vomiting. He says he's passing his water without problems. He says his Parkinson's problems are getting worse. He seems more stiff and more expressionless today. He does respond appropriately to questions asked of him. He does complain of some right lower arm discomfort which sounds like a tendinitis but not in the medial or lateral epicondyles area.    Patient Active Problem List   Diagnosis Date Noted  . Parkinson disease ([place]) 09/05/2015  . Vitamin D deficiency 09/05/2015  . Metabolic syndrome  40/98/1191  . Hypertension 04/02/2013  . Hyperlipidemia 04/02/2013  . BPH (benign prostatic hyperplasia) 04/02/2013  . Left arm weakness 11/21/2012  . Parkinsonism ([place]) 11/21/2012  . Neck pain 11/21/2012  . Colon adenomas 06/29/2012  . Rectal mass 06/29/2012  . Schizophrenia ([place]) 06/22/2012  . Dysphagia 06/22/2011  . Eosinophilic esophagitis 47/82/9562   Outpatient Encounter Prescriptions as of 10/06/2016  Medication Sig  . B Complex Vitamins (VITAMIN-B COMPLEX PO) Take 1 tablet by mouth daily.  . benztropine (COGENTIN) 1 MG tablet Take 1 tablet by mouth 2 (two) times daily.  . Cholecalciferol (VITAMIN D3) 1000 UNITS CAPS Take 3 tablets by mouth daily.   . divalproex (DEPAKOTE) 250 MG DR tablet Take 250 mg by mouth 2 (two) times daily. Take 263m BID  . docusate sodium (COLACE) 100 MG capsule Take 100 mg by mouth 2 (two) times daily.  . fosinopril-hydrochlorothiazide (MONOPRIL-HCT) 10-12.5 MG tablet Take 1 Tablet by mouth once daily  . Multiple Vitamin (MULTIVITAMIN) tablet Take 1 tablet by mouth daily. For 50 Plus  . naproxen (NAPROSYN) 500 MG tablet Take 1 Tablet by mouth 2 times a day with meals  . risperiDONE (RISPERDAL) 1 MG tablet Take by mouth. Takes 1 mg po AM and 2 mg po PM.  . rosuvastatin (CRESTOR) 20 MG tablet Take 1 Tablet by mouth once daily  . [DISCONTINUED] diltiazem 2 % GEL Apply 1 application topically 2 (two) times daily. (Patient taking differently: Apply 1 application topically as needed. )   No facility-administered encounter medications on file as of 10/06/2016.      Review of Systems  Constitutional: Positive for fatigue.  HENT: Negative.  Eyes: Negative.   Respiratory: Negative.   Cardiovascular: Negative.   Gastrointestinal: Negative.   Endocrine: Negative.   Genitourinary: Negative.   Musculoskeletal: Positive for arthralgias (right lower arm discomfort).  Skin: Negative.   Allergic/Immunologic: Negative.   Neurological: Positive for weakness.    Hematological: Negative.   Psychiatric/Behavioral: Negative.        Objective:   Physical Exam  Constitutional: He is oriented to person, place, and time. He appears well-developed and well-nourished. No distress.  The patient seems to be progressing with his Parkinson disease and he says he is getting worse and is more stiff and cannot do things at home that he used to could do including putting his shirt on.  HENT:  Head: Normocephalic and atraumatic.  Nose: Nose normal.  Mouth/Throat: Oropharynx is clear and moist. No oropharyngeal exudate.  Bilateral ear cerumen  Eyes: Pupils are equal, round, and reactive to light. Conjunctivae and EOM are normal. Right eye exhibits no discharge. Left eye exhibits no discharge. No scleral icterus.  Neck: Normal range of motion. Neck supple. No thyromegaly present.  No bruits thyromegaly or anterior cervical adenopathy  Cardiovascular: Normal rate, regular rhythm, normal heart sounds and intact distal pulses.   No murmur heard. Heart was regular today at 72/m  Pulmonary/Chest: Effort normal and breath sounds normal. No respiratory distress. He has no wheezes. He has no rales. He exhibits no tenderness.  Clear anteriorly and posteriorly and no axillary adenopathy  Abdominal: Soft. Bowel sounds are normal. He exhibits no mass. There is no tenderness. There is no rebound and no guarding.  No liver or spleen enlargement no masses and no abdominal tenderness.  Musculoskeletal: He exhibits deformity (some kyphosis). He exhibits no edema or tenderness.  Lymphadenopathy:    He has no cervical adenopathy.  Neurological: He is alert and oriented to person, place, and time. He has normal reflexes. No cranial nerve deficit.  The patient has rigidity bilaterally and especially with the left upper extremity. He is slow in his movements and has mask facies.  Skin: Skin is warm and dry. No rash noted.  Psychiatric: He has a normal mood and affect. His behavior is  normal. Judgment and thought content normal.  Nursing note and vitals reviewed.  BP (!) 102/59 (BP Location: Left Arm)   Pulse 75   Temp 97.7 F (36.5 C) (Oral)   Ht 6' (1.829 m)   Wt 171 lb (77.6 kg)   BMI 23.19 kg/m    Ear irrigation to remove cerumen The patient and myself had a conversation about him needing more care that he could not take care of some of the activities of daily living that he had been taking care of at home and he is in favor of going somewhere to get more help. He is scheduled understand to go to the assisted living facility this Friday. Hopefully medications can be started to help with his stiffness and allow him to have more mobility once he can have better supervision.     Assessment & Plan:  1. Essential hypertension -The blood pressures at the low end of the normal range and we will not make any changes with this as of today. - CBC with Differential/Platelet - BMP8+EGFR - Hepatic function panel  2. Vitamin D deficiency -Continue with vitamin D replacement pending results of lab work - CBC with Differential/Platelet - VITAMIN D 25 Hydroxy (Vit-D Deficiency, Fractures)  3. Pure hypercholesterolemia -Continue with as aggressive therapeutic lifestyle changes as possible and consider  reducing Crestor because of the patient's weight loss if the numbers are good this time. - CBC with Differential/Platelet - Lipid panel  4. Parkinson disease ([place]) -Continue to follow-up with neurology - CBC with Differential/Platelet -We will arrange for physical therapy at assisted living  5. Schizophrenia, unspecified type ([place]) -Continue to follow-up with psychiatry - CBC with Differential/Platelet  6. Benign prostatic hyperplasia, unspecified whether lower urinary tract symptoms present -No complaints with this today. - CBC with Differential/Platelet  7. Weakness -This weakness is most likely associated with his Parkinson's symptoms. - CBC with  Differential/Platelet  8. 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  9. Cervical spinal stenosis -This is still being followed by the neurologist currently.  Patient Instructions                       Medicare Annual Wellness Visit  [facility] and the medical providers at [facility] strive to bring you the best medical care.  In doing so we not only want to address your current medical conditions and concerns but also to detect new conditions early and prevent illness, disease and health-related problems.    Medicare offers a yearly Wellness Visit which allows our clinical staff to assess your need for preventative services including immunizations, lifestyle education, counseling to decrease risk of preventable diseases and screening for fall risk and other medical concerns.    This visit is provided free of charge (no copay) for all Medicare recipients. The clinical pharmacists at [facility] have begun to conduct these Wellness Visits which will also include a thorough review of all your medications.    As you primary medical provider recommend that you make an appointment for your Annual Wellness Visit if you have not done so already this year.  You may set up this appointment before you leave today or you may call back ([phone]) and schedule an appointment.  Please make sure when you call that you mention that you are scheduling your Annual Wellness Visit with the clinical pharmacist so that the appointment may be made for the proper length of time.     Continue current medications. Continue good therapeutic lifestyle changes which include good diet and exercise. Fall precautions discussed with patient. If an FOBT was given today- please return it to our front desk. If you are over 19 years old - you may need Prevnar 47 or the adult Pneumonia vaccine.  **Flu shots are  available--- please call and schedule a FLU-CLINIC appointment**  After your visit with [place] today you will receive a survey in the mail or online from [employer] regarding your care with [place]. Please take a moment to fill this out. Your feedback is very important to [place] as you can help [place] better understand your patient needs as well as improve your experience and satisfaction. WE CARE ABOUT YOU!!!   Follow-up with neurologist as planned Follow through with moving to a facility that can provide more assistance to the patient's daily needs  Arrie Senate MD

## 2016-10-06 NOTE — Telephone Encounter (Signed)
Will be moving into [facility] in [place], they will bring him to these apptments according to his Caregiver (sisiter)

## 2016-10-06 NOTE — Addendum Note (Signed)
Addended by: Zannie Cove on: 10/06/2016 12:35 PM   Modules accepted: Orders

## 2016-10-06 NOTE — Patient Instructions (Addendum)
Medicare Annual Wellness Visit  [facility] and the medical providers at [facility] strive to bring you the best medical care.  In doing so we not only want to address your current medical conditions and concerns but also to detect new conditions early and prevent illness, disease and health-related problems.    Medicare offers a yearly Wellness Visit which allows our clinical staff to assess your need for preventative services including immunizations, lifestyle education, counseling to decrease risk of preventable diseases and screening for fall risk and other medical concerns.    This visit is provided free of charge (no copay) for all Medicare recipients. The clinical pharmacists at [facility] have begun to conduct these Wellness Visits which will also include a thorough review of all your medications.    As you primary medical provider recommend that you make an appointment for your Annual Wellness Visit if you have not done so already this year.  You may set up this appointment before you leave today or you may call back ([phone]) and schedule an appointment.  Please make sure when you call that you mention that you are scheduling your Annual Wellness Visit with the clinical pharmacist so that the appointment may be made for the proper length of time.     Continue current medications. Continue good therapeutic lifestyle changes which include good diet and exercise. Fall precautions discussed with patient. If an FOBT was given today- please return it to our front desk. If you are over 8 years old - you may need Prevnar 22 or the adult Pneumonia vaccine.  **Flu shots are available--- please call and schedule a FLU-CLINIC appointment**  After your visit with [place] today you will receive a survey in the mail or online from [employer] regarding your care with [place]. Please take a moment to fill this out. Your feedback is very  important to [place] as you can help [place] better understand your patient needs as well as improve your experience and satisfaction. WE CARE ABOUT YOU!!!   Follow-up with neurologist as planned Follow through with moving to a facility that can provide more assistance to the patient's daily needs

## 2016-10-07 ENCOUNTER — Ambulatory Visit (HOSPITAL_COMMUNITY): Payer: PPO | Admitting: Occupational Therapy

## 2016-10-07 ENCOUNTER — Ambulatory Visit (HOSPITAL_COMMUNITY): Payer: PPO | Admitting: Physical Therapy

## 2016-10-07 LAB — CBC WITH DIFFERENTIAL/PLATELET
BASOS: 1 %
Basophils Absolute: 0.1 10*3/uL (ref 0.0–0.2)
EOS (ABSOLUTE): 0.2 10*3/uL (ref 0.0–0.4)
EOS: 3 %
HEMOGLOBIN: 13.3 g/dL (ref 13.0–17.7)
Hematocrit: 39.8 % (ref 37.5–51.0)
IMMATURE GRANS (ABS): 0 10*3/uL (ref 0.0–0.1)
IMMATURE GRANULOCYTES: 0 %
LYMPHS: 25 %
Lymphocytes Absolute: 1.4 10*3/uL (ref 0.7–3.1)
MCH: 30.6 pg (ref 26.6–33.0)
MCHC: 33.4 g/dL (ref 31.5–35.7)
MCV: 92 fL (ref 79–97)
Monocytes Absolute: 0.6 10*3/uL (ref 0.1–0.9)
Monocytes: 12 %
NEUTROS PCT: 59 %
Neutrophils Absolute: 3.3 10*3/uL (ref 1.4–7.0)
Platelets: 207 10*3/uL (ref 150–379)
RBC: 4.35 x10E6/uL (ref 4.14–5.80)
RDW: 13.1 % (ref 12.3–15.4)
WBC: 5.5 10*3/uL (ref 3.4–10.8)

## 2016-10-07 LAB — LIPID PANEL
CHOL/HDL RATIO: 1.9 ratio (ref 0.0–5.0)
Cholesterol, Total: 98 mg/dL — ABNORMAL LOW (ref 100–199)
HDL: 51 mg/dL (ref >39)
LDL Calculated: 37 mg/dL (ref 0–99)
TRIGLYCERIDES: 50 mg/dL (ref 0–149)
VLDL Cholesterol Cal: 10 mg/dL (ref 5–40)

## 2016-10-07 LAB — HEPATIC FUNCTION PANEL
ALBUMIN: 4.6 g/dL (ref 3.5–5.5)
ALT: 19 IU/L (ref 0–44)
AST: 25 IU/L (ref 0–40)
Alkaline Phosphatase: 52 IU/L (ref 39–117)
BILIRUBIN TOTAL: 1.4 mg/dL — AB (ref 0.0–1.2)
BILIRUBIN, DIRECT: 0.29 mg/dL (ref 0.00–0.40)
Total Protein: 6.7 g/dL (ref 6.0–8.5)

## 2016-10-07 LAB — BMP8+EGFR
BUN/Creatinine Ratio: 13 (ref 9–20)
BUN: 11 mg/dL (ref 6–24)
CALCIUM: 9.8 mg/dL (ref 8.7–10.2)
CO2: 26 mmol/L (ref 20–29)
Chloride: 99 mmol/L (ref 96–106)
Creatinine, Ser: 0.84 mg/dL (ref 0.76–1.27)
GFR, EST AFRICAN AMERICAN: 113 mL/min/{1.73_m2} (ref >59)
GFR, EST NON AFRICAN AMERICAN: 98 mL/min/{1.73_m2} (ref >59)
Glucose: 89 mg/dL (ref 65–99)
POTASSIUM: 4.2 mmol/L (ref 3.5–5.2)
SODIUM: 141 mmol/L (ref 134–144)

## 2016-10-07 LAB — VITAMIN D 25 HYDROXY (VIT D DEFICIENCY, FRACTURES): VIT D 25 HYDROXY: 51.7 ng/mL (ref 30.0–100.0)

## 2016-10-08 ENCOUNTER — Telehealth: Payer: Self-pay | Admitting: Family Medicine

## 2016-10-08 LAB — TB SKIN TEST
INDURATION: 0 mm
TB Skin Test: NEGATIVE

## 2016-10-11 ENCOUNTER — Telehealth (HOSPITAL_COMMUNITY): Payer: Self-pay | Admitting: Family Medicine

## 2016-10-11 NOTE — Telephone Encounter (Signed)
10/11/16  sister called and said that Dr. Laurance Flatten wrote an order for home health and he will be getting home health from now on

## 2016-10-11 NOTE — Telephone Encounter (Signed)
This suggestion can be done - once DR Laurance Flatten is in the office on Wednesday. I attempted to call his sister - with NA , No VM

## 2016-10-11 NOTE — Telephone Encounter (Signed)
10/11/16 sister called to cancel this week's appt.  She said that he was moving into assisted living earlier than planned

## 2016-10-11 NOTE — Telephone Encounter (Signed)
Mark Benjamin aware that we will get PT ordered and start the DNR form

## 2016-10-12 ENCOUNTER — Ambulatory Visit (HOSPITAL_COMMUNITY): Payer: PPO | Admitting: Physical Therapy

## 2016-10-13 ENCOUNTER — Telehealth: Payer: Self-pay | Admitting: Diagnostic Neuroimaging

## 2016-10-13 NOTE — Telephone Encounter (Signed)
Spoke with [name] - all is taken care of except the PT at [facility]

## 2016-10-13 NOTE — Telephone Encounter (Signed)
Pt sister wanted it added that pt is on a low fixed income and re: any medication called in for the Parkinson's, she is asking it be generic to accommodate his income

## 2016-10-13 NOTE — Telephone Encounter (Signed)
Give patient time to adjust to assisted living. May consider starting after 1 month. -VRP

## 2016-10-13 NOTE — Telephone Encounter (Signed)
Pt sister (on [place]) calling to inform that pt was moved into assited living as of yesterday 10-12-2016 and she would like to know when Dr Leta Baptist would like to start pt on the Parkinson's medication.  Please call

## 2016-10-13 NOTE — Telephone Encounter (Signed)
Spoke to [place], sister relayed Dr. Gladstone Lighter note to wait a month to adjust to new living enviroment and then to consider generic form of medication.  She will call back in a month.  He is staying at [place] in [place] [employer]).  [phone].

## 2016-10-14 ENCOUNTER — Ambulatory Visit (HOSPITAL_COMMUNITY): Payer: PPO | Admitting: Occupational Therapy

## 2016-10-14 ENCOUNTER — Ambulatory Visit (HOSPITAL_COMMUNITY): Payer: PPO | Admitting: Physical Therapy

## 2016-10-14 NOTE — Addendum Note (Signed)
Addended by: Zannie Cove on: 10/14/2016 12:08 PM   Modules accepted: Orders

## 2016-10-18 ENCOUNTER — Other Ambulatory Visit: Payer: Self-pay | Admitting: *Deleted

## 2016-10-18 DIAGNOSIS — Z593 Problems related to living in residential institution: Secondary | ICD-10-CM

## 2016-10-18 NOTE — Progress Notes (Signed)
DNR scanned into chart. Patient is living at [facility].

## 2016-10-19 ENCOUNTER — Ambulatory Visit (HOSPITAL_COMMUNITY): Payer: PPO | Admitting: Physical Therapy

## 2016-10-21 ENCOUNTER — Ambulatory Visit (HOSPITAL_COMMUNITY): Payer: PPO | Admitting: Physical Therapy

## 2016-10-21 ENCOUNTER — Encounter (HOSPITAL_COMMUNITY): Payer: PPO

## 2016-10-25 DIAGNOSIS — M6281 Muscle weakness (generalized): Secondary | ICD-10-CM | POA: Diagnosis not present

## 2016-10-25 DIAGNOSIS — F209 Schizophrenia, unspecified: Secondary | ICD-10-CM | POA: Diagnosis not present

## 2016-10-25 DIAGNOSIS — I10 Essential (primary) hypertension: Secondary | ICD-10-CM | POA: Diagnosis not present

## 2016-10-25 DIAGNOSIS — G20 Parkinson's disease: Secondary | ICD-10-CM | POA: Diagnosis not present

## 2016-10-25 DIAGNOSIS — R633 Feeding difficulties: Secondary | ICD-10-CM | POA: Diagnosis not present

## 2016-10-25 DIAGNOSIS — G629 Polyneuropathy, unspecified: Secondary | ICD-10-CM | POA: Diagnosis not present

## 2016-10-25 DIAGNOSIS — M4802 Spinal stenosis, cervical region: Secondary | ICD-10-CM | POA: Diagnosis not present

## 2016-10-25 DIAGNOSIS — R2689 Other abnormalities of gait and mobility: Secondary | ICD-10-CM | POA: Diagnosis not present

## 2016-10-26 ENCOUNTER — Encounter (HOSPITAL_COMMUNITY): Payer: PPO

## 2016-10-26 ENCOUNTER — Ambulatory Visit (HOSPITAL_COMMUNITY): Payer: PPO | Admitting: Physical Therapy

## 2016-10-28 ENCOUNTER — Ambulatory Visit (HOSPITAL_COMMUNITY): Payer: PPO | Admitting: Physical Therapy

## 2016-10-28 ENCOUNTER — Encounter (HOSPITAL_COMMUNITY): Payer: PPO | Admitting: Occupational Therapy

## 2016-11-02 ENCOUNTER — Ambulatory Visit (HOSPITAL_COMMUNITY): Payer: PPO

## 2016-11-02 ENCOUNTER — Encounter (HOSPITAL_COMMUNITY): Payer: PPO | Admitting: Occupational Therapy

## 2016-11-08 ENCOUNTER — Ambulatory Visit (HOSPITAL_COMMUNITY): Payer: PPO | Admitting: Speech Pathology

## 2016-11-08 ENCOUNTER — Encounter (HOSPITAL_COMMUNITY): Payer: Self-pay

## 2016-11-15 ENCOUNTER — Encounter: Payer: Self-pay | Admitting: *Deleted

## 2016-11-15 NOTE — Telephone Encounter (Signed)
My plan would be to start carb/levo 25/100 (half tab TID) with meals x 1-2 weeks; then increase to 1 tab TID. I would want his PCP and psychiatrist to weigh in on this before we start this. -VRP

## 2016-11-15 NOTE — Telephone Encounter (Signed)
Patient's sister [name] calling to discuss possible generic Parkinson's medication since patient has been in assisted living for over a month. [name] can be reached at [phone],.

## 2016-11-16 ENCOUNTER — Telehealth: Payer: Self-pay | Admitting: *Deleted

## 2016-11-16 NOTE — Telephone Encounter (Signed)
Spoke with Roselyn Reef RN for Dr Redge Gainer, patient's PCP. Informed her that Dr Leta Baptist plans to start carbidopa-levodopa for patient and wanted to advise Dr Laurance Flatten. [facility] specifics. Roselyn Reef stated that the last time patient saw Dr Laurance Flatten he had advised him that he would most likely agree with Dr Gladstone Lighter recommendations for prescriptions to treat patient tremors.  This RN advised Roselyn Reef to inform Dr Laurance Flatten; Roselyn Reef stated she would call back only if any questions or concerns from Dr Laurance Flatten. She verbalized understanding.   [place], [phone] to speak with patient's psychiatrist. Unable to reach RN;  LVM on nurses' line requesting a call back today if possible.

## 2016-11-16 NOTE — Telephone Encounter (Signed)
Received call back from Juliette Mangle, [facility]. She stated patient's physician is Dr Hoyle Barr. This RN informed Anderson Malta of Dr Anson Fret specific prescription plan for patient for his tremors. Henrene Dodge that Dr Leta Baptist wants PCP and psychiatrist to weigh in before he prescribes. Anderson Malta wrote down and repeated correctly Rx details. She stated she would call back with Dr Clovis Pu response.

## 2016-11-18 NOTE — Telephone Encounter (Signed)
LVM requesting Anderson Malta RN call back to let this RN know if Dr Hoyle Barr had any instructions regarding patient and new  medication. Advised her the office closes at noon tomorrow; this RN will not be in office tomorrow but other RNs will be available to take messages.

## 2016-11-19 ENCOUNTER — Ambulatory Visit (INDEPENDENT_AMBULATORY_CARE_PROVIDER_SITE_OTHER): Payer: PPO | Admitting: Family Medicine

## 2016-11-19 DIAGNOSIS — R2689 Other abnormalities of gait and mobility: Secondary | ICD-10-CM | POA: Diagnosis not present

## 2016-11-19 DIAGNOSIS — G629 Polyneuropathy, unspecified: Secondary | ICD-10-CM

## 2016-11-19 DIAGNOSIS — M4802 Spinal stenosis, cervical region: Secondary | ICD-10-CM

## 2016-11-19 DIAGNOSIS — I10 Essential (primary) hypertension: Secondary | ICD-10-CM | POA: Diagnosis not present

## 2016-11-19 DIAGNOSIS — G20 Parkinson's disease: Secondary | ICD-10-CM

## 2016-11-19 DIAGNOSIS — M6281 Muscle weakness (generalized): Secondary | ICD-10-CM | POA: Diagnosis not present

## 2016-11-19 DIAGNOSIS — F209 Schizophrenia, unspecified: Secondary | ICD-10-CM | POA: Diagnosis not present

## 2016-11-19 DIAGNOSIS — R633 Feeding difficulties: Secondary | ICD-10-CM

## 2016-11-19 NOTE — Telephone Encounter (Signed)
I I spoke to [place], with Dr. Hoyle Barr.  He stated that carbidopa/ levodopa is antagonist to risperdal, but just to use best judgement and they will continue to monitor him with his psychiatric issues.

## 2016-11-23 MED ORDER — CARBIDOPA-LEVODOPA 25-100 MG PO TABS: ORAL_TABLET | ORAL | 6 refills | Status: DC

## 2016-11-23 NOTE — Addendum Note (Signed)
Addended by: Andrey Spearman R on: 11/23/2016 04:43 PM   Modules accepted: Orders

## 2016-11-23 NOTE — Telephone Encounter (Signed)
Called and got fax # to [facility] [place] in [place].  [phone].  Fax confirmation received.

## 2016-11-23 NOTE — Telephone Encounter (Signed)
Go ahead and start carb/levo (25/100). Start half tab three times per day with meals; after 2 weeks, increase to 1 tab three times per day and continue.  Penni Bombard, MD 10/26/8546, 8:30 PM Certified in Neurology, Neurophysiology and Neuroimaging  [facility] [address] [place], [place] [address] [phone]

## 2016-11-24 DIAGNOSIS — B9689 Other specified bacterial agents as the cause of diseases classified elsewhere: Secondary | ICD-10-CM | POA: Diagnosis not present

## 2016-11-24 DIAGNOSIS — M6281 Muscle weakness (generalized): Secondary | ICD-10-CM | POA: Diagnosis not present

## 2016-11-24 DIAGNOSIS — F209 Schizophrenia, unspecified: Secondary | ICD-10-CM | POA: Diagnosis not present

## 2016-11-24 DIAGNOSIS — G629 Polyneuropathy, unspecified: Secondary | ICD-10-CM | POA: Diagnosis not present

## 2016-11-24 DIAGNOSIS — G20 Parkinson's disease: Secondary | ICD-10-CM | POA: Diagnosis not present

## 2016-11-24 DIAGNOSIS — R633 Feeding difficulties: Secondary | ICD-10-CM | POA: Diagnosis not present

## 2016-11-24 DIAGNOSIS — N390 Urinary tract infection, site not specified: Secondary | ICD-10-CM | POA: Diagnosis not present

## 2016-11-24 DIAGNOSIS — M4802 Spinal stenosis, cervical region: Secondary | ICD-10-CM | POA: Diagnosis not present

## 2016-11-24 DIAGNOSIS — R2689 Other abnormalities of gait and mobility: Secondary | ICD-10-CM | POA: Diagnosis not present

## 2016-11-24 DIAGNOSIS — I10 Essential (primary) hypertension: Secondary | ICD-10-CM | POA: Diagnosis not present

## 2016-11-24 NOTE — Telephone Encounter (Signed)
Spoke to [employer] and let her know that prescription for sinemet was faxed to [place] [place] in [place] for pt to start.  I relayed that did contact his pcp and psychiatry on starting this medication.  Let her know that 25/100 1/2 tablet po tid with meals for 2 wks and then advance to 1 tab po tid.  Attempted to call and speak to RN on unit for pt but will call back tomorrow.

## 2016-11-25 NOTE — Telephone Encounter (Signed)
Spoke to pts nurse today, sidney and let her know that new medication , sinemet was started for pt and just to be aware and notices if any behavior changes to let us know.  She verbalized understanding.

## 2016-12-01 ENCOUNTER — Encounter: Payer: Self-pay | Admitting: Family Medicine

## 2016-12-01 ENCOUNTER — Telehealth: Payer: Self-pay | Admitting: Diagnostic Neuroimaging

## 2016-12-01 ENCOUNTER — Ambulatory Visit (INDEPENDENT_AMBULATORY_CARE_PROVIDER_SITE_OTHER): Payer: PPO | Admitting: Family Medicine

## 2016-12-01 ENCOUNTER — Telehealth: Payer: Self-pay | Admitting: *Deleted

## 2016-12-01 VITALS — BP 89/55 | HR 105 | Temp 97.6°F | Ht 73.0 in | Wt 169.8 lb

## 2016-12-01 DIAGNOSIS — R300 Dysuria: Secondary | ICD-10-CM | POA: Diagnosis not present

## 2016-12-01 DIAGNOSIS — N3000 Acute cystitis without hematuria: Secondary | ICD-10-CM

## 2016-12-01 LAB — URINALYSIS, COMPLETE
Bilirubin, UA: NEGATIVE
Glucose, UA: NEGATIVE
KETONES UA: NEGATIVE
NITRITE UA: NEGATIVE
Protein, UA: NEGATIVE
SPEC GRAV UA: 1.01 (ref 1.005–1.030)
Urobilinogen, Ur: 0.2 mg/dL (ref 0.2–1.0)
pH, UA: 6.5 (ref 5.0–7.5)

## 2016-12-01 LAB — MICROSCOPIC EXAMINATION
EPITHELIAL CELLS (NON RENAL): NONE SEEN /HPF (ref 0–10)
RENAL EPITHEL UA: NONE SEEN /HPF

## 2016-12-01 MED ORDER — DOXYCYCLINE HYCLATE 100 MG PO TABS: ORAL_TABLET | ORAL | 0 refills | Status: DC

## 2016-12-01 NOTE — Telephone Encounter (Signed)
Ok to stop. -VRP

## 2016-12-01 NOTE — Progress Notes (Signed)
BP (!) 89/55   Pulse (!) 105   Temp 97.6 F (36.4 C) (Oral)   Ht 6\' 1"  (1.854 m)   Wt 169 lb 12.8 oz (77 kg)   BMI 22.40 kg/m    Subjective:    Patient ID: Mark Benjamin, male    DOB: 1960-12-02, 56 y.o.   MRN: [id number]  HPI: Mark Benjamin is a 56 y.o. male presenting on 12/01/2016 for Dysuria (patient states that it has been going on for several weeks ); Hypertension (this morning and elevated HR); and Fever (Patient states it was 102 this morning)   HPI Dysuria and fever and frequency Patient has been having dysuria and frequency that has been going on for the past day and a half. Patient has burning and frequency and a fever 102 this morning and then has been more shaky and has had decreased appetite. He is brought here with his sister as he is a patient from [place]. She says they gave him Tylenol and his fever is down now but is concerned about these issues. She said they also recently started carbidopa/levodopa and was concerned about whether or not the medication could be causing this. He says he is having sharp pains in his penis when he tries to urinate.  Relevant past medical, surgical, family and social history reviewed and updated as indicated. Interim medical history since our last visit reviewed. Allergies and medications reviewed and updated.  Review of Systems  Constitutional: Negative for chills and fever.  Respiratory: Negative for shortness of breath and wheezing.   Cardiovascular: Negative for chest pain and leg swelling.  Gastrointestinal: Negative for abdominal pain, diarrhea, nausea and vomiting.  Genitourinary: Positive for dysuria, frequency and urgency. Negative for decreased urine volume and hematuria.  Musculoskeletal: Negative for back pain and gait problem.  Skin: Negative for rash.  All other systems reviewed and are negative.   Per HPI unless specifically indicated above     Objective:    BP (!) 89/55   Pulse (!) 105   Temp 97.6 F (36.4  C) (Oral)   Ht 6\' 1"  (1.854 m)   Wt 169 lb 12.8 oz (77 kg)   BMI 22.40 kg/m   Wt Readings from Last 3 Encounters:  12/01/16 169 lb 12.8 oz (77 kg)  10/06/16 171 lb (77.6 kg)  09/29/16 173 lb (78.5 kg)    Physical Exam  Constitutional: He is oriented to person, place, and time. He appears well-developed and well-nourished. No distress.  Eyes: Conjunctivae are normal. No scleral icterus.  Cardiovascular: Normal rate, regular rhythm, normal heart sounds and intact distal pulses.   No murmur heard. Pulmonary/Chest: Effort normal. No respiratory distress. He has no wheezes. He has no rales.  Abdominal: Soft. Bowel sounds are normal. He exhibits no distension. There is no tenderness. There is no rebound, no guarding and no CVA tenderness.  Musculoskeletal: Normal range of motion. He exhibits no edema.  Neurological: He is alert and oriented to person, place, and time. Coordination normal.  Skin: Skin is warm and dry. No rash noted. He is not diaphoretic.  Psychiatric: He has a normal mood and affect. His behavior is normal.  Nursing note and vitals reviewed.   Urinalysis: 6-10 WBCs, 3-10 RBCs, many bacteria, otherwise negative    Assessment & Plan:   Problem List Items Addressed This Visit    None    Visit Diagnoses    Acute cystitis without hematuria    -  Primary  Relevant Medications   doxycycline (VIBRA-TABS) 100 MG tablet   Other Relevant Orders   Urinalysis, Complete       Follow up plan: Return if symptoms worsen or fail to improve.  Counseling provided for all of the vaccine components Orders Placed This Encounter  Procedures  . Urinalysis, Complete    Caryl Pina, MD [facility] 12/01/2016, 3:25 PM

## 2016-12-01 NOTE — Telephone Encounter (Signed)
Cindy from [facility] is calling [id number] (SINEMET IR) 25-100 MG tablet she states that pt has had an elevated heart rate and higher blood pressure, jaw shaking, these are observations by the physical therapist.  Pt is scheduled to see Dr Redge Gainer today but they would like to know if an order can be sent to stop the medication for now. Please call

## 2016-12-01 NOTE — Telephone Encounter (Signed)
[facility] nurse aware that needs to be seen -- he was made appt for this afternoon with Dr Warrick Parisian

## 2016-12-01 NOTE — Telephone Encounter (Signed)
VO given to Mark Benjamin at NP [place], to stop sinemet.  Pt now has UTI and treated with ABX.  This possibly cause?.  May try to restart again.  Had been on sinemet 25/100 1/2 tab po tid since the 11-24-16.

## 2016-12-02 NOTE — Telephone Encounter (Signed)
Placed an written order on desk for signature, ? Retry after abx for uti?

## 2016-12-02 NOTE — Telephone Encounter (Signed)
May retry in 1-2 weeks. -VRP

## 2016-12-07 NOTE — Telephone Encounter (Addendum)
I spoke to [place] at NP [facility].  Pt has been off sinemet since 12-01-16.  He is doing well at this time per cindy.  He is on ABX for 2 wks. , then can restart.  Jenny Reichmann is to fax an order sheet to me at [phone].  I spoke to sister of pt [name],  and relayed the plan.  She states that pt had some improvement (drooling, some movement out of car, but no improvement in the L arm and hand tremor).   She would like to have medication restarted if ok with Dr, Leta Baptist, (at 1/2 tab po tid for 1 wk then advance to 1 tab po tid.  Monitor pt for SE.

## 2016-12-07 NOTE — Telephone Encounter (Signed)
Pt sister(on DPR ) is asking for a call from [place] re: the Sinemet

## 2016-12-08 NOTE — Telephone Encounter (Signed)
Since they took VO and the sinemet was stopped (Dr. Leta Baptist said to not fax the written order as to not confuse the issue as pt to restart the sinemet again.  Faxed to [facility] the order for restart. Fax confirmation received.  [phone].  Attention cindy.

## 2016-12-13 ENCOUNTER — Telehealth: Payer: Self-pay | Admitting: *Deleted

## 2016-12-13 DIAGNOSIS — G20 Parkinson's disease: Secondary | ICD-10-CM

## 2016-12-13 DIAGNOSIS — R531 Weakness: Secondary | ICD-10-CM

## 2016-12-13 DIAGNOSIS — R29898 Other symptoms and signs involving the musculoskeletal system: Secondary | ICD-10-CM

## 2016-12-13 NOTE — Telephone Encounter (Signed)
Will discuss with cathy and Dr Laurance Flatten on Tuesday.

## 2016-12-13 NOTE — Telephone Encounter (Signed)
Mark Benjamin would like to discuss with [name] going to physical therapy at [facility] here in [name].  She requests a call back from [place].

## 2016-12-17 ENCOUNTER — Telehealth: Payer: Self-pay | Admitting: *Deleted

## 2016-12-17 ENCOUNTER — Telehealth: Payer: Self-pay

## 2016-12-17 DIAGNOSIS — R2681 Unsteadiness on feet: Secondary | ICD-10-CM

## 2016-12-17 DIAGNOSIS — R269 Unspecified abnormalities of gait and mobility: Secondary | ICD-10-CM

## 2016-12-17 DIAGNOSIS — M6281 Muscle weakness (generalized): Secondary | ICD-10-CM

## 2016-12-17 NOTE — Telephone Encounter (Signed)
Pt has been getting PT inpatient at [facility] but Ins will not cover any more Do you think it would be beneficial for him to continue PT as outpatient in [place]? Do you think it is making a difference? Please advise

## 2016-12-17 NOTE — Telephone Encounter (Signed)
Go ahead and send a prescription for this, I have no idea on the dosing because it is just a supplement but whenever it comes in we can prescribe it for him. Give him enough for a year

## 2016-12-17 NOTE — Telephone Encounter (Signed)
Wants to talk to you about occupational health for [facility]

## 2016-12-17 NOTE — Telephone Encounter (Signed)
Would like to get order sent to [facility] for patient to get Cranberry gummies once a day Please advise and fax to [name]

## 2016-12-18 NOTE — Telephone Encounter (Signed)
I would continue the physical therapy at outpatient rehabilitation in [place] if insurance will pay for this. Please schedule this appointment for the patient and let his sister know this.

## 2016-12-20 DIAGNOSIS — M4802 Spinal stenosis, cervical region: Secondary | ICD-10-CM | POA: Diagnosis not present

## 2016-12-20 DIAGNOSIS — M6281 Muscle weakness (generalized): Secondary | ICD-10-CM | POA: Diagnosis not present

## 2016-12-20 DIAGNOSIS — G20 Parkinson's disease: Secondary | ICD-10-CM | POA: Diagnosis not present

## 2016-12-20 DIAGNOSIS — I10 Essential (primary) hypertension: Secondary | ICD-10-CM | POA: Diagnosis not present

## 2016-12-20 DIAGNOSIS — R633 Feeding difficulties: Secondary | ICD-10-CM | POA: Diagnosis not present

## 2016-12-20 DIAGNOSIS — G629 Polyneuropathy, unspecified: Secondary | ICD-10-CM | POA: Diagnosis not present

## 2016-12-20 DIAGNOSIS — R2689 Other abnormalities of gait and mobility: Secondary | ICD-10-CM | POA: Diagnosis not present

## 2016-12-20 DIAGNOSIS — F209 Schizophrenia, unspecified: Secondary | ICD-10-CM | POA: Diagnosis not present

## 2016-12-20 NOTE — Telephone Encounter (Signed)
Spoke with pt's sister regarding PT Order entered in [place] Pt's sister concerned regarding transportation and insurance coverage Sister informed she will have to contact insurance regarding coverage Sister will also have to contact NP regarding transportation Sister verbalizes understanding

## 2016-12-20 NOTE — Addendum Note (Signed)
Addended by: Marin Olp on: 12/20/2016 08:34 AM   Modules accepted: Orders

## 2016-12-21 DIAGNOSIS — G629 Polyneuropathy, unspecified: Secondary | ICD-10-CM | POA: Diagnosis not present

## 2016-12-21 DIAGNOSIS — R2689 Other abnormalities of gait and mobility: Secondary | ICD-10-CM | POA: Diagnosis not present

## 2016-12-21 DIAGNOSIS — R633 Feeding difficulties: Secondary | ICD-10-CM | POA: Diagnosis not present

## 2016-12-21 DIAGNOSIS — M4802 Spinal stenosis, cervical region: Secondary | ICD-10-CM | POA: Diagnosis not present

## 2016-12-21 DIAGNOSIS — I10 Essential (primary) hypertension: Secondary | ICD-10-CM | POA: Diagnosis not present

## 2016-12-21 DIAGNOSIS — F209 Schizophrenia, unspecified: Secondary | ICD-10-CM | POA: Diagnosis not present

## 2016-12-21 DIAGNOSIS — G20 Parkinson's disease: Secondary | ICD-10-CM | POA: Diagnosis not present

## 2016-12-21 DIAGNOSIS — M6281 Muscle weakness (generalized): Secondary | ICD-10-CM | POA: Diagnosis not present

## 2016-12-22 NOTE — Telephone Encounter (Signed)
Referral placed for PT in [place] - with [place]

## 2016-12-24 ENCOUNTER — Telehealth: Payer: Self-pay | Admitting: Family Medicine

## 2016-12-24 NOTE — Telephone Encounter (Signed)
NP calls and states that pt has dysuria and blood in urine -- appt made for [facility] clinic tomorrow

## 2016-12-25 ENCOUNTER — Other Ambulatory Visit: Payer: Self-pay

## 2016-12-25 ENCOUNTER — Ambulatory Visit (INDEPENDENT_AMBULATORY_CARE_PROVIDER_SITE_OTHER): Payer: PPO | Admitting: Family Medicine

## 2016-12-25 ENCOUNTER — Encounter: Payer: Self-pay | Admitting: Family Medicine

## 2016-12-25 VITALS — BP 103/68 | HR 76 | Temp 98.6°F | Ht 73.0 in | Wt 164.0 lb

## 2016-12-25 DIAGNOSIS — N410 Acute prostatitis: Secondary | ICD-10-CM

## 2016-12-25 DIAGNOSIS — R82998 Other abnormal findings in urine: Secondary | ICD-10-CM | POA: Diagnosis not present

## 2016-12-25 DIAGNOSIS — G20 Parkinson's disease: Secondary | ICD-10-CM

## 2016-12-25 DIAGNOSIS — I10 Essential (primary) hypertension: Secondary | ICD-10-CM | POA: Diagnosis not present

## 2016-12-25 MED ORDER — CIPROFLOXACIN HCL 500 MG PO TABS: 500.0000 mg | ORAL_TABLET | Freq: Two times a day (BID) | ORAL | 0 refills | Status: DC

## 2016-12-25 NOTE — Progress Notes (Signed)
Subjective:  Patient ID: Mark Benjamin, male    DOB: 1960/05/12  Age: 56 y.o. MRN: [id number]  CC: discolored urine   HPI Mark Benjamin presents for Urine discolored. This been going on for just a few days. Of note is that he just started carbidopa levodopa which can discolor the urine red or brown. However, the patient states that he is having pressure and that makes him feel the need to go to the bathroom more often. She is having a lot of stinging and burning when he goes. He denies fever chills and sweats. Sister states that he has recently had a infection treated with doxycycline that caused a fever up to 102. Those symptoms resolved he finished the doxycycline but that's just been a short time ago.  Depression screen Mark Benjamin 2/9 12/25/2016 12/01/2016 10/06/2016  Decreased Interest 0 0 2  Down, Depressed, Hopeless 0 0 2  PHQ - 2 Score 0 0 4  Altered sleeping - - 2  Tired, decreased energy - - 3  Change in appetite - - 3  Feeling bad or failure about yourself  - - 2  Trouble concentrating - - 1  Moving slowly or fidgety/restless - - 3  Suicidal thoughts - - 0  PHQ-9 Score - - 18  Difficult doing work/chores - - Somewhat difficult  Some recent data might be hidden    History Mark Benjamin has a past medical history of Cataract; Colon polyps; Dysphagia; Essential hypertension, benign; Fatty liver; Hyperplasia of prostate; 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   He has a past surgical history that includes Upper gastrointestinal endoscopy (EGD ED); Colonoscopy (02/13/08); Colonoscopy (N/A, 07/10/2012); and Urethral dilation (1965).   His family history includes ALS in his father; Heart failure in his mother; Hyperlipidemia in his sister; Inflammatory bowel disease in his paternal grandfather.He reports that he has never smoked. He has never used smokeless tobacco. He reports that he drinks alcohol. He reports that  he does not use drugs.    ROS Review of Systems  Constitutional: Negative for activity change, chills, diaphoresis and fever.  HENT: Negative.   Respiratory: Negative for cough.   Cardiovascular: Negative for chest pain and leg swelling.  Gastrointestinal: Positive for abdominal pain.  Genitourinary: Positive for difficulty urinating, frequency and urgency. Negative for flank pain.  Neurological: Numbness: suprapubic reion.    Objective:  BP 103/68   Pulse 76   Temp 98.6 F (37 C) (Oral)   Ht 6\' 1"  (1.854 m)   Wt 164 lb (74.4 kg)   BMI 21.64 kg/m   BP Readings from Last 3 Encounters:  12/25/16 103/68  12/01/16 (!) 89/55  10/06/16 (!) 102/59    Wt Readings from Last 3 Encounters:  12/25/16 164 lb (74.4 kg)  12/01/16 169 lb 12.8 oz (77 kg)  10/06/16 171 lb (77.6 kg)     Physical Exam  Constitutional: He appears well-developed and well-nourished.  HENT:  Head: Normocephalic and atraumatic.  Right Ear: Tympanic membrane and external ear normal. No decreased hearing is noted.  Left Ear: Tympanic membrane and external ear normal. No decreased hearing is noted.  Mouth/Throat: No oropharyngeal exudate or posterior oropharyngeal erythema.  Eyes: Pupils are equal, round, and reactive to light.  Neck: Normal range of motion. Neck supple.  Cardiovascular: Normal rate and regular rhythm.   No murmur heard. Pulmonary/Chest: Breath sounds normal. No respiratory distress.  Abdominal: Soft. Bowel sounds are normal. He  exhibits no mass. There is no tenderness.  Vitals reviewed.     Assessment & Plan:   Nas was seen today for discolored urine.  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 Sr. and patient reassured.   I am having Mark Benjamin maintain his Vitamin D3,  benztropine, divalproex, docusate sodium, naproxen, rosuvastatin, multivitamin, fosinopril-hydrochlorothiazide, risperiDONE, B Complex Vitamins (VITAMIN-B COMPLEX PO), carbidopa-levodopa, and doxycycline.  Allergies as of 12/25/2016      Reactions   Amoxicillin    Dizzy and blurred vision   Erythromycin    Sulfamethoxazole-trimethoprim Other (See Comments)   Extreme weakness/lethorgy   Zocor [simvastatin]       Medication List       Accurate as of 12/25/16 10:59 AM. Always use your most recent med list.          benztropine 1 MG tablet Commonly known as:  COGENTIN Take 1 tablet by mouth 2 (two) times daily.   carbidopa-levodopa 25-100 MG tablet Commonly known as:  SINEMET IR Start half tab three times per day with meals; after 2 weeks, increase to 1 tab three times per day and continue   divalproex 250 MG DR tablet Commonly known as:  DEPAKOTE Take 250 mg by mouth 2 (two) times daily. Take 250mg  BID   docusate sodium 100 MG capsule Commonly known as:  COLACE Take 100 mg by mouth 2 (two) times daily.   doxycycline 100 MG tablet Commonly known as:  VIBRA-TABS 1 po bid with food   fosinopril-hydrochlorothiazide 10-12.5 MG tablet Commonly known as:  MONOPRIL-HCT Take 1 Tablet by mouth once daily   multivitamin tablet Take 1 tablet by mouth daily. For 50 Plus   naproxen 500 MG tablet Commonly known as:  NAPROSYN Take 1 Tablet by mouth 2 times a day with meals   risperiDONE 1 MG tablet Commonly known as:  RISPERDAL Take by mouth. Takes 1 mg po AM and 2 mg po PM.   rosuvastatin 20 MG tablet Commonly known as:  CRESTOR Take 1 Tablet by mouth once daily   Vitamin D3 1000 units Caps Take 3 tablets by mouth daily.   VITAMIN-B COMPLEX PO Take 1 tablet by mouth daily.        Follow-up: Return in about 2 weeks (around 01/08/2017).  Claretta Fraise, M.D.

## 2016-12-27 ENCOUNTER — Telehealth: Payer: Self-pay

## 2016-12-27 ENCOUNTER — Telehealth: Payer: Self-pay | Admitting: Family Medicine

## 2016-12-27 LAB — URINALYSIS
Bilirubin, UA: NEGATIVE
Glucose, UA: NEGATIVE
Ketones, UA: NEGATIVE
Nitrite, UA: POSITIVE — AB
Specific Gravity, UA: 1.01 (ref 1.005–1.030)
Urobilinogen, Ur: 0.2 mg/dL (ref 0.2–1.0)
pH, UA: 7.5 (ref 5.0–7.5)

## 2016-12-27 MED ORDER — PROBIOTIC PO CAPS: 1.0000 | ORAL_CAPSULE | Freq: Two times a day (BID) | ORAL | 5 refills | Status: DC

## 2016-12-27 MED ORDER — CRANBERRY 500 MG PO CHEW: 1.0000 | CHEWABLE_TABLET | Freq: Two times a day (BID) | ORAL | 0 refills | Status: DC

## 2016-12-27 NOTE — Addendum Note (Signed)
Addended by: Claretta Fraise on: 12/27/2016 10:54 AM   Modules accepted: Orders

## 2016-12-27 NOTE — Telephone Encounter (Signed)
Finished, in my out box now - with orders and AVS attached

## 2016-12-27 NOTE — Telephone Encounter (Signed)
Patient needs a note sent to [facility] stating that Mark Benjamin can take cranberry chews and Probiotic

## 2016-12-27 NOTE — Telephone Encounter (Signed)
Paperwork is on your desk.

## 2016-12-27 NOTE — Telephone Encounter (Signed)
Faxed to [employer] [employer]

## 2016-12-28 LAB — URINE CULTURE

## 2017-01-03 ENCOUNTER — Ambulatory Visit: Payer: PPO | Admitting: Physical Therapy

## 2017-01-07 ENCOUNTER — Encounter: Payer: PPO | Admitting: *Deleted

## 2017-01-07 NOTE — Telephone Encounter (Signed)
Received from [facility] in [place], from [place], that do need order from 12-01-16 signed.  Dr. Leta Baptist signed and then faxed to them this am with confirmation.  About below plan.  (ok to stop the sinemet 25-100mg , may restart after UTI treatment at 1/2 tablet po tid for 2 wks then increase to 1 tablet po tid and continue.  Pt is taking 1 tablet po tid of Carbidopa/Levodopa 25-100 at this time.

## 2017-01-10 ENCOUNTER — Ambulatory Visit: Payer: PPO | Admitting: Physical Therapy

## 2017-01-11 ENCOUNTER — Other Ambulatory Visit (INDEPENDENT_AMBULATORY_CARE_PROVIDER_SITE_OTHER): Payer: PPO

## 2017-01-11 DIAGNOSIS — Z23 Encounter for immunization: Secondary | ICD-10-CM | POA: Diagnosis not present

## 2017-01-14 ENCOUNTER — Encounter: Payer: Self-pay | Admitting: Physical Therapy

## 2017-01-14 ENCOUNTER — Ambulatory Visit: Payer: PPO | Attending: Family Medicine | Admitting: Physical Therapy

## 2017-01-14 DIAGNOSIS — R2681 Unsteadiness on feet: Secondary | ICD-10-CM | POA: Diagnosis not present

## 2017-01-14 DIAGNOSIS — R262 Difficulty in walking, not elsewhere classified: Secondary | ICD-10-CM | POA: Diagnosis not present

## 2017-01-14 DIAGNOSIS — M25612 Stiffness of left shoulder, not elsewhere classified: Secondary | ICD-10-CM | POA: Insufficient documentation

## 2017-01-14 DIAGNOSIS — M25622 Stiffness of left elbow, not elsewhere classified: Secondary | ICD-10-CM | POA: Diagnosis not present

## 2017-01-14 DIAGNOSIS — R278 Other lack of coordination: Secondary | ICD-10-CM | POA: Diagnosis not present

## 2017-01-14 DIAGNOSIS — M6281 Muscle weakness (generalized): Secondary | ICD-10-CM | POA: Diagnosis not present

## 2017-01-14 NOTE — Therapy (Signed)
[facility] [place], [place], [address] Phone: [phone]   Fax:  [phone]  Physical Therapy Evaluation  Patient Details  Name: Mark Benjamin MRN: [id number] Date of Birth: 11/24/1960 Referring Provider: Redge Gainer, MD  Encounter Date: 01/14/2017      PT End of Session - 01/14/17 1139    Visit Number 26  1 of 7 this episode   Number of Visits 32   Date for PT Re-Evaluation 10/06/16   Authorization Type [employer]    Authorization Time Period 01/14/17 to 03/16/17   PT Start Time 1030   PT Stop Time 1120   PT Time Calculation (min) 50 min   Equipment Utilized During Treatment Gait belt   Activity Tolerance Patient tolerated treatment well   Behavior During Therapy [facility] for tasks assessed/performed      Past Medical History:  Diagnosis Date  . Cataract   . Colon polyps   . Dysphagia   . Essential hypertension, benign   . Fatty liver   . Hyperplasia of prostate   . 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 Other and unspecified hyperlipidemia   . Schizophrenia [facility])     Past Surgical History:  Procedure Laterality Date  . COLONOSCOPY  02/13/08  . COLONOSCOPY N/A 07/10/2012   Procedure: COLONOSCOPY;  Surgeon: Rogene Houston, MD;  Location: [facility];  Service: Endoscopy;  Laterality: N/A;  225  . UPPER GASTROINTESTINAL ENDOSCOPY  EGD ED   09/09/2010  . URETHRAL DILATION  1965    There were no vitals filed for this visit.       Subjective Assessment - 01/14/17 1131    Subjective Patient arriving today with caregiver from [facility]. Pt wishing to finish out his last PT visits at the outpatient clinic to gain strength and maintain ROM. Pt reporting he likes his new assistive living facility.    Pertinent History Parkinson.  Schizophrenia.   Limitations Walking;Other (comment)   Patient Stated Goals get up from chairs better too, get moving aroudn better    Currently in Pain? No/denies            [facility] PT Assessment - 01/14/17 0001      Assessment   Medical Diagnosis Weakness, Fall Risk, Balance secondary to Parkinson's Disease   Referring Provider Redge Gainer, MD   Hand Dominance Right  due to weakness and tone in L UE   Prior Therapy yes     Precautions   Precautions Fall     Balance Screen   Has the patient fallen in the past 6 months Yes   How many times? 1  pt reporting fall in July   Has the patient had a decrease in activity level because of a fear of falling?  Yes   Is the patient reluctant to leave their home because of a fear of falling?  Yes     Windmill - 2 wheels     Prior Function   Level of Independence Needs assistance with ADLs;Needs assistance with homemaking   Comments pt currently living in [place] using a RW for amb     Cognition   Overall Cognitive Status Impaired/Different from baseline   Behaviors Other (comment)  slow the all movements and with verbal responses     Strength   Left Shoulder Flexion 3-/5  limited ROM   Left Shoulder  ABduction 3-/5  limitd ROM   Left Shoulder Internal Rotation 3-/5  limited ROM   Left Shoulder External Rotation 3-/5  limited ROM   Right/Left Hip Right;Left   Right Hip Flexion 4-/5   Right Hip Extension 4/5   Right Hip ABduction 4/5   Left Hip Flexion 4/5   Left Hip Extension 4/5   Left Hip ABduction 4/5     Bed Mobility   Supine to Sit 5: Supervision   Sit to Supine 6: Modified independent (Device/Increase time)            Objective measurements completed on examination: See above findings.                  PT Education - 01/14/17 1135    Education provided Yes   Education Details Gait training with RW, pt instructed to keep his walker on the ground at all times for support and safety. pt also edu in HEP and handout issued to caregiver upon  pick up.    Person(s) Educated Patient   Methods Explanation;Handout;Verbal cues;Demonstration   Comprehension Verbalized understanding;Returned demonstration;Verbal cues required          PT Short Term Goals - 09/08/16 1804      PT SHORT TERM GOAL #1   Title patient to be able to complete all bed mobility with Mod(I) on familiar and unfamiliar surfaces in order to improve mobility and enhance independence    Time 4   Period Weeks   Status New     PT SHORT TERM GOAL #2   Title Patient to complete TUG test within 13 seconds in order to show improved general mobility and gait efficiency    Time 4   Period Weeks   Status New     PT SHORT TERM GOAL #3   Title Patient to be able to complete 5xSTS in 20 seconds or less without freezing episode in order to show improved mobilty and efficiency of movement    Time 4   Period Weeks   Status New     PT SHORT TERM GOAL #4   Title Patient to be able to verbalize and demonstrate methods to "break through" freezing episodes and improve safety and efficiency of gait/general mobility    Time 4   Period Weeks   Status New     PT SHORT TERM GOAL #5   Title Patient to be compliant with appropriate HEP, to be updated PRN    Time 1   Period Weeks   Status New           PT Long Term Goals - 09/08/16 1806      PT LONG TERM GOAL #1   Title Patient to demonstrate MMT as being 5/5 in all tested groups in order to improve balance and general mobility    Time 8   Period Weeks   Status New     PT LONG TERM GOAL #2   Title Patient to demonstrate at least 30-40% reduction in bradykinesia in order to improve general movement pattern and QOL    Time 8   Period Weeks   Status New     PT LONG TERM GOAL #3   Title Patient to score at least 18 on DGI in order to show improved dynamic balance and reduced fall risk    Time 8   Period Weeks   Status New     PT LONG TERM GOAL #4   Title Patient to be able  to ambulate at least 65ft druing  3MWT in order to show improved mobility and community access    Time 8   Period Weeks   Status New     PT LONG TERM GOAL #5   Title Patient to be able to perform retrogait pattern with equal step lengths and no LOB in order to show improved mobiilty and balance as well as to assist in improving normal gait pattern    Time 8   Period Weeks   Status New                Plan - 05-Feb-2017 1141    Clinical Impression Statement Pt arriving to therapy today as a moderate complexity evaluation. Pt, Pt's sister, and caregiver aware of 7 visit limitation due to insurance. Pt currently living in an assistive living facilty and seems happy. Pt reporting no pain during session. Pt with bilateral UE weakness limited by tone and ROM due to Parkinson's. LE's knee strength grossly 5/5, Ankles grossly 5/5, bilatera hip flexion, abduction, and extension grossly 4/5. Pt with shuffeling gait pattern with forwad flexed posture using a RW to amb. Pt needed verbal and tactile instructions to keep his walker wheels on the ground. Pt with foward momentum when ambulating which increases pt's fall risk. Pt was issued supine exercises along with sit to stand exercises and recripical movment patterns to elicit the best possible techniques for gaining strength and ROM. Skilled PT needed to address pt's impairments and educate assisted living caregivers in order to improve the pt's functional mobility and safety.     History and Personal Factors relevant to plan of care: drug induced parkinson's, schizophrenia   Clinical Presentation Evolving   Clinical Presentation due to: Progressive neurological disease   Clinical Decision Making Moderate   Rehab Potential Good   Clinical Impairments Affecting Rehab Potential (+) motivated to participate in skilled PT services, disease process very addressable by PT   PT Frequency 2x / week   PT Duration 4 weeks  7 visits total   PT Treatment/Interventions ADLs/Self Care Home  Management;Electrical Stimulation;Moist Heat;Patient/family education;Therapeutic exercise;Therapeutic activities;Manual techniques;Passive range of motion;Vasopneumatic Device;Dry needling;Functional mobility training;Gait training;Neuromuscular re-education   PT Next Visit Plan continue gait training with RW, UE and LE ROM and strengthening as tolerated, work on bed mobility   PT Home Exercise Plan [place], SLR, supine shoulder flexion with cane, sit to stand, seated marching   Consulted and Agree with Plan of Care Patient;Family member/caregiver   Family Member Consulted Pamala Hurry from [facility]      Patient will benefit from skilled therapeutic intervention in order to improve the following deficits and impairments:  Abnormal gait, Improper body mechanics, Decreased coordination, Decreased mobility, Postural dysfunction, Decreased range of motion, Decreased strength, Hypomobility, Decreased balance, Difficulty walking, Decreased safety awareness  Visit Diagnosis: Other lack of coordination  Unsteadiness on feet  Muscle weakness (generalized)  Stiffness of left shoulder, not elsewhere classified  Stiffness of left elbow, not elsewhere classified  Difficulty in walking, not elsewhere classified      G-Codes - 02-05-17 1200    Functional Assessment Tool Used (Outpatient Only) Based on skilled clinical assessment of gait, balance, strength, fall risk, Parkinsonian symptoms    Functional Limitation Mobility: Walking and moving around   Mobility: Walking and Moving Around Current Status (D1761) At least 60 percent but less than 80 percent impaired, limited or restricted   Mobility: Walking and Moving Around Goal Status (Y0737) At least 60 percent but less than  80 percent impaired, limited or restricted       Problem List Patient Active Problem List   Diagnosis Date Noted  . Parkinson disease ([place]) 09/05/2015  . Vitamin D deficiency 09/05/2015  . Metabolic syndrome 83/77/9396  .  Hypertension 04/02/2013  . Hyperlipidemia 04/02/2013  . BPH (benign prostatic hyperplasia) 04/02/2013  . Left arm weakness 11/21/2012  . Parkinsonism ([place]) 11/21/2012  . Neck pain 11/21/2012  . Colon adenomas 06/29/2012  . Rectal mass 06/29/2012  . Schizophrenia ([place]) 06/22/2012  . Dysphagia 06/22/2011  . Eosinophilic esophagitis 88/64/8472    Oretha Caprice, MPT 01/14/2017, 12:11 PM  [facility] [address] [place], [place], [address] Phone: [phone]   Fax:  [phone]  Name: Mark Benjamin MRN: [id number] Date of Birth: [birth year]

## 2017-01-17 ENCOUNTER — Encounter: Payer: PPO | Admitting: Physical Therapy

## 2017-01-21 ENCOUNTER — Ambulatory Visit: Payer: PPO | Attending: Family Medicine | Admitting: *Deleted

## 2017-01-21 DIAGNOSIS — R278 Other lack of coordination: Secondary | ICD-10-CM | POA: Diagnosis not present

## 2017-01-21 DIAGNOSIS — M6281 Muscle weakness (generalized): Secondary | ICD-10-CM | POA: Diagnosis not present

## 2017-01-21 DIAGNOSIS — R2689 Other abnormalities of gait and mobility: Secondary | ICD-10-CM

## 2017-01-21 DIAGNOSIS — R2681 Unsteadiness on feet: Secondary | ICD-10-CM

## 2017-01-21 DIAGNOSIS — R262 Difficulty in walking, not elsewhere classified: Secondary | ICD-10-CM

## 2017-01-21 DIAGNOSIS — R29818 Other symptoms and signs involving the nervous system: Secondary | ICD-10-CM | POA: Diagnosis not present

## 2017-01-21 DIAGNOSIS — M25612 Stiffness of left shoulder, not elsewhere classified: Secondary | ICD-10-CM | POA: Diagnosis not present

## 2017-01-21 DIAGNOSIS — M25622 Stiffness of left elbow, not elsewhere classified: Secondary | ICD-10-CM | POA: Diagnosis not present

## 2017-01-21 NOTE — Therapy (Signed)
[facility] [place], [place], [address] Phone: [phone]   Fax:  [phone]  Physical Therapy Treatment  Patient Details  Name: Mark Benjamin MRN: [id number] Date of Birth: 1960/07/19 Referring Provider: Redge Gainer, MD  Encounter Date: 01/21/2017      PT End of Session - 01/21/17 1047    Visit Number 27   Number of Visits 32   Date for PT Re-Evaluation 10/06/16   Authorization Type [employer]    Authorization Time Period 01/14/17 to 03/16/17   PT Start Time 1030   PT Stop Time 1120   PT Time Calculation (min) 50 min      Past Medical History:  Diagnosis Date  . Cataract   . Colon polyps   . Dysphagia   . Essential hypertension, benign   . Fatty liver   . Hyperplasia of prostate   . 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 Other and unspecified hyperlipidemia   . Schizophrenia [facility])     Past Surgical History:  Procedure Laterality Date  . COLONOSCOPY  02/13/08  . COLONOSCOPY N/A 07/10/2012   Procedure: COLONOSCOPY;  Surgeon: Rogene Houston, MD;  Location[facility];  Service: Endoscopy;  Laterality: N/A;  225  . UPPER GASTROINTESTINAL ENDOSCOPY  EGD ED   09/09/2010  . URETHRAL DILATION  1965    There were no vitals filed for this visit.      Subjective Assessment - 01/21/17 1046    Subjective Patient arriving today with caregiver from [facility]. Pt wishing to finish out his last PT visits at the outpatient clinic to gain strength and maintain ROM. Pt reporting he likes his new assistive living facility.    Pertinent History Parkinson.  Schizophrenia.   Limitations Walking;Other (comment)   Patient Stated Goals get up from chairs better too, get moving aroudn better    Currently in Pain? No/denies                         [facility] Adult PT Treatment/Exercise - 01/21/17 0001      Ambulation/Gait   Ambulation/Gait Yes   Ambulation/Gait  Assistance 5: Supervision;4: Min guard   Assistive device Rolling walker   Gait Pattern Festinating;Trunk flexed     Exercises   Exercises Lumbar;Shoulder;Knee/Hip     Lumbar Exercises: Aerobic   Stationary Bike Nustep L5 x 15 mins  V/Cs to keep moving PRN   UBE (Upper Arm Bike) x 5 mins 90 RPMs     Knee/Hip Exercises: Standing   Hip Flexion Stengthening;Both;3 sets;10 reps   Other Standing Knee Exercises Balance UE reaching 4x20 with CGA/SBA   Other Standing Knee Exercises side stepping 3x10 eachside                  PT Short Term Goals - 01/14/17 1213      PT SHORT TERM GOAL #1   Title patient to be able to complete all bed mobility with Mod(I) on familiar and unfamiliar surfaces in order to improve mobility and enhance independence    Time 4   Period Weeks   Status New   Target Date 02/11/17     PT SHORT TERM GOAL #2   Title --------------------------------------------------------------------------------------------     PT SHORT TERM GOAL #3   Title ------------------------------------------------------------------------------------------     PT SHORT TERM GOAL #4   Title ----------------------------------------------------------------------------------------------------     PT SHORT TERM GOAL #5  Title -----------------------------------------------------------------------------------           PT Long Term Goals - 01/14/17 1215      PT LONG TERM GOAL #1   Title Patient to demonstrate MMT as being 5/5 in all tested groups in order to improve balance and general mobility    Time 8   Period Weeks   Status New   Target Date 03/11/17     PT LONG TERM GOAL #2   Title Pt will be able to amb > 500 feet with a RW safely keeping all 4 legs on the ground at all times.    Time 8   Period Weeks   Status New     PT LONG TERM GOAL #3   Title  ------------------------------------------------------------------------------------------------------------------------------     PT LONG TERM GOAL #4   Title ------------------------------------------------------------------------------------------------------------------------------------------------------     PT LONG TERM GOAL #5   Title --------------------------------------------------------------------------------------------------------------------------------------------------------------------------------               Plan - 01/21/17 1049    Clinical Impression Statement Pt did fairly well with Rx today. He needs constant verbal and tactile cues for posture during act's and cues to keep moving at times during exs. He did well with going from sit to stand and maintaining balance but has an uncontrooled descent when going to sit.. Pt had 1 LOB during gait when backing up with FWW.and needed assistance to gain balance   Clinical Decision Making Moderate   Rehab Potential Good   Clinical Impairments Affecting Rehab Potential (+) motivated to participate in skilled PT services, disease process very addressable by PT   PT Frequency 2x / week   PT Duration 4 weeks   PT Treatment/Interventions ADLs/Self Care Home Management;Electrical Stimulation;Moist Heat;Patient/family education;Therapeutic exercise;Therapeutic activities;Manual techniques;Passive range of motion;Vasopneumatic Device;Dry needling;Functional mobility training;Gait training;Neuromuscular re-education   PT Next Visit Plan continue gait training with RW, UE and LE ROM and strengthening as tolerated, work on bed mobility   PT Home Exercise Plan [place], SLR, supine shoulder flexion with cane, sit to stand, seated marching   Consulted and Agree with Plan of Care Patient;Family member/caregiver   Family Member Consulted Pamala Hurry from [facility]      Patient will benefit from skilled therapeutic intervention in order to  improve the following deficits and impairments:  Abnormal gait, Improper body mechanics, Decreased coordination, Decreased mobility, Postural dysfunction, Decreased range of motion, Decreased strength, Hypomobility, Decreased balance, Difficulty walking, Decreased safety awareness  Visit Diagnosis: Other lack of coordination  Muscle weakness (generalized)  Unsteadiness on feet  Stiffness of left shoulder, not elsewhere classified  Stiffness of left elbow, not elsewhere classified  Difficulty in walking, not elsewhere classified  Other symptoms and signs involving the nervous system  Other abnormalities of gait and mobility     Problem List Patient Active Problem List   Diagnosis Date Noted  . Parkinson disease ([place]) 09/05/2015  . Vitamin D deficiency 09/05/2015  . Metabolic syndrome 16/12/9602  . Hypertension 04/02/2013  . Hyperlipidemia 04/02/2013  . BPH (benign prostatic hyperplasia) 04/02/2013  . Left arm weakness 11/21/2012  . Parkinsonism ([place]) 11/21/2012  . Neck pain 11/21/2012  . Colon adenomas 06/29/2012  . Rectal mass 06/29/2012  . Schizophrenia ([place]) 06/22/2012  . Dysphagia 06/22/2011  . Eosinophilic esophagitis 54/11/8117    RAMSEUR,CHRIS, PTA 01/21/2017, 12:36 PM  [facility] [place], [place], [address] Phone: [phone]   Fax:  [phone]  Name: Mark Benjamin MRN: [id number] Date of  Birth: 09/02/1960

## 2017-01-24 ENCOUNTER — Encounter: Payer: Self-pay | Admitting: Physical Therapy

## 2017-01-24 ENCOUNTER — Ambulatory Visit: Payer: PPO | Admitting: Physical Therapy

## 2017-01-24 DIAGNOSIS — R262 Difficulty in walking, not elsewhere classified: Secondary | ICD-10-CM

## 2017-01-24 DIAGNOSIS — R2681 Unsteadiness on feet: Secondary | ICD-10-CM

## 2017-01-24 DIAGNOSIS — R29818 Other symptoms and signs involving the nervous system: Secondary | ICD-10-CM

## 2017-01-24 DIAGNOSIS — R278 Other lack of coordination: Secondary | ICD-10-CM | POA: Diagnosis not present

## 2017-01-24 DIAGNOSIS — M6281 Muscle weakness (generalized): Secondary | ICD-10-CM

## 2017-01-24 DIAGNOSIS — M25612 Stiffness of left shoulder, not elsewhere classified: Secondary | ICD-10-CM

## 2017-01-24 DIAGNOSIS — M25622 Stiffness of left elbow, not elsewhere classified: Secondary | ICD-10-CM

## 2017-01-24 DIAGNOSIS — R2689 Other abnormalities of gait and mobility: Secondary | ICD-10-CM

## 2017-01-24 NOTE — Therapy (Signed)
[facility] [place], [place], [address] Phone: [phone]   Fax:  [phone]  Physical Therapy Treatment  Patient Details  Name: Mark Benjamin MRN: [id number] Date of Birth: [birth year] Referring Provider: Redge Gainer, MD   Encounter Date: 01/24/2017  PT End of Session - 01/24/17 1039    Visit Number  28    Number of Visits  32    Date for PT Re-Evaluation  10/06/16    Authorization Type  [employer]     Authorization Time Period  01/14/17 to 03/16/17    Authorization - Visit Number  25    Authorization - Number of Visits  29    PT Start Time  1033    PT Stop Time  1113    PT Time Calculation (min)  40 min    Activity Tolerance  Patient tolerated treatment well    Behavior During Therapy  [facility] for tasks assessed/performed       Past Medical History:  Diagnosis Date  . Cataract   . Colon polyps   . Dysphagia   . Essential hypertension, benign   . Fatty liver   . Hyperplasia of prostate   . 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 Other and unspecified hyperlipidemia   . Schizophrenia [facility])     Past Surgical History:  Procedure Laterality Date  . COLONOSCOPY  02/13/08  . UPPER GASTROINTESTINAL ENDOSCOPY  EGD ED   09/09/2010  . URETHRAL DILATION  1965    There were no vitals filed for this visit.  Subjective Assessment - 01/24/17 1038    Subjective  Reports being very stiff this morning.    Pertinent History  Parkinson.  Schizophrenia.    Limitations  Walking;Other (comment)    Patient Stated Goals  get up from chairs better too, get moving aroudn better     Currently in Pain?  No/denies         [facility] PT Assessment - 01/24/17 0001      Assessment   Medical Diagnosis  Weakness, Fall Risk, Balance secondary to Parkinson's Disease    Hand Dominance  Right    Prior Therapy  yes      Precautions   Precautions  Fall                  [facility] Adult PT  Treatment/Exercise - 01/24/17 0001      Bed Mobility   Bed Mobility  Rolling Right;Rolling Left    Rolling Right  6: Modified independent (Device/Increase time)    Rolling Left  6: Modified independent (Device/Increase time)    Supine to Sit  6: Modified independent (Device/Increase time)      Lumbar Exercises: Supine   Other Supine Lumbar Exercises  Lumbar rotation x20 reps      Knee/Hip Exercises: Aerobic   Nustep  L4 x10 min      Shoulder Exercises: Supine   Flexion  AROM;Both;20 reps visual cues for reaching   visual cues for reaching   Other Supine Exercises  B AROM D2 x12 reps with visual cues for reaching    Other Supine Exercises  UE rotation x20 reps      Shoulder Exercises: Pulleys   Flexion  2 minutes      Shoulder Exercises: ROM/Strengthening   UBE (Upper Arm Bike)  120 RPM x10 min               PT Short Term Goals -  01/14/17 1213      PT SHORT TERM GOAL #1   Title  patient to be able to complete all bed mobility with Mod(I) on familiar and unfamiliar surfaces in order to improve mobility and enhance independence     Time  4    Period  Weeks    Status  New    Target Date  02/11/17      PT SHORT TERM GOAL #2   Title  --------------------------------------------------------------------------------------------      PT SHORT TERM GOAL #3   Title  ------------------------------------------------------------------------------------------      PT SHORT TERM GOAL #4   Title  ----------------------------------------------------------------------------------------------------      PT SHORT TERM GOAL #5   Title  -----------------------------------------------------------------------------------        PT Long Term Goals - 01/14/17 1215      PT LONG TERM GOAL #1   Title  Patient to demonstrate MMT as being 5/5 in all tested groups in order to improve balance and general mobility     Time  8    Period  Weeks    Status  New    Target Date  03/11/17       PT LONG TERM GOAL #2   Title  Pt will be able to amb > 500 feet with a RW safely keeping all 4 legs on the ground at all times.     Time  8    Period  Weeks    Status  New      PT LONG TERM GOAL #3   Title  ------------------------------------------------------------------------------------------------------------------------------      PT LONG TERM GOAL #4   Title  ------------------------------------------------------------------------------------------------------------------------------------------------------      PT LONG TERM GOAL #5   Title  --------------------------------------------------------------------------------------------------------------------------------------------------------------------------------            Plan - 01/24/17 1124    Clinical Impression Statement  Patient tolerated today's treatment fairly well as he was able to complete exercises as directed with increased verbal and tactile cueing for proper technique and to continue exercise due to freezing. Tone and stiffness present with lumbar and UE rotation to improve disassociation for bed mobility. Bed mobility conducted today was observed with modified independent due to increased time he requires to complete activity. Patient encouraged to continue his HEP at home.     Rehab Potential  Good    Clinical Impairments Affecting Rehab Potential  (+) motivated to participate in skilled PT services, disease process very addressable by PT    PT Frequency  2x / week    PT Duration  4 weeks    PT Treatment/Interventions  ADLs/Self Care Home Management;Electrical Stimulation;Moist Heat;Patient/family education;Therapeutic exercise;Therapeutic activities;Manual techniques;Passive range of motion;Vasopneumatic Device;Dry needling;Functional mobility training;Gait training;Neuromuscular re-education    PT Next Visit Plan  continue gait training with RW, UE and LE ROM and strengthening as tolerated, work on bed  mobility    PT Home Exercise Plan  Briges, SLR, supine shoulder flexion with cane, sit to stand, seated marching    Consulted and Agree with Plan of Care  Patient       Patient will benefit from skilled therapeutic intervention in order to improve the following deficits and impairments:  Abnormal gait, Improper body mechanics, Decreased coordination, Decreased mobility, Postural dysfunction, Decreased range of motion, Decreased strength, Hypomobility, Decreased balance, Difficulty walking, Decreased safety awareness  Visit Diagnosis: Other lack of coordination  Muscle weakness (generalized)  Unsteadiness on feet  Stiffness of left shoulder, not elsewhere classified  Stiffness of  left elbow, not elsewhere classified  Difficulty in walking, not elsewhere classified  Other symptoms and signs involving the nervous system  Other abnormalities of gait and mobility     Problem List Patient Active Problem List   Diagnosis Date Noted  . Parkinson disease ([place]) 09/05/2015  . Vitamin D deficiency 09/05/2015  . Metabolic syndrome 08/81/1031  . Hypertension 04/02/2013  . Hyperlipidemia 04/02/2013  . BPH (benign prostatic hyperplasia) 04/02/2013  . Left arm weakness 11/21/2012  . Parkinsonism ([place]) 11/21/2012  . Neck pain 11/21/2012  . Colon adenomas 06/29/2012  . Rectal mass 06/29/2012  . Schizophrenia ([place]) 06/22/2012  . Dysphagia 06/22/2011  . Eosinophilic esophagitis 59/45/8592    Wynelle Fanny, PTA 01/24/2017, 11:31 AM  [facility] [address] [place], [place], [address] Phone: [phone]   Fax:  [phone]  Name: TIMITHY ARONS MRN: [id number] Date of Birth: May 01, 1960

## 2017-01-28 ENCOUNTER — Ambulatory Visit: Payer: PPO | Admitting: *Deleted

## 2017-01-28 DIAGNOSIS — R262 Difficulty in walking, not elsewhere classified: Secondary | ICD-10-CM

## 2017-01-28 DIAGNOSIS — M25612 Stiffness of left shoulder, not elsewhere classified: Secondary | ICD-10-CM

## 2017-01-28 DIAGNOSIS — R278 Other lack of coordination: Secondary | ICD-10-CM | POA: Diagnosis not present

## 2017-01-28 DIAGNOSIS — R2681 Unsteadiness on feet: Secondary | ICD-10-CM

## 2017-01-28 DIAGNOSIS — M6281 Muscle weakness (generalized): Secondary | ICD-10-CM

## 2017-01-28 DIAGNOSIS — M25622 Stiffness of left elbow, not elsewhere classified: Secondary | ICD-10-CM

## 2017-01-28 DIAGNOSIS — R29818 Other symptoms and signs involving the nervous system: Secondary | ICD-10-CM

## 2017-01-28 DIAGNOSIS — R2689 Other abnormalities of gait and mobility: Secondary | ICD-10-CM

## 2017-01-28 NOTE — Therapy (Signed)
[facility] [place], [place], [address] Phone: [phone]   Fax:  [phone]  Physical Therapy Treatment  Patient Details  Name: Mark Benjamin MRN: [id number] Date of Birth: 13-Oct-1960 Referring Provider: Redge Gainer, MD   Encounter Date: 01/28/2017  PT End of Session - 01/28/17 1158    Visit Number  29    Number of Visits  32    Date for PT Re-Evaluation  10/06/16    Authorization Type  [employer]     Authorization Time Period  01/14/17 to 03/16/17    PT Start Time  1030    PT Stop Time  1120    PT Time Calculation (min)  50 min       Past Medical History:  Diagnosis Date  . Cataract   . Colon polyps   . Dysphagia   . Essential hypertension, benign   . Fatty liver   . Hyperplasia of prostate   . 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 Other and unspecified hyperlipidemia   . Schizophrenia [facility])     Past Surgical History:  Procedure Laterality Date  . COLONOSCOPY  02/13/08  . UPPER GASTROINTESTINAL ENDOSCOPY  EGD ED   09/09/2010  . URETHRAL DILATION  1965    There were no vitals filed for this visit.                   [place] Adult PT Treatment/Exercise - 01/28/17 0001      Ambulation/Gait   Ambulation/Gait Assistance  5: Supervision;4: Min guard    Assistive device  Rolling walker    Gait Pattern  Step-through pattern;Decreased stride length;Trunk flexed      Exercises   Exercises  Lumbar;Shoulder;Knee/Hip      Lumbar Exercises: Seated   Sit to Stand  10 reps cues to not "plop"    Other Seated Lumbar Exercises   Letter X  with upper and lower extremities. x10 Big movements       Knee/Hip Exercises: Aerobic   Nustep  L5 x15 min      Knee/Hip Exercises: Standing   Hip Flexion  Stengthening;Both;3 sets;10 reps Marching big movements    Other Standing Knee Exercises  Balance UE reaching 4x20 with CGA/SBA emphasis on big movements and rotaion    Other Standing Knee Exercises  toe taps on 8 inch block       Knee/Hip Exercises: Seated   Sit to Sand  10 reps      Shoulder Exercises: ROM/Strengthening   UBE (Upper Arm Bike)  120 RPM x6 min               PT Short Term Goals - 01/14/17 1213      PT SHORT TERM GOAL #1   Title  patient to be able to complete all bed mobility with Mod(I) on familiar and unfamiliar surfaces in order to improve mobility and enhance independence     Time  4    Period  Weeks    Status  New    Target Date  02/11/17      PT SHORT TERM GOAL #2   Title  --------------------------------------------------------------------------------------------      PT SHORT TERM GOAL #3   Title  ------------------------------------------------------------------------------------------      PT SHORT TERM GOAL #4   Title  ----------------------------------------------------------------------------------------------------      PT SHORT TERM GOAL #5   Title  -----------------------------------------------------------------------------------  PT Long Term Goals - 01/14/17 1215      PT LONG TERM GOAL #1   Title  Patient to demonstrate MMT as being 5/5 in all tested groups in order to improve balance and general mobility     Time  8    Period  Weeks    Status  New    Target Date  03/11/17      PT LONG TERM GOAL #2   Title  Pt will be able to amb > 500 feet with a RW safely keeping all 4 legs on the ground at all times.     Time  8    Period  Weeks    Status  New      PT LONG TERM GOAL #3   Title  ------------------------------------------------------------------------------------------------------------------------------      PT LONG TERM GOAL #4   Title  ------------------------------------------------------------------------------------------------------------------------------------------------------      PT LONG TERM GOAL #5   Title   --------------------------------------------------------------------------------------------------------------------------------------------------------------------------------            Plan - 01/28/17 1159    Clinical Impression Statement  Pt arrived today in good spirits. He was ambulating better today with increased stride length and better sit to stand. Rx focused on sitting and standing exs with big movement patterns that cross the midline. Pt still needs cueing for posture correction and tone noted in LT hand still.    Clinical Presentation  Evolving    Clinical Decision Making  Moderate    Clinical Impairments Affecting Rehab Potential  (+) motivated to participate in skilled PT services, disease process very addressable by PT    PT Frequency  2x / week    PT Duration  4 weeks    PT Treatment/Interventions  ADLs/Self Care Home Management;Electrical Stimulation;Moist Heat;Patient/family education;Therapeutic exercise;Therapeutic activities;Manual techniques;Passive range of motion;Vasopneumatic Device;Dry needling;Functional mobility training;Gait training;Neuromuscular re-education    PT Next Visit Plan  continue gait training with RW, UE and LE ROM and strengthening as tolerated, work on bed mobility    PT Home Exercise Plan  Briges, SLR, supine shoulder flexion with cane, sit to stand, seated marching    Consulted and Agree with Plan of Care  Patient       Patient will benefit from skilled therapeutic intervention in order to improve the following deficits and impairments:  Abnormal gait, Improper body mechanics, Decreased coordination, Decreased mobility, Postural dysfunction, Decreased range of motion, Decreased strength, Hypomobility, Decreased balance, Difficulty walking, Decreased safety awareness  Visit Diagnosis: Other lack of coordination  Muscle weakness (generalized)  Unsteadiness on feet  Stiffness of left shoulder, not elsewhere classified  Stiffness of left  elbow, not elsewhere classified  Difficulty in walking, not elsewhere classified  Other symptoms and signs involving the nervous system  Other abnormalities of gait and mobility     Problem List Patient Active Problem List   Diagnosis Date Noted  . Parkinson disease ([place]) 09/05/2015  . Vitamin D deficiency 09/05/2015  . Metabolic syndrome 70/26/3785  . Hypertension 04/02/2013  . Hyperlipidemia 04/02/2013  . BPH (benign prostatic hyperplasia) 04/02/2013  . Left arm weakness 11/21/2012  . Parkinsonism ([place]) 11/21/2012  . Neck pain 11/21/2012  . Colon adenomas 06/29/2012  . Rectal mass 06/29/2012  . Schizophrenia ([place]) 06/22/2012  . Dysphagia 06/22/2011  . Eosinophilic esophagitis 88/50/2774    RAMSEUR,CHRIS, PTA 01/28/2017, 12:04 PM  [facility] [address] [place], [place], [address] Phone: [phone]   Fax:  [phone]  Name: Mark Benjamin MRN:  [id number] Date of Birth: 05-Nov-1960

## 2017-01-31 ENCOUNTER — Encounter (INDEPENDENT_AMBULATORY_CARE_PROVIDER_SITE_OTHER): Payer: Self-pay

## 2017-01-31 ENCOUNTER — Ambulatory Visit (INDEPENDENT_AMBULATORY_CARE_PROVIDER_SITE_OTHER): Payer: PPO | Admitting: Diagnostic Neuroimaging

## 2017-01-31 ENCOUNTER — Encounter: Payer: Self-pay | Admitting: Diagnostic Neuroimaging

## 2017-01-31 VITALS — BP 117/75 | HR 75 | Ht 73.0 in | Wt 168.6 lb

## 2017-01-31 DIAGNOSIS — G20 Parkinson's disease: Secondary | ICD-10-CM | POA: Diagnosis not present

## 2017-01-31 DIAGNOSIS — G2119 Other drug induced secondary parkinsonism: Secondary | ICD-10-CM

## 2017-01-31 DIAGNOSIS — M4802 Spinal stenosis, cervical region: Secondary | ICD-10-CM | POA: Diagnosis not present

## 2017-01-31 NOTE — Patient Instructions (Signed)
-  continue current medications

## 2017-01-31 NOTE — Progress Notes (Signed)
[facility]  PATIENT: Mark Benjamin DOB: 1960/11/26  REFERRING CLINICIAN: Alphonse Guild HISTORY FROM: patient and sister REASON FOR VISIT: follow up   HISTORICAL  CHIEF COMPLAINT:  Chief Complaint  Patient presents with  . Follow-up  . PD    resides at [place].  doing better.   Taking sinemt 25-100 one tabl po TID    HISTORY OF PRESENT ILLNESS:   UPDATE (01/31/17, VRP): Since last visit, doing well. Tolerating carb/levo. No alleviating or aggravating factors.   UPDATE 09/29/16: Since last visit, tremors and coordination have worsened. Balance poor. Had a minor fall 3 weeks ago. Sister is looking at options for PACE vs assisted living. She is concerned about his safety to live alone. He is no longer driving. They were not able to afford [employer] ($14k out of pocket).   UPDATE 06/15/16: Since last visit, tremor, slowness, stiffness continue. Here to look at other options for treatment. Schizophrenia stable. More balance and coordination issues. Patient lives alone. Driving short distances.   UPDATE 09/22/15 (VRP): Since last visit, tremor and parkinsonism sxs continue. Saw Dr. Olga Millers, but was told that his spine issues are not amenable to surgical treatment.   UPDATE 06/04/14 (VRP): Since last visit, patient has transitioned from thioridazine to risperdal and cogentin and depakote. Left arm symptoms are stable / slightly improved. Patient not interested in surgical treatment of c-spine surg; also, was told by neurosurg that his neck issues do not need surg decompression.  UPDATE 08/10/13 (LL): Patient states that he was evaluated by Dr. Vertell Limber for cervical stenosis and was told that there was nothing seen on Myelogram that needed surgery. His Thioridazine dose is now 100 mg. Symptoms are stable. He was advised by PCP to cut out artificial sweeteners to see if that changed his symptoms; he did so 2 weeks ago. He complains of mild left leg weakness.  UPDATE 02/26/13  (VRP): Since last visit patient had MRI of the brain and cervical spine which shows moderate spinal stenosis at C5-6 level with severe bilateral foraminal stenosis. Also mild spinal stenosis and moderate right foraminal stenosis at C6-7. No cord signal abnormalities. Since last visit thioridazine dosing has been reduced. Overall his symptoms are stable to slightly progressed.  PRIOR HPI (11/21/12, VRP): 56 year old left-handed male with history of diabetes, hypercholesterolemia, schizophrenia, on thioridazine since 1986, here for evaluation of left arm weakness and numbness since March 2014. Patient reports gradual onset, progressive numbness and weakness and slowness of his left arm. He denies any symptoms in his right arm or legs. No facial droop or facial numbness. He has noticed some slurred speech over the past one week. Patient is left-handed and has noticed difficulty with brushing his teeth with his left hand, handwriting and other fine movements. Patient's psychiatrist reduced thioridazine dose from 300 mg at bedtime down to 200 mg at bedtime last month, out of consideration of the patient's presenting symptoms as a potential side effect of medication. Since reducing the dose, no change in symptoms.   REVIEW OF SYSTEMS: Full 14 system review of systems performed and negative: dizziness speech diff tremors decr concentration drooling wt loss incontinence.    ALLERGIES: Allergies  Allergen Reactions  . Amoxicillin     Dizzy and blurred vision  . Erythromycin   . Sulfamethoxazole-Trimethoprim Other (See Comments)    Extreme weakness/lethorgy  . Zocor [Simvastatin]     HOME MEDICATIONS: Outpatient Medications Prior to Visit  Medication Sig Dispense Refill  . benztropine (  COGENTIN) 1 MG tablet Take 1 tablet by mouth 2 (two) times daily.    . carbidopa-levodopa (SINEMET IR) 25-100 MG tablet Start half tab three times per day with meals; after 2 weeks, increase to 1 tab three times per day and  continue 90 tablet 6  . Cholecalciferol (VITAMIN D3) 1000 UNITS CAPS Take 3 tablets by mouth daily.     . divalproex (DEPAKOTE) 250 MG DR tablet Take 250 mg by mouth 2 (two) times daily. Take 250mg  BID    . docusate sodium (COLACE) 100 MG capsule Take 100 mg by mouth 2 (two) times daily.    . fosinopril-hydrochlorothiazide (MONOPRIL-HCT) 10-12.5 MG tablet Take 1 Tablet by mouth once daily 90 tablet 1  . Multiple Vitamin (MULTIVITAMIN) tablet Take 1 tablet by mouth daily. For 50 Plus    . Probiotic CAPS Take 1 capsule by mouth 2 (two) times daily. 60 capsule 5  . risperiDONE (RISPERDAL) 1 MG tablet Take by mouth. Takes 1 mg po AM and 2 mg po PM.    . Cranberry 500 MG CHEW Chew 1 tablet by mouth 2 (two) times daily. 60 tablet 0  . B Complex Vitamins (VITAMIN-B COMPLEX PO) Take 1 tablet by mouth daily.    . ciprofloxacin (CIPRO) 500 MG tablet Take 1 tablet (500 mg total) by mouth 2 (two) times daily. (Patient not taking: Reported on 01/31/2017) 28 tablet 0  . doxycycline (VIBRA-TABS) 100 MG tablet 1 po bid with food (Patient not taking: Reported on 01/31/2017) 28 tablet 0  . naproxen (NAPROSYN) 500 MG tablet Take 1 Tablet by mouth 2 times a day with meals (Patient not taking: Reported on 01/31/2017) 60 tablet 1  . rosuvastatin (CRESTOR) 20 MG tablet Take 1 Tablet by mouth once daily (Patient not taking: Reported on 01/31/2017) 90 tablet 0   No facility-administered medications prior to visit.     PAST MEDICAL HISTORY: Past Medical History:  Diagnosis Date  . Cataract   . Colon polyps   . Dysphagia   . Essential hypertension, benign   . Fatty liver   . Hyperplasia of prostate   . 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 Other and unspecified hyperlipidemia   . Schizophrenia ([place])     PAST SURGICAL HISTORY: Past Surgical History:  Procedure Laterality Date  . COLONOSCOPY  02/13/08  . UPPER GASTROINTESTINAL ENDOSCOPY  EGD ED   09/09/2010  . URETHRAL DILATION   1965    FAMILY HISTORY: Family History  Problem Relation Age of Onset  . Heart failure Mother   . ALS Father   . Hyperlipidemia Sister   . Inflammatory bowel disease Paternal Grandfather     SOCIAL HISTORY:  Social History   Socioeconomic History  . Marital status: Single    Spouse name: Not on file  . Number of children: 0  . Years of education: 12th  . Highest education level: Not on file  Social Needs  . Financial resource strain: Not on file  . Food insecurity - worry: Not on file  . Food insecurity - inability: Not on file  . Transportation needs - medical: Not on file  . Transportation needs - non-medical: Not on file  Occupational History    Employer: NOT EMPLOYED  Tobacco Use  . Smoking status: Never Smoker  . Smokeless tobacco: Never Used  Substance and Sexual Activity  . Alcohol use: Yes    Comment: very rare  . Drug use: No  . Sexual  activity: No  Other Topics Concern  . Not on file  Social History Narrative   06/15/16 Patient lives alone in apartment.   Caffeine Use: none   Patient is both right and left handed.     PHYSICAL EXAM  Vitals:   01/31/17 1407  BP: 117/75  Pulse: 75  Weight: 168 lb 9.6 oz (76.5 kg)  Height: 6\' 1"  (1.854 m)    Not recorded      Body mass index is 22.24 kg/m.  GENERAL EXAM: Patient is in no distress  CARDIOVASCULAR: Regular rate and rhythm, no murmurs, no carotid bruits  NEUROLOGIC: MENTAL STATUS: awake, alert, language fluent, comprehension intact, naming intact; SEVERE MASKED FACIES. SEVERE PAUCITY OF SPEECH OUTPUT CRANIAL NERVE: pupils equal and reactive to light, visual fields full to confrontation, extraocular muscles intact, no nystagmus, facial sensation and strength symmetric, uvula midline, shoulder shrug symmetric, tongue midline. MOTOR: INCREASED TONE IN LUE WITH RARE, MINOR REST TREMOR IN LUE. SIGNIFICANT BRADYKINESIA IN LUE. DYSTONIC POSTURE OF LEFT HAND. RUE AND BLE 5. LUE FINGER ABDUCTION AND  EXTENSION 3.  SENSORY: normal and symmetric to light touch COORDINATION: finger-nose-finger, fine finger movements normal REFLEXES: RUE 3, LUE 2, KNEES 2, ANKLES 2 GAIT/STATION: narrow based gait; GAIT FREEZING; STOOPED POSTURE; SHORT STEPS; ABSENT ARM SWING. USING WALKER.    DIAGNOSTIC DATA (LABS, IMAGING, TESTING) - I reviewed patient records, labs, notes, testing and imaging myself where available.  Lab Results  Component Value Date   WBC 5.5 10/06/2016   HGB 13.3 10/06/2016   HCT 39.8 10/06/2016   MCV 92 10/06/2016   PLT 207 10/06/2016      Component Value Date/Time   NA 141 10/06/2016 1219   K 4.2 10/06/2016 1219   CL 99 10/06/2016 1219   CO2 26 10/06/2016 1219   GLUCOSE 89 10/06/2016 1219   GLUCOSE 94 04/24/2013 0928   BUN 11 10/06/2016 1219   CREATININE 0.84 10/06/2016 1219   CREATININE 0.86 04/24/2013 0928   CALCIUM 9.8 10/06/2016 1219   PROT 6.7 10/06/2016 1219   ALBUMIN 4.6 10/06/2016 1219   AST 25 10/06/2016 1219   ALT 19 10/06/2016 1219   ALKPHOS 52 10/06/2016 1219   BILITOT 1.4 (H) 10/06/2016 1219   GFRNONAA 98 10/06/2016 1219   GFRNONAA >89 04/24/2013 0928   GFRAA 113 10/06/2016 1219   GFRAA >89 04/24/2013 0928   Lab Results  Component Value Date   CHOL 98 (L) 10/06/2016   HDL 51 10/06/2016   LDLCALC 37 10/06/2016   TRIG 50 10/06/2016   CHOLHDL 1.9 10/06/2016   Lab Results  Component Value Date   HGBA1C 4.8 11/08/2013   Lab Results  Component Value Date   ZOXWRUEA54 098 06/03/2016   Lab Results  Component Value Date   TSH 1.310 06/03/2016    11/29/12 MRI cervical  1. At C5-6: disc bulging with moderate spinal stenosis and severe biforaminal stenosis  2. At C3-4, C4-5, C6-7: disc bulging with mild spinal stenosis and severe biforaminal stenosis  3. No cord signal abnormalities.  11/29/12 MRI brain (without) demonstrating:  1. Single right frontal subcortical focus (104mm) of non-specific gliosis.  2. No acute findings.  04/12/13 CT  myelogram cervical spine - Central disc herniation at C3-4 with moderate central stenosis. Severe left foraminal narrowing at this level. Moderate central stenosis at C5-6 and C6-7 secondary to posterior disc osteophytes.  06/05/15 MRI cervical spine [I reviewed images myself and agree with interpretation. -VRP]  1. At C3-4 there is a  mild broad-based disc bulge. Left uncovertebral degenerative change and left facet arthropathy resulting in severe left foraminal stenosis. Mild right foraminal stenosis. Mild spinal stenosis. 2. Cervical spine spondylosis as described above.     ASSESSMENT AND PLAN  56 y.o. year old male here with gradual onset, progressive left arm weakness, slowness and stiffness with some numbness. On exam, patient has masked facies, rigidity in the left arm, bradykinesia in the left arm, stooped posture and poor arm swing.  Dx: parkinsonism (likely idiopathic parkinson's disease; drug induced less likely as symptoms have continued to progress in spite of reducing anti-psychotic medications) + mild cervical spinal stenosis and cervical radiculopathy  Parkinson's disease ([place])  Drug-induced Parkinsonism ([place])  Spinal stenosis in cervical region    PLAN:  PARKINSON'S DISEASE (established problem, worsening) - continue carb/levo 1 tab three times a day  - increase safety and supervision; caution with living alone and driving - continue physical activity / exercises / PT - ordered DATscan to eval for parkinson's disease vs drug-induced parkinsonism --> but not affordable at this time - continue to use lowest possible dose of anti-psychotic medication (Dr. Letitia Caul; psychiatry)  Return in about 4 months (around 05/31/2017) for 4-6 months.    Penni Bombard, MD 38/17/7116, 5:79 PM Certified in Neurology, Neurophysiology and Neuroimaging  [facility] [address], [place],  [address] [phone]

## 2017-02-04 ENCOUNTER — Encounter: Payer: Self-pay | Admitting: Physical Therapy

## 2017-02-04 ENCOUNTER — Ambulatory Visit: Payer: PPO | Admitting: Physical Therapy

## 2017-02-04 DIAGNOSIS — M6281 Muscle weakness (generalized): Secondary | ICD-10-CM

## 2017-02-04 DIAGNOSIS — R278 Other lack of coordination: Secondary | ICD-10-CM | POA: Diagnosis not present

## 2017-02-04 DIAGNOSIS — M25612 Stiffness of left shoulder, not elsewhere classified: Secondary | ICD-10-CM

## 2017-02-04 DIAGNOSIS — R2681 Unsteadiness on feet: Secondary | ICD-10-CM

## 2017-02-04 DIAGNOSIS — M25622 Stiffness of left elbow, not elsewhere classified: Secondary | ICD-10-CM

## 2017-02-04 NOTE — Therapy (Signed)
[name], [place], [address] Phone: [phone]   Fax:  [phone]  Physical Therapy Treatment  Patient Details  Name: Mark Benjamin MRN: [id number] Date of Birth: Mar 28, 1960 Referring Provider: Redge Gainer, MD   Encounter Date: 02/04/2017  PT End of Session - 02/04/17 1113    Visit Number  30    Number of Visits  32    Date for PT Re-Evaluation  10/06/16    Authorization Type  [employer]     Authorization Time Period  01/14/17 to 03/16/17    Authorization - Visit Number  25    Authorization - Number of Visits  29    PT Start Time  1117    PT Stop Time  1200    PT Time Calculation (min)  43 min    Activity Tolerance  Patient tolerated treatment well    Behavior During Therapy  [facility] for tasks assessed/performed       Past Medical History:  Diagnosis Date  . Cataract   . Colon polyps   . Dysphagia   . Essential hypertension, benign   . Fatty liver   . Hyperplasia of prostate   . 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 Other and unspecified hyperlipidemia   . Schizophrenia [facility])     Past Surgical History:  Procedure Laterality Date  . COLONOSCOPY  02/13/08  . COLONOSCOPY N/A 07/10/2012   Performed by Rogene Houston, MD at [place]  . UPPER GASTROINTESTINAL ENDOSCOPY  EGD ED   09/09/2010  . URETHRAL DILATION  1965    There were no vitals filed for this visit.  Subjective Assessment - 02/04/17 1112    Subjective  Denies any pain today. Reports that his sister had showed him the hand grip exerciser she had bought and states he had been using his squeeze ball.    Pertinent History  Parkinson.  Schizophrenia.    Limitations  Walking;Other (comment)    Patient Stated Goals  get up from chairs better too, get moving aroudn better     Currently in Pain?  No/denies         [facility] PT Assessment - 02/04/17 0001      Assessment   Medical Diagnosis  Weakness,  Fall Risk, Balance secondary to Parkinson's Disease    Hand Dominance  Right    Prior Therapy  yes      Precautions   Precautions  [facility] Adult PT Treatment/Exercise - 02/04/17 0001      Ambulation/Gait   Ambulation/Gait  Yes    Ambulation/Gait Assistance  5: Supervision    Ambulation/Gait Assistance Details  Min VCs for safety, legs on floor, looking ahead    Ambulation Distance (Feet)  650 Feet    Assistive device  Rolling walker    Gait Pattern  Step-through pattern;Decreased stride length;Ataxic;Trunk flexed;Narrow base of support    Ambulation Surface  Level;Indoor      Knee/Hip Exercises: Aerobic   Nustep  L7 x15 min      Knee/Hip Exercises: Standing   Hip Abduction  AROM;Both;20 reps;Knee straight    Functional Squat  15 reps at sink      Shoulder Exercises: Seated   Flexion  AROM;Both;12 reps    Abduction  AROM;Both;15 reps    Other Seated Exercises  B seated across body reaching  x10 reps each      Shoulder Exercises: ROM/Strengthening   UBE (Upper Arm Bike)  60 RPM x6 min (3 min forward, 3 min backwards)               PT Short Term Goals - 02/04/17 1121      PT SHORT TERM GOAL #1   Title  patient to be able to complete all bed mobility with Mod(I) on familiar and unfamiliar surfaces in order to improve mobility and enhance independence     Time  4    Period  Weeks    Status  Achieved      PT SHORT TERM GOAL #2   Title  --------------------------------------------------------------------------------------------      PT SHORT TERM GOAL #3   Title  ------------------------------------------------------------------------------------------      PT SHORT TERM GOAL #4   Title  ----------------------------------------------------------------------------------------------------      PT SHORT TERM GOAL #5   Title  -----------------------------------------------------------------------------------        PT Long Term  Goals - 02/04/17 1222      PT LONG TERM GOAL #1   Title  Patient to demonstrate MMT as being 5/5 in all tested groups in order to improve balance and general mobility     Time  8    Period  Weeks    Status  On-going      PT LONG TERM GOAL #2   Title  Pt will be able to amb > 500 feet with a RW safely keeping all 4 legs on the ground at all times.     Time  8    Period  Weeks    Status  Achieved      PT LONG TERM GOAL #3   Title  ------------------------------------------------------------------------------------------------------------------------------      PT LONG TERM GOAL #4   Title  ------------------------------------------------------------------------------------------------------------------------------------------------------      PT LONG TERM GOAL #5   Title  --------------------------------------------------------------------------------------------------------------------------------------------------------------------------------            Plan - 02/04/17 1208    Clinical Impression Statement  Patient tolerated today's treatment well although he still experiences freezing periods throughout treatment and VCs were provided to continue and breakthrough freezing periods. Patient instructed during gait for erect cervical position, safety with turning corners and doorways. Patient maintained contact with RW legs on floor except when maneuvering close areas. Patient still lacking full L shoulder flexion and abduction and had difficulty in crossing over for reaching. Patient's sister also demonstrated a resisted hand gripper that she purchased at [facility] in hopes that that would assist with L hand strengthening.    Rehab Potential  Good    Clinical Impairments Affecting Rehab Potential  (+) motivated to participate in skilled PT services, disease process very addressable by PT    PT Frequency  2x / week    PT Duration  4 weeks    PT Treatment/Interventions  ADLs/Self Care  Home Management;Electrical Stimulation;Moist Heat;Patient/family education;Therapeutic exercise;Therapeutic activities;Manual techniques;Passive range of motion;Vasopneumatic Device;Dry needling;Functional mobility training;Gait training;Neuromuscular re-education    PT Next Visit Plan  continue gait training with RW, UE and LE ROM and strengthening as tolerated, work on bed mobility    PT Home Exercise Plan  Briges, SLR, supine shoulder flexion with cane, sit to stand, seated marching    Consulted and Agree with Plan of Care  Patient;Family member/caregiver    Family Member Consulted  Sister       Patient will benefit from skilled therapeutic intervention in order  to improve the following deficits and impairments:  Abnormal gait, Improper body mechanics, Decreased coordination, Decreased mobility, Postural dysfunction, Decreased range of motion, Decreased strength, Hypomobility, Decreased balance, Difficulty walking, Decreased safety awareness  Visit Diagnosis: Other lack of coordination  Muscle weakness (generalized)  Unsteadiness on feet  Stiffness of left shoulder, not elsewhere classified  Stiffness of left elbow, not elsewhere classified     Problem List Patient Active Problem List   Diagnosis Date Noted  . Parkinson disease ([place]) 09/05/2015  . Vitamin D deficiency 09/05/2015  . Metabolic syndrome 03/00/9233  . Hypertension 04/02/2013  . Hyperlipidemia 04/02/2013  . BPH (benign prostatic hyperplasia) 04/02/2013  . Left arm weakness 11/21/2012  . Parkinsonism ([place]) 11/21/2012  . Neck pain 11/21/2012  . Colon adenomas 06/29/2012  . Rectal mass 06/29/2012  . Schizophrenia ([place]) 06/22/2012  . Dysphagia 06/22/2011  . Eosinophilic esophagitis 00/76/2263    Standley Brooking, PTA 02/04/2017, 12:22 PM  [facility] [address] [place], [place], [address] Phone: [phone]   Fax:  [phone]  Name: RONALD LONDO MRN:  [id number] Date of Birth: 01/28/1961

## 2017-02-07 ENCOUNTER — Ambulatory Visit: Payer: PPO | Admitting: Physical Therapy

## 2017-02-07 ENCOUNTER — Encounter: Payer: Self-pay | Admitting: Physical Therapy

## 2017-02-07 DIAGNOSIS — R278 Other lack of coordination: Secondary | ICD-10-CM

## 2017-02-07 DIAGNOSIS — R262 Difficulty in walking, not elsewhere classified: Secondary | ICD-10-CM

## 2017-02-07 DIAGNOSIS — R2681 Unsteadiness on feet: Secondary | ICD-10-CM

## 2017-02-07 DIAGNOSIS — M25622 Stiffness of left elbow, not elsewhere classified: Secondary | ICD-10-CM

## 2017-02-07 DIAGNOSIS — M6281 Muscle weakness (generalized): Secondary | ICD-10-CM

## 2017-02-07 DIAGNOSIS — M25612 Stiffness of left shoulder, not elsewhere classified: Secondary | ICD-10-CM

## 2017-02-07 NOTE — Therapy (Signed)
[facility] [place], [place], [address] Phone: [phone]   Fax:  [phone]  Physical Therapy Treatment  Patient Details  Name: Mark Benjamin MRN: [id number] Date of Birth: 1960-07-07 Referring Provider: Redge Gainer, MD   Encounter Date: 02/07/2017  PT End of Session - 02/07/17 1233    Visit Number  31    Number of Visits  32    Date for PT Re-Evaluation  03/16/17    Authorization Type  [employer]     Authorization Time Period  01/14/17 to 03/16/17    PT Start Time  1030    PT Stop Time  1115    PT Time Calculation (min)  45 min    Equipment Utilized During Treatment  Gait belt    Activity Tolerance  Patient tolerated treatment well    Behavior During Therapy  [facility] for tasks assessed/performed       Past Medical History:  Diagnosis Date  . Cataract   . Colon polyps   . Dysphagia   . Essential hypertension, benign   . Fatty liver   . Hyperplasia of prostate   . 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 Other and unspecified hyperlipidemia   . Schizophrenia [facility])     Past Surgical History:  Procedure Laterality Date  . COLONOSCOPY  02/13/08  . COLONOSCOPY N/A 07/10/2012   Performed by Rogene Houston, MD at [facility]  . UPPER GASTROINTESTINAL ENDOSCOPY  [facility] ED   09/09/2010  . URETHRAL DILATION  1965    There were no vitals filed for this visit.  Subjective Assessment - 02/07/17 1232    Subjective  Pt arriving to therapy reporting no pain. Pt has one more therapy visit following today.     Pertinent History  Parkinson.  Schizophrenia.    Limitations  Walking;Other (comment)    Patient Stated Goals  get up from chairs better too, get moving aroudn better     Currently in Pain?  No/denies                              PT Education - 02/07/17 1232    Education provided  Yes    Education Details  Reviewed gait training on uneven surfaces  around clinic building and on sidewalk surfaces keeping his walker on ground at all times for stability and balance.     Person(s) Educated  Patient    Methods  Explanation;Demonstration;Verbal cues    Comprehension  Verbalized understanding;Returned demonstration       PT Short Term Goals - 02/04/17 1121      PT SHORT TERM GOAL #1   Title  patient to be able to complete all bed mobility with Mod(I) on familiar and unfamiliar surfaces in order to improve mobility and enhance independence     Time  4    Period  Weeks    Status  Achieved      PT SHORT TERM GOAL #2   Title  --------------------------------------------------------------------------------------------      PT SHORT TERM GOAL #3   Title  ------------------------------------------------------------------------------------------      PT SHORT TERM GOAL #4   Title  ----------------------------------------------------------------------------------------------------      PT SHORT TERM GOAL #5   Title  -----------------------------------------------------------------------------------        PT Long Term Goals - 02/07/17 1236      PT LONG TERM GOAL #1  Title  Patient to demonstrate MMT as being 5/5 in all tested groups in order to improve balance and general mobility     Period  Weeks    Status  On-going      PT LONG TERM GOAL #2   Title  Pt will be able to amb > 500 feet with a RW safely keeping all 4 legs on the ground at all times.     Time  8    Period  Weeks    Status  Achieved            Plan - 02/07/17 1234    Clinical Impression Statement  Pt tolerated treatment well today, still requiring verbal cues at times to keep all walker legs on the floor. Pt worked on functional mobility, gait, posture and balance today. One more therapy visit before discharge. Continue POC.     Rehab Potential  Good    Clinical Impairments Affecting Rehab Potential  (+) motivated to participate in skilled PT services,  disease process very addressable by PT    PT Frequency  2x / week    PT Duration  4 weeks    PT Treatment/Interventions  ADLs/Self Care Home Management;Electrical Stimulation;Moist Heat;Patient/family education;Therapeutic exercise;Therapeutic activities;Manual techniques;Passive range of motion;Vasopneumatic Device;Dry needling;Functional mobility training;Gait training;Neuromuscular re-education    PT Next Visit Plan  continue gait training with RW, UE and LE ROM and strengthening as tolerated, work on bed mobility    PT Home Exercise Plan  Briges, SLR, supine shoulder flexion with cane, sit to stand, seated marching       Patient will benefit from skilled therapeutic intervention in order to improve the following deficits and impairments:  Abnormal gait, Improper body mechanics, Decreased coordination, Decreased mobility, Postural dysfunction, Decreased range of motion, Decreased strength, Hypomobility, Decreased balance, Difficulty walking, Decreased safety awareness  Visit Diagnosis: Other lack of coordination  Difficulty in walking, not elsewhere classified  Muscle weakness (generalized)  Unsteadiness on feet  Stiffness of left shoulder, not elsewhere classified  Stiffness of left elbow, not elsewhere classified     Problem List Patient Active Problem List   Diagnosis Date Noted  . Parkinson disease ([place]) 09/05/2015  . Vitamin D deficiency 09/05/2015  . Metabolic syndrome 40/10/6759  . Hypertension 04/02/2013  . Hyperlipidemia 04/02/2013  . BPH (benign prostatic hyperplasia) 04/02/2013  . Left arm weakness 11/21/2012  . Parkinsonism ([place]) 11/21/2012  . Neck pain 11/21/2012  . Colon adenomas 06/29/2012  . Rectal mass 06/29/2012  . Schizophrenia ([place]) 06/22/2012  . Dysphagia 06/22/2011  . Eosinophilic esophagitis 95/11/3265    Oretha Caprice, MPT 02/07/2017, 12:50 PM  [facility] [place], [place],  [address] Phone: [phone]   Fax:  [phone]  Name: Mark Benjamin MRN: [id number] Date of Birth: Nov 08, 1960

## 2017-02-08 ENCOUNTER — Encounter: Payer: Self-pay | Admitting: Family Medicine

## 2017-02-08 ENCOUNTER — Ambulatory Visit: Payer: PPO | Admitting: Family Medicine

## 2017-02-08 VITALS — BP 126/83 | HR 93 | Temp 97.3°F | Ht 73.0 in | Wt 166.0 lb

## 2017-02-08 DIAGNOSIS — R269 Unspecified abnormalities of gait and mobility: Secondary | ICD-10-CM

## 2017-02-08 DIAGNOSIS — E559 Vitamin D deficiency, unspecified: Secondary | ICD-10-CM | POA: Diagnosis not present

## 2017-02-08 DIAGNOSIS — M6281 Muscle weakness (generalized): Secondary | ICD-10-CM | POA: Diagnosis not present

## 2017-02-08 DIAGNOSIS — G20 Parkinson's disease: Secondary | ICD-10-CM | POA: Diagnosis not present

## 2017-02-08 DIAGNOSIS — N400 Enlarged prostate without lower urinary tract symptoms: Secondary | ICD-10-CM | POA: Diagnosis not present

## 2017-02-08 DIAGNOSIS — E7800 Pure hypercholesterolemia, unspecified: Secondary | ICD-10-CM

## 2017-02-08 DIAGNOSIS — I10 Essential (primary) hypertension: Secondary | ICD-10-CM

## 2017-02-08 DIAGNOSIS — R2681 Unsteadiness on feet: Secondary | ICD-10-CM | POA: Diagnosis not present

## 2017-02-08 DIAGNOSIS — M4802 Spinal stenosis, cervical region: Secondary | ICD-10-CM | POA: Diagnosis not present

## 2017-02-08 DIAGNOSIS — F209 Schizophrenia, unspecified: Secondary | ICD-10-CM

## 2017-02-08 LAB — URINALYSIS, COMPLETE
Bilirubin, UA: NEGATIVE
Glucose, UA: NEGATIVE
Ketones, UA: NEGATIVE
LEUKOCYTES UA: NEGATIVE
Nitrite, UA: NEGATIVE
PH UA: 7.5 (ref 5.0–7.5)
PROTEIN UA: NEGATIVE
RBC, UA: NEGATIVE
Specific Gravity, UA: 1.015 (ref 1.005–1.030)
Urobilinogen, Ur: 0.2 mg/dL (ref 0.2–1.0)

## 2017-02-08 LAB — MICROSCOPIC EXAMINATION
BACTERIA UA: NONE SEEN
RENAL EPITHEL UA: NONE SEEN /HPF

## 2017-02-08 NOTE — Progress Notes (Signed)
Subjective:    Patient ID: Mark Benjamin, male    DOB: [birth year], 56 y.o.   MRN: [id number]  HPI Pt here for follow up and management of chronic medical problems which includes hypertension and hyperlipidemia. He is taking medication regularly.  The patient comes with his sister [name], to the visit today.  He complains of right lower arm pain.  He does have the cervical disc disease and spinal stenosis.  He is recently seen the neurologist and that note was reviewed.  He is also getting physical therapy.  He seems to be doing better since he has been started on the Sinemet.  He is happy at [facility] and his sister says that he gets along well with his roommate.  He continues to see the psychiatrist, Dr. Terrill Mohr.  The neurologist has asked to keep those medications at is lower dose as possible.  The patient denies any chest pain or shortness of breath.  He has had some loose bowel movements and the stool softeners been reduced to 1 stool softener daily instead of 2 different stool softeners.  He is also been taking 2 different probiotics and we will reduce that to 1 probiotic.  Otherwise he has not seen any blood in the stool or had any black tarry bowel movements.  He is passing his water well.  His sister who is with him today indicates that the addition of Sinemet has helped him especially in the lower extremities.  He still has some rigidity in the upper extremities and some special rigidity in the left hand.     Patient Active Problem List   Diagnosis Date Noted  . Parkinson disease ([place]) 09/05/2015  . Vitamin D deficiency 09/05/2015  . Metabolic syndrome 56/43/3295  . Hypertension 04/02/2013  . Hyperlipidemia 04/02/2013  . BPH (benign prostatic hyperplasia) 04/02/2013  . Left arm weakness 11/21/2012  . Parkinsonism ([place]) 11/21/2012  . Neck pain 11/21/2012  . Colon adenomas 06/29/2012  . Rectal mass 06/29/2012  . Schizophrenia ([place]) 06/22/2012  . Dysphagia 06/22/2011  . Eosinophilic  esophagitis 18/84/1660   Outpatient Encounter Medications as of 02/08/2017  Medication Sig  . benztropine (COGENTIN) 1 MG tablet Take 1 tablet by mouth 2 (two) times daily.  . carbidopa-levodopa (SINEMET IR) 25-100 MG tablet Start half tab three times per day with meals; after 2 weeks, increase to 1 tab three times per day and continue  . Cholecalciferol (VITAMIN D3) 1000 UNITS CAPS Take 3 tablets by mouth daily.   . Cranberry 500 MG CHEW Chew 1 tablet by mouth 2 (two) times daily.  . divalproex (DEPAKOTE) 250 MG DR tablet Take 250 mg by mouth 2 (two) times daily. Take 248m BID  . docusate sodium (COLACE) 100 MG capsule Take 100 mg by mouth 2 (two) times daily.  . fosinopril-hydrochlorothiazide (MONOPRIL-HCT) 10-12.5 MG tablet Take 1 Tablet by mouth once daily  . Multiple Vitamin (MULTIVITAMIN) tablet Take 1 tablet by mouth daily. For 50 Plus  . Probiotic CAPS Take 1 capsule by mouth 2 (two) times daily.  . risperiDONE (RISPERDAL) 1 MG tablet Take by mouth. Takes 1 mg po AM and 2 mg po PM.   No facility-administered encounter medications on file as of 02/08/2017.      Review of Systems  Constitutional: Negative.   HENT: Negative.   Eyes: Negative.   Respiratory: Negative.   Cardiovascular: Negative.   Gastrointestinal: Negative.   Endocrine: Negative.   Genitourinary: Negative.   Musculoskeletal: Negative.  Arthralgias: right lower  arm pain   Skin: Negative.   Allergic/Immunologic: Negative.   Neurological: Negative.   Hematological: Negative.   Psychiatric/Behavioral: Negative.        Objective:   Physical Exam  Constitutional: He is oriented to person, place, and time. He appears well-developed and well-nourished. No distress.  The patient is rigid and stiff especially with the left hand.  He has good mobility in his lower extremities and is using a walker.  HENT:  Head: Normocephalic and atraumatic.  Right Ear: External ear normal.  Left Ear: External ear normal.    Nose: Nose normal.  Mouth/Throat: Oropharynx is clear and moist. No oropharyngeal exudate.  Eyes: Conjunctivae and EOM are normal. Pupils are equal, round, and reactive to light. Right eye exhibits no discharge. Left eye exhibits no discharge. No scleral icterus.  Neck: Normal range of motion. Neck supple. No thyromegaly present.  No bruits thyromegaly or anterior cervical adenopathy  Cardiovascular: Normal rate, regular rhythm, normal heart sounds and intact distal pulses.  No murmur heard. Heart is regular at 72/min slightly decreased pulses in the left upper extremity compared to the right.  Pulmonary/Chest: Effort normal and breath sounds normal. No respiratory distress. He has no wheezes. He has no rales. He exhibits no tenderness.  No axillary adenopathy with good breath sounds bilaterally  Abdominal: Soft. Bowel sounds are normal. He exhibits no mass. There is no tenderness. There is no rebound and no guarding.  No liver or spleen enlargement no bruits no inguinal adenopathy  Genitourinary: Rectum normal and penis normal.  Genitourinary Comments: The prostate is enlarged with an additional firmness at the superior edge of the right side of the prostate.  The external genitalia were within normal limits and.  There were no rectal masses.  Musculoskeletal: He exhibits no edema.  The patient has rigidity in his gait and movements especially with his upper extremities with a somewhat contracture deformity of the hand of the left upper extremity.  Lymphadenopathy:    He has no cervical adenopathy.  Neurological: He is alert and oriented to person, place, and time. He has normal reflexes. No cranial nerve deficit. Coordination abnormal.  Parkinson's type appearance with diminished movement mask face and generalized rigidity especially in the upper extremities.  Skin: Skin is warm and dry. No rash noted.  Psychiatric: He has a normal mood and affect. His behavior is normal. Judgment and  thought content normal.  The patient appears to have full awareness of what is going on and responds to questions appropriately though slowly.  Nursing note and vitals reviewed.   BP 126/83 (BP Location: Left Arm)   Pulse 93   Temp (!) 97.3 F (36.3 C) (Oral)   Ht 6' 1" (1.854 m)   Wt 166 lb (75.3 kg)   BMI 21.90 kg/m        Assessment & Plan:  1. Parkinson disease ([place]) -Continue to follow-up with neurology as planned and continue with current dose of Sinemet - CBC with Differential/Platelet  2. Essential hypertension -Blood pressure is good today and he will continue with current treatment - CBC with Differential/Platelet - BMP8+EGFR - Lipid panel - Hepatic function panel  3. 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 - CBC with Differential/Platelet  4. Muscle weakness -Continue with physical therapy and try to exercise regularly - CBC with Differential/Platelet  5. Unsteadiness on feet -Continue with physical therapy constantly being careful not to fall - CBC with Differential/Platelet  6. Vitamin  D deficiency -Continue with vitamin D replacement pending results of lab work - CBC with Differential/Platelet - VITAMIN D 25 Hydroxy (Vit-D Deficiency, Fractures)  7. Pure hypercholesterolemia -The patient currently is not taking any statin drug as he has been taken off of this over time and we will make sure that it is not necessary to restart this. - CBC with Differential/Platelet  8. Schizophrenia, unspecified type ([place]) -Continue to follow-up with psychiatry, Dr. Terrill Mohr - CBC with Differential/Platelet  9. Benign prostatic hyperplasia, unspecified whether lower urinary tract symptoms present -The prostate gland is enlarged and the patient is having no symptoms with this.  There is a firmer area on top of the right side of the prostate gland there is a different consistency from the remainder of the gland.  We  will wait till the PSA is returned and then decide if he needs referral to urology for this. - CBC with Differential/Platelet - PSA, total and free - Urinalysis, Complete  10. Spinal stenosis of cervical region -We will continue to monitor this. - CBC with Differential/Platelet - Lipid panel  No orders of the defined types were placed in this encounter.  Patient Instructions                       Medicare Annual Wellness Visit  [facility] and the medical providers at [facility] strive to bring you the best medical care.  In doing so we not only want to address your current medical conditions and concerns but also to detect new conditions early and prevent illness, disease and health-related problems.    Medicare offers a yearly Wellness Visit which allows our clinical staff to assess your need for preventative services including immunizations, lifestyle education, counseling to decrease risk of preventable diseases and screening for fall risk and other medical concerns.    This visit is provided free of charge (no copay) for all Medicare recipients. The clinical pharmacists at [facility] have begun to conduct these Wellness Visits which will also include a thorough review of all your medications.    As you primary medical provider recommend that you make an appointment for your Annual Wellness Visit if you have not done so already this year.  You may set up this appointment before you leave today or you may call back ([phone]) and schedule an appointment.  Please make sure when you call that you mention that you are scheduling your Annual Wellness Visit with the clinical pharmacist so that the appointment may be made for the proper length of time.     Continue current medications. Continue good therapeutic lifestyle changes which include good diet and exercise. Fall precautions discussed with patient. If an FOBT was given today- please  return it to our front desk. If you are over 77 years old - you may need Prevnar 60 or the adult Pneumonia vaccine.  **Flu shots are available--- please call and schedule a FLU-CLINIC appointment**  After your visit with [place] today you will receive a survey in the mail or online from [employer] regarding your care with [place]. Please take a moment to fill this out. Your feedback is very important to [place] as you can help [place] better understand your patient needs as well as improve your experience and satisfaction. WE CARE ABOUT YOU!!!   Continue to follow-up with neurology as planned Continue to follow-up with psychiatry as planned Continue to be careful and do not put yourself at  risk for falling Take medication as directed by neurologist Stay as active as possible and drink plenty of water and fluids Continue with physical therapy Patient does have an enlarged prostate gland with a nodule in the superior portion of the gland on the right side. We will get the PSA result back and make a decision about any referral that may be needed at that time.    Arrie Senate MD

## 2017-02-08 NOTE — Patient Instructions (Addendum)
Medicare Annual Wellness Visit  [facility] and the medical providers at [facility] strive to bring you the best medical care.  In doing so we not only want to address your current medical conditions and concerns but also to detect new conditions early and prevent illness, disease and health-related problems.    Medicare offers a yearly Wellness Visit which allows our clinical staff to assess your need for preventative services including immunizations, lifestyle education, counseling to decrease risk of preventable diseases and screening for fall risk and other medical concerns.    This visit is provided free of charge (no copay) for all Medicare recipients. The clinical pharmacists at [facility] have begun to conduct these Wellness Visits which will also include a thorough review of all your medications.    As you primary medical provider recommend that you make an appointment for your Annual Wellness Visit if you have not done so already this year.  You may set up this appointment before you leave today or you may call back ([phone]) and schedule an appointment.  Please make sure when you call that you mention that you are scheduling your Annual Wellness Visit with the clinical pharmacist so that the appointment may be made for the proper length of time.     Continue current medications. Continue good therapeutic lifestyle changes which include good diet and exercise. Fall precautions discussed with patient. If an FOBT was given today- please return it to our front desk. If you are over 56 years old - you may need Prevnar 40 or the adult Pneumonia vaccine.  **Flu shots are available--- please call and schedule a FLU-CLINIC appointment**  After your visit with [place] today you will receive a survey in the mail or online from [employer] regarding your care with [place]. Please take a moment to fill this out. Your feedback is very  important to [place] as you can help [place] better understand your patient needs as well as improve your experience and satisfaction. WE CARE ABOUT YOU!!!   Continue to follow-up with neurology as planned Continue to follow-up with psychiatry as planned Continue to be careful and do not put yourself at risk for falling Take medication as directed by neurologist Stay as active as possible and drink plenty of water and fluids Continue with physical therapy Patient does have an enlarged prostate gland with a nodule in the superior portion of the gland on the right side. We will get the PSA result back and make a decision about any referral that may be needed at that time.

## 2017-02-09 LAB — BMP8+EGFR
BUN / CREAT RATIO: 13 (ref 9–20)
BUN: 9 mg/dL (ref 6–24)
CO2: 28 mmol/L (ref 20–29)
CREATININE: 0.72 mg/dL — AB (ref 0.76–1.27)
Calcium: 9.3 mg/dL (ref 8.7–10.2)
Chloride: 97 mmol/L (ref 96–106)
GFR, EST AFRICAN AMERICAN: 121 mL/min/{1.73_m2} (ref >59)
GFR, EST NON AFRICAN AMERICAN: 104 mL/min/{1.73_m2} (ref >59)
Glucose: 86 mg/dL (ref 65–99)
Potassium: 3.9 mmol/L (ref 3.5–5.2)
SODIUM: 142 mmol/L (ref 134–144)

## 2017-02-09 LAB — LIPID PANEL
CHOL/HDL RATIO: 3.4 ratio (ref 0.0–5.0)
Cholesterol, Total: 162 mg/dL (ref 100–199)
HDL: 48 mg/dL (ref >39)
LDL CALC: 102 mg/dL — AB (ref 0–99)
TRIGLYCERIDES: 59 mg/dL (ref 0–149)
VLDL Cholesterol Cal: 12 mg/dL (ref 5–40)

## 2017-02-09 LAB — CBC WITH DIFFERENTIAL/PLATELET
BASOS ABS: 0.1 10*3/uL (ref 0.0–0.2)
Basos: 1 %
EOS (ABSOLUTE): 0.2 10*3/uL (ref 0.0–0.4)
Eos: 3 %
HEMATOCRIT: 37.8 % (ref 37.5–51.0)
HEMOGLOBIN: 13 g/dL (ref 13.0–17.7)
IMMATURE GRANULOCYTES: 0 %
Immature Grans (Abs): 0 10*3/uL (ref 0.0–0.1)
LYMPHS ABS: 1.5 10*3/uL (ref 0.7–3.1)
Lymphs: 24 %
MCH: 30.7 pg (ref 26.6–33.0)
MCHC: 34.4 g/dL (ref 31.5–35.7)
MCV: 89 fL (ref 79–97)
MONOCYTES: 10 %
MONOS ABS: 0.6 10*3/uL (ref 0.1–0.9)
NEUTROS PCT: 62 %
Neutrophils Absolute: 4 10*3/uL (ref 1.4–7.0)
Platelets: 258 10*3/uL (ref 150–379)
RBC: 4.24 x10E6/uL (ref 4.14–5.80)
RDW: 14.1 % (ref 12.3–15.4)
WBC: 6.4 10*3/uL (ref 3.4–10.8)

## 2017-02-09 LAB — PSA, TOTAL AND FREE
PSA FREE PCT: 4.5 %
PSA, Free: 0.27 ng/mL
Prostate Specific Ag, Serum: 6 ng/mL — ABNORMAL HIGH (ref 0.0–4.0)

## 2017-02-09 LAB — VITAMIN D 25 HYDROXY (VIT D DEFICIENCY, FRACTURES): Vit D, 25-Hydroxy: 63.3 ng/mL (ref 30.0–100.0)

## 2017-02-09 LAB — HEPATIC FUNCTION PANEL
ALT: 7 IU/L (ref 0–44)
AST: 17 IU/L (ref 0–40)
Albumin: 4.5 g/dL (ref 3.5–5.5)
Alkaline Phosphatase: 64 IU/L (ref 39–117)
BILIRUBIN TOTAL: 0.9 mg/dL (ref 0.0–1.2)
BILIRUBIN, DIRECT: 0.22 mg/dL (ref 0.00–0.40)
Total Protein: 6.9 g/dL (ref 6.0–8.5)

## 2017-02-10 ENCOUNTER — Encounter: Payer: Self-pay | Admitting: Family Medicine

## 2017-02-10 DIAGNOSIS — R972 Elevated prostate specific antigen [PSA]: Secondary | ICD-10-CM | POA: Insufficient documentation

## 2017-02-11 ENCOUNTER — Other Ambulatory Visit: Payer: Self-pay | Admitting: *Deleted

## 2017-02-11 DIAGNOSIS — R972 Elevated prostate specific antigen [PSA]: Secondary | ICD-10-CM

## 2017-02-14 ENCOUNTER — Ambulatory Visit: Payer: PPO | Admitting: Physical Therapy

## 2017-02-14 ENCOUNTER — Telehealth: Payer: Self-pay | Admitting: Family Medicine

## 2017-02-14 ENCOUNTER — Encounter: Payer: Self-pay | Admitting: Physical Therapy

## 2017-02-14 DIAGNOSIS — R2681 Unsteadiness on feet: Secondary | ICD-10-CM

## 2017-02-14 DIAGNOSIS — M25622 Stiffness of left elbow, not elsewhere classified: Secondary | ICD-10-CM

## 2017-02-14 DIAGNOSIS — R278 Other lack of coordination: Secondary | ICD-10-CM

## 2017-02-14 DIAGNOSIS — M25612 Stiffness of left shoulder, not elsewhere classified: Secondary | ICD-10-CM

## 2017-02-14 DIAGNOSIS — M6281 Muscle weakness (generalized): Secondary | ICD-10-CM

## 2017-02-14 DIAGNOSIS — R262 Difficulty in walking, not elsewhere classified: Secondary | ICD-10-CM

## 2017-02-14 NOTE — Telephone Encounter (Signed)
Spoke with sister - aware referral was placed - wants at [facility] in [place] if possible  Note taken to referral coordinator.

## 2017-02-14 NOTE — Telephone Encounter (Signed)
She is calling about Referral for Mark Benjamin. She wants in [facility], forensic in [place]. But she really wants to speak with Mark Benjamin about him

## 2017-02-14 NOTE — Therapy (Signed)
[facility] [place], [place], [address] Phone: [phone]   Fax:  [phone]  Physical Therapy Treatment Discharge Summary   Patient Details  Name: Mark Benjamin MRN: [id number] Date of Birth: Oct 16, 1960 Referring Provider: Redge Gainer, MD   Encounter Date: 02/14/2017  PT End of Session - 02/14/17 1313    Visit Number  32    Number of Visits  32    Date for PT Re-Evaluation  03/16/17    Authorization Type  [employer]     Authorization Time Period  01/14/17 to 03/16/17    PT Start Time  1040    PT Stop Time  1120    PT Time Calculation (min)  40 min    Activity Tolerance  Patient tolerated treatment well    Behavior During Therapy  [facility] for tasks assessed/performed       Past Medical History:  Diagnosis Date  . Cataract   . Colon polyps   . Dysphagia   . Essential hypertension, benign   . Fatty liver   . Hyperplasia of prostate   . 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 Other and unspecified hyperlipidemia   . Schizophrenia [facility])     Past Surgical History:  Procedure Laterality Date  . COLONOSCOPY  02/13/08  . COLONOSCOPY N/A 07/10/2012   Procedure: COLONOSCOPY;  Surgeon: Rogene Houston, MD;  Location: [facility];  Service: Endoscopy;  Laterality: N/A;  225  . UPPER GASTROINTESTINAL ENDOSCOPY  EGD ED   09/09/2010  . URETHRAL DILATION  1965    There were no vitals filed for this visit.  Subjective Assessment - 02/14/17 1054    Subjective  Pt arriving to therapy about 10 minutes late. Pt reporting he feels stronger since starting therpay. We dicussed that today would be his discharge.     Pertinent History  Parkinson.  Schizophrenia.    Limitations  Walking;Other (comment)    Patient Stated Goals  get up from chairs better too, get moving around better     Currently in Pain?  No/denies         [facility] PT Assessment - 02/14/17 0001      Assessment   Medical  Diagnosis  Weakness, fall risk    Hand Dominance  Right    Prior Therapy  yes      Precautions   Precautions  Fall      Strength   Left Shoulder Flexion  4/5    Left Shoulder ABduction  4/5    Left Shoulder Internal Rotation  4/5    Left Shoulder External Rotation  4/5    Right Hip Flexion  5/5    Right Hip Extension  5/5    Right Hip ABduction  5/5    Left Hip Flexion  5/5    Left Hip Extension  5/5    Left Hip ABduction  5/5      Ambulation/Gait   Ambulation/Gait  Yes    Ambulation/Gait Assistance  6: Modified independent (Device/Increase time)    Ambulation/Gait Assistance Details  due to increased time, pt doing much better with keeping his walker wheels on the ground    Ambulation Distance (Feet)  710 Feet    Assistive device  Rolling walker    Gait Pattern  Step-through pattern;Decreased stride length;Ataxic;Trunk flexed;Narrow base of support    Ambulation Surface  Level;Indoor    Gait Comments  Pt still with intermittent shuffeling gait  [facility] PT Treatment/Exercise - February 27, 2017 0001      Lumbar Exercises: Aerobic   Stationary Bike  Nustep L6 x 10 minutes      Lumbar Exercises: Seated   Sit to Stand  20 reps      Knee/Hip Exercises: Standing   Other Standing Knee Exercises  Standing with UE support.: marching, hip abduction, x 20 each LE               PT Short Term Goals - 27-Feb-2017 1056      PT SHORT TERM GOAL #1   Baseline  Pt reports he has no difficutly getting in and out of his bed, it just takes a little more time. Pt also reporting he has a pull up bar to assist.         PT Long Term Goals - 27-Feb-2017 1057      PT LONG TERM GOAL #1   Title  Patient to demonstrate MMT as being 5/5 in all tested groups in order to improve balance and general mobility     Period  Weeks    Status  Achieved      PT LONG TERM GOAL #2   Title  Pt will be able to amb > 500 feet with a RW safely keeping all 4 legs on the ground at  all times.     Time  8    Period  Weeks    Status  Achieved            Plan - 02/27/2017 1314    Clinical Impression Statement  Pt is being discharged from Physical Therapy today due to all goals met. Pt still requires intermittent verbal cueing for amb safety using his rolling walker. Pt still presenting with shuffeling gait pattern but is able to amb safely on even surfaces. Pt has improved his L LE strength to grossly 5/5 and his L UE shrength to 4/5 with tone noted. Pt still with limited ROM in L UE but can tolerate AAROM exercises using a cane in sitting and supine. Pt was issued a HEP and instructed in the exercises. Alll therapy goals met. No furhter skilled PT needed at present time.     Rehab Potential  Good    Clinical Impairments Affecting Rehab Potential  (+) motivated to participate in skilled PT services, disease process very addressable by PT    PT Treatment/Interventions  ADLs/Self Care Home Management;Electrical Stimulation;Moist Heat;Patient/family education;Therapeutic exercise;Therapeutic activities;Manual techniques;Passive range of motion;Vasopneumatic Device;Dry needling;Functional mobility training;Gait training;Neuromuscular re-education    PT Next Visit Plan  Pt being discharged from PT today.     PT Home Exercise Plan  Briges, SLR, supine shoulder flexion with cane, sit to stand, seated marching, sit to stand, standing: hip flexion, hip abduction, table slides with L UE    Consulted and Agree with Plan of Care  Patient       Patient will benefit from skilled therapeutic intervention in order to improve the following deficits and impairments:  Abnormal gait, Improper body mechanics, Decreased coordination, Decreased mobility, Postural dysfunction, Decreased range of motion, Decreased strength, Hypomobility, Decreased balance, Difficulty walking, Decreased safety awareness  Visit Diagnosis: Other lack of coordination  Difficulty in walking, not elsewhere  classified  Muscle weakness (generalized)  Unsteadiness on feet  Stiffness of left shoulder, not elsewhere classified  Stiffness of left elbow, not elsewhere classified   G-Codes - 02-27-17 1320    Functional Assessment Tool Used (Outpatient Only)  Based on skilled clinical  assessment of gait, balance, strength, fall risk, Parkinsonian symptoms     Functional Limitation  Mobility: Walking and moving around    Mobility: Walking and Moving Around Current Status [phone])  At least 40 percent but less than 60 percent impaired, limited or restricted    Mobility: Walking and Moving Around Goal Status [phone])  At least 60 percent but less than 80 percent impaired, limited or restricted    Mobility: Walking and Moving Around Discharge Status [phone])  At least 40 percent but less than 60 percent impaired, limited or restricted      PHYSICAL THERAPY DISCHARGE SUMMARY  Visits from Start of Care: 6  Current functional level related to goals / functional outcomes: See above   Remaining deficits: See above    Education / Equipment: See above Plan: Patient agrees to discharge.  Patient goals were met. Patient is being discharged due to meeting the stated rehab goals.  ?????       Problem List Patient Active Problem List   Diagnosis Date Noted  . Elevated PSA 02/10/2017  . Spinal stenosis of cervical region 02/08/2017  . Parkinson disease ([place]) 09/05/2015  . Vitamin D deficiency 09/05/2015  . Metabolic syndrome 64/68/0321  . Hypertension 04/02/2013  . Hyperlipidemia 04/02/2013  . BPH (benign prostatic hyperplasia) 04/02/2013  . Left arm weakness 11/21/2012  . Parkinsonism ([place]) 11/21/2012  . Neck pain 11/21/2012  . Colon adenomas 06/29/2012  . Rectal mass 06/29/2012  . Schizophrenia ([place]) 06/22/2012  . Dysphagia 06/22/2011  . Eosinophilic esophagitis 22/48/2500    Oretha Caprice, MPT 02/14/2017, 1:21 PM  [facility] [place], [place], [address] Phone: [phone]   Fax:  [phone]  Name: Mark Benjamin MRN: [id number] Date of Birth: Jan 23, 1961

## 2017-02-15 ENCOUNTER — Other Ambulatory Visit: Payer: PPO

## 2017-02-15 DIAGNOSIS — Z1212 Encounter for screening for malignant neoplasm of rectum: Secondary | ICD-10-CM

## 2017-02-17 LAB — FECAL OCCULT BLOOD, IMMUNOCHEMICAL: FECAL OCCULT BLD: NEGATIVE

## 2017-02-23 DIAGNOSIS — F29 Unspecified psychosis not due to a substance or known physiological condition: Secondary | ICD-10-CM | POA: Diagnosis not present

## 2017-03-18 ENCOUNTER — Telehealth: Payer: Self-pay | Admitting: Family Medicine

## 2017-03-18 NOTE — Telephone Encounter (Signed)
FYI

## 2017-03-18 NOTE — Telephone Encounter (Signed)
waiting on signature will call when ready.

## 2017-03-21 ENCOUNTER — Encounter (INDEPENDENT_AMBULATORY_CARE_PROVIDER_SITE_OTHER): Payer: Self-pay | Admitting: Internal Medicine

## 2017-04-06 ENCOUNTER — Ambulatory Visit (INDEPENDENT_AMBULATORY_CARE_PROVIDER_SITE_OTHER): Payer: PPO | Admitting: Internal Medicine

## 2017-04-08 ENCOUNTER — Telehealth: Payer: Self-pay | Admitting: Family Medicine

## 2017-04-08 NOTE — Telephone Encounter (Signed)
What kind of supplement - this message can go to pcp back up - once we find out

## 2017-04-13 NOTE — Telephone Encounter (Signed)
Patients sister called office requesting that fax be sent to [facility] stating that when patient finishes chewable cranberry tablets that he be changed to OTC cranberry tabs due to cost. Fax sent to [facility]

## 2017-04-14 DIAGNOSIS — Z0289 Encounter for other administrative examinations: Secondary | ICD-10-CM

## 2017-04-19 ENCOUNTER — Ambulatory Visit: Payer: Self-pay | Admitting: *Deleted

## 2017-04-26 ENCOUNTER — Ambulatory Visit: Payer: Medicare Other | Admitting: *Deleted

## 2017-05-04 ENCOUNTER — Ambulatory Visit (INDEPENDENT_AMBULATORY_CARE_PROVIDER_SITE_OTHER): Payer: Medicare Other | Admitting: *Deleted

## 2017-05-04 ENCOUNTER — Encounter: Payer: Self-pay | Admitting: *Deleted

## 2017-05-04 VITALS — BP 118/79 | HR 87 | Ht 71.0 in | Wt 175.0 lb

## 2017-05-04 DIAGNOSIS — M25632 Stiffness of left wrist, not elsewhere classified: Secondary | ICD-10-CM

## 2017-05-04 DIAGNOSIS — Z0000 Encounter for general adult medical examination without abnormal findings: Secondary | ICD-10-CM | POA: Diagnosis not present

## 2017-05-04 DIAGNOSIS — M25642 Stiffness of left hand, not elsewhere classified: Secondary | ICD-10-CM

## 2017-05-04 DIAGNOSIS — G20 Parkinson's disease: Secondary | ICD-10-CM

## 2017-05-04 NOTE — Patient Instructions (Addendum)
  Mark Benjamin , Thank you for taking time to come for your Medicare Wellness Visit. I appreciate your ongoing commitment to your health goals. Please review the following plan we discussed and let me know if I can assist you in the future.   These are the goals we discussed: Goals    . Exercise 150 min/wk Moderate Activity     Participate in group exercise classes at least 3 days per week        This is a list of the screening recommended for you and due dates:  Health Maintenance  Topic Date Due  .  Hepatitis C: One time screening is recommended by Center for Disease Control  (CDC) for  adults born from [birth year] through 1965.   October 29, 1960  . HIV Screening  07/04/1975  . Tetanus Vaccine  07/20/2016  . Stool Blood Test  02/15/2018  . Flu Shot  Completed   We will order [facility] to provide PT at [facility]. This will be arranged within the next few days.

## 2017-05-04 NOTE — Progress Notes (Addendum)
Health is better than last year   Subjective:   Mark Benjamin is a 57 y.o. male who presents for a subsequent Medicare Annual Wellness Visit. Mark Benjamin lives at [facility], an assisted living facility in [place], [place]. He has never been married and does not have children. His sister is active in his care and provides transportation at times.   Review of Systems  Reports that his health is better than last year.   Cardiac Risk Factors include: dyslipidemia  Musculoskeletal: Sister is concerned about limited ROM in LUE. BLE and gait have improved dramatically.   Other systems negative today.    Objective:    Today's Vitals   05/04/17 1018  BP: 118/79  Pulse: 87  Weight: 175 lb (79.4 kg)  Height: 5\' 11"  (1.803 m)   Body mass index is 24.41 kg/m.  Advance Directives Patient has a healthcare power of attorney on file  Current Medications (verified) Outpatient Encounter Medications as of 05/04/2017  Medication Sig  . benztropine (COGENTIN) 1 MG tablet Take 1 tablet by mouth 2 (two) times daily.  . carbidopa-levodopa (SINEMET IR) 25-100 MG tablet Start half tab three times per day with meals; after 2 weeks, increase to 1 tab three times per day and continue  . Cholecalciferol (VITAMIN D3) 1000 UNITS CAPS Take 3 tablets by mouth daily.   . Cranberry 500 MG CHEW Chew 1 tablet by mouth 2 (two) times daily.  . divalproex (DEPAKOTE) 250 MG DR tablet Take 250 mg by mouth 2 (two) times daily. Take 250mg  BID  . docusate sodium (COLACE) 100 MG capsule Take 100 mg by mouth 2 (two) times daily.  . fosinopril-hydrochlorothiazide (MONOPRIL-HCT) 10-12.5 MG tablet Take 1 Tablet by mouth once daily  . Multiple Vitamin (MULTIVITAMIN) tablet Take 1 tablet by mouth daily. For 50 Plus  . Probiotic CAPS Take 1 capsule by mouth 2 (two) times daily.  . risperiDONE (RISPERDAL) 1 MG tablet Take by mouth. Takes 1 mg po AM and 2 mg po PM.   No facility-administered encounter medications on file as of  05/04/2017.     Allergies (verified) Amoxicillin; Erythromycin; Sulfamethoxazole-trimethoprim; and Zocor [simvastatin]   History: Past Medical History:  Diagnosis Date  . Cataract   . Colon polyps   . Dysphagia   . Essential hypertension, benign   . Fatty liver   . Hyperplasia of prostate   . 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 Other and unspecified hyperlipidemia   . Parkinson disease ([place])   . Schizophrenia [facility])    Past Surgical History:  Procedure Laterality Date  . COLONOSCOPY  02/13/08  . COLONOSCOPY N/A 07/10/2012   Procedure: COLONOSCOPY;  Surgeon: Rogene Houston, MD;  Location[facility];  Service: Endoscopy;  Laterality: N/A;  225  . UPPER GASTROINTESTINAL ENDOSCOPY  EGD ED   09/09/2010  . URETHRAL DILATION  1965   Family History  Problem Relation Age of Onset  . Heart failure Mother   . ALS Father   . Hyperlipidemia Sister   . Inflammatory bowel disease Paternal Grandfather    Social History   Socioeconomic History  . Marital status: Single    Spouse name: None  . Number of children: 0  . Years of education: 12th  . Highest education level: High school graduate  Social Needs  . Financial resource strain: Not hard at all  . Food insecurity - worry: Never true  . Food insecurity - inability: Never true  .  Transportation needs - medical: No  . Transportation needs - non-medical: No  Occupational History    Employer: NOT EMPLOYED  Tobacco Use  . Smoking status: Never Smoker  . Smokeless tobacco: Never Used  Substance and Sexual Activity  . Alcohol use: No    Frequency: Never  . Drug use: No  . Sexual activity: No  Other Topics Concern  . None  Social History Narrative   Patient lives at [facility], which is an assisted living facility. His sister helps with his care. He has never been married and does not have children.    Tobacco Counseling No tobacco use  Clinical Intake:   Pain : No/denies pain      Nutritional Status: BMI of 19-24  Normal Diabetes: No  How often do you need to have someone help you when you read instructions, pamphlets, or other written materials from your doctor or pharmacy?: 1 - Never What is the last grade level you completed in school?: 12  Interpreter Needed?: No  Information entered by :: Chong Sicilian, RN  Activities of Daily Living In your present state of health, do you have any difficulty performing the following activities: 05/04/2017  Hearing? N  Vision? N  Comment Last eye exam was with Dr Rona Ravens in Nov 2017. due for f/u   Difficulty concentrating or making decisions? N  Walking or climbing stairs? Y  Dressing or bathing? Y  Comment Needs assistance at times  Doing errands, shopping? Y  Preparing Food and eating ? Y  Comment Does not prepare own food but is able to feed himself  Using the Toilet? N  In the past six months, have you accidently leaked urine? Y  Comment "every once in a while". Mostly urge incontinence if he waits too long  Do you have problems with loss of bowel control? N  Managing your Medications? Y  Comment Given by staff at assisted living facility  Managing your Finances? N  Housekeeping or managing your Housekeeping? Y  Some recent data might be hidden     Immunizations and Health Maintenance Immunization History  Administered Date(s) Administered  . Influenza Whole 12/20/2009  . Influenza, High Dose Seasonal PF 01/11/2017  . Influenza,inj,Quad PF,6+ Mos 12/23/2014, 01/08/2016  . PPD Test 10/06/2016  . Tdap 07/21/2006   Health Maintenance Due  Topic Date Due  . Hepatitis C Screening  September 17, 1960  . HIV Screening  07/04/1975  . TETANUS/TDAP  07/20/2016    Patient Care Team: Chipper Herb, MD as PCP - General (Family Medicine) Vaughan Basta, Rona Ravens, NP as Nurse Practitioner (Internal Medicine) Cristal Deer, DPM as Consulting Physician (Podiatry) Melina Schools, OD as Consulting Physician (Optometry) Services,  [facility] as Consulting Physician Penni Bombard, MD as Consulting Physician (Neurology)  No hospitalizations, ER visits, or surgeries this past year.      Assessment:   This is a routine wellness examination for [place].  Hearing/Vision screen No deficits noted during visit.   Dietary issues and exercise activities discussed: Current Exercise Habits: Home exercise routine, Type of exercise: stretching;strength training/weights, Time (Minutes): 20, Frequency (Times/Week): 7, Weekly Exercise (Minutes/Week): 140, Intensity: Mild, Exercise limited by: neurologic condition(s)  Goals    . Exercise 150 min/wk Moderate Activity     Participate in group exercise classes at least 3 days per week       Depression Screen PHQ 2/9 Scores 05/04/2017 02/08/2017 12/25/2016 12/01/2016  PHQ - 2 Score 0 4 0 0  PHQ- 9  Score - 15 - -    Fall Risk Fall Risk  05/04/2017 02/08/2017 12/25/2016 12/01/2016 10/06/2016  Falls in the past year? No Yes No Yes Yes  Number falls in past yr: - 1 - 1 1  Injury with Fall? - No - No No  Risk for fall due to : Impaired balance/gait - - - -  Risk for fall due to: Comment parkinsons disease - - - -    Is the patient's home free of loose throw rugs in walkways, pet beds, electrical cords, etc?   yes      Grab bars in the bathroom? yes      Handrails on the stairs?   yes      Adequate lighting?   yes    Cognitive Function: MMSE - Mini Mental State Exam 05/04/2017 04/15/2016 04/14/2015  Orientation to time 5 5 5   Orientation to Place 5 5 5   Registration 3 3 3   Attention/ Calculation 5 5 5   Recall 3 3 2   Language- name 2 objects 2 2 2   Language- repeat 1 1 1   Language- follow 3 step command 3 3 3   Language- read & follow direction 1 1 1   Write a sentence 0 1 0  Write a sentence-comments unable to write due to problem with left hand - -  Copy design 0 1 0  Total score 28 30 27   Normal exam      Screening Tests Health Maintenance  Topic Date Due    . Hepatitis C Screening  June 03, 1960  . HIV Screening  07/04/1975  . TETANUS/TDAP  07/20/2016  . COLON CANCER SCREENING ANNUAL FOBT  02/15/2018  . INFLUENZA VACCINE  Completed    Plan:  Participate in group exercise programs at least 3 days per week Move carefully to avoid falls Home Health PT ordered- see documentation encounter Keep f/u with specialists  I have personally reviewed and noted the following in the patient's chart:   . Medical and social history . Use of alcohol, tobacco or illicit drugs  . Current medications and supplements . Functional ability and status . Nutritional status . Physical activity . Advanced directives . List of other physicians . Hospitalizations, surgeries, and ER visits in previous 12 months . Vitals . Screenings to include cognitive, depression, and falls . Referrals and appointments  In addition, I have reviewed and discussed with patient certain preventive protocols, quality metrics, and best practice recommendations. A written personalized care plan for preventive services as well as general preventive health recommendations were provided to patient.     Chong Sicilian, RN   05/04/2017  I have reviewed and agree with the above AWV documentation.     Arrie Senate MD

## 2017-05-04 NOTE — Progress Notes (Addendum)
Patient was seen for an Annual Wellness Visit today and had complaints of limited mobility in left arm, hand, wrist. He is ambidextrous but does write better with his left hand. He benefited from physical therapy for gait abnormality and weakness recently and he would like to focus physical therapy on his left arm/hand this time.   Physical exam shows that at rest his left index finger is extended while all others are flexed. He has limited range of motion and is not able to hold a pen or fold a piece of paper properly. Consulted with Dr Laurance Flatten who came in and spoke with the patient as well as performed a physical exam for muscle strength. Strength was equal bilaterally in upper and lower extremities.   Due to limited range of motion, Dr Laurance Flatten felt that the patient would also benefit from physical therapy that is focused on his left upper extremity.  Order for home PT through [facility] (pt's request) placed. He will have this done at [facility], where he is a resident.    Mark Sicilian, RN  I have reviewed and agree with the above AWV documentation.   Arrie Senate MD

## 2017-05-12 ENCOUNTER — Ambulatory Visit (INDEPENDENT_AMBULATORY_CARE_PROVIDER_SITE_OTHER): Payer: PPO | Admitting: Internal Medicine

## 2017-05-23 ENCOUNTER — Telehealth: Payer: Self-pay | Admitting: Family Medicine

## 2017-05-23 NOTE — Telephone Encounter (Signed)
[name] calling to find out if PT has been ordered for the patient and also to get a copy of his lab results from his visit with Dr. Laurance Flatten in November.  I informed her according to Kristin's note at his AWV she has ordered PT.  Also, I placed a copy of his labs at the front for pick up.

## 2017-05-24 NOTE — Telephone Encounter (Signed)
Put up front for patient to pick up.  

## 2017-06-01 ENCOUNTER — Ambulatory Visit (INDEPENDENT_AMBULATORY_CARE_PROVIDER_SITE_OTHER): Payer: Medicare Other | Admitting: Urology

## 2017-06-01 DIAGNOSIS — R972 Elevated prostate specific antigen [PSA]: Secondary | ICD-10-CM

## 2017-06-20 ENCOUNTER — Ambulatory Visit (INDEPENDENT_AMBULATORY_CARE_PROVIDER_SITE_OTHER): Payer: PPO | Admitting: Internal Medicine

## 2017-06-29 ENCOUNTER — Ambulatory Visit (INDEPENDENT_AMBULATORY_CARE_PROVIDER_SITE_OTHER): Payer: PPO | Admitting: Internal Medicine

## 2017-06-29 DIAGNOSIS — H2513 Age-related nuclear cataract, bilateral: Secondary | ICD-10-CM | POA: Diagnosis not present

## 2017-06-29 DIAGNOSIS — H31001 Unspecified chorioretinal scars, right eye: Secondary | ICD-10-CM | POA: Diagnosis not present

## 2017-06-29 DIAGNOSIS — E113293 Type 2 diabetes mellitus with mild nonproliferative diabetic retinopathy without macular edema, bilateral: Secondary | ICD-10-CM | POA: Diagnosis not present

## 2017-06-29 DIAGNOSIS — H04123 Dry eye syndrome of bilateral lacrimal glands: Secondary | ICD-10-CM | POA: Diagnosis not present

## 2017-06-29 LAB — HM DIABETES EYE EXAM

## 2017-07-06 DIAGNOSIS — M6281 Muscle weakness (generalized): Secondary | ICD-10-CM | POA: Diagnosis not present

## 2017-07-06 DIAGNOSIS — F209 Schizophrenia, unspecified: Secondary | ICD-10-CM | POA: Diagnosis not present

## 2017-07-06 DIAGNOSIS — I10 Essential (primary) hypertension: Secondary | ICD-10-CM | POA: Diagnosis not present

## 2017-07-06 DIAGNOSIS — M4802 Spinal stenosis, cervical region: Secondary | ICD-10-CM | POA: Diagnosis not present

## 2017-07-06 DIAGNOSIS — G20 Parkinson's disease: Secondary | ICD-10-CM | POA: Diagnosis not present

## 2017-07-11 DIAGNOSIS — M6281 Muscle weakness (generalized): Secondary | ICD-10-CM | POA: Diagnosis not present

## 2017-07-11 DIAGNOSIS — I10 Essential (primary) hypertension: Secondary | ICD-10-CM | POA: Diagnosis not present

## 2017-07-11 DIAGNOSIS — G20 Parkinson's disease: Secondary | ICD-10-CM | POA: Diagnosis not present

## 2017-07-11 DIAGNOSIS — M4802 Spinal stenosis, cervical region: Secondary | ICD-10-CM | POA: Diagnosis not present

## 2017-07-11 DIAGNOSIS — F209 Schizophrenia, unspecified: Secondary | ICD-10-CM | POA: Diagnosis not present

## 2017-07-13 ENCOUNTER — Ambulatory Visit: Payer: Medicare Other | Admitting: Urology

## 2017-07-13 ENCOUNTER — Ambulatory Visit (INDEPENDENT_AMBULATORY_CARE_PROVIDER_SITE_OTHER): Payer: Medicare Other | Admitting: Internal Medicine

## 2017-07-13 ENCOUNTER — Encounter (INDEPENDENT_AMBULATORY_CARE_PROVIDER_SITE_OTHER): Payer: Self-pay | Admitting: Internal Medicine

## 2017-07-13 VITALS — BP 140/82 | HR 72 | Temp 98.3°F | Ht 73.0 in | Wt 176.7 lb

## 2017-07-13 DIAGNOSIS — K601 Chronic anal fissure: Secondary | ICD-10-CM | POA: Diagnosis not present

## 2017-07-13 NOTE — Patient Instructions (Signed)
Continue stool softener. Increase fiber in diet.

## 2017-07-13 NOTE — Progress Notes (Signed)
Subjective:    Patient ID: Mark Benjamin, male    DOB: 1960/05/01, 57 y.o.   MRN: [id number] Resident of [facility] Here today for f/u. Last seen In January of 2018.  Hx of rectal fissure and has used Cardizem 2% as needed.  He states he has a BM every 2-3 days. Stools are not hard. Takes a stool softener BID. Occasionally has some rectal pain.  He does see blood occasionally see blood. His appetite is good. He has lost from 200 lbs in January of 2018 to 178 today.     Procedure:07/10/2012 Colonoscopy  Indications: Patient is 57 year old Caucasian male who was noted to have abnormal exam by Dr. Redge Gainer. He gives history of occasional hematochezia when he is constipated. His last colonoscopy was about 5 years ago with removal of single small polyp.  history is negative for colorectal carcinoma. Examination performed to cecum. Small cecal polyp ablated via cold biopsy. Two small diverticula at sigmoid colon. Small external hemorrhoids and 2 anal papillae but no evidence of rectal polyp or a mass Rehman, MD on 07/18/2012 at 3:03 PM Cecal polyp is nonadenomatous. Results reviewed with patient.    Review of Systems Past Medical History:  Diagnosis Date  . Cataract   . Colon polyps   . Dysphagia   . Essential hypertension, benign   . Fatty liver   . Hyperplasia of prostate   . 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 Other and unspecified hyperlipidemia   . Parkinson disease ([place])   . Schizophrenia [facility])     Past Surgical History:  Procedure Laterality Date  . COLONOSCOPY  02/13/08  . COLONOSCOPY N/A 07/10/2012   Procedure: COLONOSCOPY;  Surgeon: Rogene Houston, MD;  [facility];  Service: Endoscopy;  Laterality: N/A;  225  . UPPER GASTROINTESTINAL ENDOSCOPY  EGD ED   09/09/2010  . URETHRAL DILATION  1965    Allergies  Allergen Reactions  . Amoxicillin     Dizzy and blurred vision  . Erythromycin   .  Sulfamethoxazole-Trimethoprim Other (See Comments)    Extreme weakness/lethorgy  . Zocor [Simvastatin]     Current Outpatient Medications on File Prior to Visit  Medication Sig Dispense Refill  . benztropine (COGENTIN) 1 MG tablet Take 1 tablet by mouth 2 (two) times daily.    . carbidopa-levodopa (SINEMET IR) 25-100 MG tablet Start half tab three times per day with meals; after 2 weeks, increase to 1 tab three times per day and continue 90 tablet 6  . Cholecalciferol (VITAMIN D3) 1000 UNITS CAPS Take 3 tablets by mouth daily.     . Cranberry (AZO CRANBERRY GUMMIES) 500 MG CHEW Chew by mouth.    . divalproex (DEPAKOTE) 250 MG DR tablet Take 250 mg by mouth 2 (two) times daily. Take 250mg  BID    . docusate sodium (COLACE) 100 MG capsule Take 100 mg by mouth 2 (two) times daily.    . fosinopril-hydrochlorothiazide (MONOPRIL-HCT) 10-12.5 MG tablet Take 1 Tablet by mouth once daily 90 tablet 1  . Multiple Vitamin (MULTIVITAMIN) tablet Take 1 tablet by mouth daily. For 50 Plus    . Probiotic CAPS Take 1 capsule by mouth 2 (two) times daily. 60 capsule 5  . risperiDONE (RISPERDAL) 1 MG tablet Take by mouth. Takes 1 mg po AM and 2 mg po PM.    . tamsulosin (FLOMAX) 0.4 MG CAPS capsule Take 0.4 mg by mouth.    Marland Kitchen  Cranberry 500 MG CHEW Chew 1 tablet by mouth 2 (two) times daily. 60 tablet 0   No current facility-administered medications on file prior to visit.         Objective:   Physical Exam Blood pressure 140/82, pulse 72, temperature 98.3 F (36.8 C), height 6\' 1"  (1.854 m), weight 176 lb 11.2 oz (80.2 kg). Alert and oriented. Skin warm and dry. Oral mucosa is moist.   . Sclera anicteric, conjunctivae is pink. Thyroid not enlarged. No cervical lymphadenopathy. Lungs clear. Heart regular rate and rhythm.  Abdomen is soft. Bowel sounds are positive. No hepatomegaly. No abdominal masses felt. No tenderness.  No edema to lower extremities.          Assessment & Plan:  Rectal fissure. He  seems to be doing well. He will continue the stool softeners. Would like him to increase fiber in his diet. OV in 1 year.

## 2017-07-15 DIAGNOSIS — F209 Schizophrenia, unspecified: Secondary | ICD-10-CM | POA: Diagnosis not present

## 2017-07-15 DIAGNOSIS — I10 Essential (primary) hypertension: Secondary | ICD-10-CM | POA: Diagnosis not present

## 2017-07-15 DIAGNOSIS — G20 Parkinson's disease: Secondary | ICD-10-CM | POA: Diagnosis not present

## 2017-07-15 DIAGNOSIS — M6281 Muscle weakness (generalized): Secondary | ICD-10-CM | POA: Diagnosis not present

## 2017-07-15 DIAGNOSIS — M4802 Spinal stenosis, cervical region: Secondary | ICD-10-CM | POA: Diagnosis not present

## 2017-07-20 DIAGNOSIS — I10 Essential (primary) hypertension: Secondary | ICD-10-CM | POA: Diagnosis not present

## 2017-07-20 DIAGNOSIS — G20 Parkinson's disease: Secondary | ICD-10-CM | POA: Diagnosis not present

## 2017-07-20 DIAGNOSIS — M4802 Spinal stenosis, cervical region: Secondary | ICD-10-CM | POA: Diagnosis not present

## 2017-07-20 DIAGNOSIS — F209 Schizophrenia, unspecified: Secondary | ICD-10-CM | POA: Diagnosis not present

## 2017-07-20 DIAGNOSIS — M6281 Muscle weakness (generalized): Secondary | ICD-10-CM | POA: Diagnosis not present

## 2017-07-21 ENCOUNTER — Ambulatory Visit (INDEPENDENT_AMBULATORY_CARE_PROVIDER_SITE_OTHER): Payer: Medicare Other

## 2017-07-21 DIAGNOSIS — I10 Essential (primary) hypertension: Secondary | ICD-10-CM | POA: Diagnosis not present

## 2017-07-21 DIAGNOSIS — F209 Schizophrenia, unspecified: Secondary | ICD-10-CM

## 2017-07-21 DIAGNOSIS — M6281 Muscle weakness (generalized): Secondary | ICD-10-CM

## 2017-07-21 DIAGNOSIS — G20 Parkinson's disease: Secondary | ICD-10-CM

## 2017-07-21 DIAGNOSIS — M4802 Spinal stenosis, cervical region: Secondary | ICD-10-CM | POA: Diagnosis not present

## 2017-07-22 DIAGNOSIS — M4802 Spinal stenosis, cervical region: Secondary | ICD-10-CM | POA: Diagnosis not present

## 2017-07-22 DIAGNOSIS — F209 Schizophrenia, unspecified: Secondary | ICD-10-CM | POA: Diagnosis not present

## 2017-07-22 DIAGNOSIS — G20 Parkinson's disease: Secondary | ICD-10-CM | POA: Diagnosis not present

## 2017-07-22 DIAGNOSIS — M6281 Muscle weakness (generalized): Secondary | ICD-10-CM | POA: Diagnosis not present

## 2017-07-22 DIAGNOSIS — I10 Essential (primary) hypertension: Secondary | ICD-10-CM | POA: Diagnosis not present

## 2017-07-25 DIAGNOSIS — M4802 Spinal stenosis, cervical region: Secondary | ICD-10-CM | POA: Diagnosis not present

## 2017-07-25 DIAGNOSIS — F209 Schizophrenia, unspecified: Secondary | ICD-10-CM | POA: Diagnosis not present

## 2017-07-25 DIAGNOSIS — M6281 Muscle weakness (generalized): Secondary | ICD-10-CM | POA: Diagnosis not present

## 2017-07-25 DIAGNOSIS — G20 Parkinson's disease: Secondary | ICD-10-CM | POA: Diagnosis not present

## 2017-07-25 DIAGNOSIS — I10 Essential (primary) hypertension: Secondary | ICD-10-CM | POA: Diagnosis not present

## 2017-07-26 DIAGNOSIS — I10 Essential (primary) hypertension: Secondary | ICD-10-CM | POA: Diagnosis not present

## 2017-07-26 DIAGNOSIS — M4802 Spinal stenosis, cervical region: Secondary | ICD-10-CM | POA: Diagnosis not present

## 2017-07-26 DIAGNOSIS — G20 Parkinson's disease: Secondary | ICD-10-CM | POA: Diagnosis not present

## 2017-07-26 DIAGNOSIS — F209 Schizophrenia, unspecified: Secondary | ICD-10-CM | POA: Diagnosis not present

## 2017-07-26 DIAGNOSIS — M6281 Muscle weakness (generalized): Secondary | ICD-10-CM | POA: Diagnosis not present

## 2017-07-28 DIAGNOSIS — I10 Essential (primary) hypertension: Secondary | ICD-10-CM | POA: Diagnosis not present

## 2017-07-28 DIAGNOSIS — F209 Schizophrenia, unspecified: Secondary | ICD-10-CM | POA: Diagnosis not present

## 2017-07-28 DIAGNOSIS — G20 Parkinson's disease: Secondary | ICD-10-CM | POA: Diagnosis not present

## 2017-07-28 DIAGNOSIS — M4802 Spinal stenosis, cervical region: Secondary | ICD-10-CM | POA: Diagnosis not present

## 2017-07-28 DIAGNOSIS — M6281 Muscle weakness (generalized): Secondary | ICD-10-CM | POA: Diagnosis not present

## 2017-07-29 ENCOUNTER — Ambulatory Visit (INDEPENDENT_AMBULATORY_CARE_PROVIDER_SITE_OTHER): Payer: Medicare Other | Admitting: Urology

## 2017-07-29 DIAGNOSIS — R3914 Feeling of incomplete bladder emptying: Secondary | ICD-10-CM

## 2017-07-29 DIAGNOSIS — G20 Parkinson's disease: Secondary | ICD-10-CM | POA: Diagnosis not present

## 2017-07-29 DIAGNOSIS — M6281 Muscle weakness (generalized): Secondary | ICD-10-CM | POA: Diagnosis not present

## 2017-07-29 DIAGNOSIS — N401 Enlarged prostate with lower urinary tract symptoms: Secondary | ICD-10-CM | POA: Diagnosis not present

## 2017-07-29 DIAGNOSIS — F209 Schizophrenia, unspecified: Secondary | ICD-10-CM | POA: Diagnosis not present

## 2017-07-29 DIAGNOSIS — M4802 Spinal stenosis, cervical region: Secondary | ICD-10-CM | POA: Diagnosis not present

## 2017-07-29 DIAGNOSIS — I10 Essential (primary) hypertension: Secondary | ICD-10-CM | POA: Diagnosis not present

## 2017-08-01 ENCOUNTER — Ambulatory Visit (INDEPENDENT_AMBULATORY_CARE_PROVIDER_SITE_OTHER): Payer: Medicare Other | Admitting: Diagnostic Neuroimaging

## 2017-08-01 ENCOUNTER — Encounter: Payer: Self-pay | Admitting: Diagnostic Neuroimaging

## 2017-08-01 VITALS — BP 136/83 | HR 91 | Ht 73.0 in | Wt 178.6 lb

## 2017-08-01 DIAGNOSIS — G20 Parkinson's disease: Secondary | ICD-10-CM

## 2017-08-01 DIAGNOSIS — M4802 Spinal stenosis, cervical region: Secondary | ICD-10-CM

## 2017-08-01 DIAGNOSIS — M6281 Muscle weakness (generalized): Secondary | ICD-10-CM | POA: Diagnosis not present

## 2017-08-01 DIAGNOSIS — I10 Essential (primary) hypertension: Secondary | ICD-10-CM | POA: Diagnosis not present

## 2017-08-01 DIAGNOSIS — F209 Schizophrenia, unspecified: Secondary | ICD-10-CM | POA: Diagnosis not present

## 2017-08-01 MED ORDER — CARBIDOPA-LEVODOPA 25-100 MG PO TABS: 1.0000 | ORAL_TABLET | Freq: Four times a day (QID) | ORAL | 12 refills | Status: DC

## 2017-08-01 NOTE — Progress Notes (Signed)
[facility]  PATIENT: Mark Benjamin DOB: 12-14-1960  REFERRING CLINICIAN: Alphonse Guild HISTORY FROM: patient and sister REASON FOR VISIT: follow up   HISTORICAL  CHIEF COMPLAINT:  Chief Complaint  Patient presents with  . Follow-up  . Parkinsons Disease    doing much better, No falls, still getting PT/OT 2 times a week.  Sometimes has leg weakness.  resides at [place].  Sister with pt.  Has elevated PSI and being followed at [facility] urology.     HISTORY OF PRESENT ILLNESS:   UPDATE (08/01/17, VRP): Since last visit, doing well, with improve gait and walking. Tremor stable. Tolerating meds. No alleviating or aggravating factors. No delusions / hallucinations.  UPDATE (01/31/17, VRP): Since last visit, doing well. Tolerating carb/levo. No alleviating or aggravating factors.   UPDATE 09/29/16: Since last visit, tremors and coordination have worsened. Balance poor. Had a minor fall 3 weeks ago. Sister is looking at options for PACE vs assisted living. She is concerned about his safety to live alone. He is no longer driving. They were not able to afford [facility] ($14k out of pocket).   UPDATE 06/15/16: Since last visit, tremor, slowness, stiffness continue. Here to look at other options for treatment. Schizophrenia stable. More balance and coordination issues. Patient lives alone. Driving short distances.   UPDATE 09/22/15 (VRP): Since last visit, tremor and parkinsonism sxs continue. Saw Dr. Olga Millers, but was told that his spine issues are not amenable to surgical treatment.   UPDATE 06/04/14 (VRP): Since last visit, patient has transitioned from thioridazine to risperdal and cogentin and depakote. Left arm symptoms are stable / slightly improved. Patient not interested in surgical treatment of c-spine surg; also, was told by neurosurg that his neck issues do not need surg decompression.  UPDATE 08/10/13 (LL): Patient states that he was evaluated by Dr. Vertell Limber for  cervical stenosis and was told that there was nothing seen on Myelogram that needed surgery. His Thioridazine dose is now 100 mg. Symptoms are stable. He was advised by PCP to cut out artificial sweeteners to see if that changed his symptoms; he did so 2 weeks ago. He complains of mild left leg weakness.  UPDATE 02/26/13 (VRP): Since last visit patient had MRI of the brain and cervical spine which shows moderate spinal stenosis at C5-6 level with severe bilateral foraminal stenosis. Also mild spinal stenosis and moderate right foraminal stenosis at C6-7. No cord signal abnormalities. Since last visit thioridazine dosing has been reduced. Overall his symptoms are stable to slightly progressed.  PRIOR HPI (11/21/12, VRP): 57 year old left-handed male with history of diabetes, hypercholesterolemia, schizophrenia, on thioridazine since 1986, here for evaluation of left arm weakness and numbness since March 2014. Patient reports gradual onset, progressive numbness and weakness and slowness of his left arm. He denies any symptoms in his right arm or legs. No facial droop or facial numbness. He has noticed some slurred speech over the past one week. Patient is left-handed and has noticed difficulty with brushing his teeth with his left hand, handwriting and other fine movements. Patient's psychiatrist reduced thioridazine dose from 300 mg at bedtime down to 200 mg at bedtime last month, out of consideration of the patient's presenting symptoms as a potential side effect of medication. Since reducing the dose, no change in symptoms.   REVIEW OF SYSTEMS: Full 14 system review of systems performed and negative: dizziness speech diff tremors decr concentration incontinence.    ALLERGIES: Allergies  Allergen Reactions  . Amoxicillin  Dizzy and blurred vision  . Erythromycin   . Sulfamethoxazole-Trimethoprim Other (See Comments)    Extreme weakness/lethorgy  . Zocor [Simvastatin]     HOME  MEDICATIONS: Outpatient Medications Prior to Visit  Medication Sig Dispense Refill  . benztropine (COGENTIN) 1 MG tablet Take 1 tablet by mouth 2 (two) times daily.    . carbidopa-levodopa (SINEMET IR) 25-100 MG tablet Start half tab three times per day with meals; after 2 weeks, increase to 1 tab three times per day and continue (Patient taking differently: 1 tablet 3 (three) times daily. Start half tab three times per day with meals; after 2 weeks, increase to 1 tab three times per day and continue) 90 tablet 6  . Cholecalciferol (VITAMIN D3) 1000 UNITS CAPS Take 3 tablets by mouth daily.     . Cranberry (AZO CRANBERRY GUMMIES) 500 MG CHEW Chew by mouth. Takes one daily    . divalproex (DEPAKOTE) 250 MG DR tablet Take 250 mg by mouth 2 (two) times daily. Take 250mg  BID    . docusate sodium (COLACE) 100 MG capsule Take 100 mg by mouth daily.     . fosinopril-hydrochlorothiazide (MONOPRIL-HCT) 10-12.5 MG tablet Take 1 Tablet by mouth once daily 90 tablet 1  . Multiple Vitamin (MULTIVITAMIN) tablet Take 1 tablet by mouth daily. For 50 Plus    . Probiotic CAPS Take 1 capsule by mouth 2 (two) times daily. 60 capsule 5  . risperiDONE (RISPERDAL) 1 MG tablet Take by mouth. Takes 1 mg po AM and 2 mg po PM.    . tamsulosin (FLOMAX) 0.4 MG CAPS capsule Take 0.4 mg by mouth daily.     . Cranberry 500 MG CHEW Chew 1 tablet by mouth 2 (two) times daily. 60 tablet 0   No facility-administered medications prior to visit.     PAST MEDICAL HISTORY: Past Medical History:  Diagnosis Date  . Cataract   . Colon polyps   . Dysphagia   . Essential hypertension, benign   . Fatty liver   . Hyperplasia of prostate   . 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 Other and unspecified hyperlipidemia   . Parkinson disease ([place])   . Schizophrenia ([place])     PAST SURGICAL HISTORY: Past Surgical History:  Procedure Laterality Date  . COLONOSCOPY  02/13/08  . COLONOSCOPY N/A 07/10/2012    Procedure: COLONOSCOPY;  Surgeon: Rogene Houston, MD;  Location: [facility];  Service: Endoscopy;  Laterality: N/A;  225  . UPPER GASTROINTESTINAL ENDOSCOPY  [facility] ED   09/09/2010  . URETHRAL DILATION  1965    FAMILY HISTORY: Family History  Problem Relation Age of Onset  . Heart failure Mother   . ALS Father   . Hyperlipidemia Sister   . Inflammatory bowel disease Paternal Grandfather     SOCIAL HISTORY:  Social History   Socioeconomic History  . Marital status: Single    Spouse name: Not on file  . Number of children: 0  . Years of education: 12th  . Highest education level: High school graduate  Occupational History    Employer: NOT EMPLOYED  Social Needs  . Financial resource strain: Not hard at all  . Food insecurity:    Worry: Never true    Inability: Never true  . Transportation needs:    Medical: No    Non-medical: No  Tobacco Use  . Smoking status: Never Smoker  . Smokeless tobacco: Never Used  Substance and Sexual Activity  .  Alcohol use: No    Frequency: Never  . Drug use: No  . Sexual activity: Never  Lifestyle  . Physical activity:    Days per week: 7 days    Minutes per session: 30 min  . Stress: To some extent  Relationships  . Social connections:    Talks on phone: More than three times a week    Gets together: More than three times a week    Attends religious service: More than 4 times per year    Active member of club or organization: No    Attends meetings of clubs or organizations: Never    Relationship status: Never married  . Intimate partner violence:    Fear of current or ex partner: Not on file    Emotionally abused: Not on file    Physically abused: Not on file    Forced sexual activity: Not on file  Other Topics Concern  . Not on file  Social History Narrative   Patient [facility] at [facility], which is an assisted living facility. His sister helps with his care. He has never been married and does not have children.       PHYSICAL EXAM  Vitals:   08/01/17 1547  BP: 136/83  Pulse: 91  Weight: 178 lb 9.6 oz (81 kg)  Height: 6\' 1"  (1.854 m)    Not recorded      Body mass index is 23.56 kg/m.  GENERAL EXAM: Patient is in no distress  CARDIOVASCULAR: Regular rate and rhythm, no murmurs, no carotid bruits  NEUROLOGIC: MENTAL STATUS: awake, alert, language fluent, comprehension intact, naming intact; SEVERE MASKED FACIES. SEVERE PAUCITY OF SPEECH OUTPUT CRANIAL NERVE: pupils equal and reactive to light, visual fields full to confrontation, extraocular muscles intact, no nystagmus, facial sensation and strength symmetric, uvula midline, shoulder shrug symmetric, tongue midline. MOTOR: INCREASED TONE IN LUE WITH RARE, MINOR REST TREMOR IN LUE. SIGNIFICANT BRADYKINESIA IN LUE. DYSTONIC POSTURE OF LEFT HAND. RUE AND BLE 5. LUE FINGER ABDUCTION AND EXTENSION 3.  SENSORY: normal and symmetric to light touch COORDINATION: finger-nose-finger, fine finger movements normal REFLEXES: RUE 3, LUE 2, KNEES 2, ANKLES 2 GAIT/STATION: narrow based gait; SMOOTH GAIT; DECR ARM SWING.    DIAGNOSTIC DATA (LABS, IMAGING, TESTING) - I reviewed patient records, labs, notes, testing and imaging myself where available.  Lab Results  Component Value Date   WBC 6.4 02/08/2017   HGB 13.0 02/08/2017   HCT 37.8 02/08/2017   MCV 89 02/08/2017   PLT 258 02/08/2017      Component Value Date/Time   NA 142 02/08/2017 1436   K 3.9 02/08/2017 1436   CL 97 02/08/2017 1436   CO2 28 02/08/2017 1436   GLUCOSE 86 02/08/2017 1436   GLUCOSE 94 04/24/2013 0928   BUN 9 02/08/2017 1436   CREATININE 0.72 (L) 02/08/2017 1436   CREATININE 0.86 04/24/2013 0928   CALCIUM 9.3 02/08/2017 1436   PROT 6.9 02/08/2017 1436   ALBUMIN 4.5 02/08/2017 1436   AST 17 02/08/2017 1436   ALT 7 02/08/2017 1436   ALKPHOS 64 02/08/2017 1436   BILITOT 0.9 02/08/2017 1436   GFRNONAA 104 02/08/2017 1436   GFRNONAA >89 04/24/2013 0928   GFRAA  121 02/08/2017 1436   GFRAA >89 04/24/2013 0928   Lab Results  Component Value Date   CHOL 162 02/08/2017   HDL 48 02/08/2017   LDLCALC 102 (H) 02/08/2017   TRIG 59 02/08/2017   CHOLHDL 3.4 02/08/2017   Lab  Results  Component Value Date   HGBA1C 4.8 11/08/2013   Lab Results  Component Value Date   [id number] 812 06/03/2016   Lab Results  Component Value Date   TSH 1.310 06/03/2016    11/29/12 MRI cervical  1. At C5-6: disc bulging with moderate spinal stenosis and severe biforaminal stenosis  2. At C3-4, C4-5, C6-7: disc bulging with mild spinal stenosis and severe biforaminal stenosis  3. No cord signal abnormalities.  11/29/12 MRI brain (without) demonstrating:  1. Single right frontal subcortical focus (90mm) of non-specific gliosis.  2. No acute findings.  04/12/13 CT myelogram cervical spine - Central disc herniation at C3-4 with moderate central stenosis. Severe left foraminal narrowing at this level. Moderate central stenosis at C5-6 and C6-7 secondary to posterior disc osteophytes.  06/05/15 MRI cervical spine [I reviewed images myself and agree with interpretation. -VRP]  1. At C3-4 there is a mild broad-based disc bulge. Left uncovertebral degenerative change and left facet arthropathy resulting in severe left foraminal stenosis. Mild right foraminal stenosis. Mild spinal stenosis. 2. Cervical spine spondylosis as described above.     ASSESSMENT AND PLAN  57 y.o. year old male here with gradual onset, progressive left arm weakness, slowness and stiffness with some numbness. On exam, patient has masked facies, rigidity in the left arm, bradykinesia in the left arm, stooped posture and poor arm swing. Ordered [facility] to eval for parkinson's disease vs drug-induced parkinsonism --> but not affordable at this time   Dx: parkinsonism (likely idiopathic parkinson's disease; drug induced less likely as symptoms have continued to progress in spite of reducing  anti-psychotic medications) + mild cervical spinal stenosis and cervical radiculopathy  Parkinson's disease ([place])  Spinal stenosis in cervical region    PLAN:  PARKINSON'S DISEASE (established problem, worsening) - increase carb/levo to 1 tab four times a day (6am, 11am, 4pm, 8pm) - continue physical activity / exercises / PT - continue to use lowest possible dose of anti-psychotic medication (Dr. Letitia Caul; psychiatry)  Meds ordered this encounter  Medications  . carbidopa-levodopa (SINEMET IR) 25-100 MG tablet    Sig: Take 1 tablet by mouth 4 (four) times daily. (take at 6am, 11am, 4pm, 8pm)    Dispense:  120 tablet    Refill:  12   Return in about 9 months (around 05/04/2018).    Penni Bombard, MD 7/51/7001, 7:49 PM Certified in Neurology, Neurophysiology and Neuroimaging  [facility] [address] [place], [place] [address] [phone]

## 2017-08-02 DIAGNOSIS — M4802 Spinal stenosis, cervical region: Secondary | ICD-10-CM | POA: Diagnosis not present

## 2017-08-02 DIAGNOSIS — G20 Parkinson's disease: Secondary | ICD-10-CM | POA: Diagnosis not present

## 2017-08-02 DIAGNOSIS — I10 Essential (primary) hypertension: Secondary | ICD-10-CM | POA: Diagnosis not present

## 2017-08-02 DIAGNOSIS — M6281 Muscle weakness (generalized): Secondary | ICD-10-CM | POA: Diagnosis not present

## 2017-08-02 DIAGNOSIS — F209 Schizophrenia, unspecified: Secondary | ICD-10-CM | POA: Diagnosis not present

## 2017-08-03 DIAGNOSIS — M4802 Spinal stenosis, cervical region: Secondary | ICD-10-CM | POA: Diagnosis not present

## 2017-08-03 DIAGNOSIS — G20 Parkinson's disease: Secondary | ICD-10-CM | POA: Diagnosis not present

## 2017-08-03 DIAGNOSIS — F209 Schizophrenia, unspecified: Secondary | ICD-10-CM | POA: Diagnosis not present

## 2017-08-03 DIAGNOSIS — M6281 Muscle weakness (generalized): Secondary | ICD-10-CM | POA: Diagnosis not present

## 2017-08-03 DIAGNOSIS — I10 Essential (primary) hypertension: Secondary | ICD-10-CM | POA: Diagnosis not present

## 2017-08-04 DIAGNOSIS — G20 Parkinson's disease: Secondary | ICD-10-CM | POA: Diagnosis not present

## 2017-08-04 DIAGNOSIS — I10 Essential (primary) hypertension: Secondary | ICD-10-CM | POA: Diagnosis not present

## 2017-08-04 DIAGNOSIS — M4802 Spinal stenosis, cervical region: Secondary | ICD-10-CM | POA: Diagnosis not present

## 2017-08-04 DIAGNOSIS — F209 Schizophrenia, unspecified: Secondary | ICD-10-CM | POA: Diagnosis not present

## 2017-08-04 DIAGNOSIS — M6281 Muscle weakness (generalized): Secondary | ICD-10-CM | POA: Diagnosis not present

## 2017-08-08 ENCOUNTER — Encounter: Payer: Self-pay | Admitting: Neurology

## 2017-08-08 ENCOUNTER — Telehealth: Payer: Self-pay | Admitting: Neurology

## 2017-08-08 ENCOUNTER — Telehealth: Payer: Self-pay | Admitting: Diagnostic Neuroimaging

## 2017-08-08 DIAGNOSIS — M4802 Spinal stenosis, cervical region: Secondary | ICD-10-CM | POA: Diagnosis not present

## 2017-08-08 DIAGNOSIS — M6281 Muscle weakness (generalized): Secondary | ICD-10-CM | POA: Diagnosis not present

## 2017-08-08 DIAGNOSIS — I10 Essential (primary) hypertension: Secondary | ICD-10-CM | POA: Diagnosis not present

## 2017-08-08 DIAGNOSIS — F209 Schizophrenia, unspecified: Secondary | ICD-10-CM | POA: Diagnosis not present

## 2017-08-08 DIAGNOSIS — G20 Parkinson's disease: Secondary | ICD-10-CM | POA: Diagnosis not present

## 2017-08-08 NOTE — Telephone Encounter (Signed)
Called and attempted to reach RN at the # provided and no answer.  I called facility and spoke to sabrina, supervisor, and she relayed since 08-06-17 taking the increase of the sinemet to qid, he has noted dizziness.  When he refused a dose on the 08-07-17 he stated to PT that he felt better and dizziness was better.  He took all 4 doses yesterday and again was bothered by dizziness.   He is wanting to go back to taking 3 times daily.  Please advise.

## 2017-08-08 NOTE — Telephone Encounter (Signed)
Patients POA and Sister called - patient noted dizziness with increase of sinemet from 3 to 4 a day. He now refused 4th dose. Is it ok to stay on 3 a day/ the therapists that work with him called the office today and he is psychotic.  I advised to return to 3 a day, and skip the fourth. FYI. Marland Kitchen

## 2017-08-08 NOTE — Telephone Encounter (Signed)
Thank you for calling. I will have [facility] reach out tomorrow. Agree with plan. -VRP

## 2017-08-08 NOTE — Telephone Encounter (Signed)
Pt sister(on DPR) has called stating that pt has refused to take his carbidopa-levodopa (SINEMET IR) 25-100 MG tablet until he is able to be built up to again take the 3 a day.  Sister states pt refusing to take the medication because of the off and on again dizziness.  Sister states RN can call the assisted living facility at [phone] the RN's station # is [phone].  Sister states if RN is unable to reach anyone at either of those #'s she can be reached at anytime

## 2017-08-08 NOTE — Telephone Encounter (Signed)
Pts sister called stating that since the change in medication dosing of carbidopa-levodopa (SINEMET IR) 25-100 MG tablet he has felt exteremly dizzy. Would like a call back to discuss going back to original dosing of 3 tablets.

## 2017-08-08 NOTE — Telephone Encounter (Signed)
Ok to go back to three times a day dosing. -VRP

## 2017-08-09 DIAGNOSIS — G20 Parkinson's disease: Secondary | ICD-10-CM | POA: Diagnosis not present

## 2017-08-09 DIAGNOSIS — I10 Essential (primary) hypertension: Secondary | ICD-10-CM | POA: Diagnosis not present

## 2017-08-09 DIAGNOSIS — M6281 Muscle weakness (generalized): Secondary | ICD-10-CM | POA: Diagnosis not present

## 2017-08-09 DIAGNOSIS — M4802 Spinal stenosis, cervical region: Secondary | ICD-10-CM | POA: Diagnosis not present

## 2017-08-09 DIAGNOSIS — F209 Schizophrenia, unspecified: Secondary | ICD-10-CM | POA: Diagnosis not present

## 2017-08-09 MED ORDER — CARBIDOPA-LEVODOPA 25-100 MG PO TABS: 1.0000 | ORAL_TABLET | Freq: Three times a day (TID) | ORAL | 12 refills | Status: DC

## 2017-08-09 NOTE — Telephone Encounter (Signed)
Done, see other note.

## 2017-08-09 NOTE — Telephone Encounter (Signed)
Pt's sister called to advise the facility will need a new RX sent to [facility] with dose change. She is requesting it to be sent asap if possible. Thank you

## 2017-08-09 NOTE — Telephone Encounter (Signed)
Called sister of pt and relayed that new prescription with sinemet changed back to original dosing of TID.  Prescription sent electronically to [facility] with (noted dose change).  She verbalized understanding.

## 2017-08-12 DIAGNOSIS — I10 Essential (primary) hypertension: Secondary | ICD-10-CM | POA: Diagnosis not present

## 2017-08-12 DIAGNOSIS — M4802 Spinal stenosis, cervical region: Secondary | ICD-10-CM | POA: Diagnosis not present

## 2017-08-12 DIAGNOSIS — F209 Schizophrenia, unspecified: Secondary | ICD-10-CM | POA: Diagnosis not present

## 2017-08-12 DIAGNOSIS — M6281 Muscle weakness (generalized): Secondary | ICD-10-CM | POA: Diagnosis not present

## 2017-08-12 DIAGNOSIS — G20 Parkinson's disease: Secondary | ICD-10-CM | POA: Diagnosis not present

## 2017-08-16 DIAGNOSIS — G20 Parkinson's disease: Secondary | ICD-10-CM | POA: Diagnosis not present

## 2017-08-16 DIAGNOSIS — I10 Essential (primary) hypertension: Secondary | ICD-10-CM | POA: Diagnosis not present

## 2017-08-16 DIAGNOSIS — M6281 Muscle weakness (generalized): Secondary | ICD-10-CM | POA: Diagnosis not present

## 2017-08-16 DIAGNOSIS — F209 Schizophrenia, unspecified: Secondary | ICD-10-CM | POA: Diagnosis not present

## 2017-08-16 DIAGNOSIS — M4802 Spinal stenosis, cervical region: Secondary | ICD-10-CM | POA: Diagnosis not present

## 2017-08-18 DIAGNOSIS — I10 Essential (primary) hypertension: Secondary | ICD-10-CM | POA: Diagnosis not present

## 2017-08-18 DIAGNOSIS — G20 Parkinson's disease: Secondary | ICD-10-CM | POA: Diagnosis not present

## 2017-08-18 DIAGNOSIS — M4802 Spinal stenosis, cervical region: Secondary | ICD-10-CM | POA: Diagnosis not present

## 2017-08-18 DIAGNOSIS — M6281 Muscle weakness (generalized): Secondary | ICD-10-CM | POA: Diagnosis not present

## 2017-08-18 DIAGNOSIS — F209 Schizophrenia, unspecified: Secondary | ICD-10-CM | POA: Diagnosis not present

## 2017-08-22 DIAGNOSIS — F209 Schizophrenia, unspecified: Secondary | ICD-10-CM | POA: Diagnosis not present

## 2017-08-22 DIAGNOSIS — M6281 Muscle weakness (generalized): Secondary | ICD-10-CM | POA: Diagnosis not present

## 2017-08-22 DIAGNOSIS — I10 Essential (primary) hypertension: Secondary | ICD-10-CM | POA: Diagnosis not present

## 2017-08-22 DIAGNOSIS — G20 Parkinson's disease: Secondary | ICD-10-CM | POA: Diagnosis not present

## 2017-08-22 DIAGNOSIS — M4802 Spinal stenosis, cervical region: Secondary | ICD-10-CM | POA: Diagnosis not present

## 2017-08-23 DIAGNOSIS — M4802 Spinal stenosis, cervical region: Secondary | ICD-10-CM | POA: Diagnosis not present

## 2017-08-23 DIAGNOSIS — M6281 Muscle weakness (generalized): Secondary | ICD-10-CM | POA: Diagnosis not present

## 2017-08-23 DIAGNOSIS — F209 Schizophrenia, unspecified: Secondary | ICD-10-CM | POA: Diagnosis not present

## 2017-08-23 DIAGNOSIS — G20 Parkinson's disease: Secondary | ICD-10-CM | POA: Diagnosis not present

## 2017-08-23 DIAGNOSIS — I10 Essential (primary) hypertension: Secondary | ICD-10-CM | POA: Diagnosis not present

## 2017-08-24 DIAGNOSIS — M6281 Muscle weakness (generalized): Secondary | ICD-10-CM | POA: Diagnosis not present

## 2017-08-24 DIAGNOSIS — I10 Essential (primary) hypertension: Secondary | ICD-10-CM | POA: Diagnosis not present

## 2017-08-24 DIAGNOSIS — F209 Schizophrenia, unspecified: Secondary | ICD-10-CM | POA: Diagnosis not present

## 2017-08-24 DIAGNOSIS — M4802 Spinal stenosis, cervical region: Secondary | ICD-10-CM | POA: Diagnosis not present

## 2017-08-24 DIAGNOSIS — G20 Parkinson's disease: Secondary | ICD-10-CM | POA: Diagnosis not present

## 2017-08-25 DIAGNOSIS — G20 Parkinson's disease: Secondary | ICD-10-CM | POA: Diagnosis not present

## 2017-08-25 DIAGNOSIS — I10 Essential (primary) hypertension: Secondary | ICD-10-CM | POA: Diagnosis not present

## 2017-08-25 DIAGNOSIS — R3911 Hesitancy of micturition: Secondary | ICD-10-CM | POA: Diagnosis not present

## 2017-08-25 DIAGNOSIS — N401 Enlarged prostate with lower urinary tract symptoms: Secondary | ICD-10-CM | POA: Diagnosis not present

## 2017-08-25 DIAGNOSIS — R3914 Feeling of incomplete bladder emptying: Secondary | ICD-10-CM | POA: Diagnosis not present

## 2017-08-25 DIAGNOSIS — M6281 Muscle weakness (generalized): Secondary | ICD-10-CM | POA: Diagnosis not present

## 2017-08-25 DIAGNOSIS — M4802 Spinal stenosis, cervical region: Secondary | ICD-10-CM | POA: Diagnosis not present

## 2017-08-25 DIAGNOSIS — F209 Schizophrenia, unspecified: Secondary | ICD-10-CM | POA: Diagnosis not present

## 2017-08-29 DIAGNOSIS — F209 Schizophrenia, unspecified: Secondary | ICD-10-CM | POA: Diagnosis not present

## 2017-08-29 DIAGNOSIS — M6281 Muscle weakness (generalized): Secondary | ICD-10-CM | POA: Diagnosis not present

## 2017-08-29 DIAGNOSIS — I10 Essential (primary) hypertension: Secondary | ICD-10-CM | POA: Diagnosis not present

## 2017-08-29 DIAGNOSIS — G20 Parkinson's disease: Secondary | ICD-10-CM | POA: Diagnosis not present

## 2017-08-29 DIAGNOSIS — M4802 Spinal stenosis, cervical region: Secondary | ICD-10-CM | POA: Diagnosis not present

## 2017-08-30 DIAGNOSIS — F209 Schizophrenia, unspecified: Secondary | ICD-10-CM | POA: Diagnosis not present

## 2017-08-30 DIAGNOSIS — G20 Parkinson's disease: Secondary | ICD-10-CM | POA: Diagnosis not present

## 2017-08-30 DIAGNOSIS — M6281 Muscle weakness (generalized): Secondary | ICD-10-CM | POA: Diagnosis not present

## 2017-08-30 DIAGNOSIS — M4802 Spinal stenosis, cervical region: Secondary | ICD-10-CM | POA: Diagnosis not present

## 2017-08-30 DIAGNOSIS — I10 Essential (primary) hypertension: Secondary | ICD-10-CM | POA: Diagnosis not present

## 2017-09-02 DIAGNOSIS — I10 Essential (primary) hypertension: Secondary | ICD-10-CM | POA: Diagnosis not present

## 2017-09-02 DIAGNOSIS — F209 Schizophrenia, unspecified: Secondary | ICD-10-CM | POA: Diagnosis not present

## 2017-09-02 DIAGNOSIS — M4802 Spinal stenosis, cervical region: Secondary | ICD-10-CM | POA: Diagnosis not present

## 2017-09-02 DIAGNOSIS — M6281 Muscle weakness (generalized): Secondary | ICD-10-CM | POA: Diagnosis not present

## 2017-09-02 DIAGNOSIS — G20 Parkinson's disease: Secondary | ICD-10-CM | POA: Diagnosis not present

## 2017-09-04 DIAGNOSIS — I10 Essential (primary) hypertension: Secondary | ICD-10-CM | POA: Diagnosis not present

## 2017-09-04 DIAGNOSIS — G20 Parkinson's disease: Secondary | ICD-10-CM | POA: Diagnosis not present

## 2017-09-04 DIAGNOSIS — M6281 Muscle weakness (generalized): Secondary | ICD-10-CM | POA: Diagnosis not present

## 2017-09-04 DIAGNOSIS — F209 Schizophrenia, unspecified: Secondary | ICD-10-CM | POA: Diagnosis not present

## 2017-09-04 DIAGNOSIS — M4802 Spinal stenosis, cervical region: Secondary | ICD-10-CM | POA: Diagnosis not present

## 2017-09-08 ENCOUNTER — Telehealth: Payer: Self-pay | Admitting: Family Medicine

## 2017-09-08 DIAGNOSIS — I10 Essential (primary) hypertension: Secondary | ICD-10-CM | POA: Diagnosis not present

## 2017-09-08 DIAGNOSIS — M6281 Muscle weakness (generalized): Secondary | ICD-10-CM | POA: Diagnosis not present

## 2017-09-08 DIAGNOSIS — M4802 Spinal stenosis, cervical region: Secondary | ICD-10-CM | POA: Diagnosis not present

## 2017-09-08 DIAGNOSIS — F209 Schizophrenia, unspecified: Secondary | ICD-10-CM | POA: Diagnosis not present

## 2017-09-08 DIAGNOSIS — G20 Parkinson's disease: Secondary | ICD-10-CM | POA: Diagnosis not present

## 2017-09-08 NOTE — Telephone Encounter (Signed)
Verbal ok to continue OT

## 2017-09-09 DIAGNOSIS — F209 Schizophrenia, unspecified: Secondary | ICD-10-CM | POA: Diagnosis not present

## 2017-09-09 DIAGNOSIS — M4802 Spinal stenosis, cervical region: Secondary | ICD-10-CM | POA: Diagnosis not present

## 2017-09-09 DIAGNOSIS — M6281 Muscle weakness (generalized): Secondary | ICD-10-CM | POA: Diagnosis not present

## 2017-09-09 DIAGNOSIS — G20 Parkinson's disease: Secondary | ICD-10-CM | POA: Diagnosis not present

## 2017-09-09 DIAGNOSIS — I10 Essential (primary) hypertension: Secondary | ICD-10-CM | POA: Diagnosis not present

## 2017-09-13 ENCOUNTER — Telehealth: Payer: Self-pay | Admitting: Diagnostic Neuroimaging

## 2017-09-13 DIAGNOSIS — G20 Parkinson's disease: Secondary | ICD-10-CM | POA: Diagnosis not present

## 2017-09-13 DIAGNOSIS — I10 Essential (primary) hypertension: Secondary | ICD-10-CM | POA: Diagnosis not present

## 2017-09-13 DIAGNOSIS — M6281 Muscle weakness (generalized): Secondary | ICD-10-CM | POA: Diagnosis not present

## 2017-09-13 DIAGNOSIS — M4802 Spinal stenosis, cervical region: Secondary | ICD-10-CM | POA: Diagnosis not present

## 2017-09-13 DIAGNOSIS — F209 Schizophrenia, unspecified: Secondary | ICD-10-CM | POA: Diagnosis not present

## 2017-09-13 NOTE — Telephone Encounter (Signed)
Mark Benjamin an OT with [facility] requesting a call to discuss a medication adjustment. Stating they would like to try 3 and a half tablets daily for Sinemet. [name] can be reached at [phone]

## 2017-09-13 NOTE — Telephone Encounter (Signed)
Aneta Mins, OT and inquired why she has requested Sinemet be increased back to 3 1/2 tabs daily. Advised her that when it was increased to 4 x daily the patient was having dizziness. She stated the patient is asking if adding 1/2 tab would help him increase ROM of his left arm. She stated that when he was taking it 4 x daily he had good increase with ROM. He is hoping that adding 1/2 tab will help him and not cause issues.  Advised [name] will discuss with Dr Leta Baptist and call her back. She verbalized understanding, appreciation.

## 2017-09-14 ENCOUNTER — Ambulatory Visit (INDEPENDENT_AMBULATORY_CARE_PROVIDER_SITE_OTHER): Payer: Medicare Other | Admitting: Urology

## 2017-09-14 DIAGNOSIS — N401 Enlarged prostate with lower urinary tract symptoms: Secondary | ICD-10-CM | POA: Diagnosis not present

## 2017-09-14 DIAGNOSIS — R3914 Feeling of incomplete bladder emptying: Secondary | ICD-10-CM

## 2017-09-14 NOTE — Telephone Encounter (Signed)
Ok agree wth plan. -VRP

## 2017-09-14 NOTE — Telephone Encounter (Signed)
Spoke with [name], OT and advised her Dr Leta Baptist agrees with patient taking extra 1/2 tab with evening dose. Advised her a new prescription is not sent at this time. Dr Leta Baptist will wait to make sure the patient tolerates the increase. A new Rx can be sent in if he does. [name] verbalized understanding, appreciation.

## 2017-09-15 ENCOUNTER — Other Ambulatory Visit: Payer: Self-pay | Admitting: Diagnostic Neuroimaging

## 2017-09-15 MED ORDER — CARBIDOPA-LEVODOPA 25-100 MG PO TABS: ORAL_TABLET | ORAL | 12 refills | Status: DC

## 2017-09-15 NOTE — Telephone Encounter (Signed)
Received fax from [facility] with new Rx for carbidopa-levo. The dosing is not the same as this RN discussed with [name] OT yesterday. [place] and spoke with Gabriel Cirri, day time supervisor Janett Billow is off today). Henriette Combs of error. She stated Dr Leta Baptist can d/c order he doesn't want and write new order on paper; then fax back to [facility]. This RN thanked her for her help.

## 2017-09-16 DIAGNOSIS — M4802 Spinal stenosis, cervical region: Secondary | ICD-10-CM | POA: Diagnosis not present

## 2017-09-16 DIAGNOSIS — G20 Parkinson's disease: Secondary | ICD-10-CM | POA: Diagnosis not present

## 2017-09-16 DIAGNOSIS — M6281 Muscle weakness (generalized): Secondary | ICD-10-CM | POA: Diagnosis not present

## 2017-09-16 DIAGNOSIS — I10 Essential (primary) hypertension: Secondary | ICD-10-CM | POA: Diagnosis not present

## 2017-09-16 DIAGNOSIS — F209 Schizophrenia, unspecified: Secondary | ICD-10-CM | POA: Diagnosis not present

## 2017-09-20 ENCOUNTER — Other Ambulatory Visit: Payer: Self-pay | Admitting: Urology

## 2017-09-21 DIAGNOSIS — G20 Parkinson's disease: Secondary | ICD-10-CM | POA: Diagnosis not present

## 2017-09-21 DIAGNOSIS — F209 Schizophrenia, unspecified: Secondary | ICD-10-CM | POA: Diagnosis not present

## 2017-09-21 DIAGNOSIS — M6281 Muscle weakness (generalized): Secondary | ICD-10-CM | POA: Diagnosis not present

## 2017-09-21 DIAGNOSIS — M4802 Spinal stenosis, cervical region: Secondary | ICD-10-CM | POA: Diagnosis not present

## 2017-09-21 DIAGNOSIS — I10 Essential (primary) hypertension: Secondary | ICD-10-CM | POA: Diagnosis not present

## 2017-09-23 DIAGNOSIS — M6281 Muscle weakness (generalized): Secondary | ICD-10-CM | POA: Diagnosis not present

## 2017-09-23 DIAGNOSIS — M4802 Spinal stenosis, cervical region: Secondary | ICD-10-CM | POA: Diagnosis not present

## 2017-09-23 DIAGNOSIS — F209 Schizophrenia, unspecified: Secondary | ICD-10-CM | POA: Diagnosis not present

## 2017-09-23 DIAGNOSIS — G20 Parkinson's disease: Secondary | ICD-10-CM | POA: Diagnosis not present

## 2017-09-23 DIAGNOSIS — I10 Essential (primary) hypertension: Secondary | ICD-10-CM | POA: Diagnosis not present

## 2017-09-27 DIAGNOSIS — I10 Essential (primary) hypertension: Secondary | ICD-10-CM | POA: Diagnosis not present

## 2017-09-27 DIAGNOSIS — F209 Schizophrenia, unspecified: Secondary | ICD-10-CM | POA: Diagnosis not present

## 2017-09-27 DIAGNOSIS — M6281 Muscle weakness (generalized): Secondary | ICD-10-CM | POA: Diagnosis not present

## 2017-09-27 DIAGNOSIS — M4802 Spinal stenosis, cervical region: Secondary | ICD-10-CM | POA: Diagnosis not present

## 2017-09-27 DIAGNOSIS — G20 Parkinson's disease: Secondary | ICD-10-CM | POA: Diagnosis not present

## 2017-09-28 DIAGNOSIS — M4802 Spinal stenosis, cervical region: Secondary | ICD-10-CM | POA: Diagnosis not present

## 2017-09-28 DIAGNOSIS — F209 Schizophrenia, unspecified: Secondary | ICD-10-CM | POA: Diagnosis not present

## 2017-09-28 DIAGNOSIS — I10 Essential (primary) hypertension: Secondary | ICD-10-CM | POA: Diagnosis not present

## 2017-09-28 DIAGNOSIS — G20 Parkinson's disease: Secondary | ICD-10-CM | POA: Diagnosis not present

## 2017-09-28 DIAGNOSIS — M6281 Muscle weakness (generalized): Secondary | ICD-10-CM | POA: Diagnosis not present

## 2017-09-29 DIAGNOSIS — F29 Unspecified psychosis not due to a substance or known physiological condition: Secondary | ICD-10-CM | POA: Diagnosis not present

## 2017-10-05 DIAGNOSIS — F209 Schizophrenia, unspecified: Secondary | ICD-10-CM | POA: Diagnosis not present

## 2017-10-05 DIAGNOSIS — M6281 Muscle weakness (generalized): Secondary | ICD-10-CM | POA: Diagnosis not present

## 2017-10-05 DIAGNOSIS — G20 Parkinson's disease: Secondary | ICD-10-CM | POA: Diagnosis not present

## 2017-10-05 DIAGNOSIS — I10 Essential (primary) hypertension: Secondary | ICD-10-CM | POA: Diagnosis not present

## 2017-10-05 DIAGNOSIS — M4802 Spinal stenosis, cervical region: Secondary | ICD-10-CM | POA: Diagnosis not present

## 2017-10-11 ENCOUNTER — Telehealth: Payer: Self-pay | Admitting: Diagnostic Neuroimaging

## 2017-10-11 DIAGNOSIS — M6281 Muscle weakness (generalized): Secondary | ICD-10-CM | POA: Diagnosis not present

## 2017-10-11 DIAGNOSIS — F209 Schizophrenia, unspecified: Secondary | ICD-10-CM | POA: Diagnosis not present

## 2017-10-11 DIAGNOSIS — G20 Parkinson's disease: Secondary | ICD-10-CM | POA: Diagnosis not present

## 2017-10-11 DIAGNOSIS — M4802 Spinal stenosis, cervical region: Secondary | ICD-10-CM | POA: Diagnosis not present

## 2017-10-11 DIAGNOSIS — I10 Essential (primary) hypertension: Secondary | ICD-10-CM | POA: Diagnosis not present

## 2017-10-11 NOTE — Telephone Encounter (Signed)
Mark Benjamin with [facility] is calling. Patient is complaining of increased fatigue 2-3 times a week, starting to drool again, having harder time finding words and having harder time moving muscles on his left side. He does not want any medication changes until after his prostate surgery on 10-24-17. [name] says a returned call is not needed.

## 2017-10-13 NOTE — Patient Instructions (Signed)
Mark Benjamin  10/13/2017     @PREFPERIOPPHARMACY @   Your procedure is scheduled on 10/24/2017.  Report to [facility] at 9:00 A.M.  Call this number if you have problems the morning of surgery:  [phone]   Remember:  Do not eat or drink after midnight.      Take these medicines the morning of surgery with A SIP OF WATER: Cogentin, Depakote, Resperdal, Flomax    Do not wear jewelry, make-up or nail polish.  Do not wear lotions, powders, or perfumes, or deodorant.  Do not shave 48 hours prior to surgery.  Men may shave face and neck.  Do not bring valuables to the hospital.  [facility] is not responsible for any belongings or valuables.  Contacts, dentures or bridgework may not be worn into surgery.  Leave your suitcase in the car.  After surgery it may be brought to your room.  For patients admitted to the hospital, discharge time will be determined by your treatment team.  Patients discharged the day of surgery will not be allowed to drive home.    Please read over the following fact sheets that you were given. Surgical Site Infection Prevention and Anesthesia Post-op Instructions     PATIENT INSTRUCTIONS POST-ANESTHESIA  IMMEDIATELY FOLLOWING SURGERY:  Do not drive or operate machinery for the first twenty four hours after surgery.  Do not make any important decisions for twenty four hours after surgery or while taking narcotic pain medications or sedatives.  If you develop intractable nausea and vomiting or a severe headache please notify your doctor immediately.  FOLLOW-UP:  Please make an appointment with your surgeon as instructed. You do not need to follow up with anesthesia unless specifically instructed to do so.  WOUND CARE INSTRUCTIONS (if applicable):  Keep a dry clean dressing on the anesthesia/puncture wound site if there is drainage.  Once the wound has quit draining you may leave it open to air.  Generally you should leave the bandage intact for twenty  four hours unless there is drainage.  If the epidural site drains for more than 36-48 hours please call the anesthesia department.  QUESTIONS?:  Please feel free to call your physician or the hospital operator if you have any questions, and they will be happy to assist you.      Cystoscopy Cystoscopy is a procedure that is used to help diagnose and sometimes treat conditions that affect that lower urinary tract. The lower urinary tract includes the bladder and the tube that drains urine from the bladder out of the body (urethra). Cystoscopy is performed with a thin, tube-shaped instrument with a light and camera at the end (cystoscope). The cystoscope may be hard (rigid) or flexible, depending on the goal of the procedure.The cystoscope is inserted through the urethra, into the bladder. Cystoscopy may be recommended if you have:  Urinary tractinfections that keep coming back (recurring).  Blood in the urine (hematuria).  Loss of bladder control (urinary incontinence) or an overactive bladder.  Unusual cells found in a urine sample.  A blockage in the urethra.  Painful urination.  An abnormality in the bladder found during an intravenous pyelogram (IVP) or CT scan.  Cystoscopy may also be done to remove a sample of tissue to be examined under a microscope (biopsy). Tell a health care provider about:  Any allergies you have.  All medicines you are taking, including vitamins, herbs, eye drops, creams, and over-the-counter medicines.  Any problems you or family members  have had with anesthetic medicines.  Any blood disorders you have.  Any surgeries you have had.  Any medical conditions you have.  Whether you are pregnant or may be pregnant. What are the risks? Generally, this is a safe procedure. However, problems may occur, including:  Infection.  Bleeding.  Allergic reactions to medicines.  Damage to other structures or organs.  What happens before the  procedure?  Ask your health care provider about: ? Changing or stopping your regular medicines. This is especially important if you are taking diabetes medicines or blood thinners. ? Taking medicines such as aspirin and ibuprofen. These medicines can thin your blood. Do not take these medicines before your procedure if your health care provider instructs you not to.  Follow instructions from your health care provider about eating or drinking restrictions.  You may be given antibiotic medicine to help prevent infection.  You may have an exam or testing, such as X-rays of the bladder, urethra, or kidneys.  You may have urine tests to check for signs of infection.  Plan to have someone take you home after the procedure. What happens during the procedure?  To reduce your risk of infection,your health care team will wash or sanitize their hands.  You will be given one or more of the following: ? A medicine to help you relax (sedative). ? A medicine to numb the area (local anesthetic).  The area around the opening of your urethra will be cleaned.  The cystoscope will be passed through your urethra into your bladder.  Germ-free (sterile)fluid will flow through the cystoscope to fill your bladder. The fluid will stretch your bladder so that your surgeon can clearly examine your bladder walls.  The cystoscope will be removed and your bladder will be emptied. The procedure may vary among health care providers and hospitals. What happens after the procedure?  You may have some soreness or pain in your abdomen and urethra. Medicines will be available to help you.  You may have some blood in your urine.  Do not drive for 24 hours if you received a sedative. This information is not intended to replace advice given to you by your health care provider. Make sure you discuss any questions you have with your health care provider. Document Released: 03/05/2000 Document Revised: 07/17/2015  Document Reviewed: 01/23/2015 Elsevier Interactive Patient Education  [employer].

## 2017-10-17 ENCOUNTER — Encounter (HOSPITAL_COMMUNITY): Payer: Self-pay

## 2017-10-17 ENCOUNTER — Encounter (HOSPITAL_COMMUNITY)
Admission: RE | Admit: 2017-10-17 | Discharge: 2017-10-17 | Disposition: A | Discharge status: Home / Self Care | Payer: Medicare Other | Source: Ambulatory Visit | Attending: Urology | Admitting: Urology

## 2017-10-17 ENCOUNTER — Other Ambulatory Visit: Payer: Self-pay

## 2017-10-17 DIAGNOSIS — Z01812 Encounter for preprocedural laboratory examination: Secondary | ICD-10-CM | POA: Insufficient documentation

## 2017-10-17 DIAGNOSIS — R9431 Abnormal electrocardiogram [ECG] [EKG]: Secondary | ICD-10-CM | POA: Insufficient documentation

## 2017-10-17 DIAGNOSIS — Z01810 Encounter for preprocedural cardiovascular examination: Secondary | ICD-10-CM | POA: Diagnosis not present

## 2017-10-17 LAB — CBC
HCT: 33.7 % — ABNORMAL LOW (ref 39.0–52.0)
HEMOGLOBIN: 11.3 g/dL — AB (ref 13.0–17.0)
MCH: 30.4 pg (ref 26.0–34.0)
MCHC: 33.5 g/dL (ref 30.0–36.0)
MCV: 90.6 fL (ref 78.0–100.0)
PLATELETS: 312 10*3/uL (ref 150–400)
RBC: 3.72 MIL/uL — ABNORMAL LOW (ref 4.22–5.81)
RDW: 11.6 % (ref 11.5–15.5)
WBC: 8.2 10*3/uL (ref 4.0–10.5)

## 2017-10-17 LAB — BASIC METABOLIC PANEL
Anion gap: 6 (ref 5–15)
BUN: 8 mg/dL (ref 6–20)
CALCIUM: 9.1 mg/dL (ref 8.9–10.3)
CO2: 31 mmol/L (ref 22–32)
CREATININE: 0.73 mg/dL (ref 0.61–1.24)
Chloride: 97 mmol/L — ABNORMAL LOW (ref 98–111)
GFR calc Af Amer: >60 mL/min (ref >60)
GFR calc non Af Amer: >60 mL/min (ref >60)
Glucose, Bld: 105 mg/dL — ABNORMAL HIGH (ref 70–99)
Potassium: 4 mmol/L (ref 3.5–5.1)
Sodium: 134 mmol/L — ABNORMAL LOW (ref 135–145)

## 2017-10-19 DIAGNOSIS — M6281 Muscle weakness (generalized): Secondary | ICD-10-CM | POA: Diagnosis not present

## 2017-10-19 DIAGNOSIS — M4802 Spinal stenosis, cervical region: Secondary | ICD-10-CM | POA: Diagnosis not present

## 2017-10-19 DIAGNOSIS — G20 Parkinson's disease: Secondary | ICD-10-CM | POA: Diagnosis not present

## 2017-10-19 DIAGNOSIS — I10 Essential (primary) hypertension: Secondary | ICD-10-CM | POA: Diagnosis not present

## 2017-10-19 DIAGNOSIS — F209 Schizophrenia, unspecified: Secondary | ICD-10-CM | POA: Diagnosis not present

## 2017-10-24 ENCOUNTER — Encounter (HOSPITAL_COMMUNITY): Payer: Self-pay | Admitting: *Deleted

## 2017-10-24 ENCOUNTER — Ambulatory Visit (HOSPITAL_COMMUNITY): Payer: Medicare Other | Admitting: Anesthesiology

## 2017-10-24 ENCOUNTER — Other Ambulatory Visit: Payer: Self-pay

## 2017-10-24 ENCOUNTER — Encounter (HOSPITAL_COMMUNITY)
Admission: RE | Disposition: A | Discharge status: Home / Self Care | Payer: Self-pay | Source: Ambulatory Visit | Attending: Urology

## 2017-10-24 ENCOUNTER — Ambulatory Visit (HOSPITAL_COMMUNITY)
Admission: RE | Admit: 2017-10-24 | Discharge: 2017-10-24 | Disposition: A | Discharge status: Home / Self Care | Payer: Medicare Other | Source: Ambulatory Visit | Attending: Urology | Admitting: Urology

## 2017-10-24 DIAGNOSIS — F209 Schizophrenia, unspecified: Secondary | ICD-10-CM | POA: Diagnosis not present

## 2017-10-24 DIAGNOSIS — Z86010 Personal history of colonic polyps: Secondary | ICD-10-CM | POA: Diagnosis not present

## 2017-10-24 DIAGNOSIS — Z881 Allergy status to other antibiotic agents status: Secondary | ICD-10-CM | POA: Insufficient documentation

## 2017-10-24 DIAGNOSIS — Z882 Allergy status to sulfonamides status: Secondary | ICD-10-CM | POA: Diagnosis not present

## 2017-10-24 DIAGNOSIS — Z880 Allergy status to penicillin: Secondary | ICD-10-CM | POA: Insufficient documentation

## 2017-10-24 DIAGNOSIS — N400 Enlarged prostate without lower urinary tract symptoms: Secondary | ICD-10-CM | POA: Diagnosis not present

## 2017-10-24 DIAGNOSIS — I10 Essential (primary) hypertension: Secondary | ICD-10-CM | POA: Insufficient documentation

## 2017-10-24 DIAGNOSIS — G20 Parkinson's disease: Secondary | ICD-10-CM | POA: Diagnosis not present

## 2017-10-24 DIAGNOSIS — N401 Enlarged prostate with lower urinary tract symptoms: Secondary | ICD-10-CM | POA: Diagnosis not present

## 2017-10-24 DIAGNOSIS — Z8249 Family history of ischemic heart disease and other diseases of the circulatory system: Secondary | ICD-10-CM | POA: Insufficient documentation

## 2017-10-24 DIAGNOSIS — D649 Anemia, unspecified: Secondary | ICD-10-CM | POA: Diagnosis not present

## 2017-10-24 DIAGNOSIS — N320 Bladder-neck obstruction: Secondary | ICD-10-CM | POA: Diagnosis not present

## 2017-10-24 DIAGNOSIS — N138 Other obstructive and reflux uropathy: Secondary | ICD-10-CM | POA: Diagnosis not present

## 2017-10-24 HISTORY — PX: CYSTOSCOPY WITH INSERTION OF UROLIFT: SHX6678

## 2017-10-24 SURGERY — CYSTOSCOPY WITH INSERTION OF UROLIFT
Anesthesia: Monitor Anesthesia Care

## 2017-10-24 MED ORDER — MIDAZOLAM HCL 2 MG/2ML IJ SOLN
INTRAMUSCULAR | Status: AC
  Filled 2017-10-24: qty 2

## 2017-10-24 MED ORDER — HYDROCODONE-ACETAMINOPHEN 7.5-325 MG PO TABS
1.0000 | ORAL_TABLET | Freq: Once | ORAL | Status: AC | PRN
  Administered 2017-10-24: 1 via ORAL

## 2017-10-24 MED ORDER — LIDOCAINE HCL URETHRAL/MUCOSAL 2 % EX GEL
CUTANEOUS | Status: AC
  Filled 2017-10-24: qty 10

## 2017-10-24 MED ORDER — STERILE WATER FOR IRRIGATION IR SOLN
Status: DC | PRN
  Administered 2017-10-24: 1000 mL

## 2017-10-24 MED ORDER — SODIUM CHLORIDE 0.9 % IR SOLN
Status: DC | PRN
  Administered 2017-10-24: 3000 mL via INTRAVESICAL

## 2017-10-24 MED ORDER — LACTATED RINGERS IV SOLN
INTRAVENOUS | Status: DC
  Administered 2017-10-24: 09:00:00 via INTRAVENOUS

## 2017-10-24 MED ORDER — MIDAZOLAM HCL 5 MG/5ML IJ SOLN
INTRAMUSCULAR | Status: DC | PRN
  Administered 2017-10-24 (×2): 1 mg via INTRAVENOUS

## 2017-10-24 MED ORDER — HYDROCODONE-ACETAMINOPHEN 7.5-325 MG PO TABS
ORAL_TABLET | ORAL | Status: AC
  Filled 2017-10-24: qty 1

## 2017-10-24 MED ORDER — PROPOFOL 500 MG/50ML IV EMUL
INTRAVENOUS | Status: DC | PRN
  Administered 2017-10-24: 45 ug/kg/min via INTRAVENOUS

## 2017-10-24 MED ORDER — FENTANYL CITRATE (PF) 100 MCG/2ML IJ SOLN: 25.0000 ug | INTRAMUSCULAR | Status: DC | PRN

## 2017-10-24 MED ORDER — PROPOFOL 10 MG/ML IV BOLUS
INTRAVENOUS | Status: AC
  Filled 2017-10-24: qty 20

## 2017-10-24 MED ORDER — TRAMADOL HCL 50 MG PO TABS: 50.0000 mg | ORAL_TABLET | Freq: Four times a day (QID) | ORAL | 0 refills | Status: DC | PRN

## 2017-10-24 MED ORDER — CEFAZOLIN SODIUM-DEXTROSE 2-4 GM/100ML-% IV SOLN
INTRAVENOUS | Status: AC
  Filled 2017-10-24: qty 100

## 2017-10-24 MED ORDER — LIDOCAINE HCL 2 % EX GEL
CUTANEOUS | Status: DC | PRN
  Administered 2017-10-24: 1 via URETHRAL

## 2017-10-24 MED ORDER — CEFAZOLIN SODIUM-DEXTROSE 2-4 GM/100ML-% IV SOLN
2.0000 g | INTRAVENOUS | Status: AC
  Administered 2017-10-24: 2 g via INTRAVENOUS

## 2017-10-24 MED ORDER — FENTANYL CITRATE (PF) 100 MCG/2ML IJ SOLN
INTRAMUSCULAR | Status: AC
  Filled 2017-10-24: qty 2

## 2017-10-24 SURGICAL SUPPLY — 21 items
BAG DRAIN URO TABLE W/ADPT NS (BAG) ×3 IMPLANT
BAG DRN 8 ADPR NS SKTRN CSTL (BAG) ×1
CLOTH BEACON ORANGE TIMEOUT ST (SAFETY) ×3 IMPLANT
GLOVE BIO SURGEON STRL SZ8 (GLOVE) ×3 IMPLANT
GLOVE BIOGEL PI IND STRL 6.5 (GLOVE) IMPLANT
GLOVE BIOGEL PI IND STRL 7.0 (GLOVE) ×2 IMPLANT
GLOVE BIOGEL PI INDICATOR 6.5 (GLOVE) ×2
GLOVE BIOGEL PI INDICATOR 7.0 (GLOVE) ×4
GLOVE ECLIPSE 6.5 STRL STRAW (GLOVE) ×2 IMPLANT
GOWN STRL REUS W/TWL LRG LVL3 (GOWN DISPOSABLE) ×3 IMPLANT
GOWN STRL REUS W/TWL XL LVL3 (GOWN DISPOSABLE) ×3 IMPLANT
IV NS IRRIG 3000ML ARTHROMATIC (IV SOLUTION) ×3 IMPLANT
KIT TURNOVER CYSTO (KITS) ×3 IMPLANT
MANIFOLD NEPTUNE II (INSTRUMENTS) ×3 IMPLANT
PACK CYSTO (CUSTOM PROCEDURE TRAY) ×3 IMPLANT
PAD ARMBOARD 7.5X6 YLW CONV (MISCELLANEOUS) ×3 IMPLANT
SYSTEM UROLIFT (Male Continence) ×8 IMPLANT
TOWEL OR 17X26 4PK STRL BLUE (TOWEL DISPOSABLE) ×3 IMPLANT
TRAY FOLEY W/BAG SLVR 16FR (SET/KITS/TRAYS/PACK) ×3
TRAY FOLEY W/BAG SLVR 16FR ST (SET/KITS/TRAYS/PACK) ×1 IMPLANT
WATER STERILE IRR 500ML POUR (IV SOLUTION) ×3 IMPLANT

## 2017-10-24 NOTE — Discharge Instructions (Addendum)
Cystoscopy, Care After Refer to this sheet in the next few weeks. These instructions provide you with information about caring for yourself after your procedure. Your health care provider may also give you more specific instructions. Your treatment has been planned according to current medical practices, but problems sometimes occur. Call your health care provider if you have any problems or questions after your procedure. What can I expect after the procedure? After the procedure, it is common to have:  Mild pain when you urinate. Pain should stop within a few minutes after you urinate. This may last for up to 1 week.  A small amount of blood in your urine for several days.  Feeling like you need to urinate but producing only a small amount of urine.  Follow these instructions at home:  Medicines  Take over-the-counter and prescription medicines only as told by your health care provider.  If you were prescribed an antibiotic medicine, take it as told by your health care provider. Do not stop taking the antibiotic even if you start to feel better. General instructions   Return to your normal activities as told by your health care provider. Ask your health care provider what activities are safe for you.  Do not drive for 24 hours if you received a sedative.  Watch for any blood in your urine. If the amount of blood in your urine increases, call your health care provider.  Follow instructions from your health care provider about eating or drinking restrictions.  If a tissue sample was removed for testing (biopsy) during your procedure, it is your responsibility to get your test results. Ask your health care provider or the department performing the test when your results will be ready.  Drink enough fluid to keep your urine clear or pale yellow.  Keep all follow-up visits as told by your health care provider. This is important. Contact a health care provider if:  You have pain  that gets worse or does not get better with medicine, especially pain when you urinate.  You have difficulty urinating. Get help right away if:  You have more blood in your urine.  You have blood clots in your urine.  You have abdominal pain.  You have a fever or chills.  You are unable to urinate. This information is not intended to replace advice given to you by your health care provider. Make sure you discuss any questions you have with your health care provider. Document Released: 09/25/2004 Document Revised: 08/14/2015 Document Reviewed: 01/23/2015 Elsevier Interactive Patient Education  2018 [place], Adult Take good care of your catheter to keep it working and to prevent problems. How to wear your catheter Attach your catheter to your leg with tape (adhesive tape) or a leg strap. Make sure it is not too tight. If you use tape, remove any bits of tape that are already on the catheter. How to wear a drainage bag You should have:  A large overnight bag.  A small leg bag.  Overnight Bag You may wear the overnight bag at any time. Always keep the bag below the level of your bladder but off the floor. When you sleep, put a clean plastic bag in a wastebasket. Then hang the bag inside the wastebasket. Leg Bag Never wear the leg bag at night. Always wear the leg bag below your knee. Keep the leg bag secure with a leg strap or tape. How to care for your skin  Clean the  skin around the catheter at least once every day.  Shower every day. Do not take baths.  Put creams, lotions, or ointments on your genital area only as told by your doctor.  Do not use powders, sprays, or lotions on your genital area. How to clean your catheter and your skin 1. Wash your hands with soap and water. 2. Wet a washcloth in warm water and gentle (mild) soap. 3. Use the washcloth to clean the skin where the catheter enters your body. Clean downward and wipe away  from the catheter in small circles. Do not wipe toward the catheter. 4. Pat the area dry with a clean towel. Make sure to clean off all soap. How to care for your drainage bags Empty your drainage bag when it is ?- full or at least 2-3 times a day. Replace your drainage bag once a month or sooner if it starts to smell bad or look dirty. Do not clean your drainage bag unless told by your doctor. Emptying a drainage bag  Supplies Needed  Rubbing alcohol.  Gauze pad or cotton ball.  Tape or a leg strap.  Steps 1. Wash your hands with soap and water. 2. Separate (detach) the bag from your leg. 3. Hold the bag over the toilet or a clean container. Keep the bag below your hips and bladder. This stops pee (urine) from going back into the tube. 4. Open the pour spout at the bottom of the bag. 5. Empty the pee into the toilet or container. Do not let the pour spout touch any surface. 6. Put rubbing alcohol on a gauze pad or cotton ball. 7. Use the gauze pad or cotton ball to clean the pour spout. 8. Close the pour spout. 9. Attach the bag to your leg with tape or a leg strap. 10. Wash your hands.  Changing a drainage bag Supplies Needed  Alcohol wipes.  A clean drainage bag.  Adhesive tape or a leg strap.  Steps 1. Wash your hands with soap and water. 2. Separate the dirty bag from your leg. 3. Pinch the rubber catheter with your fingers so that pee does not spill out. 4. Separate the catheter tube from the drainage tube where these tubes connect (at the connection valve). Do not let the tubes touch any surface. 5. Clean the end of the catheter tube with an alcohol wipe. Use a different alcohol wipe to clean the end of the drainage tube. 6. Connect the catheter tube to the drainage tube of the clean bag. 7. Attach the new bag to the leg with adhesive tape or a leg strap. 8. Wash your hands.  How to prevent infection and other problems  Never pull on your catheter or try to  remove it. Pulling can damage tissue in your body.  Always wash your hands before and after touching your catheter.  If a leg strap gets wet, replace it with a dry one.  Drink enough fluids to keep your pee clear or pale yellow, or as told by your doctor.  Do not let the drainage bag or tubing touch the floor.  Wear cotton underwear.  If you are male, wipe from front to back after you poop (have a bowel movement).  Check on the catheter often to make sure it works and the tubing is not twisted. Get help if:  Your pee is cloudy.  Your pee smells unusually bad.  Your pee is not draining into the bag.  Your tube gets clogged.  Your catheter starts to leak.  Your bladder feels full. Get help right away if:  You have redness, swelling, or pain where the catheter enters your body.  You have fluid, pus, or a bad smell coming from the area where the catheter enters your body.  The area where the catheter enters your body feels warm.  You have a fever.  You have pain in your: ? Stomach (abdomen). ? Legs. ? Lower back. ? Bladder.  You see blood fill the catheter.  Your pee is pink or red.  You feel sick to your stomach (nauseous).  You throw up (vomit).  You have chills.  Your catheter gets pulled out. This information is not intended to replace advice given to you by your health care provider. Make sure you discuss any questions you have with your health care provider. Document Released: 07/03/2012 Document Revised: 02/04/2016 Document Reviewed: 08/21/2013 Elsevier Interactive Patient Education  [employer].

## 2017-10-24 NOTE — Anesthesia Preprocedure Evaluation (Signed)
Anesthesia Evaluation  Patient identified by MRN, date of birth, ID band Patient awake    Reviewed: Allergy & Precautions, NPO status , Patient's Chart, lab work & pertinent test results  Airway Mallampati: I  TM Distance: >3 FB Neck ROM: Full  Mouth opening: Limited Mouth Opening  Dental no notable dental hx. (+) Teeth Intact   Pulmonary neg pulmonary ROS,    Pulmonary exam normal breath sounds clear to auscultation       Cardiovascular Exercise Tolerance: Good hypertension, negative cardio ROS Normal cardiovascular examI Rhythm:Regular Rate:Normal     Neuro/Psych Schizophrenia parkinsons on sinemet - appears very parkinsonian, sister says improved with the meds  Walks using a walker for support  negative neurological ROS  negative psych ROS   GI/Hepatic negative GI ROS, Neg liver ROS,   Endo/Other  negative endocrine ROS  Renal/GU negative Renal ROSOutlet Obstruction -here for urolift   negative genitourinary   Musculoskeletal negative musculoskeletal ROS (+)   Abdominal   Peds negative pediatric ROS (+)  Hematology negative hematology ROS (+) anemia ,   Anesthesia Other Findings   Reproductive/Obstetrics negative OB ROS                             Anesthesia Physical Anesthesia Plan  ASA: III  Anesthesia Plan: MAC   Post-op Pain Management:    Induction:   PONV Risk Score and Plan:   Airway Management Planned: Nasal Cannula and Simple Face Mask  Additional Equipment:   Intra-op Plan:   Post-operative Plan:   Informed Consent:   Dental advisory given  Plan Discussed with:   Anesthesia Plan Comments:         Anesthesia Quick Evaluation

## 2017-10-24 NOTE — H&P (Signed)
Urology Admission H&P  Chief Complaint: BPH with urinary urgency  History of Present Illness: Mark Benjamin is a 57yo with a hx of BPH who has failed medical therapy. He has a hx of parkinsons disease. He cannot tolerate alpha blockers. He denies fevers/chills/sweats  Past Medical History:  Diagnosis Date  . Cataract   . Colon polyps   . Dysphagia   . Essential hypertension, benign   . Fatty liver   . Hyperplasia of prostate   . 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 Other and unspecified hyperlipidemia   . Parkinson disease ([place])   . Schizophrenia [facility])    Past Surgical History:  Procedure Laterality Date  . COLONOSCOPY  02/13/08  . COLONOSCOPY N/A 07/10/2012   Procedure: COLONOSCOPY;  Surgeon: Rogene Houston, MD;  Location: [facility];  Service: Endoscopy;  Laterality: N/A;  225  . UPPER GASTROINTESTINAL ENDOSCOPY  EGD ED   09/09/2010  . URETHRAL DILATION  1965    Home Medications:  Current Facility-Administered Medications  Medication Dose Route Frequency Provider Last Rate Last Dose  . ceFAZolin (ANCEF) IVPB 2g/100 mL premix  2 g Intravenous 30 min Pre-Op Ladene Allocca, Candee Furbish, MD      . lactated ringers infusion   Intravenous Continuous Lenice Llamas, MD 50 mL/hr at 10/24/17 4098     Allergies:  Allergies  Allergen Reactions  . Amoxicillin     Dizzy and blurred vision  . Erythromycin   . Sulfamethoxazole-Trimethoprim Other (See Comments)    Extreme weakness/lethorgy  . Zocor [Simvastatin]     Family History  Problem Relation Age of Onset  . Heart failure Mother   . ALS Father   . Hyperlipidemia Sister   . Inflammatory bowel disease Paternal Grandfather    Social History:  reports that he has never smoked. He has never used smokeless tobacco. He reports that he does not drink alcohol or use drugs.  Review of Systems  Genitourinary: Positive for frequency and urgency.  All other systems reviewed and are negative.   Physical Exam:   Vital signs in last 24 hours: Temp:  [98.2 F (36.8 C)] 98.2 F (36.8 C) (08/05 0914) Pulse Rate:  [90] 90 (08/05 0914) Resp:  [21] 21 (08/05 0914) BP: (132)/(79) 132/79 (08/05 0914) SpO2:  [99 %] 99 % (08/05 0914) Weight:  [77.1 kg (170 lb)] 77.1 kg (170 lb) (08/05 0914) Physical Exam  Constitutional: He is oriented to person, place, and time. He appears well-developed and well-nourished.  HENT:  Head: Normocephalic and atraumatic.  Eyes: Pupils are equal, round, and reactive to light. EOM are normal.  Neck: Normal range of motion. No thyromegaly present.  Cardiovascular: Normal rate and regular rhythm.  Respiratory: Effort normal. No respiratory distress.  GI: Soft. He exhibits no distension.  Musculoskeletal: Normal range of motion. He exhibits no edema.  Neurological: He is alert and oriented to person, place, and time.  Skin: Skin is warm and dry.  Psychiatric: He has a normal mood and affect. His behavior is normal. Judgment and thought content normal.    Laboratory Data:  No results found for this or any previous visit (from the past 24 hour(s)). No results found for this or any previous visit (from the past 240 hour(s)). Creatinine: Recent Labs    10/17/17 1022  CREATININE 0.73   Baseline Creatinine: 0.7  Impression/Assessment:  57yo with BPH with LUTS, urinary urgency  Plan:  The risks/benefits/alternatives to Urolift was explained to  the patient and he understands and wishes to proceed with surgery  Nicolette Bang 10/24/2017, 9:21 AM

## 2017-10-24 NOTE — Anesthesia Postprocedure Evaluation (Signed)
Anesthesia Post Note  Patient: Mark Benjamin  Procedure(s) Performed: CYSTOSCOPY WITH INSERTION OF UROLIFT (N/A )  Patient location during evaluation: PACU Anesthesia Type: MAC Level of consciousness: awake and patient cooperative Pain management: pain level controlled Vital Signs Assessment: post-procedure vital signs reviewed and stable Respiratory status: spontaneous breathing, nonlabored ventilation, respiratory function stable and patient connected to nasal cannula oxygen Cardiovascular status: blood pressure returned to baseline Postop Assessment: no apparent nausea or vomiting Anesthetic complications: no     Last Vitals:  Vitals:   10/24/17 1045 10/24/17 1100  BP: 135/82 (!) 147/79  Pulse: 97 95  Resp: 16 20  Temp:    SpO2: 100% 100%    Last Pain:  Vitals:   10/24/17 1105  TempSrc:   PainSc: 4                  Urias Sheek J

## 2017-10-24 NOTE — Transfer of Care (Signed)
Immediate Anesthesia Transfer of Care Note  Patient: Mark Benjamin  Procedure(s) Performed: CYSTOSCOPY WITH INSERTION OF UROLIFT (N/A )  Patient Location: PACU  Anesthesia Type:MAC  Level of Consciousness: awake and patient cooperative  Airway & Oxygen Therapy: Patient Spontanous Breathing and Patient connected to nasal cannula oxygen  Post-op Assessment: Report given to RN and Post -op Vital signs reviewed and stable  Post vital signs: Reviewed and stable  Last Vitals:  Vitals Value Taken Time  BP    Temp    Pulse    Resp    SpO2      Last Pain:  Vitals:   10/24/17 0914  TempSrc: Oral  PainSc: 0-No pain      Patients Stated Pain Goal: 4 (63/01/60 1093)  Complications: No apparent anesthesia complications

## 2017-10-24 NOTE — Op Note (Signed)
   PREOPERATIVE DIAGNOSIS: Benign prostatic hypertrophy with bladder outlet obstruction and urinary urgency.  POSTOPERATIVE DIAGNOSIS: Benign prostatic hypertrophy with bladder outlet obstruction and urinary urgency.  PROCEDURE: Cystoscopy with implantation of UroLift devices, 4 implants.  SURGEON: Nicolette Bang, M.D.  ANESTHESIA: General  ANTIBIOTICS: ancef  SPECIMEN: None.  DRAINS: A 16-French Foley catheter.  BLOOD LOSS: Minimal.  COMPLICATIONS: None.  INDICATIONS:The Patient is an 57 year old white male with BPH and bladder outlet obstruction. He has failed medical therapy and has elected UroLift for definitive treatment.  FINDINGS OF PROCEDURE: He was taken to the operating room where a genral anesthetic was induced. He was placed in lithotomy position and was fitted with PAS hose. His perineum and genitalia were prepped with chlorhexidine, and he was draped in usual sterile fashion.  Cystoscopy was performed using the UroLift scope and 0 degree lens. Examination revealed a normal urethra. The external sphincter was intact. Prostatic urethra was approximately 4 cm in length with lateral lobe enlargement. There was also little bit of bladder neck elevation. Inspection of bladder revealed mild-to-moderate trabeculation with no tumors, stones, or inflammation. No cellules or diverticula were noted. Ureteral orifices were in their normal anatomic position effluxing clear urine.  After initial cystoscopy, the visual obturator was replaced with the first UroLift device. This was turned to the 9 o'clock position and pulled back to the veru and then slightly advanced. Pressure was then applied to the right lateral lobe and the UroLift device was deployed.  The second UroLift device was then inserted and applied to the left lateral lobe at 3 o'clock and deployed in the mid prostatic urethra. After this, there was still some apparent obstruction  closer to the bladder neck. So a second level of UroLift your left device was applied between the mid urethra and the proximal urethra providing further patency to the prostatic urethra. At this point, there was mild bleeding but the patient did have a spinal anesthetic. So it was thought that a Foley catheter was indicated. The scope was removed and a 16-French Foley catheter was inserted without difficulty. The balloon was filled with 10 mL sterile fluid, and the catheter was placed to straight drainage.  COMPLICATIONS: None   CONDITION: Stable, extubated, transferred to PACU  PLAN: The patient will be discharged home and followup in 2 days for a voiding trial.

## 2017-10-25 ENCOUNTER — Encounter (HOSPITAL_COMMUNITY): Payer: Self-pay | Admitting: Urology

## 2017-10-26 ENCOUNTER — Ambulatory Visit (INDEPENDENT_AMBULATORY_CARE_PROVIDER_SITE_OTHER): Payer: Medicare Other | Admitting: Urology

## 2017-10-26 DIAGNOSIS — N401 Enlarged prostate with lower urinary tract symptoms: Secondary | ICD-10-CM

## 2017-10-28 DIAGNOSIS — I10 Essential (primary) hypertension: Secondary | ICD-10-CM | POA: Diagnosis not present

## 2017-10-28 DIAGNOSIS — R269 Unspecified abnormalities of gait and mobility: Secondary | ICD-10-CM | POA: Diagnosis not present

## 2017-10-28 DIAGNOSIS — M6281 Muscle weakness (generalized): Secondary | ICD-10-CM | POA: Diagnosis not present

## 2017-10-28 DIAGNOSIS — M4802 Spinal stenosis, cervical region: Secondary | ICD-10-CM | POA: Diagnosis not present

## 2017-10-28 DIAGNOSIS — G20 Parkinson's disease: Secondary | ICD-10-CM | POA: Diagnosis not present

## 2017-10-28 DIAGNOSIS — F209 Schizophrenia, unspecified: Secondary | ICD-10-CM | POA: Diagnosis not present

## 2017-10-31 DIAGNOSIS — F209 Schizophrenia, unspecified: Secondary | ICD-10-CM | POA: Diagnosis not present

## 2017-10-31 DIAGNOSIS — R269 Unspecified abnormalities of gait and mobility: Secondary | ICD-10-CM | POA: Diagnosis not present

## 2017-10-31 DIAGNOSIS — G20 Parkinson's disease: Secondary | ICD-10-CM | POA: Diagnosis not present

## 2017-11-02 DIAGNOSIS — R269 Unspecified abnormalities of gait and mobility: Secondary | ICD-10-CM | POA: Diagnosis not present

## 2017-11-02 DIAGNOSIS — F209 Schizophrenia, unspecified: Secondary | ICD-10-CM | POA: Diagnosis not present

## 2017-11-02 DIAGNOSIS — G20 Parkinson's disease: Secondary | ICD-10-CM | POA: Diagnosis not present

## 2017-11-07 DIAGNOSIS — G20 Parkinson's disease: Secondary | ICD-10-CM | POA: Diagnosis not present

## 2017-11-07 DIAGNOSIS — F209 Schizophrenia, unspecified: Secondary | ICD-10-CM | POA: Diagnosis not present

## 2017-11-07 DIAGNOSIS — R269 Unspecified abnormalities of gait and mobility: Secondary | ICD-10-CM | POA: Diagnosis not present

## 2017-11-09 ENCOUNTER — Ambulatory Visit (INDEPENDENT_AMBULATORY_CARE_PROVIDER_SITE_OTHER): Payer: Medicare Other | Admitting: Urology

## 2017-11-09 DIAGNOSIS — R3914 Feeling of incomplete bladder emptying: Secondary | ICD-10-CM | POA: Diagnosis not present

## 2017-11-09 DIAGNOSIS — N401 Enlarged prostate with lower urinary tract symptoms: Secondary | ICD-10-CM

## 2017-11-11 DIAGNOSIS — G20 Parkinson's disease: Secondary | ICD-10-CM | POA: Diagnosis not present

## 2017-11-11 DIAGNOSIS — R269 Unspecified abnormalities of gait and mobility: Secondary | ICD-10-CM | POA: Diagnosis not present

## 2017-11-11 DIAGNOSIS — F209 Schizophrenia, unspecified: Secondary | ICD-10-CM | POA: Diagnosis not present

## 2017-11-14 DIAGNOSIS — G20 Parkinson's disease: Secondary | ICD-10-CM | POA: Diagnosis not present

## 2017-11-14 DIAGNOSIS — F209 Schizophrenia, unspecified: Secondary | ICD-10-CM | POA: Diagnosis not present

## 2017-11-14 DIAGNOSIS — M6281 Muscle weakness (generalized): Secondary | ICD-10-CM | POA: Diagnosis not present

## 2017-11-14 DIAGNOSIS — R269 Unspecified abnormalities of gait and mobility: Secondary | ICD-10-CM | POA: Diagnosis not present

## 2017-11-15 DIAGNOSIS — F209 Schizophrenia, unspecified: Secondary | ICD-10-CM | POA: Diagnosis not present

## 2017-11-15 DIAGNOSIS — G20 Parkinson's disease: Secondary | ICD-10-CM | POA: Diagnosis not present

## 2017-11-15 DIAGNOSIS — M6281 Muscle weakness (generalized): Secondary | ICD-10-CM | POA: Diagnosis not present

## 2017-11-15 DIAGNOSIS — R269 Unspecified abnormalities of gait and mobility: Secondary | ICD-10-CM | POA: Diagnosis not present

## 2017-11-17 DIAGNOSIS — G20 Parkinson's disease: Secondary | ICD-10-CM | POA: Diagnosis not present

## 2017-11-17 DIAGNOSIS — R269 Unspecified abnormalities of gait and mobility: Secondary | ICD-10-CM | POA: Diagnosis not present

## 2017-11-17 DIAGNOSIS — M6281 Muscle weakness (generalized): Secondary | ICD-10-CM | POA: Diagnosis not present

## 2017-11-17 DIAGNOSIS — F209 Schizophrenia, unspecified: Secondary | ICD-10-CM | POA: Diagnosis not present

## 2017-11-18 DIAGNOSIS — R269 Unspecified abnormalities of gait and mobility: Secondary | ICD-10-CM | POA: Diagnosis not present

## 2017-11-18 DIAGNOSIS — G20 Parkinson's disease: Secondary | ICD-10-CM | POA: Diagnosis not present

## 2017-11-18 DIAGNOSIS — M6281 Muscle weakness (generalized): Secondary | ICD-10-CM | POA: Diagnosis not present

## 2017-11-18 DIAGNOSIS — F209 Schizophrenia, unspecified: Secondary | ICD-10-CM | POA: Diagnosis not present

## 2017-11-22 DIAGNOSIS — F209 Schizophrenia, unspecified: Secondary | ICD-10-CM | POA: Diagnosis not present

## 2017-11-22 DIAGNOSIS — R269 Unspecified abnormalities of gait and mobility: Secondary | ICD-10-CM | POA: Diagnosis not present

## 2017-11-22 DIAGNOSIS — G20 Parkinson's disease: Secondary | ICD-10-CM | POA: Diagnosis not present

## 2017-11-22 DIAGNOSIS — M6281 Muscle weakness (generalized): Secondary | ICD-10-CM | POA: Diagnosis not present

## 2017-11-24 DIAGNOSIS — M6281 Muscle weakness (generalized): Secondary | ICD-10-CM | POA: Diagnosis not present

## 2017-11-24 DIAGNOSIS — F209 Schizophrenia, unspecified: Secondary | ICD-10-CM | POA: Diagnosis not present

## 2017-11-24 DIAGNOSIS — G20 Parkinson's disease: Secondary | ICD-10-CM | POA: Diagnosis not present

## 2017-11-24 DIAGNOSIS — R269 Unspecified abnormalities of gait and mobility: Secondary | ICD-10-CM | POA: Diagnosis not present

## 2017-11-25 DIAGNOSIS — R269 Unspecified abnormalities of gait and mobility: Secondary | ICD-10-CM | POA: Diagnosis not present

## 2017-11-25 DIAGNOSIS — G20 Parkinson's disease: Secondary | ICD-10-CM | POA: Diagnosis not present

## 2017-11-25 DIAGNOSIS — M6281 Muscle weakness (generalized): Secondary | ICD-10-CM | POA: Diagnosis not present

## 2017-11-25 DIAGNOSIS — F209 Schizophrenia, unspecified: Secondary | ICD-10-CM | POA: Diagnosis not present

## 2017-11-28 ENCOUNTER — Other Ambulatory Visit: Payer: Medicare Other

## 2017-11-28 DIAGNOSIS — R269 Unspecified abnormalities of gait and mobility: Secondary | ICD-10-CM | POA: Diagnosis not present

## 2017-11-28 DIAGNOSIS — F209 Schizophrenia, unspecified: Secondary | ICD-10-CM | POA: Diagnosis not present

## 2017-11-28 DIAGNOSIS — E7800 Pure hypercholesterolemia, unspecified: Secondary | ICD-10-CM | POA: Diagnosis not present

## 2017-11-28 DIAGNOSIS — G20 Parkinson's disease: Secondary | ICD-10-CM | POA: Diagnosis not present

## 2017-11-28 DIAGNOSIS — I10 Essential (primary) hypertension: Secondary | ICD-10-CM

## 2017-11-28 DIAGNOSIS — E559 Vitamin D deficiency, unspecified: Secondary | ICD-10-CM

## 2017-11-28 DIAGNOSIS — R300 Dysuria: Secondary | ICD-10-CM

## 2017-11-28 DIAGNOSIS — R710 Precipitous drop in hematocrit: Secondary | ICD-10-CM

## 2017-11-28 DIAGNOSIS — M6281 Muscle weakness (generalized): Secondary | ICD-10-CM | POA: Diagnosis not present

## 2017-11-28 LAB — MICROSCOPIC EXAMINATION
BACTERIA UA: NONE SEEN
EPITHELIAL CELLS (NON RENAL): NONE SEEN /HPF (ref 0–10)
RBC MICROSCOPIC, UA: NONE SEEN /HPF (ref 0–2)
RENAL EPITHEL UA: NONE SEEN /HPF

## 2017-11-28 LAB — URINALYSIS, COMPLETE
Bilirubin, UA: NEGATIVE
Glucose, UA: NEGATIVE
Ketones, UA: NEGATIVE
Leukocytes, UA: NEGATIVE
NITRITE UA: NEGATIVE
PH UA: 7 (ref 5.0–7.5)
Protein, UA: NEGATIVE
RBC, UA: NEGATIVE
Specific Gravity, UA: 1.01 (ref 1.005–1.030)
UUROB: 0.2 mg/dL (ref 0.2–1.0)

## 2017-11-29 DIAGNOSIS — R270 Ataxia, unspecified: Secondary | ICD-10-CM | POA: Diagnosis not present

## 2017-11-29 DIAGNOSIS — M79675 Pain in left toe(s): Secondary | ICD-10-CM | POA: Diagnosis not present

## 2017-11-29 DIAGNOSIS — M2012 Hallux valgus (acquired), left foot: Secondary | ICD-10-CM | POA: Diagnosis not present

## 2017-11-29 DIAGNOSIS — B351 Tinea unguium: Secondary | ICD-10-CM | POA: Diagnosis not present

## 2017-11-29 DIAGNOSIS — G20 Parkinson's disease: Secondary | ICD-10-CM | POA: Diagnosis not present

## 2017-11-29 DIAGNOSIS — G629 Polyneuropathy, unspecified: Secondary | ICD-10-CM | POA: Diagnosis not present

## 2017-11-29 DIAGNOSIS — M6281 Muscle weakness (generalized): Secondary | ICD-10-CM | POA: Diagnosis not present

## 2017-11-29 LAB — URINE CULTURE

## 2017-11-29 LAB — LIPID PANEL
Chol/HDL Ratio: 3.7 ratio (ref 0.0–5.0)
Cholesterol, Total: 194 mg/dL (ref 100–199)
HDL: 53 mg/dL (ref >39)
LDL CALC: 130 mg/dL — AB (ref 0–99)
TRIGLYCERIDES: 57 mg/dL (ref 0–149)
VLDL Cholesterol Cal: 11 mg/dL (ref 5–40)

## 2017-11-29 LAB — CBC WITH DIFFERENTIAL/PLATELET
Basophils Absolute: 0 10*3/uL (ref 0.0–0.2)
Basos: 1 %
EOS (ABSOLUTE): 0.2 10*3/uL (ref 0.0–0.4)
EOS: 4 %
HEMATOCRIT: 40.6 % (ref 37.5–51.0)
HEMOGLOBIN: 13.5 g/dL (ref 13.0–17.7)
IMMATURE GRANS (ABS): 0 10*3/uL (ref 0.0–0.1)
IMMATURE GRANULOCYTES: 0 %
LYMPHS ABS: 1.8 10*3/uL (ref 0.7–3.1)
LYMPHS: 36 %
MCH: 29.7 pg (ref 26.6–33.0)
MCHC: 33.3 g/dL (ref 31.5–35.7)
MCV: 89 fL (ref 79–97)
MONOCYTES: 10 %
Monocytes Absolute: 0.5 10*3/uL (ref 0.1–0.9)
NEUTROS PCT: 49 %
Neutrophils Absolute: 2.5 10*3/uL (ref 1.4–7.0)
Platelets: 228 10*3/uL (ref 150–450)
RBC: 4.55 x10E6/uL (ref 4.14–5.80)
RDW: 13.1 % (ref 12.3–15.4)
WBC: 5.1 10*3/uL (ref 3.4–10.8)

## 2017-11-29 LAB — HEPATIC FUNCTION PANEL
ALT: 11 IU/L (ref 0–44)
AST: 19 IU/L (ref 0–40)
Albumin: 4.5 g/dL (ref 3.5–5.5)
Alkaline Phosphatase: 69 IU/L (ref 39–117)
BILIRUBIN TOTAL: 0.8 mg/dL (ref 0.0–1.2)
Bilirubin, Direct: 0.18 mg/dL (ref 0.00–0.40)
Total Protein: 6.9 g/dL (ref 6.0–8.5)

## 2017-11-29 LAB — BMP8+EGFR
BUN / CREAT RATIO: 8 — AB (ref 9–20)
BUN: 7 mg/dL (ref 6–24)
CALCIUM: 9.8 mg/dL (ref 8.7–10.2)
CO2: 26 mmol/L (ref 20–29)
Chloride: 98 mmol/L (ref 96–106)
Creatinine, Ser: 0.93 mg/dL (ref 0.76–1.27)
GFR calc non Af Amer: 91 mL/min/{1.73_m2} (ref >59)
GFR, EST AFRICAN AMERICAN: 105 mL/min/{1.73_m2} (ref >59)
Glucose: 81 mg/dL (ref 65–99)
Potassium: 3.7 mmol/L (ref 3.5–5.2)
Sodium: 139 mmol/L (ref 134–144)

## 2017-11-29 LAB — VITAMIN D 25 HYDROXY (VIT D DEFICIENCY, FRACTURES): VIT D 25 HYDROXY: 57.7 ng/mL (ref 30.0–100.0)

## 2017-11-30 ENCOUNTER — Telehealth: Payer: Self-pay | Admitting: Family Medicine

## 2017-11-30 ENCOUNTER — Telehealth: Payer: Self-pay | Admitting: *Deleted

## 2017-11-30 MED ORDER — ROSUVASTATIN CALCIUM 5 MG PO TABS: 2.5000 mg | ORAL_TABLET | ORAL | 3 refills | Status: AC

## 2017-11-30 NOTE — Telephone Encounter (Signed)
Results faxed to [facility] [phone]

## 2017-11-30 NOTE — Telephone Encounter (Signed)
recommendations given to NP regarding pt's lab results Order faxed to [phone]

## 2017-12-01 ENCOUNTER — Other Ambulatory Visit: Payer: Self-pay | Admitting: *Deleted

## 2017-12-01 DIAGNOSIS — E7800 Pure hypercholesterolemia, unspecified: Secondary | ICD-10-CM

## 2017-12-01 DIAGNOSIS — M6281 Muscle weakness (generalized): Secondary | ICD-10-CM | POA: Diagnosis not present

## 2017-12-01 DIAGNOSIS — G20 Parkinson's disease: Secondary | ICD-10-CM | POA: Diagnosis not present

## 2017-12-01 DIAGNOSIS — R270 Ataxia, unspecified: Secondary | ICD-10-CM | POA: Diagnosis not present

## 2017-12-02 DIAGNOSIS — R269 Unspecified abnormalities of gait and mobility: Secondary | ICD-10-CM | POA: Diagnosis not present

## 2017-12-02 DIAGNOSIS — M6281 Muscle weakness (generalized): Secondary | ICD-10-CM | POA: Diagnosis not present

## 2017-12-02 DIAGNOSIS — F209 Schizophrenia, unspecified: Secondary | ICD-10-CM | POA: Diagnosis not present

## 2017-12-02 DIAGNOSIS — G20 Parkinson's disease: Secondary | ICD-10-CM | POA: Diagnosis not present

## 2017-12-06 DIAGNOSIS — R270 Ataxia, unspecified: Secondary | ICD-10-CM | POA: Diagnosis not present

## 2017-12-06 DIAGNOSIS — G20 Parkinson's disease: Secondary | ICD-10-CM | POA: Diagnosis not present

## 2017-12-06 DIAGNOSIS — R269 Unspecified abnormalities of gait and mobility: Secondary | ICD-10-CM | POA: Diagnosis not present

## 2017-12-06 DIAGNOSIS — F209 Schizophrenia, unspecified: Secondary | ICD-10-CM | POA: Diagnosis not present

## 2017-12-06 DIAGNOSIS — M6281 Muscle weakness (generalized): Secondary | ICD-10-CM | POA: Diagnosis not present

## 2017-12-08 DIAGNOSIS — G20 Parkinson's disease: Secondary | ICD-10-CM | POA: Diagnosis not present

## 2017-12-08 DIAGNOSIS — M6281 Muscle weakness (generalized): Secondary | ICD-10-CM | POA: Diagnosis not present

## 2017-12-08 DIAGNOSIS — R270 Ataxia, unspecified: Secondary | ICD-10-CM | POA: Diagnosis not present

## 2017-12-13 DIAGNOSIS — R270 Ataxia, unspecified: Secondary | ICD-10-CM | POA: Diagnosis not present

## 2017-12-13 DIAGNOSIS — G20 Parkinson's disease: Secondary | ICD-10-CM | POA: Diagnosis not present

## 2017-12-13 DIAGNOSIS — M6281 Muscle weakness (generalized): Secondary | ICD-10-CM | POA: Diagnosis not present

## 2017-12-15 DIAGNOSIS — G20 Parkinson's disease: Secondary | ICD-10-CM | POA: Diagnosis not present

## 2017-12-15 DIAGNOSIS — M6281 Muscle weakness (generalized): Secondary | ICD-10-CM | POA: Diagnosis not present

## 2017-12-15 DIAGNOSIS — R270 Ataxia, unspecified: Secondary | ICD-10-CM | POA: Diagnosis not present

## 2017-12-20 DIAGNOSIS — G20 Parkinson's disease: Secondary | ICD-10-CM | POA: Diagnosis not present

## 2017-12-20 DIAGNOSIS — R270 Ataxia, unspecified: Secondary | ICD-10-CM | POA: Diagnosis not present

## 2017-12-20 DIAGNOSIS — M6281 Muscle weakness (generalized): Secondary | ICD-10-CM | POA: Diagnosis not present

## 2017-12-22 DIAGNOSIS — R270 Ataxia, unspecified: Secondary | ICD-10-CM | POA: Diagnosis not present

## 2017-12-22 DIAGNOSIS — G20 Parkinson's disease: Secondary | ICD-10-CM | POA: Diagnosis not present

## 2017-12-22 DIAGNOSIS — M6281 Muscle weakness (generalized): Secondary | ICD-10-CM | POA: Diagnosis not present

## 2017-12-27 DIAGNOSIS — R270 Ataxia, unspecified: Secondary | ICD-10-CM | POA: Diagnosis not present

## 2017-12-27 DIAGNOSIS — M6281 Muscle weakness (generalized): Secondary | ICD-10-CM | POA: Diagnosis not present

## 2017-12-27 DIAGNOSIS — Z23 Encounter for immunization: Secondary | ICD-10-CM | POA: Diagnosis not present

## 2017-12-27 DIAGNOSIS — G20 Parkinson's disease: Secondary | ICD-10-CM | POA: Diagnosis not present

## 2017-12-28 DIAGNOSIS — M6281 Muscle weakness (generalized): Secondary | ICD-10-CM | POA: Diagnosis not present

## 2017-12-28 DIAGNOSIS — G20 Parkinson's disease: Secondary | ICD-10-CM | POA: Diagnosis not present

## 2017-12-28 DIAGNOSIS — R270 Ataxia, unspecified: Secondary | ICD-10-CM | POA: Diagnosis not present

## 2018-01-05 DIAGNOSIS — G20 Parkinson's disease: Secondary | ICD-10-CM | POA: Diagnosis not present

## 2018-01-05 DIAGNOSIS — R270 Ataxia, unspecified: Secondary | ICD-10-CM | POA: Diagnosis not present

## 2018-01-05 DIAGNOSIS — M6281 Muscle weakness (generalized): Secondary | ICD-10-CM | POA: Diagnosis not present

## 2018-01-06 DIAGNOSIS — G20 Parkinson's disease: Secondary | ICD-10-CM | POA: Diagnosis not present

## 2018-01-06 DIAGNOSIS — R270 Ataxia, unspecified: Secondary | ICD-10-CM | POA: Diagnosis not present

## 2018-01-06 DIAGNOSIS — M6281 Muscle weakness (generalized): Secondary | ICD-10-CM | POA: Diagnosis not present

## 2018-01-09 DIAGNOSIS — F29 Unspecified psychosis not due to a substance or known physiological condition: Secondary | ICD-10-CM | POA: Diagnosis not present

## 2018-01-11 ENCOUNTER — Ambulatory Visit (INDEPENDENT_AMBULATORY_CARE_PROVIDER_SITE_OTHER): Payer: Medicare Other | Admitting: Urology

## 2018-01-11 DIAGNOSIS — N401 Enlarged prostate with lower urinary tract symptoms: Secondary | ICD-10-CM

## 2018-01-11 DIAGNOSIS — R3914 Feeling of incomplete bladder emptying: Secondary | ICD-10-CM

## 2018-01-27 ENCOUNTER — Other Ambulatory Visit: Payer: Medicare Other

## 2018-01-27 DIAGNOSIS — E7800 Pure hypercholesterolemia, unspecified: Secondary | ICD-10-CM | POA: Diagnosis not present

## 2018-01-28 LAB — HEPATIC FUNCTION PANEL
ALT: 12 IU/L (ref 0–44)
AST: 17 IU/L (ref 0–40)
Albumin: 4.6 g/dL (ref 3.5–5.5)
Alkaline Phosphatase: 57 IU/L (ref 39–117)
Bilirubin Total: 1.3 mg/dL — ABNORMAL HIGH (ref 0.0–1.2)
Bilirubin, Direct: 0.28 mg/dL (ref 0.00–0.40)
Total Protein: 6.8 g/dL (ref 6.0–8.5)

## 2018-01-28 LAB — LIPID PANEL
CHOL/HDL RATIO: 3 ratio (ref 0.0–5.0)
CHOLESTEROL TOTAL: 154 mg/dL (ref 100–199)
HDL: 51 mg/dL (ref >39)
LDL CALC: 92 mg/dL (ref 0–99)
TRIGLYCERIDES: 56 mg/dL (ref 0–149)
VLDL Cholesterol Cal: 11 mg/dL (ref 5–40)

## 2018-01-30 ENCOUNTER — Encounter: Payer: Self-pay | Admitting: *Deleted

## 2018-02-14 ENCOUNTER — Telehealth: Payer: Self-pay | Admitting: *Deleted

## 2018-02-14 NOTE — Telephone Encounter (Signed)
Encounter opened in error

## 2018-02-24 ENCOUNTER — Telehealth: Payer: Self-pay | Admitting: Family Medicine

## 2018-02-24 NOTE — Telephone Encounter (Signed)
This was FYI from

## 2018-03-06 ENCOUNTER — Telehealth: Payer: Self-pay | Admitting: Family Medicine

## 2018-03-06 NOTE — Telephone Encounter (Signed)
Mark Benjamin from [facility] is calling for the pt, pt states that his Hands and legs are feeling heavy not following commands, generalized weakness

## 2018-03-06 NOTE — Telephone Encounter (Signed)
Called NP aware pt should be evaluated in office or ER - verbalizes understanding

## 2018-03-09 DIAGNOSIS — B351 Tinea unguium: Secondary | ICD-10-CM | POA: Diagnosis not present

## 2018-04-04 DIAGNOSIS — G20 Parkinson's disease: Secondary | ICD-10-CM | POA: Diagnosis not present

## 2018-04-04 DIAGNOSIS — R269 Unspecified abnormalities of gait and mobility: Secondary | ICD-10-CM | POA: Diagnosis not present

## 2018-04-04 DIAGNOSIS — F209 Schizophrenia, unspecified: Secondary | ICD-10-CM | POA: Diagnosis not present

## 2018-04-04 DIAGNOSIS — M6281 Muscle weakness (generalized): Secondary | ICD-10-CM | POA: Diagnosis not present

## 2018-04-07 DIAGNOSIS — R269 Unspecified abnormalities of gait and mobility: Secondary | ICD-10-CM | POA: Diagnosis not present

## 2018-04-07 DIAGNOSIS — M6281 Muscle weakness (generalized): Secondary | ICD-10-CM | POA: Diagnosis not present

## 2018-04-07 DIAGNOSIS — F209 Schizophrenia, unspecified: Secondary | ICD-10-CM | POA: Diagnosis not present

## 2018-04-07 DIAGNOSIS — G20 Parkinson's disease: Secondary | ICD-10-CM | POA: Diagnosis not present

## 2018-04-11 DIAGNOSIS — M6281 Muscle weakness (generalized): Secondary | ICD-10-CM | POA: Diagnosis not present

## 2018-04-11 DIAGNOSIS — F209 Schizophrenia, unspecified: Secondary | ICD-10-CM | POA: Diagnosis not present

## 2018-04-11 DIAGNOSIS — G20 Parkinson's disease: Secondary | ICD-10-CM | POA: Diagnosis not present

## 2018-04-11 DIAGNOSIS — R269 Unspecified abnormalities of gait and mobility: Secondary | ICD-10-CM | POA: Diagnosis not present

## 2018-04-13 DIAGNOSIS — R269 Unspecified abnormalities of gait and mobility: Secondary | ICD-10-CM | POA: Diagnosis not present

## 2018-04-13 DIAGNOSIS — G20 Parkinson's disease: Secondary | ICD-10-CM | POA: Diagnosis not present

## 2018-04-13 DIAGNOSIS — F209 Schizophrenia, unspecified: Secondary | ICD-10-CM | POA: Diagnosis not present

## 2018-04-13 DIAGNOSIS — M6281 Muscle weakness (generalized): Secondary | ICD-10-CM | POA: Diagnosis not present

## 2018-04-18 DIAGNOSIS — G20 Parkinson's disease: Secondary | ICD-10-CM | POA: Diagnosis not present

## 2018-04-18 DIAGNOSIS — F209 Schizophrenia, unspecified: Secondary | ICD-10-CM | POA: Diagnosis not present

## 2018-04-18 DIAGNOSIS — R269 Unspecified abnormalities of gait and mobility: Secondary | ICD-10-CM | POA: Diagnosis not present

## 2018-04-18 DIAGNOSIS — M6281 Muscle weakness (generalized): Secondary | ICD-10-CM | POA: Diagnosis not present

## 2018-04-20 DIAGNOSIS — M6281 Muscle weakness (generalized): Secondary | ICD-10-CM | POA: Diagnosis not present

## 2018-04-20 DIAGNOSIS — G20 Parkinson's disease: Secondary | ICD-10-CM | POA: Diagnosis not present

## 2018-04-20 DIAGNOSIS — F209 Schizophrenia, unspecified: Secondary | ICD-10-CM | POA: Diagnosis not present

## 2018-04-20 DIAGNOSIS — R269 Unspecified abnormalities of gait and mobility: Secondary | ICD-10-CM | POA: Diagnosis not present

## 2018-04-24 DIAGNOSIS — F29 Unspecified psychosis not due to a substance or known physiological condition: Secondary | ICD-10-CM | POA: Diagnosis not present

## 2018-04-25 DIAGNOSIS — G20 Parkinson's disease: Secondary | ICD-10-CM | POA: Diagnosis not present

## 2018-04-25 DIAGNOSIS — F209 Schizophrenia, unspecified: Secondary | ICD-10-CM | POA: Diagnosis not present

## 2018-04-25 DIAGNOSIS — R269 Unspecified abnormalities of gait and mobility: Secondary | ICD-10-CM | POA: Diagnosis not present

## 2018-04-25 DIAGNOSIS — M6281 Muscle weakness (generalized): Secondary | ICD-10-CM | POA: Diagnosis not present

## 2018-04-27 DIAGNOSIS — G20 Parkinson's disease: Secondary | ICD-10-CM | POA: Diagnosis not present

## 2018-04-27 DIAGNOSIS — R269 Unspecified abnormalities of gait and mobility: Secondary | ICD-10-CM | POA: Diagnosis not present

## 2018-04-27 DIAGNOSIS — M6281 Muscle weakness (generalized): Secondary | ICD-10-CM | POA: Diagnosis not present

## 2018-04-27 DIAGNOSIS — F209 Schizophrenia, unspecified: Secondary | ICD-10-CM | POA: Diagnosis not present

## 2018-05-02 DIAGNOSIS — F209 Schizophrenia, unspecified: Secondary | ICD-10-CM | POA: Diagnosis not present

## 2018-05-02 DIAGNOSIS — M6281 Muscle weakness (generalized): Secondary | ICD-10-CM | POA: Diagnosis not present

## 2018-05-02 DIAGNOSIS — G20 Parkinson's disease: Secondary | ICD-10-CM | POA: Diagnosis not present

## 2018-05-02 DIAGNOSIS — R269 Unspecified abnormalities of gait and mobility: Secondary | ICD-10-CM | POA: Diagnosis not present

## 2018-05-04 DIAGNOSIS — M6281 Muscle weakness (generalized): Secondary | ICD-10-CM | POA: Diagnosis not present

## 2018-05-04 DIAGNOSIS — F209 Schizophrenia, unspecified: Secondary | ICD-10-CM | POA: Diagnosis not present

## 2018-05-04 DIAGNOSIS — R269 Unspecified abnormalities of gait and mobility: Secondary | ICD-10-CM | POA: Diagnosis not present

## 2018-05-04 DIAGNOSIS — G20 Parkinson's disease: Secondary | ICD-10-CM | POA: Diagnosis not present

## 2018-05-08 ENCOUNTER — Ambulatory Visit (INDEPENDENT_AMBULATORY_CARE_PROVIDER_SITE_OTHER): Payer: Medicare Other | Admitting: Diagnostic Neuroimaging

## 2018-05-08 ENCOUNTER — Encounter: Payer: Self-pay | Admitting: Diagnostic Neuroimaging

## 2018-05-08 ENCOUNTER — Encounter

## 2018-05-08 VITALS — BP 107/64 | HR 87 | Ht 73.0 in | Wt 170.0 lb

## 2018-05-08 DIAGNOSIS — M4802 Spinal stenosis, cervical region: Secondary | ICD-10-CM

## 2018-05-08 DIAGNOSIS — G20 Parkinson's disease: Secondary | ICD-10-CM

## 2018-05-08 NOTE — Progress Notes (Signed)
[facility]  PATIENT: Mark Benjamin DOB: 07/13/1960  REFERRING CLINICIAN: Alphonse Guild HISTORY FROM: patient and sister REASON FOR VISIT: follow up   HISTORICAL  CHIEF COMPLAINT:  Chief Complaint  Patient presents with  . Follow-up    9 month follow up. Rm 6. Sister present. Patient mentioned that his weakness in his body has gotten worse. He stated that he is having some stiffness in his joints especially in his left hand.     HISTORY OF PRESENT ILLNESS:   UPDATE (05/08/18, VRP): Since last visit, having more generalized weakness, malaise, and lack of interest in activities. Symptoms are progressive. More left hand stiffness and dystonia. No alleviating or aggravating factors. Tolerating meds.    UPDATE (08/01/17, VRP): Since last visit, doing well, with improve gait and walking. Tremor stable. Tolerating meds. No alleviating or aggravating factors. No delusions / hallucinations.  UPDATE (01/31/17, VRP): Since last visit, doing well. Tolerating carb/levo. No alleviating or aggravating factors.   UPDATE 09/29/16: Since last visit, tremors and coordination have worsened. Balance poor. Had a minor fall 3 weeks ago. Sister is looking at options for PACE vs assisted living. She is concerned about his safety to live alone. He is no longer driving. They were not able to afford [facility] ($14k out of pocket).   UPDATE 06/15/16: Since last visit, tremor, slowness, stiffness continue. Here to look at other options for treatment. Schizophrenia stable. More balance and coordination issues. Patient lives alone. Driving short distances.   UPDATE 09/22/15 (VRP): Since last visit, tremor and parkinsonism sxs continue. Saw Dr. Olga Millers, but was told that his spine issues are not amenable to surgical treatment.   UPDATE 06/04/14 (VRP): Since last visit, patient has transitioned from thioridazine to risperdal and cogentin and depakote. Left arm symptoms are stable / slightly improved. Patient  not interested in surgical treatment of c-spine surg; also, was told by neurosurg that his neck issues do not need surg decompression.  UPDATE 08/10/13 (LL): Patient states that he was evaluated by Dr. Vertell Limber for cervical stenosis and was told that there was nothing seen on Myelogram that needed surgery. His Thioridazine dose is now 100 mg. Symptoms are stable. He was advised by PCP to cut out artificial sweeteners to see if that changed his symptoms; he did so 2 weeks ago. He complains of mild left leg weakness.  UPDATE 02/26/13 (VRP): Since last visit patient had MRI of the brain and cervical spine which shows moderate spinal stenosis at C5-6 level with severe bilateral foraminal stenosis. Also mild spinal stenosis and moderate right foraminal stenosis at C6-7. No cord signal abnormalities. Since last visit thioridazine dosing has been reduced. Overall his symptoms are stable to slightly progressed.  PRIOR HPI (11/21/12, VRP): 58 year old left-handed male with history of diabetes, hypercholesterolemia, schizophrenia, on thioridazine since 1986, here for evaluation of left arm weakness and numbness since March 2014. Patient reports gradual onset, progressive numbness and weakness and slowness of his left arm. He denies any symptoms in his right arm or legs. No facial droop or facial numbness. He has noticed some slurred speech over the past one week. Patient is left-handed and has noticed difficulty with brushing his teeth with his left hand, handwriting and other fine movements. Patient's psychiatrist reduced thioridazine dose from 300 mg at bedtime down to 200 mg at bedtime last month, out of consideration of the patient's presenting symptoms as a potential side effect of medication. Since reducing the dose, no change in symptoms.  REVIEW OF SYSTEMS: Full 14 system review of systems performed and negative: depression decr concentation.    ALLERGIES: Allergies  Allergen Reactions  . Amoxicillin      Dizzy and blurred vision  . Erythromycin   . Sulfamethoxazole-Trimethoprim Other (See Comments)    Extreme weakness/lethorgy  . Zocor [Simvastatin]     HOME MEDICATIONS: Outpatient Medications Prior to Visit  Medication Sig Dispense Refill  . acetaminophen (TYLENOL) 500 MG tablet Take 1,000 mg by mouth every 6 (six) hours as needed (for fever.).    Marland Kitchen benztropine (COGENTIN) 1 MG tablet Take 1 mg by mouth 2 (two) times daily.     . carbidopa-levodopa (SINEMET IR) 25-100 MG tablet Take 1 tab at 8am, 1 tab at 2pm, 1.5 tabs at bedtime. (Patient taking differently: Take 1-1.5 tablets by mouth See admin instructions. TAKE 1 TABLET BY MOUTH AT 0800, TAKE 1 TABLET BY MOUTH AT 1400, & TAKE 1.5 TABLETS BY MOUTH AT BEDTIME.) 120 tablet 12  . cholecalciferol (VITAMIN D) 1000 units tablet Take 3,000 Units by mouth daily.    . divalproex (DEPAKOTE) 250 MG DR tablet Take 250 mg by mouth 2 (two) times daily.     Marland Kitchen docusate sodium (COLACE) 100 MG capsule Take 100 mg by mouth daily.     . fosinopril-hydrochlorothiazide (MONOPRIL-HCT) 10-12.5 MG tablet Take 1 tablet by mouth daily.    Marland Kitchen loperamide (IMODIUM) 2 MG capsule Take 2 mg by mouth See admin instructions. Give 1 capsule (2 mg) by mouth with each loose stool up to 8 doses in 24 hr    . magnesium hydroxide (MILK OF MAGNESIA) 400 MG/5ML suspension Take 30 mLs by mouth daily as needed (for constipation.).    Marland Kitchen Multiple Vitamin (MULTIVITAMIN WITH MINERALS) TABS tablet Take 1 tablet by mouth daily.    Vladimir Faster Glycol-Propyl Glycol (LUBRICANT EYE DROPS) 0.4-0.3 % SOLN Place 1 drop into both eyes 4 (four) times daily as needed (for dry/irritated eyes (wait 3-5 min between different eye drops)).     . Probiotic CAPS Take 1 capsule by mouth 2 (two) times daily. (Patient taking differently: Take 1 capsule by mouth daily. ) 60 capsule 5  . risperiDONE (RISPERDAL) 1 MG tablet Take 1 mg by mouth daily.     . risperiDONE (RISPERDAL) 2 MG tablet Take 2 mg by mouth at  bedtime.    . rosuvastatin (CRESTOR) 5 MG tablet Take 0.5 tablets (2.5 mg total) by mouth every other day. Take 1/2 tablet on Monday, Wednesday and Friday 45 tablet 3  . Sodium Fluoride (PREVIDENT 5000 BOOSTER) 1.1 % PSTE Place 1 application onto teeth at bedtime.    . traMADol (ULTRAM) 50 MG tablet Take 1 tablet (50 mg total) by mouth every 6 (six) hours as needed. 30 tablet 0  . Cranberry (AZO CRANBERRY GUMMIES) 500 MG CHEW Chew 500 mg by mouth daily.      No facility-administered medications prior to visit.     PAST MEDICAL HISTORY: Past Medical History:  Diagnosis Date  . Cataract   . Colon polyps   . Dysphagia   . Essential hypertension, benign   . Fatty liver   . Hyperplasia of prostate   . 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 Other and unspecified hyperlipidemia   . Parkinson disease ([place])   . Schizophrenia ([place])     PAST SURGICAL HISTORY: Past Surgical History:  Procedure Laterality Date  . COLONOSCOPY  02/13/08  . COLONOSCOPY N/A 07/10/2012  Procedure: COLONOSCOPY;  Surgeon: Rogene Houston, MD;  Location: [facility];  Service: Endoscopy;  Laterality: N/A;  225  . CYSTOSCOPY WITH INSERTION OF UROLIFT N/A 10/24/2017   Procedure: CYSTOSCOPY WITH INSERTION OF UROLIFT;  Surgeon: Cleon Gustin, MD;  Location: [facility] ORS;  Service: Urology;  Laterality: N/A;  . UPPER GASTROINTESTINAL ENDOSCOPY  E[facility] ED   09/09/2010  . URETHRAL DILATION  1965    FAMILY HISTORY: Family History  Problem Relation Age of Onset  . Heart failure Mother   . ALS Father   . Hyperlipidemia Sister   . Inflammatory bowel disease Paternal Grandfather     SOCIAL HISTORY:  Social History   Socioeconomic History  . Marital status: Single    Spouse name: Not on file  . Number of children: 0  . Years of education: 12th  . Highest education level: High school graduate  Occupational History    Employer: NOT EMPLOYED  Social Needs  . Financial resource strain: Not  hard at all  . Food insecurity:    Worry: Never true    Inability: Never true  . Transportation needs:    Medical: No    Non-medical: No  Tobacco Use  . Smoking status: Never Smoker  . Smokeless tobacco: Never Used  Substance and Sexual Activity  . Alcohol use: No    Frequency: Never  . Drug use: No  . Sexual activity: Never  Lifestyle  . Physical activity:    Days per week: 7 days    Minutes per session: 30 min  . Stress: To some extent  Relationships  . Social connections:    Talks on phone: More than three times a week    Gets together: More than three times a week    Attends religious service: More than 4 times per year    Active member of club or organization: No    Attends meetings of clubs or organizations: Never    Relationship status: Never married  . Intimate partner violence:    Fear of current or ex partner: Not on file    Emotionally abused: Not on file    Physically abused: Not on file    Forced sexual activity: Not on file  Other Topics Concern  . Not on file  Social History Narrative   Patient lives at [facility], which is an assisted living facility. His sister helps with his care. He has never been married and does not have children.      PHYSICAL EXAM  Vitals:   05/08/18 1459  BP: 107/64  Pulse: 87  Weight: 170 lb (77.1 kg)  Height: 6\' 1"  (1.854 m)    Not recorded      Body mass index is 22.43 kg/m.  GENERAL EXAM: Patient is in no distress  CARDIOVASCULAR: Regular rate and rhythm, no murmurs, no carotid bruits  NEUROLOGIC: MENTAL STATUS: awake, alert, language fluent, comprehension intact, naming intact; SEVERE MASKED FACIES. SEVERE PAUCITY OF SPEECH OUTPUT CRANIAL NERVE: pupils equal and reactive to light, visual fields full to confrontation, extraocular muscles intact, no nystagmus, facial sensation and strength symmetric, uvula midline, shoulder shrug symmetric, tongue midline. MOTOR: INCREASED TONE IN LUE WITH RARE, MINOR REST  TREMOR IN LUE. SIGNIFICANT BRADYKINESIA IN LUE. DYSTONIC POSTURE OF LEFT HAND. RUE AND BLE 5. LUE FINGER ABDUCTION AND EXTENSION 3. CONTRACTURE IN LEFT HAND  SENSORY: normal and symmetric to light touch COORDINATION: finger-nose-finger, fine finger movements normal REFLEXES: RUE 3, LUE 2, KNEES 2, ANKLES 2  GAIT/STATION: narrow based gait; SMOOTH GAIT; DECR ARM SWING.    DIAGNOSTIC DATA (LABS, IMAGING, TESTING) - I reviewed patient records, labs, notes, testing and imaging myself where available.  Lab Results  Component Value Date   WBC 5.1 11/28/2017   HGB 13.5 11/28/2017   HCT 40.6 11/28/2017   MCV 89 11/28/2017   PLT 228 11/28/2017      Component Value Date/Time   NA 139 11/28/2017 1528   K 3.7 11/28/2017 1528   CL 98 11/28/2017 1528   CO2 26 11/28/2017 1528   GLUCOSE 81 11/28/2017 1528   GLUCOSE 105 (H) 10/17/2017 1022   BUN 7 11/28/2017 1528   CREATININE 0.93 11/28/2017 1528   CREATININE 0.86 04/24/2013 0928   CALCIUM 9.8 11/28/2017 1528   PROT 6.8 01/27/2018 0828   ALBUMIN 4.6 01/27/2018 0828   AST 17 01/27/2018 0828   ALT 12 01/27/2018 0828   ALKPHOS 57 01/27/2018 0828   BILITOT 1.3 (H) 01/27/2018 0828   GFRNONAA 91 11/28/2017 1528   GFRNONAA >89 04/24/2013 0928   GFRAA 105 11/28/2017 1528   GFRAA >89 04/24/2013 0928   Lab Results  Component Value Date   CHOL 154 01/27/2018   HDL 51 01/27/2018   LDLCALC 92 01/27/2018   TRIG 56 01/27/2018   CHOLHDL 3.0 01/27/2018   Lab Results  Component Value Date   HGBA1C 4.8 11/08/2013   Lab Results  Component Value Date   [id number] 725 06/03/2016   Lab Results  Component Value Date   TSH 1.310 06/03/2016    11/29/12 MRI cervical  1. At C5-6: disc bulging with moderate spinal stenosis and severe biforaminal stenosis  2. At C3-4, C4-5, C6-7: disc bulging with mild spinal stenosis and severe biforaminal stenosis  3. No cord signal abnormalities.  11/29/12 MRI brain (without) demonstrating:  1. Single right  frontal subcortical focus (18mm) of non-specific gliosis.  2. No acute findings.  04/12/13 CT myelogram cervical spine - Central disc herniation at C3-4 with moderate central stenosis. Severe left foraminal narrowing at this level. Moderate central stenosis at C5-6 and C6-7 secondary to posterior disc osteophytes.  06/05/15 MRI cervical spine [I reviewed images myself and agree with interpretation. -VRP]  1. At C3-4 there is a mild broad-based disc bulge. Left uncovertebral degenerative change and left facet arthropathy resulting in severe left foraminal stenosis. Mild right foraminal stenosis. Mild spinal stenosis. 2. Cervical spine spondylosis as described above.     ASSESSMENT AND PLAN  58 y.o. year old male here with gradual onset, progressive left arm weakness, slowness and stiffness with some numbness. On exam, patient has masked facies, rigidity in the left arm, bradykinesia in the left arm, stooped posture and poor arm swing. Ordered [facility] to eval for parkinson's disease vs drug-induced parkinsonism --> but not affordable at this time   Dx: parkinsonism (likely idiopathic parkinson's disease; drug induced less likely as symptoms have continued to progress in spite of reducing anti-psychotic medications) + mild cervical spinal stenosis and cervical radiculopathy  Parkinson's disease ([place])  Spinal stenosis in cervical region    PLAN:  PARKINSON'S DISEASE (established problem, worsening) - continue carb/levo (1 tab 8am, 1 tab 2pm, 1.5tabs 8pm); cannot tolerate higher dose due to hallucinations - continue physical activity / exercises / PT - continue to use lowest possible dose of anti-psychotic medication (Dr. Letitia Caul; psychiatry)  LEFT HAND DYSTONIA - consider botox if pain develops in future - continue occupational therapy; consider wrist / hand splint  Return in about  1 year (around 05/09/2019).    Penni Bombard, MD 2/82/4175, 3:01 PM Certified in Neurology,  Neurophysiology and Neuroimaging  [facility] [address] [place], [place] [address] [phone]

## 2018-05-09 DIAGNOSIS — G20 Parkinson's disease: Secondary | ICD-10-CM | POA: Diagnosis not present

## 2018-05-09 DIAGNOSIS — R269 Unspecified abnormalities of gait and mobility: Secondary | ICD-10-CM | POA: Diagnosis not present

## 2018-05-09 DIAGNOSIS — M6281 Muscle weakness (generalized): Secondary | ICD-10-CM | POA: Diagnosis not present

## 2018-05-09 DIAGNOSIS — F209 Schizophrenia, unspecified: Secondary | ICD-10-CM | POA: Diagnosis not present

## 2018-05-11 DIAGNOSIS — M6281 Muscle weakness (generalized): Secondary | ICD-10-CM | POA: Diagnosis not present

## 2018-05-11 DIAGNOSIS — F209 Schizophrenia, unspecified: Secondary | ICD-10-CM | POA: Diagnosis not present

## 2018-05-11 DIAGNOSIS — R269 Unspecified abnormalities of gait and mobility: Secondary | ICD-10-CM | POA: Diagnosis not present

## 2018-05-11 DIAGNOSIS — G20 Parkinson's disease: Secondary | ICD-10-CM | POA: Diagnosis not present

## 2018-05-16 DIAGNOSIS — R269 Unspecified abnormalities of gait and mobility: Secondary | ICD-10-CM | POA: Diagnosis not present

## 2018-05-16 DIAGNOSIS — F209 Schizophrenia, unspecified: Secondary | ICD-10-CM | POA: Diagnosis not present

## 2018-05-16 DIAGNOSIS — M6281 Muscle weakness (generalized): Secondary | ICD-10-CM | POA: Diagnosis not present

## 2018-05-16 DIAGNOSIS — G20 Parkinson's disease: Secondary | ICD-10-CM | POA: Diagnosis not present

## 2018-05-18 DIAGNOSIS — G20 Parkinson's disease: Secondary | ICD-10-CM | POA: Diagnosis not present

## 2018-05-18 DIAGNOSIS — M6281 Muscle weakness (generalized): Secondary | ICD-10-CM | POA: Diagnosis not present

## 2018-05-18 DIAGNOSIS — R269 Unspecified abnormalities of gait and mobility: Secondary | ICD-10-CM | POA: Diagnosis not present

## 2018-05-18 DIAGNOSIS — F209 Schizophrenia, unspecified: Secondary | ICD-10-CM | POA: Diagnosis not present

## 2018-05-23 DIAGNOSIS — R269 Unspecified abnormalities of gait and mobility: Secondary | ICD-10-CM | POA: Diagnosis not present

## 2018-05-23 DIAGNOSIS — G20 Parkinson's disease: Secondary | ICD-10-CM | POA: Diagnosis not present

## 2018-05-23 DIAGNOSIS — M6281 Muscle weakness (generalized): Secondary | ICD-10-CM | POA: Diagnosis not present

## 2018-05-23 DIAGNOSIS — F209 Schizophrenia, unspecified: Secondary | ICD-10-CM | POA: Diagnosis not present

## 2018-05-25 DIAGNOSIS — M6281 Muscle weakness (generalized): Secondary | ICD-10-CM | POA: Diagnosis not present

## 2018-05-25 DIAGNOSIS — F209 Schizophrenia, unspecified: Secondary | ICD-10-CM | POA: Diagnosis not present

## 2018-05-25 DIAGNOSIS — R269 Unspecified abnormalities of gait and mobility: Secondary | ICD-10-CM | POA: Diagnosis not present

## 2018-05-25 DIAGNOSIS — G20 Parkinson's disease: Secondary | ICD-10-CM | POA: Diagnosis not present

## 2018-05-30 DIAGNOSIS — M6281 Muscle weakness (generalized): Secondary | ICD-10-CM | POA: Diagnosis not present

## 2018-05-30 DIAGNOSIS — F209 Schizophrenia, unspecified: Secondary | ICD-10-CM | POA: Diagnosis not present

## 2018-05-30 DIAGNOSIS — G20 Parkinson's disease: Secondary | ICD-10-CM | POA: Diagnosis not present

## 2018-05-30 DIAGNOSIS — R269 Unspecified abnormalities of gait and mobility: Secondary | ICD-10-CM | POA: Diagnosis not present

## 2018-06-09 DIAGNOSIS — M79675 Pain in left toe(s): Secondary | ICD-10-CM | POA: Diagnosis not present

## 2018-06-09 DIAGNOSIS — G629 Polyneuropathy, unspecified: Secondary | ICD-10-CM | POA: Diagnosis not present

## 2018-06-09 DIAGNOSIS — B351 Tinea unguium: Secondary | ICD-10-CM | POA: Diagnosis not present

## 2018-06-13 ENCOUNTER — Ambulatory Visit (INDEPENDENT_AMBULATORY_CARE_PROVIDER_SITE_OTHER): Payer: Medicare Other | Admitting: Internal Medicine

## 2018-06-14 ENCOUNTER — Encounter: Payer: Medicare Other | Admitting: *Deleted

## 2018-07-26 ENCOUNTER — Encounter: Payer: Medicare Other | Admitting: *Deleted

## 2018-08-30 ENCOUNTER — Ambulatory Visit (INDEPENDENT_AMBULATORY_CARE_PROVIDER_SITE_OTHER): Payer: Medicare Other | Admitting: *Deleted

## 2018-08-30 DIAGNOSIS — Z0000 Encounter for general adult medical examination without abnormal findings: Secondary | ICD-10-CM | POA: Diagnosis not present

## 2018-08-30 NOTE — Patient Instructions (Signed)
Preventive Care 40-64 Years, Male Preventive care refers to lifestyle choices and visits with your health care provider that can promote health and wellness. What does preventive care include?   A yearly physical exam. This is also called an annual well check.  Dental exams once or twice a year.  Routine eye exams. Ask your health care provider how often you should have your eyes checked.  Personal lifestyle choices, including: ? Daily care of your teeth and gums. ? Regular physical activity. ? Eating a healthy diet. ? Avoiding tobacco and drug use. ? Limiting alcohol use. ? Practicing safe sex. ? Taking low-dose aspirin every day starting at age 50. What happens during an annual well check? The services and screenings done by your health care provider during your annual well check will depend on your age, overall health, lifestyle risk factors, and family history of disease. Counseling Your health care provider may ask you questions about your:  Alcohol use.  Tobacco use.  Drug use.  Emotional well-being.  Home and relationship well-being.  Sexual activity.  Eating habits.  Work and work environment. Screening You may have the following tests or measurements:  Height, weight, and BMI.  Blood pressure.  Lipid and cholesterol levels. These may be checked every 5 years, or more frequently if you are over 50 years old.  Skin check.  Lung cancer screening. You may have this screening every year starting at age 55 if you have a 30-pack-year history of smoking and currently smoke or have quit within the past 15 years.  Colorectal cancer screening. All adults should have this screening starting at age 50 and continuing until age 75. Your health care provider may recommend screening at age 45. You will have tests every 1-10 years, depending on your results and the type of screening test. People at increased risk should start screening at an earlier age. Screening tests may  include: ? Guaiac-based fecal occult blood testing. ? Fecal immunochemical test (FIT). ? Stool DNA test. ? Virtual colonoscopy. ? Sigmoidoscopy. During this test, a flexible tube with a tiny camera (sigmoidoscope) is used to examine your rectum and lower colon. The sigmoidoscope is inserted through your anus into your rectum and lower colon. ? Colonoscopy. During this test, a long, thin, flexible tube with a tiny camera (colonoscope) is used to examine your entire colon and rectum.  Prostate cancer screening. Recommendations will vary depending on your family history and other risks.  Hepatitis C blood test.  Hepatitis B blood test.  Sexually transmitted disease (STD) testing.  Diabetes screening. This is done by checking your blood sugar (glucose) after you have not eaten for a while (fasting). You may have this done every 1-3 years. Discuss your test results, treatment options, and if necessary, the need for more tests with your health care provider. Vaccines Your health care provider may recommend certain vaccines, such as:  Influenza vaccine. This is recommended every year.  Tetanus, diphtheria, and acellular pertussis (Tdap, Td) vaccine. You may need a Td booster every 10 years.  Varicella vaccine. You may need this if you have not been vaccinated.  Zoster vaccine. You may need this after age 60.  Measles, mumps, and rubella (MMR) vaccine. You may need at least one dose of MMR if you were born in 1957 or later. You may also need a second dose.  Pneumococcal 13-valent conjugate (PCV13) vaccine. You may need this if you have certain conditions and have not been vaccinated.  Pneumococcal polysaccharide (PPSV23) vaccine.   You may need one or two doses if you smoke cigarettes or if you have certain conditions.  Meningococcal vaccine. You may need this if you have certain conditions.  Hepatitis A vaccine. You may need this if you have certain conditions or if you travel or work in  places where you may be exposed to hepatitis A.  Hepatitis B vaccine. You may need this if you have certain conditions or if you travel or work in places where you may be exposed to hepatitis B.  Haemophilus influenzae type b (Hib) vaccine. You may need this if you have certain risk factors. Talk to your health care provider about which screenings and vaccines you need and how often you need them. This information is not intended to replace advice given to you by your health care provider. Make sure you discuss any questions you have with your health care provider. Document Released: 04/04/2015 Document Revised: 04/28/2017 Document Reviewed: 01/07/2015 Elsevier Interactive Patient Education  2019 [employer]  

## 2018-08-30 NOTE — Progress Notes (Signed)
MEDICARE ANNUAL WELLNESS VISIT  08/30/2018  Telephone Visit Disclaimer This Medicare AWV was conducted by telephone due to national recommendations for restrictions regarding the COVID-19 Pandemic (e.g. social distancing).  I verified, using two identifiers, that I am speaking with Mark Benjamin or their authorized healthcare agent. I discussed the limitations, risks, security, and privacy concerns of performing an evaluation and management service by telephone and the potential availability of an in-person appointment in the future. The patient expressed understanding and agreed to proceed.   Subjective:  Mark Benjamin is a 58 y.o. male patient of Mark Herb, MD who had a Medicare Annual Wellness Visit today via telephone. Mark Benjamin is Disabled and lives in an assisted living facility. he has 0 children. he reports that he is socially active and does interact with friends/family regularly. he is minimally physically active and enjoys doing jigsaw puzzles.  Patient Care Team: Mark Herb, MD as PCP - General (Family Medicine) Vaughan Basta, Rona Ravens, NP as Nurse Practitioner (Internal Medicine) Cristal Deer, DPM as Consulting Physician (Podiatry) Melina Schools, OD as Consulting Physician (Optometry) Services, [facility] as Consulting Physician Penni Bombard, MD as Consulting Physician (Neurology)  Advanced Directives 10/24/2017 10/17/2017 05/04/2017 05/04/2017 02/14/2017 01/14/2017 10/01/2016  Does Patient Have a Medical Advance Directive? 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 No - Patient declined - No - Patient declined - - - -  Copy of [place] in Chart? Yes Yes Yes - - - [facility]  Utilization Over the Past 12 Months: # of hospitalizations or ER visits: 0 # of surgeries: 1  Review of Systems    Patient reports that his overall health is worse compared to last year.  Patient Reported Readings (BP, Pulse, CBG, Weight, etc) Weight 172 lbs (wt from this morning)  Review of Systems: No complaints  All other systems negative.  Pain Assessment Pain : No/denies pain     Current Medications & Allergies (verified) Allergies as of 08/30/2018      Reactions   Amoxicillin    Dizzy and blurred vision   Erythromycin    Sulfamethoxazole-trimethoprim Other (See Comments)   Extreme weakness/lethorgy   Zocor [simvastatin]       Medication List       Accurate as of August 30, 2018 10:03 AM. If you have any questions, ask your nurse or doctor.        STOP taking these medications   traMADol 50 MG tablet Commonly known as:  Ultram     TAKE these medications   acetaminophen 500 MG tablet Commonly known as:  TYLENOL Take 1,000 mg by mouth every 6 (six) hours as needed (for fever.).   AZO Cranberry Gummies 500 MG Chew Generic drug:  Cranberry Chew 500 mg by mouth daily.   benztropine 1 MG tablet Commonly known as:  COGENTIN Take 1 mg by mouth 2 (two) times daily.   carbidopa-levodopa 25-100 MG tablet Commonly known as:  SINEMET IR Take 1 tab at 8am, 1 tab at 2pm, 1.5 tabs at bedtime. What changed:    how much to take  how to take this  when to take this  additional instructions   cholecalciferol 25 MCG (  1000 UT) tablet Commonly known as:  VITAMIN D Take 3,000 Units by mouth daily.   divalproex 250 MG DR tablet Commonly known as:  DEPAKOTE Take 250 mg by mouth 2 (two) times daily.   docusate sodium 100 MG capsule Commonly known as:  COLACE Take 100 mg by mouth daily.   fosinopril-hydrochlorothiazide 10-12.5 MG tablet Commonly known as:  MONOPRIL-HCT Take 1 tablet by mouth daily.   loperamide 2 MG capsule Commonly known as:  IMODIUM Take  2 mg by mouth See admin instructions. Give 1 capsule (2 mg) by mouth with each loose stool up to 8 doses in 24 hr   Lubricant Eye Drops 0.4-0.3 % Soln Generic drug:  Polyethyl Glycol-Propyl Glycol Place 1 drop into both eyes 4 (four) times daily as needed (for dry/irritated eyes (wait 3-5 min between different eye drops)).   magnesium hydroxide 400 MG/5ML suspension Commonly known as:  MILK OF MAGNESIA Take 30 mLs by mouth daily as needed (for constipation.).   multivitamin with minerals Tabs tablet Take 1 tablet by mouth daily.   PreviDent 5000 Booster 1.1 % Pste Generic drug:  Sodium Fluoride Place 1 application onto teeth at bedtime.   Probiotic Caps Take 1 capsule by mouth 2 (two) times daily. What changed:  when to take this   risperiDONE 1 MG tablet Commonly known as:  RISPERDAL Take 1 mg by mouth daily.   risperiDONE 2 MG tablet Commonly known as:  RISPERDAL Take 2 mg by mouth at bedtime.   rosuvastatin 5 MG tablet Commonly known as:  Crestor Take 0.5 tablets (2.5 mg total) by mouth every other day. Take 1/2 tablet on Monday, Wednesday and Friday       History (reviewed): Past Medical History:  Diagnosis Date  . Cataract   . Colon polyps   . Dysphagia   . Essential hypertension, benign   . Fatty liver   . Hyperplasia of prostate   . 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 Other and unspecified hyperlipidemia   . Parkinson disease ([place])   . Schizophrenia [facility])    Past Surgical History:  Procedure Laterality Date  . COLONOSCOPY  02/13/08  . COLONOSCOPY N/A 07/10/2012   Procedure: COLONOSCOPY;  Surgeon: Rogene Houston, MD;  Location: [facility]O SUITE;  Service: Endoscopy;  Laterality: N/A;  225  . CYSTOSCOPY WITH INSERTION OF UROLIFT N/A 10/24/2017   Procedure: CYSTOSCOPY WITH INSERTION OF UROLIFT;  Surgeon: Cleon Gustin, MD;  Location: [facility] ORS;  Service: Urology;  Laterality: N/A;  . UPPER GASTROINTESTINAL ENDOSCOPY  [facility] ED    09/09/2010  . URETHRAL DILATION  1965   Family History  Problem Relation Age of Onset  . Heart failure Mother   . ALS Father   . Hyperlipidemia Sister   . Inflammatory bowel disease Paternal Grandfather    Social History   Socioeconomic History  . Marital status: Single    Spouse name: Not on file  . Number of children: 0  . Years of education: 12th  . Highest education level: High school graduate  Occupational History    Employer: NOT EMPLOYED  Social Needs  . Financial resource strain: Not hard at all  . Food insecurity:    Worry: Never true    Inability: Never true  . Transportation needs:    Medical: No    Non-medical: No  Tobacco Use  . Smoking status: Never Smoker  . Smokeless tobacco: Never Used  Substance and Sexual Activity  .  Alcohol use: No    Frequency: Never  . Drug use: No  . Sexual activity: Not Currently    Birth control/protection: None  Lifestyle  . Physical activity:    Days per week: 7 days    Minutes per session: 10 min  . Stress: To some extent  Relationships  . Social connections:    Talks on phone: More than three times a week    Gets together: More than three times a week    Attends religious service: Never    Active member of club or organization: No    Attends meetings of clubs or organizations: Never    Relationship status: Never married  Other Topics Concern  . Not on file  Social History Narrative   Patient lives at [facility], which is an assisted living facility. His sister helps with his care. He has never been married and does not have children.     Activities of Daily Living In your present state of health, do you have any difficulty performing the following activities: 08/30/2018 10/24/2017  Hearing? N N  Vision? N Y  Comment - -  Difficulty concentrating or making decisions? Tempie Donning  Comment lives in assisted living facility -  Walking or climbing stairs? Y Y  Comment he sometimes uses his walker due to his Parkinsons -   Dressing or bathing? Y Y  Comment due to his Parkinsons-scared he may fall without some assistance -  Doing errands, shopping? Y -  Comment he lives in [place] and eating ? Y -  Comment due to his Parkinsons-eats at facility and needs help with feeding at times -  Using the Toilet? N -  In the past six months, have you accidently leaked urine? N -  Do you have problems with loss of bowel control? N -  Managing your Medications? Y -  Comment facility takes care of all his medications -  Managing your Finances? Y -  Housekeeping or managing your Housekeeping? Y -  Comment lives in [place] recent data might be hidden    Patient Literacy How often do you need to have someone help you when you read instructions, pamphlets, or other written materials from your doctor or pharmacy?: 1 - Never What is the last grade level you completed in school?: 12th grade  Exercise Current Exercise Habits: Home exercise routine, Type of exercise: walking, Time (Minutes): 10, Frequency (Times/Week): 7, Weekly Exercise (Minutes/Week): 70, Intensity: Mild, Exercise limited by: Other - see comments(parkinsons)  Diet Patient reports consuming 3 meals a day and 1 snack(s) a day Patient reports that his primary diet is: Regular Patient reports that she does have regular access to food.   Depression Screen PHQ 2/9 Scores 08/30/2018 05/04/2017 02/08/2017 12/25/2016 12/01/2016 10/06/2016 06/03/2016  PHQ - 2 Score 0 0 4 0 0 4 1  PHQ- 9 Score - - 15 - - 18 -     Fall Risk Fall Risk  08/30/2018 05/04/2017 02/08/2017 12/25/2016 12/01/2016  Falls in the past year? 0 No Yes No Yes  Number falls in past yr: - - 1 - 1  Injury with Fall? - - No - No  Risk for fall due to : - Impaired balance/gait - - -  Risk for fall due to: Comment - parkinsons disease - - -     Objective:  Mark Benjamin seemed alert and oriented and he participated appropriately during our  telephone visit.  Blood Pressure Weight BMI  BP Readings from Last 3 Encounters:  05/08/18 107/64  10/24/17 (!) 143/81  10/17/17 117/73   Wt Readings from Last 3 Encounters:  05/08/18 170 lb (77.1 kg)  10/24/17 170 lb (77.1 kg)  10/17/17 170 lb (77.1 kg)   BMI Readings from Last 1 Encounters:  05/08/18 22.43 kg/m    *Unable to obtain current vital signs, weight, and BMI due to telephone visit type  Hearing/Vision  . Thanos did not seem to have difficulty with hearing/understanding during the telephone conversation . Reports that he has not had a formal eye exam by an eye care professional within the past year . Reports that he has not had a formal hearing evaluation within the past year *Unable to fully assess hearing and vision during telephone visit type  Cognitive Function: 6CIT Screen 08/30/2018  What Year? 0 points  What month? 0 points  What time? 0 points  Count back from 20 0 points  Months in reverse 0 points  Repeat phrase 4 points  Total Score 4    Normal Cognitive Function Screening: Yes (Normal:0-7, Significant for Dysfunction: >8)  Immunization & Health Maintenance Record Immunization History  Administered Date(s) Administered  . Influenza Whole 12/20/2009  . Influenza, High Dose Seasonal PF 01/11/2017  . Influenza,inj,Quad PF,6+ Mos 12/23/2014, 01/08/2016  . Influenza-Unspecified 12/16/2016, 12/27/2017  . PPD Test 10/06/2016  . Tdap 07/21/2006    Health Maintenance  Topic Date Due  . Hepatitis C Screening  Jun 23, 1960  . HIV Screening  07/04/1975  . TETANUS/TDAP  07/20/2016  . COLON CANCER SCREENING ANNUAL FOBT  02/15/2018  . INFLUENZA VACCINE  10/21/2018  . COLONOSCOPY  07/11/2022       Assessment  This is a routine wellness examination for KESEAN SERVISS.  Health Maintenance: Due or Overdue Health Maintenance Due  Topic Date Due  . Hepatitis C Screening  07-13-1960  . HIV Screening  07/04/1975  . TETANUS/TDAP  07/20/2016  . COLON  CANCER SCREENING ANNUAL FOBT  02/15/2018    Mark Benjamin does not need a referral for Community Assistance: Care Management:   no Social Work:    no Prescription Assistance:  no Nutrition/Diabetes Education:  no   Plan:  Personalized Goals Goals Addressed   None    Personalized Health Maintenance & Screening Recommendations  Td vaccine Colorectal cancer screening  Lung Cancer Screening Recommended: no (Low Dose CT Chest recommended if Age 90-80 years, 30 pack-year currently smoking OR have quit w/in past 15 years) Hepatitis C Screening recommended: yes HIV Screening recommended: yes  Advanced Directives: Written information was not prepared per patient's request.  Referrals & Orders No orders of the defined types were placed in this encounter.   Follow-up Plan . Follow-up with Mark Herb, MD as planned    I have personally reviewed and noted the following in the patient's chart:   . Medical and social history . Use of alcohol, tobacco or illicit drugs  . Current medications and supplements . Functional ability and status . Nutritional status . Physical activity . Advanced directives . List of other physicians . Hospitalizations, surgeries, and ER visits in previous 12 months . Vitals . Screenings to include cognitive, depression, and falls . Referrals and appointments  In addition, I have reviewed and discussed with Mark Benjamin certain preventive protocols, quality metrics, and best practice recommendations. A written personalized care plan for preventive services as well as general preventive health recommendations is available  and can be mailed to the patient at his request.      Marylin Crosby  08/30/2018

## 2018-08-31 ENCOUNTER — Encounter: Payer: Medicare Other | Admitting: *Deleted

## 2018-10-20 DIAGNOSIS — B351 Tinea unguium: Secondary | ICD-10-CM | POA: Diagnosis not present

## 2018-10-20 DIAGNOSIS — G629 Polyneuropathy, unspecified: Secondary | ICD-10-CM | POA: Diagnosis not present

## 2018-10-20 DIAGNOSIS — M79675 Pain in left toe(s): Secondary | ICD-10-CM | POA: Diagnosis not present

## 2018-10-24 ENCOUNTER — Ambulatory Visit (INDEPENDENT_AMBULATORY_CARE_PROVIDER_SITE_OTHER): Payer: Medicare Other | Admitting: Urology

## 2018-10-24 DIAGNOSIS — R3914 Feeling of incomplete bladder emptying: Secondary | ICD-10-CM

## 2018-10-24 DIAGNOSIS — N401 Enlarged prostate with lower urinary tract symptoms: Secondary | ICD-10-CM | POA: Diagnosis not present

## 2018-10-25 ENCOUNTER — Other Ambulatory Visit: Payer: Self-pay | Admitting: Family Medicine

## 2018-10-25 ENCOUNTER — Telehealth: Payer: Self-pay | Admitting: Family Medicine

## 2018-10-25 DIAGNOSIS — H6123 Impacted cerumen, bilateral: Secondary | ICD-10-CM

## 2018-10-25 MED ORDER — DEBROX 6.5 % OT SOLN
5.0000 [drp] | Freq: Two times a day (BID) | OTIC | 0 refills | Status: DC
Start: 2018-10-25 — End: 2019-08-24

## 2018-10-25 NOTE — Telephone Encounter (Signed)
Debrox over the counter in both ears every night.

## 2018-10-25 NOTE — Telephone Encounter (Signed)
Please review and advise. Patient at [facility]

## 2018-10-25 NOTE — Telephone Encounter (Signed)
Done, sorry

## 2018-10-25 NOTE — Telephone Encounter (Signed)
He is in [facility], an order must be sent to facility and pharmacy for delivery

## 2018-10-31 DIAGNOSIS — F29 Unspecified psychosis not due to a substance or known physiological condition: Secondary | ICD-10-CM | POA: Diagnosis not present

## 2018-12-21 DIAGNOSIS — M79622 Pain in left upper arm: Secondary | ICD-10-CM | POA: Diagnosis not present

## 2018-12-21 DIAGNOSIS — M6281 Muscle weakness (generalized): Secondary | ICD-10-CM | POA: Diagnosis not present

## 2018-12-21 DIAGNOSIS — F209 Schizophrenia, unspecified: Secondary | ICD-10-CM | POA: Diagnosis not present

## 2018-12-21 DIAGNOSIS — G20 Parkinson's disease: Secondary | ICD-10-CM | POA: Diagnosis not present

## 2018-12-21 DIAGNOSIS — M4800 Spinal stenosis, site unspecified: Secondary | ICD-10-CM | POA: Diagnosis not present

## 2018-12-21 DIAGNOSIS — I10 Essential (primary) hypertension: Secondary | ICD-10-CM | POA: Diagnosis not present

## 2018-12-28 ENCOUNTER — Telehealth (INDEPENDENT_AMBULATORY_CARE_PROVIDER_SITE_OTHER): Payer: Medicare Other | Admitting: Family Medicine

## 2018-12-28 ENCOUNTER — Telehealth: Payer: Medicare Other | Admitting: Family Medicine

## 2018-12-28 DIAGNOSIS — Z5329 Procedure and treatment not carried out because of patient's decision for other reasons: Secondary | ICD-10-CM

## 2018-12-28 DIAGNOSIS — Z91199 Patient's noncompliance with other medical treatment and regimen due to unspecified reason: Secondary | ICD-10-CM

## 2018-12-28 NOTE — Progress Notes (Signed)
Unable to reach via telephone or MyChart Video. Will have nurse reach out to see if the appointment can be rescheduled.

## 2019-01-02 DIAGNOSIS — Z23 Encounter for immunization: Secondary | ICD-10-CM | POA: Diagnosis not present

## 2019-01-10 ENCOUNTER — Encounter: Payer: Self-pay | Admitting: Physician Assistant

## 2019-01-10 ENCOUNTER — Telehealth (INDEPENDENT_AMBULATORY_CARE_PROVIDER_SITE_OTHER): Payer: Medicare Other | Admitting: Physician Assistant

## 2019-01-10 DIAGNOSIS — M4802 Spinal stenosis, cervical region: Secondary | ICD-10-CM

## 2019-01-10 DIAGNOSIS — G20 Parkinson's disease: Secondary | ICD-10-CM | POA: Diagnosis not present

## 2019-01-10 DIAGNOSIS — R29898 Other symptoms and signs involving the musculoskeletal system: Secondary | ICD-10-CM | POA: Diagnosis not present

## 2019-01-10 NOTE — Patient Instructions (Signed)
Mark Benjamin no

## 2019-01-10 NOTE — Progress Notes (Signed)
Telephone visit  Subjective: CC: Parkinson's disease, cervical spine stenosis PCP: Baruch Gouty, FNP PA:[id number] [name] is a 58 y.o. male calls for [place]  consult today. Patient provides verbal consent for consult held via phone.  Patient is identified with 2 separate identifiers.  At this time the entire area is on COVID-19 social distancing and stay home orders are in place.  Patient is of higher risk and therefore we are performing this by a virtual method.  Location of patient: [facility], care center Location of provider: HOME Others present for call: Nurse from [facility] He was escorted out by one of the nurse technicians, and he was present on the porch for our video visit    This patient is having a face-to-face via video.  He is a resident at [facility], residential care center.  His nurse is with him from [facility].  He has been a client for the for several years.  He continues to have significant spasticity and limited range of motion in his upper extremities, the left being much greater than the right.  He does have Parkinson's disease and this is the main cause.  But the patient does also have stenosis of the cervical spine seen on MRI.  He reports that once here he sees his neurologist.    He is still currently taking carbidopa-levodopa 25-100.  The current dose noted is 1 tablet at 8 AM, 1 tablet at 2 PM and 1-1/2 tablets at bedtime.  In reviewing his neurology notes it seemed that when he took much higher doses he was experiencing some hallucinations.  The patient does still see psychiatry on a every 3 to 56-month basis.  He does have schizophrenia.  Also in the neurology note it was recommended that he take as a low antipsychotic doses as possible because of their side effects.  He states that he has a hard time also sleeping at night because of the pain that will be in his right arm and the weakness in the left.  He cannot get in a comfortable position to  sleep.  He states that he does use a cane most of the time to assure stability.  He will occasionally use his walker if he is feeling a lot weaker.  He does report that he needs help with most of his ADLs.  The main thing is the weakness and significantly decreased range of motion.  We recommend that he continue his occupational therapy and physical therapy to the above-noted conditions.  The order can be faxed to [phone]  ROS: Per HPI  Allergies  Allergen Reactions  . Amoxicillin     Dizzy and blurred vision  . Erythromycin   . Sulfamethoxazole-Trimethoprim Other (See Comments)    Extreme weakness/lethorgy  . Zocor [Simvastatin]    Past Medical History:  Diagnosis Date  . Cataract   . Colon polyps   . Dysphagia   . Essential hypertension, benign   . Fatty liver   . Hyperplasia of prostate   . 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 Other and unspecified hyperlipidemia   . Parkinson disease ([place])   . Schizophrenia ([place])     Current Outpatient Medications:  .  acetaminophen (TYLENOL) 500 MG tablet, Take 1,000 mg by mouth every 6 (six) hours as needed (for fever.)., Disp: , Rfl:  .  benztropine (COGENTIN) 1 MG tablet, Take 1 mg by mouth 2 (two) times daily. , Disp: ,  Rfl:  .  carbamide peroxide (DEBROX) 6.5 % OTIC solution, Place 5 drops into both ears 2 (two) times daily., Disp: 15 mL, Rfl: 0 .  carbidopa-levodopa (SINEMET IR) 25-100 MG tablet, Take 1 tab at 8am, 1 tab at 2pm, 1.5 tabs at bedtime. (Patient taking differently: Take 1-1.5 tablets by mouth See admin instructions. TAKE 1 TABLET BY MOUTH AT 0800, TAKE 1 TABLET BY MOUTH AT 1400, & TAKE 1.5 TABLETS BY MOUTH AT BEDTIME.), Disp: 120 tablet, Rfl: 12 .  cholecalciferol (VITAMIN D) 1000 units tablet, Take 3,000 Units by mouth daily., Disp: , Rfl:  .  Cranberry (AZO CRANBERRY GUMMIES) 500 MG CHEW, Chew 500 mg by mouth daily. , Disp: , Rfl:  .  divalproex (DEPAKOTE) 250 MG DR tablet, Take 250 mg  by mouth 2 (two) times daily. , Disp: , Rfl:  .  docusate sodium (COLACE) 100 MG capsule, Take 100 mg by mouth daily. , Disp: , Rfl:  .  fosinopril-hydrochlorothiazide (MONOPRIL-HCT) 10-12.5 MG tablet, Take 1 tablet by mouth daily., Disp: , Rfl:  .  loperamide (IMODIUM) 2 MG capsule, Take 2 mg by mouth See admin instructions. Give 1 capsule (2 mg) by mouth with each loose stool up to 8 doses in 24 hr, Disp: , Rfl:  .  magnesium hydroxide (MILK OF MAGNESIA) 400 MG/5ML suspension, Take 30 mLs by mouth daily as needed (for constipation.)., Disp: , Rfl:  .  Multiple Vitamin (MULTIVITAMIN WITH MINERALS) TABS tablet, Take 1 tablet by mouth daily., Disp: , Rfl:  .  Polyethyl Glycol-Propyl Glycol (LUBRICANT EYE DROPS) 0.4-0.3 % SOLN, Place 1 drop into both eyes 4 (four) times daily as needed (for dry/irritated eyes (wait 3-5 min between different eye drops)). , Disp: , Rfl:  .  Probiotic CAPS, Take 1 capsule by mouth 2 (two) times daily. (Patient taking differently: Take 1 capsule by mouth daily. ), Disp: 60 capsule, Rfl: 5 .  risperiDONE (RISPERDAL) 1 MG tablet, Take 1 mg by mouth daily. , Disp: , Rfl:  .  risperiDONE (RISPERDAL) 2 MG tablet, Take 2 mg by mouth at bedtime., Disp: , Rfl:  .  rosuvastatin (CRESTOR) 5 MG tablet, Take 0.5 tablets (2.5 mg total) by mouth every other day. Take 1/2 tablet on Monday, Wednesday and Friday, Disp: 45 tablet, Rfl: 3 .  Sodium Fluoride (PREVIDENT 5000 BOOSTER) 1.1 % PSTE, Place 1 application onto teeth at bedtime., Disp: , Rfl:   Assessment/ Plan: 58 y.o. male   1. Primary Parkinsonism ([place]) Continue occupational therapy and physical therapy  2. Left arm weakness Continue occupational therapy and physical therapy  3. Spinal stenosis of cervical region Continue occupational therapy and physical therapy  This note will be discussed with his PCP, Monia Pouch.   No follow-ups on file.  Continue all other maintenance medications as listed above.  Start  time: 11:23 AM End time: 11:39 AM  No orders of the defined types were placed in this encounter.   Particia Nearing PA-C [place] [phone]

## 2019-01-12 ENCOUNTER — Other Ambulatory Visit: Payer: Self-pay

## 2019-01-12 ENCOUNTER — Ambulatory Visit (INDEPENDENT_AMBULATORY_CARE_PROVIDER_SITE_OTHER): Payer: Medicare Other

## 2019-01-12 DIAGNOSIS — G20 Parkinson's disease: Secondary | ICD-10-CM | POA: Diagnosis not present

## 2019-01-12 DIAGNOSIS — M6281 Muscle weakness (generalized): Secondary | ICD-10-CM | POA: Diagnosis not present

## 2019-01-12 DIAGNOSIS — M79622 Pain in left upper arm: Secondary | ICD-10-CM | POA: Diagnosis not present

## 2019-01-12 DIAGNOSIS — M4800 Spinal stenosis, site unspecified: Secondary | ICD-10-CM | POA: Diagnosis not present

## 2019-01-12 DIAGNOSIS — F209 Schizophrenia, unspecified: Secondary | ICD-10-CM | POA: Diagnosis not present

## 2019-01-12 DIAGNOSIS — I10 Essential (primary) hypertension: Secondary | ICD-10-CM | POA: Diagnosis not present

## 2019-01-20 DIAGNOSIS — G20 Parkinson's disease: Secondary | ICD-10-CM | POA: Diagnosis not present

## 2019-01-20 DIAGNOSIS — M4800 Spinal stenosis, site unspecified: Secondary | ICD-10-CM | POA: Diagnosis not present

## 2019-01-20 DIAGNOSIS — I10 Essential (primary) hypertension: Secondary | ICD-10-CM | POA: Diagnosis not present

## 2019-01-20 DIAGNOSIS — M6281 Muscle weakness (generalized): Secondary | ICD-10-CM | POA: Diagnosis not present

## 2019-01-20 DIAGNOSIS — F209 Schizophrenia, unspecified: Secondary | ICD-10-CM | POA: Diagnosis not present

## 2019-01-20 DIAGNOSIS — M79622 Pain in left upper arm: Secondary | ICD-10-CM | POA: Diagnosis not present

## 2019-01-22 DIAGNOSIS — G629 Polyneuropathy, unspecified: Secondary | ICD-10-CM | POA: Diagnosis not present

## 2019-01-22 DIAGNOSIS — B351 Tinea unguium: Secondary | ICD-10-CM | POA: Diagnosis not present

## 2019-01-22 DIAGNOSIS — M79675 Pain in left toe(s): Secondary | ICD-10-CM | POA: Diagnosis not present

## 2019-01-23 DIAGNOSIS — M4800 Spinal stenosis, site unspecified: Secondary | ICD-10-CM | POA: Diagnosis not present

## 2019-01-23 DIAGNOSIS — I10 Essential (primary) hypertension: Secondary | ICD-10-CM | POA: Diagnosis not present

## 2019-01-23 DIAGNOSIS — F209 Schizophrenia, unspecified: Secondary | ICD-10-CM | POA: Diagnosis not present

## 2019-01-23 DIAGNOSIS — G20 Parkinson's disease: Secondary | ICD-10-CM | POA: Diagnosis not present

## 2019-01-23 DIAGNOSIS — M79622 Pain in left upper arm: Secondary | ICD-10-CM | POA: Diagnosis not present

## 2019-01-23 DIAGNOSIS — M6281 Muscle weakness (generalized): Secondary | ICD-10-CM | POA: Diagnosis not present

## 2019-01-25 ENCOUNTER — Telehealth: Payer: Self-pay | Admitting: Family Medicine

## 2019-01-26 ENCOUNTER — Ambulatory Visit: Payer: Medicare Other | Admitting: Family Medicine

## 2019-02-19 DIAGNOSIS — M4800 Spinal stenosis, site unspecified: Secondary | ICD-10-CM | POA: Diagnosis not present

## 2019-02-19 DIAGNOSIS — M79622 Pain in left upper arm: Secondary | ICD-10-CM | POA: Diagnosis not present

## 2019-02-19 DIAGNOSIS — G20 Parkinson's disease: Secondary | ICD-10-CM | POA: Diagnosis not present

## 2019-02-19 DIAGNOSIS — F209 Schizophrenia, unspecified: Secondary | ICD-10-CM | POA: Diagnosis not present

## 2019-02-19 DIAGNOSIS — I10 Essential (primary) hypertension: Secondary | ICD-10-CM | POA: Diagnosis not present

## 2019-02-19 DIAGNOSIS — M6281 Muscle weakness (generalized): Secondary | ICD-10-CM | POA: Diagnosis not present

## 2019-02-21 DIAGNOSIS — M4800 Spinal stenosis, site unspecified: Secondary | ICD-10-CM | POA: Diagnosis not present

## 2019-02-21 DIAGNOSIS — I10 Essential (primary) hypertension: Secondary | ICD-10-CM | POA: Diagnosis not present

## 2019-02-21 DIAGNOSIS — M6281 Muscle weakness (generalized): Secondary | ICD-10-CM | POA: Diagnosis not present

## 2019-02-21 DIAGNOSIS — F209 Schizophrenia, unspecified: Secondary | ICD-10-CM | POA: Diagnosis not present

## 2019-02-21 DIAGNOSIS — M79622 Pain in left upper arm: Secondary | ICD-10-CM | POA: Diagnosis not present

## 2019-02-21 DIAGNOSIS — G20 Parkinson's disease: Secondary | ICD-10-CM | POA: Diagnosis not present

## 2019-02-23 DIAGNOSIS — F209 Schizophrenia, unspecified: Secondary | ICD-10-CM | POA: Diagnosis not present

## 2019-02-23 DIAGNOSIS — M4800 Spinal stenosis, site unspecified: Secondary | ICD-10-CM | POA: Diagnosis not present

## 2019-02-23 DIAGNOSIS — I10 Essential (primary) hypertension: Secondary | ICD-10-CM | POA: Diagnosis not present

## 2019-02-23 DIAGNOSIS — M6281 Muscle weakness (generalized): Secondary | ICD-10-CM | POA: Diagnosis not present

## 2019-02-23 DIAGNOSIS — M79622 Pain in left upper arm: Secondary | ICD-10-CM | POA: Diagnosis not present

## 2019-02-23 DIAGNOSIS — G20 Parkinson's disease: Secondary | ICD-10-CM | POA: Diagnosis not present

## 2019-03-21 DIAGNOSIS — I10 Essential (primary) hypertension: Secondary | ICD-10-CM | POA: Diagnosis not present

## 2019-03-21 DIAGNOSIS — M79622 Pain in left upper arm: Secondary | ICD-10-CM | POA: Diagnosis not present

## 2019-03-21 DIAGNOSIS — M4800 Spinal stenosis, site unspecified: Secondary | ICD-10-CM | POA: Diagnosis not present

## 2019-03-21 DIAGNOSIS — G20 Parkinson's disease: Secondary | ICD-10-CM | POA: Diagnosis not present

## 2019-03-21 DIAGNOSIS — M6281 Muscle weakness (generalized): Secondary | ICD-10-CM | POA: Diagnosis not present

## 2019-03-21 DIAGNOSIS — F209 Schizophrenia, unspecified: Secondary | ICD-10-CM | POA: Diagnosis not present

## 2019-03-26 ENCOUNTER — Telehealth: Payer: Self-pay | Admitting: Family Medicine

## 2019-03-26 NOTE — Telephone Encounter (Signed)
This is probably fine but will defer to PCP's return tomorrow.  Brief chart review shows no contraindications to COVID vaccine

## 2019-03-27 NOTE — Telephone Encounter (Signed)
I do not see a contraindication to him getting the Covid vaccination so I would go ahead and give the order that they can give that.   What nasal spray are they looking for, they looking for nasal saline or Flonase, please asked them and I am okay of sending a prescription of either 1 depending on what they were asking for. Caryl Pina, MD [facility] 03/27/2019, 1:08 PM

## 2019-03-27 NOTE — Telephone Encounter (Signed)
Spoke to pt's daughter and he was positive on 03/19/19 so advised her he would need to wait 90 days and she also states he needs order stating this as well as order for nasal saline spray faxed over to [facility]. Advised we will get order and fax over.

## 2019-03-27 NOTE — Telephone Encounter (Signed)
That does change things, I would need to know when he came positive before we can get the vaccine.  According to what I can find online it says he has to wait 90 days after infection before getting the vaccine.  I do not know if asymptomatic infection still counts the same but that is all I can find is that he will likely have to wait 90 days.

## 2019-03-27 NOTE — Telephone Encounter (Signed)
Spoke with the pt's daughter and she states the pt is currently COVID positive but asymptomatic. She will call back with the date of the positive test. Is it still ok to get vaccine? Should he wait a certain amount of days after the positive result prior to getting vaccine? If so how many days?

## 2019-03-28 ENCOUNTER — Telehealth: Payer: Self-pay | Admitting: Family Medicine

## 2019-03-28 NOTE — Telephone Encounter (Signed)
COVID19 [phone]

## 2019-04-05 ENCOUNTER — Telehealth: Payer: Self-pay | Admitting: Family Medicine

## 2019-04-05 NOTE — Telephone Encounter (Signed)
Letter faxed- aware

## 2019-04-05 NOTE — Telephone Encounter (Signed)
Mark Benjamin  Aware of new guidelines and that Janett Billow is sending letter to [employer] today

## 2019-04-05 NOTE — Telephone Encounter (Signed)
Is this okay?

## 2019-04-05 NOTE — Telephone Encounter (Signed)
lmtcb

## 2019-04-05 NOTE — Telephone Encounter (Signed)
We have actually found out some more information and the health department is saying 30 days after is fine, go ahead and do a letter saying that he can get the vaccine 30 days after his positive test and that is fine. Caryl Pina, MD [facility] 04/05/2019, 1:51 PM

## 2019-04-19 DIAGNOSIS — I10 Essential (primary) hypertension: Secondary | ICD-10-CM | POA: Diagnosis not present

## 2019-04-19 DIAGNOSIS — M4800 Spinal stenosis, site unspecified: Secondary | ICD-10-CM | POA: Diagnosis not present

## 2019-04-19 DIAGNOSIS — M6281 Muscle weakness (generalized): Secondary | ICD-10-CM | POA: Diagnosis not present

## 2019-04-19 DIAGNOSIS — M79622 Pain in left upper arm: Secondary | ICD-10-CM | POA: Diagnosis not present

## 2019-04-19 DIAGNOSIS — G20 Parkinson's disease: Secondary | ICD-10-CM | POA: Diagnosis not present

## 2019-04-19 DIAGNOSIS — F209 Schizophrenia, unspecified: Secondary | ICD-10-CM | POA: Diagnosis not present

## 2019-04-20 DIAGNOSIS — G20 Parkinson's disease: Secondary | ICD-10-CM | POA: Diagnosis not present

## 2019-04-20 DIAGNOSIS — E559 Vitamin D deficiency, unspecified: Secondary | ICD-10-CM | POA: Diagnosis not present

## 2019-04-20 DIAGNOSIS — M4802 Spinal stenosis, cervical region: Secondary | ICD-10-CM | POA: Diagnosis not present

## 2019-04-20 DIAGNOSIS — U071 COVID-19: Secondary | ICD-10-CM | POA: Diagnosis not present

## 2019-04-20 DIAGNOSIS — M6281 Muscle weakness (generalized): Secondary | ICD-10-CM | POA: Diagnosis not present

## 2019-04-20 DIAGNOSIS — F209 Schizophrenia, unspecified: Secondary | ICD-10-CM | POA: Diagnosis not present

## 2019-04-20 DIAGNOSIS — I10 Essential (primary) hypertension: Secondary | ICD-10-CM | POA: Diagnosis not present

## 2019-04-20 DIAGNOSIS — M79622 Pain in left upper arm: Secondary | ICD-10-CM | POA: Diagnosis not present

## 2019-04-23 DIAGNOSIS — F209 Schizophrenia, unspecified: Secondary | ICD-10-CM | POA: Diagnosis not present

## 2019-04-23 DIAGNOSIS — U071 COVID-19: Secondary | ICD-10-CM | POA: Diagnosis not present

## 2019-04-23 DIAGNOSIS — M79622 Pain in left upper arm: Secondary | ICD-10-CM | POA: Diagnosis not present

## 2019-04-23 DIAGNOSIS — I10 Essential (primary) hypertension: Secondary | ICD-10-CM | POA: Diagnosis not present

## 2019-04-23 DIAGNOSIS — M6281 Muscle weakness (generalized): Secondary | ICD-10-CM | POA: Diagnosis not present

## 2019-04-23 DIAGNOSIS — G20 Parkinson's disease: Secondary | ICD-10-CM | POA: Diagnosis not present

## 2019-04-25 DIAGNOSIS — I10 Essential (primary) hypertension: Secondary | ICD-10-CM | POA: Diagnosis not present

## 2019-04-25 DIAGNOSIS — M79622 Pain in left upper arm: Secondary | ICD-10-CM | POA: Diagnosis not present

## 2019-04-25 DIAGNOSIS — M6281 Muscle weakness (generalized): Secondary | ICD-10-CM | POA: Diagnosis not present

## 2019-04-25 DIAGNOSIS — F209 Schizophrenia, unspecified: Secondary | ICD-10-CM | POA: Diagnosis not present

## 2019-04-25 DIAGNOSIS — G20 Parkinson's disease: Secondary | ICD-10-CM | POA: Diagnosis not present

## 2019-04-25 DIAGNOSIS — U071 COVID-19: Secondary | ICD-10-CM | POA: Diagnosis not present

## 2019-04-26 DIAGNOSIS — M6281 Muscle weakness (generalized): Secondary | ICD-10-CM | POA: Diagnosis not present

## 2019-04-26 DIAGNOSIS — U071 COVID-19: Secondary | ICD-10-CM | POA: Diagnosis not present

## 2019-04-26 DIAGNOSIS — G20 Parkinson's disease: Secondary | ICD-10-CM | POA: Diagnosis not present

## 2019-04-26 DIAGNOSIS — I10 Essential (primary) hypertension: Secondary | ICD-10-CM | POA: Diagnosis not present

## 2019-04-26 DIAGNOSIS — M79622 Pain in left upper arm: Secondary | ICD-10-CM | POA: Diagnosis not present

## 2019-04-26 DIAGNOSIS — F209 Schizophrenia, unspecified: Secondary | ICD-10-CM | POA: Diagnosis not present

## 2019-04-27 DIAGNOSIS — M6281 Muscle weakness (generalized): Secondary | ICD-10-CM | POA: Diagnosis not present

## 2019-04-27 DIAGNOSIS — G20 Parkinson's disease: Secondary | ICD-10-CM | POA: Diagnosis not present

## 2019-04-27 DIAGNOSIS — U071 COVID-19: Secondary | ICD-10-CM | POA: Diagnosis not present

## 2019-04-27 DIAGNOSIS — M79622 Pain in left upper arm: Secondary | ICD-10-CM | POA: Diagnosis not present

## 2019-04-27 DIAGNOSIS — I10 Essential (primary) hypertension: Secondary | ICD-10-CM | POA: Diagnosis not present

## 2019-04-27 DIAGNOSIS — F209 Schizophrenia, unspecified: Secondary | ICD-10-CM | POA: Diagnosis not present

## 2019-05-01 DIAGNOSIS — M79622 Pain in left upper arm: Secondary | ICD-10-CM | POA: Diagnosis not present

## 2019-05-01 DIAGNOSIS — G20 Parkinson's disease: Secondary | ICD-10-CM | POA: Diagnosis not present

## 2019-05-01 DIAGNOSIS — U071 COVID-19: Secondary | ICD-10-CM | POA: Diagnosis not present

## 2019-05-01 DIAGNOSIS — I10 Essential (primary) hypertension: Secondary | ICD-10-CM | POA: Diagnosis not present

## 2019-05-01 DIAGNOSIS — F209 Schizophrenia, unspecified: Secondary | ICD-10-CM | POA: Diagnosis not present

## 2019-05-01 DIAGNOSIS — M6281 Muscle weakness (generalized): Secondary | ICD-10-CM | POA: Diagnosis not present

## 2019-05-02 DIAGNOSIS — U071 COVID-19: Secondary | ICD-10-CM | POA: Diagnosis not present

## 2019-05-02 DIAGNOSIS — M6281 Muscle weakness (generalized): Secondary | ICD-10-CM | POA: Diagnosis not present

## 2019-05-02 DIAGNOSIS — I10 Essential (primary) hypertension: Secondary | ICD-10-CM | POA: Diagnosis not present

## 2019-05-02 DIAGNOSIS — F209 Schizophrenia, unspecified: Secondary | ICD-10-CM | POA: Diagnosis not present

## 2019-05-02 DIAGNOSIS — M79622 Pain in left upper arm: Secondary | ICD-10-CM | POA: Diagnosis not present

## 2019-05-02 DIAGNOSIS — G20 Parkinson's disease: Secondary | ICD-10-CM | POA: Diagnosis not present

## 2019-05-03 DIAGNOSIS — G20 Parkinson's disease: Secondary | ICD-10-CM | POA: Diagnosis not present

## 2019-05-03 DIAGNOSIS — U071 COVID-19: Secondary | ICD-10-CM | POA: Diagnosis not present

## 2019-05-03 DIAGNOSIS — M79622 Pain in left upper arm: Secondary | ICD-10-CM | POA: Diagnosis not present

## 2019-05-03 DIAGNOSIS — I10 Essential (primary) hypertension: Secondary | ICD-10-CM | POA: Diagnosis not present

## 2019-05-03 DIAGNOSIS — M6281 Muscle weakness (generalized): Secondary | ICD-10-CM | POA: Diagnosis not present

## 2019-05-03 DIAGNOSIS — F209 Schizophrenia, unspecified: Secondary | ICD-10-CM | POA: Diagnosis not present

## 2019-05-04 DIAGNOSIS — I10 Essential (primary) hypertension: Secondary | ICD-10-CM | POA: Diagnosis not present

## 2019-05-04 DIAGNOSIS — U071 COVID-19: Secondary | ICD-10-CM | POA: Diagnosis not present

## 2019-05-04 DIAGNOSIS — G20 Parkinson's disease: Secondary | ICD-10-CM | POA: Diagnosis not present

## 2019-05-04 DIAGNOSIS — M6281 Muscle weakness (generalized): Secondary | ICD-10-CM | POA: Diagnosis not present

## 2019-05-04 DIAGNOSIS — F209 Schizophrenia, unspecified: Secondary | ICD-10-CM | POA: Diagnosis not present

## 2019-05-04 DIAGNOSIS — M79622 Pain in left upper arm: Secondary | ICD-10-CM | POA: Diagnosis not present

## 2019-05-08 DIAGNOSIS — U071 COVID-19: Secondary | ICD-10-CM | POA: Diagnosis not present

## 2019-05-08 DIAGNOSIS — M79622 Pain in left upper arm: Secondary | ICD-10-CM | POA: Diagnosis not present

## 2019-05-08 DIAGNOSIS — M6281 Muscle weakness (generalized): Secondary | ICD-10-CM | POA: Diagnosis not present

## 2019-05-08 DIAGNOSIS — G20 Parkinson's disease: Secondary | ICD-10-CM | POA: Diagnosis not present

## 2019-05-08 DIAGNOSIS — I10 Essential (primary) hypertension: Secondary | ICD-10-CM | POA: Diagnosis not present

## 2019-05-08 DIAGNOSIS — F209 Schizophrenia, unspecified: Secondary | ICD-10-CM | POA: Diagnosis not present

## 2019-05-09 DIAGNOSIS — F209 Schizophrenia, unspecified: Secondary | ICD-10-CM | POA: Diagnosis not present

## 2019-05-09 DIAGNOSIS — M6281 Muscle weakness (generalized): Secondary | ICD-10-CM | POA: Diagnosis not present

## 2019-05-09 DIAGNOSIS — M79622 Pain in left upper arm: Secondary | ICD-10-CM | POA: Diagnosis not present

## 2019-05-09 DIAGNOSIS — I10 Essential (primary) hypertension: Secondary | ICD-10-CM | POA: Diagnosis not present

## 2019-05-09 DIAGNOSIS — G20 Parkinson's disease: Secondary | ICD-10-CM | POA: Diagnosis not present

## 2019-05-09 DIAGNOSIS — U071 COVID-19: Secondary | ICD-10-CM | POA: Diagnosis not present

## 2019-05-11 DIAGNOSIS — I10 Essential (primary) hypertension: Secondary | ICD-10-CM | POA: Diagnosis not present

## 2019-05-11 DIAGNOSIS — F209 Schizophrenia, unspecified: Secondary | ICD-10-CM | POA: Diagnosis not present

## 2019-05-11 DIAGNOSIS — G20 Parkinson's disease: Secondary | ICD-10-CM | POA: Diagnosis not present

## 2019-05-11 DIAGNOSIS — U071 COVID-19: Secondary | ICD-10-CM | POA: Diagnosis not present

## 2019-05-11 DIAGNOSIS — M6281 Muscle weakness (generalized): Secondary | ICD-10-CM | POA: Diagnosis not present

## 2019-05-11 DIAGNOSIS — M79622 Pain in left upper arm: Secondary | ICD-10-CM | POA: Diagnosis not present

## 2019-05-14 ENCOUNTER — Ambulatory Visit: Payer: Medicare Other | Admitting: Diagnostic Neuroimaging

## 2019-05-14 ENCOUNTER — Encounter: Payer: Self-pay | Admitting: Diagnostic Neuroimaging

## 2019-05-15 DIAGNOSIS — G20 Parkinson's disease: Secondary | ICD-10-CM | POA: Diagnosis not present

## 2019-05-15 DIAGNOSIS — U071 COVID-19: Secondary | ICD-10-CM | POA: Diagnosis not present

## 2019-05-15 DIAGNOSIS — M79622 Pain in left upper arm: Secondary | ICD-10-CM | POA: Diagnosis not present

## 2019-05-15 DIAGNOSIS — M6281 Muscle weakness (generalized): Secondary | ICD-10-CM | POA: Diagnosis not present

## 2019-05-15 DIAGNOSIS — I10 Essential (primary) hypertension: Secondary | ICD-10-CM | POA: Diagnosis not present

## 2019-05-15 DIAGNOSIS — F209 Schizophrenia, unspecified: Secondary | ICD-10-CM | POA: Diagnosis not present

## 2019-05-17 DIAGNOSIS — M6281 Muscle weakness (generalized): Secondary | ICD-10-CM | POA: Diagnosis not present

## 2019-05-17 DIAGNOSIS — I10 Essential (primary) hypertension: Secondary | ICD-10-CM | POA: Diagnosis not present

## 2019-05-17 DIAGNOSIS — G20 Parkinson's disease: Secondary | ICD-10-CM | POA: Diagnosis not present

## 2019-05-17 DIAGNOSIS — M79622 Pain in left upper arm: Secondary | ICD-10-CM | POA: Diagnosis not present

## 2019-05-17 DIAGNOSIS — F209 Schizophrenia, unspecified: Secondary | ICD-10-CM | POA: Diagnosis not present

## 2019-05-17 DIAGNOSIS — U071 COVID-19: Secondary | ICD-10-CM | POA: Diagnosis not present

## 2019-05-20 DIAGNOSIS — M4802 Spinal stenosis, cervical region: Secondary | ICD-10-CM | POA: Diagnosis not present

## 2019-05-20 DIAGNOSIS — M6281 Muscle weakness (generalized): Secondary | ICD-10-CM | POA: Diagnosis not present

## 2019-05-20 DIAGNOSIS — G20 Parkinson's disease: Secondary | ICD-10-CM | POA: Diagnosis not present

## 2019-05-20 DIAGNOSIS — M79622 Pain in left upper arm: Secondary | ICD-10-CM | POA: Diagnosis not present

## 2019-05-20 DIAGNOSIS — U071 COVID-19: Secondary | ICD-10-CM | POA: Diagnosis not present

## 2019-05-20 DIAGNOSIS — I10 Essential (primary) hypertension: Secondary | ICD-10-CM | POA: Diagnosis not present

## 2019-05-20 DIAGNOSIS — E559 Vitamin D deficiency, unspecified: Secondary | ICD-10-CM | POA: Diagnosis not present

## 2019-05-20 DIAGNOSIS — F209 Schizophrenia, unspecified: Secondary | ICD-10-CM | POA: Diagnosis not present

## 2019-05-23 DIAGNOSIS — I10 Essential (primary) hypertension: Secondary | ICD-10-CM | POA: Diagnosis not present

## 2019-05-23 DIAGNOSIS — U071 COVID-19: Secondary | ICD-10-CM | POA: Diagnosis not present

## 2019-05-23 DIAGNOSIS — G20 Parkinson's disease: Secondary | ICD-10-CM | POA: Diagnosis not present

## 2019-05-23 DIAGNOSIS — F209 Schizophrenia, unspecified: Secondary | ICD-10-CM | POA: Diagnosis not present

## 2019-05-23 DIAGNOSIS — M6281 Muscle weakness (generalized): Secondary | ICD-10-CM | POA: Diagnosis not present

## 2019-05-23 DIAGNOSIS — M79622 Pain in left upper arm: Secondary | ICD-10-CM | POA: Diagnosis not present

## 2019-05-25 DIAGNOSIS — F209 Schizophrenia, unspecified: Secondary | ICD-10-CM | POA: Diagnosis not present

## 2019-05-25 DIAGNOSIS — G20 Parkinson's disease: Secondary | ICD-10-CM | POA: Diagnosis not present

## 2019-05-25 DIAGNOSIS — M6281 Muscle weakness (generalized): Secondary | ICD-10-CM | POA: Diagnosis not present

## 2019-05-25 DIAGNOSIS — I10 Essential (primary) hypertension: Secondary | ICD-10-CM | POA: Diagnosis not present

## 2019-05-25 DIAGNOSIS — M79622 Pain in left upper arm: Secondary | ICD-10-CM | POA: Diagnosis not present

## 2019-05-25 DIAGNOSIS — U071 COVID-19: Secondary | ICD-10-CM | POA: Diagnosis not present

## 2019-05-28 DIAGNOSIS — M6281 Muscle weakness (generalized): Secondary | ICD-10-CM | POA: Diagnosis not present

## 2019-05-28 DIAGNOSIS — U071 COVID-19: Secondary | ICD-10-CM | POA: Diagnosis not present

## 2019-05-28 DIAGNOSIS — F209 Schizophrenia, unspecified: Secondary | ICD-10-CM | POA: Diagnosis not present

## 2019-05-28 DIAGNOSIS — M79622 Pain in left upper arm: Secondary | ICD-10-CM | POA: Diagnosis not present

## 2019-05-28 DIAGNOSIS — G20 Parkinson's disease: Secondary | ICD-10-CM | POA: Diagnosis not present

## 2019-05-28 DIAGNOSIS — I10 Essential (primary) hypertension: Secondary | ICD-10-CM | POA: Diagnosis not present

## 2019-05-30 DIAGNOSIS — G20 Parkinson's disease: Secondary | ICD-10-CM | POA: Diagnosis not present

## 2019-05-30 DIAGNOSIS — U071 COVID-19: Secondary | ICD-10-CM | POA: Diagnosis not present

## 2019-05-30 DIAGNOSIS — M79622 Pain in left upper arm: Secondary | ICD-10-CM | POA: Diagnosis not present

## 2019-05-30 DIAGNOSIS — M6281 Muscle weakness (generalized): Secondary | ICD-10-CM | POA: Diagnosis not present

## 2019-05-30 DIAGNOSIS — F209 Schizophrenia, unspecified: Secondary | ICD-10-CM | POA: Diagnosis not present

## 2019-05-30 DIAGNOSIS — I10 Essential (primary) hypertension: Secondary | ICD-10-CM | POA: Diagnosis not present

## 2019-06-01 DIAGNOSIS — G20 Parkinson's disease: Secondary | ICD-10-CM | POA: Diagnosis not present

## 2019-06-01 DIAGNOSIS — U071 COVID-19: Secondary | ICD-10-CM | POA: Diagnosis not present

## 2019-06-01 DIAGNOSIS — F209 Schizophrenia, unspecified: Secondary | ICD-10-CM | POA: Diagnosis not present

## 2019-06-01 DIAGNOSIS — I10 Essential (primary) hypertension: Secondary | ICD-10-CM | POA: Diagnosis not present

## 2019-06-01 DIAGNOSIS — M79622 Pain in left upper arm: Secondary | ICD-10-CM | POA: Diagnosis not present

## 2019-06-01 DIAGNOSIS — M6281 Muscle weakness (generalized): Secondary | ICD-10-CM | POA: Diagnosis not present

## 2019-06-06 DIAGNOSIS — G20 Parkinson's disease: Secondary | ICD-10-CM | POA: Diagnosis not present

## 2019-06-06 DIAGNOSIS — M79622 Pain in left upper arm: Secondary | ICD-10-CM | POA: Diagnosis not present

## 2019-06-06 DIAGNOSIS — M6281 Muscle weakness (generalized): Secondary | ICD-10-CM | POA: Diagnosis not present

## 2019-06-06 DIAGNOSIS — U071 COVID-19: Secondary | ICD-10-CM | POA: Diagnosis not present

## 2019-06-06 DIAGNOSIS — F209 Schizophrenia, unspecified: Secondary | ICD-10-CM | POA: Diagnosis not present

## 2019-06-06 DIAGNOSIS — I10 Essential (primary) hypertension: Secondary | ICD-10-CM | POA: Diagnosis not present

## 2019-06-08 DIAGNOSIS — F209 Schizophrenia, unspecified: Secondary | ICD-10-CM | POA: Diagnosis not present

## 2019-06-08 DIAGNOSIS — M6281 Muscle weakness (generalized): Secondary | ICD-10-CM | POA: Diagnosis not present

## 2019-06-08 DIAGNOSIS — M79622 Pain in left upper arm: Secondary | ICD-10-CM | POA: Diagnosis not present

## 2019-06-08 DIAGNOSIS — I10 Essential (primary) hypertension: Secondary | ICD-10-CM | POA: Diagnosis not present

## 2019-06-08 DIAGNOSIS — U071 COVID-19: Secondary | ICD-10-CM | POA: Diagnosis not present

## 2019-06-08 DIAGNOSIS — G20 Parkinson's disease: Secondary | ICD-10-CM | POA: Diagnosis not present

## 2019-06-13 DIAGNOSIS — G20 Parkinson's disease: Secondary | ICD-10-CM | POA: Diagnosis not present

## 2019-06-13 DIAGNOSIS — M79622 Pain in left upper arm: Secondary | ICD-10-CM | POA: Diagnosis not present

## 2019-06-13 DIAGNOSIS — U071 COVID-19: Secondary | ICD-10-CM | POA: Diagnosis not present

## 2019-06-13 DIAGNOSIS — I10 Essential (primary) hypertension: Secondary | ICD-10-CM | POA: Diagnosis not present

## 2019-06-13 DIAGNOSIS — M6281 Muscle weakness (generalized): Secondary | ICD-10-CM | POA: Diagnosis not present

## 2019-06-13 DIAGNOSIS — F209 Schizophrenia, unspecified: Secondary | ICD-10-CM | POA: Diagnosis not present

## 2019-06-14 DIAGNOSIS — M6281 Muscle weakness (generalized): Secondary | ICD-10-CM | POA: Diagnosis not present

## 2019-06-14 DIAGNOSIS — F209 Schizophrenia, unspecified: Secondary | ICD-10-CM | POA: Diagnosis not present

## 2019-06-14 DIAGNOSIS — U071 COVID-19: Secondary | ICD-10-CM | POA: Diagnosis not present

## 2019-06-14 DIAGNOSIS — M79622 Pain in left upper arm: Secondary | ICD-10-CM | POA: Diagnosis not present

## 2019-06-14 DIAGNOSIS — I10 Essential (primary) hypertension: Secondary | ICD-10-CM | POA: Diagnosis not present

## 2019-06-14 DIAGNOSIS — G20 Parkinson's disease: Secondary | ICD-10-CM | POA: Diagnosis not present

## 2019-06-19 DIAGNOSIS — M6281 Muscle weakness (generalized): Secondary | ICD-10-CM | POA: Diagnosis not present

## 2019-06-19 DIAGNOSIS — I69922 Dysarthria following unspecified cerebrovascular disease: Secondary | ICD-10-CM | POA: Diagnosis not present

## 2019-06-19 DIAGNOSIS — G20 Parkinson's disease: Secondary | ICD-10-CM | POA: Diagnosis not present

## 2019-06-19 DIAGNOSIS — M79622 Pain in left upper arm: Secondary | ICD-10-CM | POA: Diagnosis not present

## 2019-06-19 DIAGNOSIS — Z8616 Personal history of COVID-19: Secondary | ICD-10-CM | POA: Diagnosis not present

## 2019-06-19 DIAGNOSIS — M4802 Spinal stenosis, cervical region: Secondary | ICD-10-CM | POA: Diagnosis not present

## 2019-06-19 DIAGNOSIS — E559 Vitamin D deficiency, unspecified: Secondary | ICD-10-CM | POA: Diagnosis not present

## 2019-06-19 DIAGNOSIS — I10 Essential (primary) hypertension: Secondary | ICD-10-CM | POA: Diagnosis not present

## 2019-06-19 DIAGNOSIS — F209 Schizophrenia, unspecified: Secondary | ICD-10-CM | POA: Diagnosis not present

## 2019-06-21 DIAGNOSIS — F209 Schizophrenia, unspecified: Secondary | ICD-10-CM | POA: Diagnosis not present

## 2019-06-21 DIAGNOSIS — M6281 Muscle weakness (generalized): Secondary | ICD-10-CM | POA: Diagnosis not present

## 2019-06-21 DIAGNOSIS — M4802 Spinal stenosis, cervical region: Secondary | ICD-10-CM | POA: Diagnosis not present

## 2019-06-21 DIAGNOSIS — I10 Essential (primary) hypertension: Secondary | ICD-10-CM | POA: Diagnosis not present

## 2019-06-21 DIAGNOSIS — G20 Parkinson's disease: Secondary | ICD-10-CM | POA: Diagnosis not present

## 2019-06-21 DIAGNOSIS — E559 Vitamin D deficiency, unspecified: Secondary | ICD-10-CM | POA: Diagnosis not present

## 2019-06-27 ENCOUNTER — Other Ambulatory Visit: Payer: Self-pay

## 2019-06-27 ENCOUNTER — Ambulatory Visit (INDEPENDENT_AMBULATORY_CARE_PROVIDER_SITE_OTHER): Payer: Medicare Other

## 2019-06-27 DIAGNOSIS — M6281 Muscle weakness (generalized): Secondary | ICD-10-CM | POA: Diagnosis not present

## 2019-06-27 DIAGNOSIS — M79622 Pain in left upper arm: Secondary | ICD-10-CM

## 2019-06-27 DIAGNOSIS — E559 Vitamin D deficiency, unspecified: Secondary | ICD-10-CM

## 2019-06-27 DIAGNOSIS — Z8616 Personal history of COVID-19: Secondary | ICD-10-CM | POA: Diagnosis not present

## 2019-06-27 DIAGNOSIS — F209 Schizophrenia, unspecified: Secondary | ICD-10-CM | POA: Diagnosis not present

## 2019-06-27 DIAGNOSIS — I69922 Dysarthria following unspecified cerebrovascular disease: Secondary | ICD-10-CM | POA: Diagnosis not present

## 2019-06-27 DIAGNOSIS — I10 Essential (primary) hypertension: Secondary | ICD-10-CM | POA: Diagnosis not present

## 2019-06-27 DIAGNOSIS — G20 Parkinson's disease: Secondary | ICD-10-CM

## 2019-06-27 DIAGNOSIS — M4802 Spinal stenosis, cervical region: Secondary | ICD-10-CM | POA: Diagnosis not present

## 2019-06-29 DIAGNOSIS — I10 Essential (primary) hypertension: Secondary | ICD-10-CM | POA: Diagnosis not present

## 2019-06-29 DIAGNOSIS — E559 Vitamin D deficiency, unspecified: Secondary | ICD-10-CM | POA: Diagnosis not present

## 2019-06-29 DIAGNOSIS — M4802 Spinal stenosis, cervical region: Secondary | ICD-10-CM | POA: Diagnosis not present

## 2019-06-29 DIAGNOSIS — M6281 Muscle weakness (generalized): Secondary | ICD-10-CM | POA: Diagnosis not present

## 2019-06-29 DIAGNOSIS — F209 Schizophrenia, unspecified: Secondary | ICD-10-CM | POA: Diagnosis not present

## 2019-06-29 DIAGNOSIS — G20 Parkinson's disease: Secondary | ICD-10-CM | POA: Diagnosis not present

## 2019-07-03 DIAGNOSIS — E559 Vitamin D deficiency, unspecified: Secondary | ICD-10-CM | POA: Diagnosis not present

## 2019-07-03 DIAGNOSIS — G20 Parkinson's disease: Secondary | ICD-10-CM | POA: Diagnosis not present

## 2019-07-03 DIAGNOSIS — M6281 Muscle weakness (generalized): Secondary | ICD-10-CM | POA: Diagnosis not present

## 2019-07-03 DIAGNOSIS — F209 Schizophrenia, unspecified: Secondary | ICD-10-CM | POA: Diagnosis not present

## 2019-07-03 DIAGNOSIS — M4802 Spinal stenosis, cervical region: Secondary | ICD-10-CM | POA: Diagnosis not present

## 2019-07-03 DIAGNOSIS — I10 Essential (primary) hypertension: Secondary | ICD-10-CM | POA: Diagnosis not present

## 2019-07-05 DIAGNOSIS — M6281 Muscle weakness (generalized): Secondary | ICD-10-CM | POA: Diagnosis not present

## 2019-07-05 DIAGNOSIS — M4802 Spinal stenosis, cervical region: Secondary | ICD-10-CM | POA: Diagnosis not present

## 2019-07-05 DIAGNOSIS — I10 Essential (primary) hypertension: Secondary | ICD-10-CM | POA: Diagnosis not present

## 2019-07-05 DIAGNOSIS — F209 Schizophrenia, unspecified: Secondary | ICD-10-CM | POA: Diagnosis not present

## 2019-07-05 DIAGNOSIS — E559 Vitamin D deficiency, unspecified: Secondary | ICD-10-CM | POA: Diagnosis not present

## 2019-07-05 DIAGNOSIS — G20 Parkinson's disease: Secondary | ICD-10-CM | POA: Diagnosis not present

## 2019-07-10 DIAGNOSIS — F209 Schizophrenia, unspecified: Secondary | ICD-10-CM | POA: Diagnosis not present

## 2019-07-10 DIAGNOSIS — I10 Essential (primary) hypertension: Secondary | ICD-10-CM | POA: Diagnosis not present

## 2019-07-10 DIAGNOSIS — E559 Vitamin D deficiency, unspecified: Secondary | ICD-10-CM | POA: Diagnosis not present

## 2019-07-10 DIAGNOSIS — M6281 Muscle weakness (generalized): Secondary | ICD-10-CM | POA: Diagnosis not present

## 2019-07-10 DIAGNOSIS — G20 Parkinson's disease: Secondary | ICD-10-CM | POA: Diagnosis not present

## 2019-07-10 DIAGNOSIS — M4802 Spinal stenosis, cervical region: Secondary | ICD-10-CM | POA: Diagnosis not present

## 2019-07-17 DIAGNOSIS — M6281 Muscle weakness (generalized): Secondary | ICD-10-CM | POA: Diagnosis not present

## 2019-07-17 DIAGNOSIS — G20 Parkinson's disease: Secondary | ICD-10-CM | POA: Diagnosis not present

## 2019-07-17 DIAGNOSIS — I10 Essential (primary) hypertension: Secondary | ICD-10-CM | POA: Diagnosis not present

## 2019-07-17 DIAGNOSIS — E559 Vitamin D deficiency, unspecified: Secondary | ICD-10-CM | POA: Diagnosis not present

## 2019-07-17 DIAGNOSIS — F209 Schizophrenia, unspecified: Secondary | ICD-10-CM | POA: Diagnosis not present

## 2019-07-17 DIAGNOSIS — M4802 Spinal stenosis, cervical region: Secondary | ICD-10-CM | POA: Diagnosis not present

## 2019-07-19 DIAGNOSIS — F209 Schizophrenia, unspecified: Secondary | ICD-10-CM | POA: Diagnosis not present

## 2019-07-19 DIAGNOSIS — M4802 Spinal stenosis, cervical region: Secondary | ICD-10-CM | POA: Diagnosis not present

## 2019-07-19 DIAGNOSIS — Z8616 Personal history of COVID-19: Secondary | ICD-10-CM | POA: Diagnosis not present

## 2019-07-19 DIAGNOSIS — M6281 Muscle weakness (generalized): Secondary | ICD-10-CM | POA: Diagnosis not present

## 2019-07-19 DIAGNOSIS — M79622 Pain in left upper arm: Secondary | ICD-10-CM | POA: Diagnosis not present

## 2019-07-19 DIAGNOSIS — I69922 Dysarthria following unspecified cerebrovascular disease: Secondary | ICD-10-CM | POA: Diagnosis not present

## 2019-07-19 DIAGNOSIS — E559 Vitamin D deficiency, unspecified: Secondary | ICD-10-CM | POA: Diagnosis not present

## 2019-07-19 DIAGNOSIS — G20 Parkinson's disease: Secondary | ICD-10-CM | POA: Diagnosis not present

## 2019-07-19 DIAGNOSIS — I10 Essential (primary) hypertension: Secondary | ICD-10-CM | POA: Diagnosis not present

## 2019-07-24 DIAGNOSIS — I10 Essential (primary) hypertension: Secondary | ICD-10-CM | POA: Diagnosis not present

## 2019-07-24 DIAGNOSIS — G20 Parkinson's disease: Secondary | ICD-10-CM | POA: Diagnosis not present

## 2019-07-24 DIAGNOSIS — F209 Schizophrenia, unspecified: Secondary | ICD-10-CM | POA: Diagnosis not present

## 2019-07-24 DIAGNOSIS — E559 Vitamin D deficiency, unspecified: Secondary | ICD-10-CM | POA: Diagnosis not present

## 2019-07-24 DIAGNOSIS — M6281 Muscle weakness (generalized): Secondary | ICD-10-CM | POA: Diagnosis not present

## 2019-07-24 DIAGNOSIS — M4802 Spinal stenosis, cervical region: Secondary | ICD-10-CM | POA: Diagnosis not present

## 2019-07-27 DIAGNOSIS — U071 COVID-19: Secondary | ICD-10-CM | POA: Diagnosis not present

## 2019-07-27 DIAGNOSIS — Z20828 Contact with and (suspected) exposure to other viral communicable diseases: Secondary | ICD-10-CM | POA: Diagnosis not present

## 2019-07-31 DIAGNOSIS — M6281 Muscle weakness (generalized): Secondary | ICD-10-CM | POA: Diagnosis not present

## 2019-07-31 DIAGNOSIS — M4802 Spinal stenosis, cervical region: Secondary | ICD-10-CM | POA: Diagnosis not present

## 2019-07-31 DIAGNOSIS — G20 Parkinson's disease: Secondary | ICD-10-CM | POA: Diagnosis not present

## 2019-07-31 DIAGNOSIS — I10 Essential (primary) hypertension: Secondary | ICD-10-CM | POA: Diagnosis not present

## 2019-07-31 DIAGNOSIS — E559 Vitamin D deficiency, unspecified: Secondary | ICD-10-CM | POA: Diagnosis not present

## 2019-07-31 DIAGNOSIS — F209 Schizophrenia, unspecified: Secondary | ICD-10-CM | POA: Diagnosis not present

## 2019-08-07 DIAGNOSIS — M6281 Muscle weakness (generalized): Secondary | ICD-10-CM | POA: Diagnosis not present

## 2019-08-07 DIAGNOSIS — M4802 Spinal stenosis, cervical region: Secondary | ICD-10-CM | POA: Diagnosis not present

## 2019-08-07 DIAGNOSIS — F209 Schizophrenia, unspecified: Secondary | ICD-10-CM | POA: Diagnosis not present

## 2019-08-07 DIAGNOSIS — G20 Parkinson's disease: Secondary | ICD-10-CM | POA: Diagnosis not present

## 2019-08-07 DIAGNOSIS — I10 Essential (primary) hypertension: Secondary | ICD-10-CM | POA: Diagnosis not present

## 2019-08-07 DIAGNOSIS — E559 Vitamin D deficiency, unspecified: Secondary | ICD-10-CM | POA: Diagnosis not present

## 2019-08-16 ENCOUNTER — Other Ambulatory Visit: Payer: Self-pay

## 2019-08-16 ENCOUNTER — Encounter: Payer: Self-pay | Admitting: Family Medicine

## 2019-08-16 ENCOUNTER — Ambulatory Visit (INDEPENDENT_AMBULATORY_CARE_PROVIDER_SITE_OTHER): Payer: Medicare Other | Admitting: Family Medicine

## 2019-08-16 VITALS — BP 110/71 | HR 101 | Temp 98.3°F | Resp 20 | Ht 73.0 in | Wt 160.2 lb

## 2019-08-16 DIAGNOSIS — I10 Essential (primary) hypertension: Secondary | ICD-10-CM

## 2019-08-16 DIAGNOSIS — E7800 Pure hypercholesterolemia, unspecified: Secondary | ICD-10-CM

## 2019-08-16 DIAGNOSIS — G20 Parkinson's disease: Secondary | ICD-10-CM

## 2019-08-16 DIAGNOSIS — G959 Disease of spinal cord, unspecified: Secondary | ICD-10-CM

## 2019-08-16 DIAGNOSIS — F209 Schizophrenia, unspecified: Secondary | ICD-10-CM | POA: Diagnosis not present

## 2019-08-17 DIAGNOSIS — F209 Schizophrenia, unspecified: Secondary | ICD-10-CM | POA: Diagnosis not present

## 2019-08-17 DIAGNOSIS — G20 Parkinson's disease: Secondary | ICD-10-CM | POA: Diagnosis not present

## 2019-08-17 DIAGNOSIS — M6281 Muscle weakness (generalized): Secondary | ICD-10-CM | POA: Diagnosis not present

## 2019-08-17 DIAGNOSIS — M4802 Spinal stenosis, cervical region: Secondary | ICD-10-CM | POA: Diagnosis not present

## 2019-08-17 DIAGNOSIS — I10 Essential (primary) hypertension: Secondary | ICD-10-CM | POA: Diagnosis not present

## 2019-08-17 DIAGNOSIS — E559 Vitamin D deficiency, unspecified: Secondary | ICD-10-CM | POA: Diagnosis not present

## 2019-08-18 DIAGNOSIS — G20 Parkinson's disease: Secondary | ICD-10-CM | POA: Diagnosis not present

## 2019-08-21 ENCOUNTER — Encounter: Payer: Self-pay | Admitting: Family Medicine

## 2019-08-21 NOTE — Progress Notes (Addendum)
Subjective:  Patient ID: Mark Benjamin, male    DOB: 07-13-1960  Age: 59 y.o. MRN: [id number]  CC: No chief complaint on file. `  HPI Mark Benjamin presents for recheck of chronic concerns.  Patient in for follow-up of elevated cholesterol. Doing well without complaints on current medication. Denies side effects of statin including myalgia and arthralgia and nausea. Also in today for liver function testing. Currently no chest pain, shortness of breath or other cardiovascular related symptoms noted.   Follow-up of hypertension. Patient has no history of headache chest pain or shortness of breath or recent cough. Patient also denies symptoms of TIA such as numbness weakness lateralizing. Patient checks  blood pressure at home and has not had any elevated readings recently. Patient denies side effects from his medication. States taking it regularly.   Patient is also treated for parkinsonism with carbidopa levodopa, benztropine and followed by Dr. Andrey Spearman of neurology.  Patient is also followed by psychiatry for schizophrenia.  For this he is currently treated with risperidone.   Depression screen [facility] 2/9 08/16/2019 08/30/2018 05/04/2017  Decreased Interest 0 0 0  Down, Depressed, Hopeless 0 0 0  PHQ - 2 Score 0 0 0  Altered sleeping - - -  Tired, decreased energy - - -  Change in appetite - - -  Feeling bad or failure about yourself  - - -  Trouble concentrating - - -  Moving slowly or fidgety/restless - - -  Suicidal thoughts - - -  PHQ-9 Score - - -  Difficult doing work/chores - - -  Some recent data might be hidden    History Mark Benjamin has a past medical history of Cataract, Colon polyps, Dysphagia, Essential hypertension, benign, Fatty liver, Hyperplasia of prostate, 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, Other and unspecified hyperlipidemia, Parkinson disease ([place]), and Schizophrenia ([place]).   He has a past surgical history that includes Upper  gastrointestinal endoscopy (EGD ED); Colonoscopy (02/13/08); Colonoscopy (N/A, 07/10/2012); Urethral dilation (1965); and Cystoscopy with insertion of urolift (N/A, 10/24/2017).   His family history includes ALS in his father; Heart failure in his mother; Hyperlipidemia in his sister; Inflammatory bowel disease in his paternal grandfather.He reports that he has never smoked. He has never used smokeless tobacco. He reports that he does not drink alcohol or use drugs.    ROS Review of Systems  Constitutional: Negative.  Negative for fever.  HENT: Negative.   Eyes: Negative for visual disturbance.  Respiratory: Negative for cough and shortness of breath.   Cardiovascular: Negative for chest pain and leg swelling.  Gastrointestinal: Negative for abdominal pain, diarrhea, nausea and vomiting.  Genitourinary: Negative for difficulty urinating.  Musculoskeletal: Positive for neck pain. Negative for arthralgias and myalgias.  Skin: Negative for rash.  Neurological: Negative for headaches.  Psychiatric/Behavioral: Negative for sleep disturbance.    Objective:  BP 110/71   Pulse (!) 101   Temp 98.3 F (36.8 C) (Temporal)   Resp 20   Ht 6\' 1"  (1.854 m)   Wt 160 lb 4 oz (72.7 kg)   SpO2 97%   BMI 21.14 kg/m   BP Readings from Last 3 Encounters:  08/16/19 110/71  05/08/18 107/64  10/24/17 (!) 143/81    Wt Readings from Last 3 Encounters:  08/16/19 160 lb 4 oz (72.7 kg)  05/08/18 170 lb (77.1 kg)  10/24/17 170 lb (77.1 kg)     Physical Exam Vitals reviewed.  Constitutional:  Appearance: He is well-developed.  HENT:     Head: Normocephalic and atraumatic.     Right Ear: Tympanic membrane and external ear normal. No decreased hearing noted.     Left Ear: Tympanic membrane and external ear normal. No decreased hearing noted.     Mouth/Throat:     Pharynx: No oropharyngeal exudate or posterior oropharyngeal erythema.  Eyes:     Pupils: Pupils are equal, round, and reactive to  light.  Cardiovascular:     Rate and Rhythm: Normal rate and regular rhythm.     Heart sounds: No murmur.  Pulmonary:     Effort: No respiratory distress.     Breath sounds: Normal breath sounds.  Abdominal:     General: Bowel sounds are normal.     Palpations: Abdomen is soft. There is no mass.     Tenderness: There is no abdominal tenderness.  Musculoskeletal:     Cervical back: Normal range of motion and neck supple.       Assessment & Plan:   Diagnoses and all orders for this visit:  Essential hypertension  Pure hypercholesterolemia  Cervical myelopathy ([place])  Parkinson's disease ([place])  Schizophrenia, unspecified type ([place])       I have discontinued Mark Benjamin. Debo's Debrox. I am also having him maintain his benztropine, divalproex, docusate sodium, risperiDONE, Probiotic, Cranberry, carbidopa-levodopa, fosinopril-hydrochlorothiazide, multivitamin with minerals, risperiDONE, cholecalciferol, loperamide, Polyethyl Glycol-Propyl Glycol, acetaminophen, magnesium hydroxide, rosuvastatin, and Sodium Fluoride.  Allergies as of 08/16/2019      Reactions   Amoxicillin    Dizzy and blurred vision   Erythromycin    Sulfamethoxazole-trimethoprim Other (See Comments)   Extreme weakness/lethorgy   Zocor [simvastatin]       Medication List       Accurate as of Aug 16, 2019 11:59 PM. If you have any questions, ask your nurse or doctor.        STOP taking these medications   Debrox 6.5 % OTIC solution Generic drug: carbamide peroxide Stopped by: Mark Fraise, MD     TAKE these medications   acetaminophen 500 MG tablet Commonly known as: TYLENOL Take 1,000 mg by mouth every 6 (six) hours as needed (for fever.).   AZO Cranberry Gummies 500 MG Chew Generic drug: Cranberry Chew 500 mg by mouth daily.   benztropine 1 MG tablet Commonly known as: COGENTIN Take 1 mg by mouth 2 (two) times daily.   carbidopa-levodopa 25-100 MG tablet Commonly known as: SINEMET  IR Take 1 tab at 8am, 1 tab at 2pm, 1.5 tabs at bedtime. What changed:   how much to take  how to take this  when to take this  additional instructions   cholecalciferol 25 MCG (1000 UNIT) tablet Commonly known as: VITAMIN D Take 3,000 Units by mouth daily.   divalproex 250 MG DR tablet Commonly known as: DEPAKOTE Take 250 mg by mouth 2 (two) times daily.   docusate sodium 100 MG capsule Commonly known as: COLACE Take 100 mg by mouth daily.   fosinopril-hydrochlorothiazide 10-12.5 MG tablet Commonly known as: MONOPRIL-HCT Take 1 tablet by mouth daily.   loperamide 2 MG capsule Commonly known as: IMODIUM Take 2 mg by mouth See admin instructions. Give 1 capsule (2 mg) by mouth with each loose stool up to 8 doses in 24 hr   Lubricant Eye Drops 0.4-0.3 % Soln Generic drug: Polyethyl Glycol-Propyl Glycol Place 1 drop into both eyes 4 (four) times daily as needed (for dry/irritated eyes (wait 3-5 min between  different eye drops)).   magnesium hydroxide 400 MG/5ML suspension Commonly known as: MILK OF MAGNESIA Take 30 mLs by mouth daily as needed (for constipation.).   multivitamin with minerals Tabs tablet Take 1 tablet by mouth daily.   PreviDent 5000 Booster 1.1 % Pste Generic drug: Sodium Fluoride Place 1 application onto teeth at bedtime.   Probiotic Caps Take 1 capsule by mouth 2 (two) times daily. What changed: when to take this   risperiDONE 1 MG tablet Commonly known as: RISPERDAL Take 1 mg by mouth daily.   risperiDONE 2 MG tablet Commonly known as: RISPERDAL Take 2 mg by mouth at bedtime.   rosuvastatin 5 MG tablet Commonly known as: Crestor Take 0.5 tablets (2.5 mg total) by mouth every other day. Take 1/2 tablet on Monday, Wednesday and Friday        Follow-up: Return in about 1 year (around 08/15/2020), or if symptoms worsen or fail to improve.  Mark Benjamin, M.D.

## 2019-08-24 DIAGNOSIS — G959 Disease of spinal cord, unspecified: Secondary | ICD-10-CM | POA: Insufficient documentation

## 2019-08-24 NOTE — Addendum Note (Signed)
Addended by: Claretta Fraise on: 08/24/2019 04:51 PM   Modules accepted: Orders, Level of Service

## 2019-09-12 ENCOUNTER — Telehealth: Payer: Medicare Other | Admitting: Diagnostic Neuroimaging

## 2019-09-17 DIAGNOSIS — E559 Vitamin D deficiency, unspecified: Secondary | ICD-10-CM | POA: Diagnosis not present

## 2019-09-17 DIAGNOSIS — Z8616 Personal history of COVID-19: Secondary | ICD-10-CM | POA: Diagnosis not present

## 2019-09-17 DIAGNOSIS — I10 Essential (primary) hypertension: Secondary | ICD-10-CM | POA: Diagnosis not present

## 2019-09-17 DIAGNOSIS — M6281 Muscle weakness (generalized): Secondary | ICD-10-CM | POA: Diagnosis not present

## 2019-09-17 DIAGNOSIS — G20 Parkinson's disease: Secondary | ICD-10-CM | POA: Diagnosis not present

## 2019-09-17 DIAGNOSIS — M4802 Spinal stenosis, cervical region: Secondary | ICD-10-CM | POA: Diagnosis not present

## 2019-09-17 DIAGNOSIS — F209 Schizophrenia, unspecified: Secondary | ICD-10-CM | POA: Diagnosis not present

## 2019-09-17 DIAGNOSIS — I69922 Dysarthria following unspecified cerebrovascular disease: Secondary | ICD-10-CM | POA: Diagnosis not present

## 2019-09-18 DIAGNOSIS — E559 Vitamin D deficiency, unspecified: Secondary | ICD-10-CM | POA: Diagnosis not present

## 2019-09-18 DIAGNOSIS — F209 Schizophrenia, unspecified: Secondary | ICD-10-CM | POA: Diagnosis not present

## 2019-09-18 DIAGNOSIS — M6281 Muscle weakness (generalized): Secondary | ICD-10-CM | POA: Diagnosis not present

## 2019-09-18 DIAGNOSIS — G20 Parkinson's disease: Secondary | ICD-10-CM | POA: Diagnosis not present

## 2019-09-18 DIAGNOSIS — I10 Essential (primary) hypertension: Secondary | ICD-10-CM | POA: Diagnosis not present

## 2019-09-18 DIAGNOSIS — M4802 Spinal stenosis, cervical region: Secondary | ICD-10-CM | POA: Diagnosis not present

## 2019-09-20 DIAGNOSIS — M4802 Spinal stenosis, cervical region: Secondary | ICD-10-CM | POA: Diagnosis not present

## 2019-09-20 DIAGNOSIS — E559 Vitamin D deficiency, unspecified: Secondary | ICD-10-CM | POA: Diagnosis not present

## 2019-09-20 DIAGNOSIS — F209 Schizophrenia, unspecified: Secondary | ICD-10-CM | POA: Diagnosis not present

## 2019-09-20 DIAGNOSIS — G20 Parkinson's disease: Secondary | ICD-10-CM | POA: Diagnosis not present

## 2019-09-20 DIAGNOSIS — I10 Essential (primary) hypertension: Secondary | ICD-10-CM | POA: Diagnosis not present

## 2019-09-20 DIAGNOSIS — M6281 Muscle weakness (generalized): Secondary | ICD-10-CM | POA: Diagnosis not present

## 2019-09-25 DIAGNOSIS — M6281 Muscle weakness (generalized): Secondary | ICD-10-CM | POA: Diagnosis not present

## 2019-09-25 DIAGNOSIS — I10 Essential (primary) hypertension: Secondary | ICD-10-CM | POA: Diagnosis not present

## 2019-09-25 DIAGNOSIS — M4802 Spinal stenosis, cervical region: Secondary | ICD-10-CM | POA: Diagnosis not present

## 2019-09-25 DIAGNOSIS — E559 Vitamin D deficiency, unspecified: Secondary | ICD-10-CM | POA: Diagnosis not present

## 2019-09-25 DIAGNOSIS — G20 Parkinson's disease: Secondary | ICD-10-CM | POA: Diagnosis not present

## 2019-09-25 DIAGNOSIS — F209 Schizophrenia, unspecified: Secondary | ICD-10-CM | POA: Diagnosis not present

## 2019-09-26 DIAGNOSIS — I10 Essential (primary) hypertension: Secondary | ICD-10-CM | POA: Diagnosis not present

## 2019-09-26 DIAGNOSIS — G20 Parkinson's disease: Secondary | ICD-10-CM | POA: Diagnosis not present

## 2019-09-26 DIAGNOSIS — M4802 Spinal stenosis, cervical region: Secondary | ICD-10-CM | POA: Diagnosis not present

## 2019-09-26 DIAGNOSIS — F209 Schizophrenia, unspecified: Secondary | ICD-10-CM | POA: Diagnosis not present

## 2019-09-26 DIAGNOSIS — M6281 Muscle weakness (generalized): Secondary | ICD-10-CM | POA: Diagnosis not present

## 2019-09-26 DIAGNOSIS — E559 Vitamin D deficiency, unspecified: Secondary | ICD-10-CM | POA: Diagnosis not present

## 2019-09-28 DIAGNOSIS — B351 Tinea unguium: Secondary | ICD-10-CM | POA: Diagnosis not present

## 2019-09-28 DIAGNOSIS — M79675 Pain in left toe(s): Secondary | ICD-10-CM | POA: Diagnosis not present

## 2019-09-28 DIAGNOSIS — G629 Polyneuropathy, unspecified: Secondary | ICD-10-CM | POA: Diagnosis not present

## 2019-10-01 ENCOUNTER — Other Ambulatory Visit: Payer: Self-pay

## 2019-10-01 ENCOUNTER — Encounter: Payer: Self-pay | Admitting: Diagnostic Neuroimaging

## 2019-10-01 ENCOUNTER — Ambulatory Visit (INDEPENDENT_AMBULATORY_CARE_PROVIDER_SITE_OTHER): Payer: Medicare Other | Admitting: Diagnostic Neuroimaging

## 2019-10-01 ENCOUNTER — Telehealth: Payer: Self-pay

## 2019-10-01 VITALS — BP 112/76 | HR 76 | Ht 73.0 in | Wt 159.0 lb

## 2019-10-01 DIAGNOSIS — M4802 Spinal stenosis, cervical region: Secondary | ICD-10-CM

## 2019-10-01 DIAGNOSIS — G20 Parkinson's disease: Secondary | ICD-10-CM

## 2019-10-01 NOTE — Telephone Encounter (Signed)
Pt's sister Gavin Pound called office to confirm pt's appt this PM. Please call her at [phone].

## 2019-10-01 NOTE — Progress Notes (Signed)
[facility]  PATIENT: Mark Benjamin DOB: 09-18-1960  REFERRING CLINICIAN: Alphonse Guild HISTORY FROM: patient and sister REASON FOR VISIT: follow up   HISTORICAL  CHIEF COMPLAINT:  Chief Complaint  Patient presents with  . Parkinson's disease    rm 7 sister- [name], lives at [facility] [place] "worsening of symptoms- weakness, balance, left leg won't move; ongoing PT "    HISTORY OF PRESENT ILLNESS:   UPDATE (10/01/19, VRP): Since last visit, doing well overall, but some progressive of sxs. More left hand dystonia and pain. Symptoms are progressive. No alleviating or aggravating factors. Tolerating meds. More balance difficulty.    UPDATE (05/08/18, VRP): Since last visit, having more generalized weakness, malaise, and lack of interest in activities. Symptoms are progressive. More left hand stiffness and dystonia. No alleviating or aggravating factors. Tolerating meds.    UPDATE (08/01/17, VRP): Since last visit, doing well, with improve gait and walking. Tremor stable. Tolerating meds. No alleviating or aggravating factors. No delusions / hallucinations.  UPDATE (01/31/17, VRP): Since last visit, doing well. Tolerating carb/levo. No alleviating or aggravating factors.   UPDATE 09/29/16: Since last visit, tremors and coordination have worsened. Balance poor. Had a minor fall 3 weeks ago. Sister is looking at options for PACE vs assisted living. She is concerned about his safety to live alone. He is no longer driving. They were not able to afford [facility] ($14k out of pocket).   UPDATE 06/15/16: Since last visit, tremor, slowness, stiffness continue. Here to look at other options for treatment. Schizophrenia stable. More balance and coordination issues. Patient lives alone. Driving short distances.   UPDATE 09/22/15 (VRP): Since last visit, tremor and parkinsonism sxs continue. Saw Dr. Olga Millers, but was told that his spine issues are not amenable to surgical  treatment.   UPDATE 06/04/14 (VRP): Since last visit, patient has transitioned from thioridazine to risperdal and cogentin and depakote. Left arm symptoms are stable / slightly improved. Patient not interested in surgical treatment of c-spine surg; also, was told by neurosurg that his neck issues do not need surg decompression.  UPDATE 08/10/13 (LL): Patient states that he was evaluated by Dr. Vertell Limber for cervical stenosis and was told that there was nothing seen on Myelogram that needed surgery. His Thioridazine dose is now 100 mg. Symptoms are stable. He was advised by PCP to cut out artificial sweeteners to see if that changed his symptoms; he did so 2 weeks ago. He complains of mild left leg weakness.  UPDATE 02/26/13 (VRP): Since last visit patient had MRI of the brain and cervical spine which shows moderate spinal stenosis at C5-6 level with severe bilateral foraminal stenosis. Also mild spinal stenosis and moderate right foraminal stenosis at C6-7. No cord signal abnormalities. Since last visit thioridazine dosing has been reduced. Overall his symptoms are stable to slightly progressed.  PRIOR HPI (11/21/12, VRP): 59 year old left-handed male with history of diabetes, hypercholesterolemia, schizophrenia, on thioridazine since 1986, here for evaluation of left arm weakness and numbness since March 2014. Patient reports gradual onset, progressive numbness and weakness and slowness of his left arm. He denies any symptoms in his right arm or legs. No facial droop or facial numbness. He has noticed some slurred speech over the past one week. Patient is left-handed and has noticed difficulty with brushing his teeth with his left hand, handwriting and other fine movements. Patient's psychiatrist reduced thioridazine dose from 300 mg at bedtime down to 200 mg at bedtime last  month, out of consideration of the patient's presenting symptoms as a potential side effect of medication. Since reducing the dose, no  change in symptoms.   REVIEW OF SYSTEMS: Full 14 system review of systems performed and negative: as per HPI.   ALLERGIES: Allergies  Allergen Reactions  . Amoxicillin     Dizzy and blurred vision  . Erythromycin   . Sulfamethoxazole-Trimethoprim Other (See Comments)    Extreme weakness/lethorgy  . Zocor [Simvastatin]     HOME MEDICATIONS: Outpatient Medications Prior to Visit  Medication Sig Dispense Refill  . acetaminophen (TYLENOL) 500 MG tablet Take 1,000 mg by mouth every 6 (six) hours as needed (for fever.).    Marland Kitchen benztropine (COGENTIN) 1 MG tablet Take 1 mg by mouth 2 (two) times daily.     . carbidopa-levodopa (SINEMET IR) 25-100 MG tablet Take 1 tab at 8am, 1 tab at 2pm, 1.5 tabs at bedtime. (Patient taking differently: Take 1-1.5 tablets by mouth See admin instructions. TAKE 1 TABLET BY MOUTH AT 0800, TAKE 1 TABLET BY MOUTH AT 1400, & TAKE 1.5 TABLETS BY MOUTH AT BEDTIME.) 120 tablet 12  . cholecalciferol (VITAMIN D) 1000 units tablet Take 3,000 Units by mouth daily.    . divalproex (DEPAKOTE) 250 MG DR tablet Take 250 mg by mouth 2 (two) times daily.     Marland Kitchen docusate sodium (COLACE) 100 MG capsule Take 100 mg by mouth daily.     . fosinopril-hydrochlorothiazide (MONOPRIL-HCT) 10-12.5 MG tablet Take 1 tablet by mouth daily.    . magnesium hydroxide (MILK OF MAGNESIA) 400 MG/5ML suspension Take 30 mLs by mouth daily as needed (for constipation.).    Marland Kitchen Multiple Vitamin (MULTIVITAMIN WITH MINERALS) TABS tablet Take 1 tablet by mouth daily.    Vladimir Faster Glycol-Propyl Glycol (LUBRICANT EYE DROPS) 0.4-0.3 % SOLN Place 1 drop into both eyes 4 (four) times daily as needed (for dry/irritated eyes (wait 3-5 min between different eye drops)).     . Probiotic CAPS Take 1 capsule by mouth 2 (two) times daily. (Patient taking differently: Take 1 capsule by mouth daily. ) 60 capsule 5  . risperiDONE (RISPERDAL) 1 MG tablet Take 1 mg by mouth daily.     . risperiDONE (RISPERDAL) 2 MG tablet  Take 2 mg by mouth at bedtime.    . rosuvastatin (CRESTOR) 5 MG tablet Take 0.5 tablets (2.5 mg total) by mouth every other day. Take 1/2 tablet on Monday, Wednesday and Friday 45 tablet 3  . Sodium Fluoride (PREVIDENT 5000 BOOSTER) 1.1 % PSTE Place 1 application onto teeth at bedtime.    . Cranberry (AZO CRANBERRY GUMMIES) 500 MG CHEW Chew 500 mg by mouth daily.  (Patient not taking: Reported on 10/01/2019)    . loperamide (IMODIUM) 2 MG capsule Take 2 mg by mouth See admin instructions. Give 1 capsule (2 mg) by mouth with each loose stool up to 8 doses in 24 hr (Patient not taking: Reported on 10/01/2019)     No facility-administered medications prior to visit.    PAST MEDICAL HISTORY: Past Medical History:  Diagnosis Date  . Cataract   . Colon polyps   . Dysphagia   . Essential hypertension, benign   . Fatty liver   . Hyperplasia of prostate   . 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 Other and unspecified hyperlipidemia   . Parkinson disease ([place])   . Schizophrenia ([place])     PAST SURGICAL HISTORY: Past Surgical History:  Procedure Laterality  Date  . COLONOSCOPY  02/13/08  . COLONOSCOPY N/A 07/10/2012   Procedure: COLONOSCOPY;  Surgeon: Rogene Houston, MD;  Location: [facility] SUITE;  Service: Endoscopy;  Laterality: N/A;  225  . CYSTOSCOPY WITH INSERTION OF UROLIFT N/A 10/24/2017   Procedure: CYSTOSCOPY WITH INSERTION OF UROLIFT;  Surgeon: Cleon Gustin, MD;  Location: [facility] ORS;  Service: Urology;  Laterality: N/A;  . UPPER GASTROINTESTINAL ENDOSCOPY  EGD ED   09/09/2010  . URETHRAL DILATION  1965    FAMILY HISTORY: Family History  Problem Relation Age of Onset  . Heart failure Mother   . ALS Father   . Hyperlipidemia Sister   . Inflammatory bowel disease Paternal Grandfather     SOCIAL HISTORY:  Social History   Socioeconomic History  . Marital status: Single    Spouse name: Not on file  . Number of children: 0  . Years of education:  12th  . Highest education level: High school graduate  Occupational History    Employer: NOT EMPLOYED  Tobacco Use  . Smoking status: Never Smoker  . Smokeless tobacco: Never Used  Vaping Use  . Vaping Use: Never used  Substance and Sexual Activity  . Alcohol use: No  . Drug use: No  . Sexual activity: Not Currently    Birth control/protection: None  Other Topics Concern  . Not on file  Social History Narrative   Patient lives at [facility], which is an assisted living facility. His sister helps with his care. He has never been married and does not have children.    Social Determinants of Health   Financial Resource Strain:   . Difficulty of Paying Living Expenses:   Food Insecurity:   . Worried About Charity fundraiser in the Last Year:   . Arboriculturist in the Last Year:   Transportation Needs:   . Film/video editor (Medical):   Marland Kitchen Lack of Transportation (Non-Medical):   Physical Activity:   . Days of Exercise per Week:   . Minutes of Exercise per Session:   Stress:   . Feeling of Stress :   Social Connections:   . Frequency of Communication with Friends and Family:   . Frequency of Social Gatherings with Friends and Family:   . Attends Religious Services:   . Active Member of Clubs or Organizations:   . Attends Archivist Meetings:   Marland Kitchen Marital Status:   Intimate Partner Violence:   . Fear of Current or Ex-Partner:   . Emotionally Abused:   Marland Kitchen Physically Abused:   . Sexually Abused:      PHYSICAL EXAM  GENERAL EXAM/CONSTITUTIONAL: Vitals:  Vitals:   10/01/19 1534  BP: 112/76  Pulse: 76  Weight: 159 lb (72.1 kg)  Height: 6\' 1"  (1.854 m)     Body mass index is 20.98 kg/m. Wt Readings from Last 3 Encounters:  10/01/19 159 lb (72.1 kg)  08/16/19 160 lb 4 oz (72.7 kg)  05/08/18 170 lb (77.1 kg)     Patient is in no distress; well developed, nourished and groomed; neck is supple  CARDIOVASCULAR:  Examination of carotid arteries  is normal; no carotid bruits  Regular rate and rhythm, no murmurs  Examination of peripheral vascular system by observation and palpation is normal  EYES:  Ophthalmoscopic exam of optic discs and posterior segments is normal; no papilledema or hemorrhages  No exam data present  MUSCULOSKELETAL:  Gait, strength, tone, movements noted in Neurologic exam below  NEUROLOGIC: MENTAL STATUS:  MMSE - Mini Mental State Exam 05/04/2017 04/15/2016 04/14/2015  Orientation to time 5 5 5   Orientation to Place 5 5 5   Registration 3 3 3   Attention/ Calculation 5 5 5   Recall 3 3 2   Language- name 2 objects 2 2 2   Language- repeat 1 1 1   Language- follow 3 step command 3 3 3   Language- read & follow direction 1 1 1   Write a sentence 0 1 0  Write a sentence-comments unable to write due to problem with left hand - -  Copy design 0 1 0  Total score 28 30 27     awake, alert, oriented to person, place and time  recent and remote memory intact  normal attention and concentration  language fluent, comprehension intact, naming intact  fund of knowledge appropriate  CRANIAL NERVE:   2nd - no papilledema on fundoscopic exam  2nd, 3rd, 4th, 6th - pupils equal and reactive to light, visual fields full to confrontation, extraocular muscles intact, no nystagmus  5th - facial sensation symmetric  7th - facial strength symmetric  8th - hearing intact  9th - palate elevates symmetrically, uvula midline  11th - shoulder shrug symmetric  12th - tongue protrusion midline  STUTTERING SPEECH  MASKED FACIES  MOTOR:   INCREASED TONE IN BUE AND BLE; DYSTONIA IN LEFT HAND / ARM  MILD RESTING TREMOR IN LUE  BUE 4; BLE 3-4  SENSORY:   normal and symmetric to light touch  COORDINATION:   finger-nose-finger, fine finger movements normal  REFLEXES:   deep tendon reflexes TRACE and symmetric  GAIT/STATION:   SHORT STEPS      DIAGNOSTIC DATA (LABS, IMAGING, TESTING) - I  reviewed patient records, labs, notes, testing and imaging myself where available.  Lab Results  Component Value Date   WBC 5.1 11/28/2017   HGB 13.5 11/28/2017   HCT 40.6 11/28/2017   MCV 89 11/28/2017   PLT 228 11/28/2017      Component Value Date/Time   NA 139 11/28/2017 1528   K 3.7 11/28/2017 1528   CL 98 11/28/2017 1528   CO2 26 11/28/2017 1528   GLUCOSE 81 11/28/2017 1528   GLUCOSE 105 (H) 10/17/2017 1022   BUN 7 11/28/2017 1528   CREATININE 0.93 11/28/2017 1528   CREATININE 0.86 04/24/2013 0928   CALCIUM 9.8 11/28/2017 1528   PROT 6.8 01/27/2018 0828   ALBUMIN 4.6 01/27/2018 0828   AST 17 01/27/2018 0828   ALT 12 01/27/2018 0828   ALKPHOS 57 01/27/2018 0828   BILITOT 1.3 (H) 01/27/2018 0828   GFRNONAA 91 11/28/2017 1528   GFRNONAA >89 04/24/2013 0928   GFRAA 105 11/28/2017 1528   GFRAA >89 04/24/2013 0928   Lab Results  Component Value Date   CHOL 154 01/27/2018   HDL 51 01/27/2018   LDLCALC 92 01/27/2018   TRIG 56 01/27/2018   CHOLHDL 3.0 01/27/2018   Lab Results  Component Value Date   HGBA1C 4.8 11/08/2013   Lab Results  Component Value Date   [id number] 557 06/03/2016   Lab Results  Component Value Date   TSH 1.310 06/03/2016    11/29/12 MRI cervical  1. At C5-6: disc bulging with moderate spinal stenosis and severe biforaminal stenosis  2. At C3-4, C4-5, C6-7: disc bulging with mild spinal stenosis and severe biforaminal stenosis  3. No cord signal abnormalities.  11/29/12 MRI brain (without) demonstrating:  1. Single right frontal subcortical focus (8mm) of non-specific gliosis.  2. No acute findings.  04/12/13 CT myelogram cervical spine - Central disc herniation at C3-4 with moderate central stenosis. Severe left foraminal narrowing at this level. Moderate central stenosis at C5-6 and C6-7 secondary to posterior disc osteophytes.  06/05/15 MRI cervical spine [I reviewed images myself and agree with interpretation. -VRP]  1. At C3-4 there  is a mild broad-based disc bulge. Left uncovertebral degenerative change and left facet arthropathy resulting in severe left foraminal stenosis. Mild right foraminal stenosis. Mild spinal stenosis. 2. Cervical spine spondylosis as described above.     ASSESSMENT AND PLAN  59 y.o. year old male here with gradual onset, progressive left arm weakness, slowness and stiffness with some numbness. On exam, patient has masked facies, rigidity in the left arm, bradykinesia in the left arm, stooped posture and poor arm swing. Ordered [facility] to eval for parkinson's disease vs drug-induced parkinsonism --> but not affordable at this time   Dx: parkinsonism (likely idiopathic parkinson's disease; drug induced less likely as symptoms have continued to progress in spite of reducing anti-psychotic medications) + mild cervical spinal stenosis and cervical radiculopathy  Parkinson's disease ([place])  Spinal stenosis in cervical region    PLAN:  PARKINSON'S DISEASE (established problem, progressive) - continue carb/levo (1 tab 8am, 1 tab 2pm, 1.5tabs 8pm); cannot tolerate higher dose due to hallucinations - continue physical activity / exercises / PT - continue to use lowest possible dose of anti-psychotic medication (Dr. Letitia Caul; psychiatry)  LEFT HAND DYSTONIA - consider botox if severe pain develops in future - continue occupational therapy; continue wrist / hand splint  SIALORRHEA - consider botox in future  Return in about 1 year (around 09/30/2020).    Penni Bombard, MD 06/30/3011, 1:43 PM Certified in Neurology, Neurophysiology and Neuroimaging  [facility] [address], [place], [place] [address] [phone]

## 2019-10-01 NOTE — Telephone Encounter (Addendum)
Called sister, [name] and confirmed his follow up this afternoon. She stated his SNF [facility] is to be contacted first and then her as she is his transportation.  I advised they arrive 30 minutes early, wear a mask. [name] verbalized understanding, appreciation.

## 2019-10-02 DIAGNOSIS — M6281 Muscle weakness (generalized): Secondary | ICD-10-CM | POA: Diagnosis not present

## 2019-10-02 DIAGNOSIS — G20 Parkinson's disease: Secondary | ICD-10-CM | POA: Diagnosis not present

## 2019-10-02 DIAGNOSIS — E559 Vitamin D deficiency, unspecified: Secondary | ICD-10-CM | POA: Diagnosis not present

## 2019-10-02 DIAGNOSIS — F209 Schizophrenia, unspecified: Secondary | ICD-10-CM | POA: Diagnosis not present

## 2019-10-02 DIAGNOSIS — M4802 Spinal stenosis, cervical region: Secondary | ICD-10-CM | POA: Diagnosis not present

## 2019-10-02 DIAGNOSIS — I10 Essential (primary) hypertension: Secondary | ICD-10-CM | POA: Diagnosis not present

## 2019-10-03 DIAGNOSIS — M6281 Muscle weakness (generalized): Secondary | ICD-10-CM | POA: Diagnosis not present

## 2019-10-03 DIAGNOSIS — M4802 Spinal stenosis, cervical region: Secondary | ICD-10-CM | POA: Diagnosis not present

## 2019-10-03 DIAGNOSIS — F209 Schizophrenia, unspecified: Secondary | ICD-10-CM | POA: Diagnosis not present

## 2019-10-03 DIAGNOSIS — E559 Vitamin D deficiency, unspecified: Secondary | ICD-10-CM | POA: Diagnosis not present

## 2019-10-03 DIAGNOSIS — I10 Essential (primary) hypertension: Secondary | ICD-10-CM | POA: Diagnosis not present

## 2019-10-03 DIAGNOSIS — G20 Parkinson's disease: Secondary | ICD-10-CM | POA: Diagnosis not present

## 2019-10-04 DIAGNOSIS — F209 Schizophrenia, unspecified: Secondary | ICD-10-CM | POA: Diagnosis not present

## 2019-10-04 DIAGNOSIS — M6281 Muscle weakness (generalized): Secondary | ICD-10-CM | POA: Diagnosis not present

## 2019-10-04 DIAGNOSIS — G20 Parkinson's disease: Secondary | ICD-10-CM | POA: Diagnosis not present

## 2019-10-04 DIAGNOSIS — I10 Essential (primary) hypertension: Secondary | ICD-10-CM | POA: Diagnosis not present

## 2019-10-04 DIAGNOSIS — E559 Vitamin D deficiency, unspecified: Secondary | ICD-10-CM | POA: Diagnosis not present

## 2019-10-04 DIAGNOSIS — M4802 Spinal stenosis, cervical region: Secondary | ICD-10-CM | POA: Diagnosis not present

## 2019-10-08 DIAGNOSIS — F209 Schizophrenia, unspecified: Secondary | ICD-10-CM | POA: Diagnosis not present

## 2019-10-08 DIAGNOSIS — I10 Essential (primary) hypertension: Secondary | ICD-10-CM | POA: Diagnosis not present

## 2019-10-08 DIAGNOSIS — M6281 Muscle weakness (generalized): Secondary | ICD-10-CM | POA: Diagnosis not present

## 2019-10-08 DIAGNOSIS — M4802 Spinal stenosis, cervical region: Secondary | ICD-10-CM | POA: Diagnosis not present

## 2019-10-08 DIAGNOSIS — G20 Parkinson's disease: Secondary | ICD-10-CM | POA: Diagnosis not present

## 2019-10-08 DIAGNOSIS — E559 Vitamin D deficiency, unspecified: Secondary | ICD-10-CM | POA: Diagnosis not present

## 2019-10-09 DIAGNOSIS — E559 Vitamin D deficiency, unspecified: Secondary | ICD-10-CM | POA: Diagnosis not present

## 2019-10-09 DIAGNOSIS — M4802 Spinal stenosis, cervical region: Secondary | ICD-10-CM | POA: Diagnosis not present

## 2019-10-09 DIAGNOSIS — G20 Parkinson's disease: Secondary | ICD-10-CM | POA: Diagnosis not present

## 2019-10-09 DIAGNOSIS — I10 Essential (primary) hypertension: Secondary | ICD-10-CM | POA: Diagnosis not present

## 2019-10-09 DIAGNOSIS — M6281 Muscle weakness (generalized): Secondary | ICD-10-CM | POA: Diagnosis not present

## 2019-10-09 DIAGNOSIS — F209 Schizophrenia, unspecified: Secondary | ICD-10-CM | POA: Diagnosis not present

## 2019-10-16 DIAGNOSIS — E559 Vitamin D deficiency, unspecified: Secondary | ICD-10-CM | POA: Diagnosis not present

## 2019-10-16 DIAGNOSIS — I10 Essential (primary) hypertension: Secondary | ICD-10-CM | POA: Diagnosis not present

## 2019-10-16 DIAGNOSIS — M4802 Spinal stenosis, cervical region: Secondary | ICD-10-CM | POA: Diagnosis not present

## 2019-10-16 DIAGNOSIS — M6281 Muscle weakness (generalized): Secondary | ICD-10-CM | POA: Diagnosis not present

## 2019-10-16 DIAGNOSIS — F209 Schizophrenia, unspecified: Secondary | ICD-10-CM | POA: Diagnosis not present

## 2019-10-16 DIAGNOSIS — G20 Parkinson's disease: Secondary | ICD-10-CM | POA: Diagnosis not present

## 2019-10-17 DIAGNOSIS — M4802 Spinal stenosis, cervical region: Secondary | ICD-10-CM | POA: Diagnosis not present

## 2019-10-17 DIAGNOSIS — M6281 Muscle weakness (generalized): Secondary | ICD-10-CM | POA: Diagnosis not present

## 2019-10-17 DIAGNOSIS — E559 Vitamin D deficiency, unspecified: Secondary | ICD-10-CM | POA: Diagnosis not present

## 2019-10-17 DIAGNOSIS — G20 Parkinson's disease: Secondary | ICD-10-CM | POA: Diagnosis not present

## 2019-10-17 DIAGNOSIS — F209 Schizophrenia, unspecified: Secondary | ICD-10-CM | POA: Diagnosis not present

## 2019-10-17 DIAGNOSIS — I69922 Dysarthria following unspecified cerebrovascular disease: Secondary | ICD-10-CM | POA: Diagnosis not present

## 2019-10-17 DIAGNOSIS — I10 Essential (primary) hypertension: Secondary | ICD-10-CM | POA: Diagnosis not present

## 2019-10-18 DIAGNOSIS — M4802 Spinal stenosis, cervical region: Secondary | ICD-10-CM | POA: Diagnosis not present

## 2019-10-18 DIAGNOSIS — G20 Parkinson's disease: Secondary | ICD-10-CM | POA: Diagnosis not present

## 2019-10-18 DIAGNOSIS — I69922 Dysarthria following unspecified cerebrovascular disease: Secondary | ICD-10-CM | POA: Diagnosis not present

## 2019-10-18 DIAGNOSIS — I10 Essential (primary) hypertension: Secondary | ICD-10-CM | POA: Diagnosis not present

## 2019-10-18 DIAGNOSIS — M6281 Muscle weakness (generalized): Secondary | ICD-10-CM | POA: Diagnosis not present

## 2019-10-18 DIAGNOSIS — F209 Schizophrenia, unspecified: Secondary | ICD-10-CM | POA: Diagnosis not present

## 2019-10-19 DIAGNOSIS — I69922 Dysarthria following unspecified cerebrovascular disease: Secondary | ICD-10-CM | POA: Diagnosis not present

## 2019-10-19 DIAGNOSIS — M4802 Spinal stenosis, cervical region: Secondary | ICD-10-CM | POA: Diagnosis not present

## 2019-10-19 DIAGNOSIS — G20 Parkinson's disease: Secondary | ICD-10-CM | POA: Diagnosis not present

## 2019-10-19 DIAGNOSIS — F209 Schizophrenia, unspecified: Secondary | ICD-10-CM | POA: Diagnosis not present

## 2019-10-19 DIAGNOSIS — I10 Essential (primary) hypertension: Secondary | ICD-10-CM | POA: Diagnosis not present

## 2019-10-19 DIAGNOSIS — M6281 Muscle weakness (generalized): Secondary | ICD-10-CM | POA: Diagnosis not present

## 2019-10-22 DIAGNOSIS — G20 Parkinson's disease: Secondary | ICD-10-CM | POA: Diagnosis not present

## 2019-10-22 DIAGNOSIS — I10 Essential (primary) hypertension: Secondary | ICD-10-CM | POA: Diagnosis not present

## 2019-10-22 DIAGNOSIS — M4802 Spinal stenosis, cervical region: Secondary | ICD-10-CM | POA: Diagnosis not present

## 2019-10-22 DIAGNOSIS — F209 Schizophrenia, unspecified: Secondary | ICD-10-CM | POA: Diagnosis not present

## 2019-10-22 DIAGNOSIS — M6281 Muscle weakness (generalized): Secondary | ICD-10-CM | POA: Diagnosis not present

## 2019-10-22 DIAGNOSIS — I69922 Dysarthria following unspecified cerebrovascular disease: Secondary | ICD-10-CM | POA: Diagnosis not present

## 2019-10-23 DIAGNOSIS — I10 Essential (primary) hypertension: Secondary | ICD-10-CM | POA: Diagnosis not present

## 2019-10-23 DIAGNOSIS — I69922 Dysarthria following unspecified cerebrovascular disease: Secondary | ICD-10-CM | POA: Diagnosis not present

## 2019-10-23 DIAGNOSIS — F209 Schizophrenia, unspecified: Secondary | ICD-10-CM | POA: Diagnosis not present

## 2019-10-23 DIAGNOSIS — G20 Parkinson's disease: Secondary | ICD-10-CM | POA: Diagnosis not present

## 2019-10-23 DIAGNOSIS — M4802 Spinal stenosis, cervical region: Secondary | ICD-10-CM | POA: Diagnosis not present

## 2019-10-23 DIAGNOSIS — M6281 Muscle weakness (generalized): Secondary | ICD-10-CM | POA: Diagnosis not present

## 2019-10-24 ENCOUNTER — Ambulatory Visit (INDEPENDENT_AMBULATORY_CARE_PROVIDER_SITE_OTHER): Payer: Medicare Other | Admitting: Urology

## 2019-10-24 ENCOUNTER — Encounter: Payer: Self-pay | Admitting: Urology

## 2019-10-24 ENCOUNTER — Other Ambulatory Visit: Payer: Self-pay

## 2019-10-24 VITALS — BP 101/68 | HR 91 | Temp 98.1°F | Ht 73.0 in | Wt 160.0 lb

## 2019-10-24 DIAGNOSIS — N138 Other obstructive and reflux uropathy: Secondary | ICD-10-CM

## 2019-10-24 DIAGNOSIS — N3001 Acute cystitis with hematuria: Secondary | ICD-10-CM | POA: Diagnosis not present

## 2019-10-24 DIAGNOSIS — R972 Elevated prostate specific antigen [PSA]: Secondary | ICD-10-CM | POA: Diagnosis not present

## 2019-10-24 DIAGNOSIS — N401 Enlarged prostate with lower urinary tract symptoms: Secondary | ICD-10-CM | POA: Diagnosis not present

## 2019-10-24 DIAGNOSIS — R339 Retention of urine, unspecified: Secondary | ICD-10-CM

## 2019-10-24 LAB — URINALYSIS, ROUTINE W REFLEX MICROSCOPIC
Bilirubin, UA: NEGATIVE
Glucose, UA: NEGATIVE
Ketones, UA: NEGATIVE
Nitrite, UA: POSITIVE — AB
Protein,UA: NEGATIVE
Specific Gravity, UA: 1.02 (ref 1.005–1.030)
Urobilinogen, Ur: 1 mg/dL (ref 0.2–1.0)
pH, UA: 7.5 (ref 5.0–7.5)

## 2019-10-24 LAB — MICROSCOPIC EXAMINATION
Epithelial Cells (non renal): NONE SEEN /hpf (ref 0–10)
Renal Epithel, UA: NONE SEEN /hpf
WBC, UA: >30 /hpf — AB (ref 0–5)

## 2019-10-24 LAB — BLADDER SCAN AMB NON-IMAGING: Scan Result: 119.6

## 2019-10-24 MED ORDER — CEFUROXIME AXETIL 500 MG PO TABS: 500.0000 mg | ORAL_TABLET | Freq: Two times a day (BID) | ORAL | 0 refills | Status: DC

## 2019-10-24 NOTE — Progress Notes (Signed)
10/24/2019 10:39 AM   Mark Benjamin Oct 03, 1960 [id number]  Referring provider: Claretta Fraise, MD [place],  [place] [address]  BPh followup   HPI: Mark Benjamin is a 59yo here for followup for BPH and incomplete emptying. He underwent Urolift in 2019. PVR 119cc. He has nocturia 4x. Stream strong. NO urinary frequency or urgency. Rare urge incontinence. UA is concerning for infection. He has had dysuria for 1 year.   His records from [facility] are as follows: I have an enlarged prostate (follow-up).  HPI: Mark Benjamin is a 59 year-old male established patient who is here for an enlarged prostate follow-up evaluation.  He is currently taking none. He is not on new medications for symptoms of prostate enlargement.   He does not have an abnormal sensation when needing to urinate. He is not having problems getting his urine stream started. He does have a good size and strength to his urinary stream. He is having problems with emptying his bladder well. He does not dribble at the end of urination.   07/29/2017: PVR today is 306cc. He noted no improvement in his LUTS on flomax.   09/14/2017: The patient underwent UDS which showed a 300cc bladder capacity. He was able to generate a strong detrusor contraction but was unable to empty his bladder fully.   11/09/2017: nocturia resolved, frequency improved to 3-5 hours. steam strong. urgnecy resolved. he is s/p urolift   01/11/2018: He notes improvement in his stream, decreased urinary urgency since last visit. When he drinks sodas his urgency and frequency was worse. He has cut down to 1-2 per day from 5-6x per day. He stopped drinking sweet tea   10/24/2018: He has stable LUTS. His LUTS worsen with caffeine and sodas/milk. nocturia stable at 2-3x.     CC: I feel that I am not completely emptying my bladder when I urinate.  HPI: He first stated noticing pain on approximately 07/21/2015. He does have to strain or bear down to start his urinary stream.  He does not dribble at the end of urination. He does not have a history of urethral strictures. He has not been treated with medications in the past for his lower urinary tract symptoms. He is not having problems with urinary control or incontinence. He has not previously had an indwelling catheter in for more than two weeks at a time.   07/29/2017: He notes no improvement in his emptying on flomax 0.4mg  daily   09/14/2017: UDS showed incomplete bladder emptying due to bladder outlet obstruction   11/09/2017: PVR is 58cc. nocturia resolved.   01/11/2018: He has had improvement in his LUTS since last visit. PVR is 151cc today. his urgency has improved significantly since last visit   10/24/2018: PVR 80cc. His LUTS are stable. He has urinary frequency and urgency which is improved with drinking water versus caffeine/milk.       PMH: Past Medical History:  Diagnosis Date  . Cataract   . Colon polyps   . Dysphagia   . Essential hypertension, benign   . Fatty liver   . Hyperplasia of prostate   . 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 Other and unspecified hyperlipidemia   . Parkinson disease ([place])   . Schizophrenia [facility])     Surgical History: Past Surgical History:  Procedure Laterality Date  . COLONOSCOPY  02/13/08  . COLONOSCOPY N/A 07/10/2012   Procedure: COLONOSCOPY;  Surgeon: Rogene Houston, MD;  [facility]: [facility]  END[facility];  Service: Endoscopy;  Laterality: N/A;  225  . CYSTOSCOPY WITH INSERTION OF UROLIFT N/A 10/24/2017   Procedure: CYSTOSCOPY WITH INSERTION OF UROLIFT;  Surgeon: Cleon Gustin, MD;  Location: [facility] ORS;  Service: Urology;  Laterality: N/A;  . UPPER GASTROINTESTINAL ENDOSCOPY  EGD ED   09/09/2010  . URETHRAL DILATION  1965    Home Medications:  Allergies as of 10/24/2019      Reactions   Amoxicillin    Dizzy and blurred vision   Erythromycin    Sulfamethoxazole-trimethoprim Other (See Comments)   Extreme weakness/lethorgy   Zocor  [simvastatin]       Medication List       Accurate as of October 24, 2019 10:39 AM. If you have any questions, ask your nurse or doctor.        acetaminophen 500 MG tablet Commonly known as: TYLENOL Take 1,000 mg by mouth every 6 (six) hours as needed (for fever.).   AZO Cranberry Gummies 500 MG Chew Generic drug: Cranberry Chew 500 mg by mouth daily.   benztropine 1 MG tablet Commonly known as: COGENTIN Take 1 mg by mouth 2 (two) times daily.   carbidopa-levodopa 25-100 MG tablet Commonly known as: SINEMET IR Take 1 tab at 8am, 1 tab at 2pm, 1.5 tabs at bedtime. What changed:   how much to take  how to take this  when to take this  additional instructions   cholecalciferol 25 MCG (1000 UNIT) tablet Commonly known as: VITAMIN D Take 3,000 Units by mouth daily.   divalproex 250 MG DR tablet Commonly known as: DEPAKOTE Take 250 mg by mouth 2 (two) times daily.   docusate sodium 100 MG capsule Commonly known as: COLACE Take 100 mg by mouth daily.   fosinopril-hydrochlorothiazide 10-12.5 MG tablet Commonly known as: MONOPRIL-HCT Take 1 tablet by mouth daily.   loperamide 2 MG capsule Commonly known as: IMODIUM Take 2 mg by mouth See admin instructions. Give 1 capsule (2 mg) by mouth with each loose stool up to 8 doses in 24 hr   Lubricant Eye Drops 0.4-0.3 % Soln Generic drug: Polyethyl Glycol-Propyl Glycol Place 1 drop into both eyes 4 (four) times daily as needed (for dry/irritated eyes (wait 3-5 min between different eye drops)).   magnesium hydroxide 400 MG/5ML suspension Commonly known as: MILK OF MAGNESIA Take 30 mLs by mouth daily as needed (for constipation.).   multivitamin with minerals Tabs tablet Take 1 tablet by mouth daily.   PreviDent 5000 Booster 1.1 % Pste Generic drug: Sodium Fluoride Place 1 application onto teeth at bedtime.   Probiotic Caps Take 1 capsule by mouth 2 (two) times daily. What changed: when to take this     risperiDONE 1 MG tablet Commonly known as: RISPERDAL Take 1 mg by mouth daily.   risperiDONE 2 MG tablet Commonly known as: RISPERDAL Take 2 mg by mouth at bedtime.   rosuvastatin 5 MG tablet Commonly known as: Crestor Take 0.5 tablets (2.5 mg total) by mouth every other day. Take 1/2 tablet on Monday, Wednesday and Friday       Allergies:  Allergies  Allergen Reactions  . Amoxicillin     Dizzy and blurred vision  . Erythromycin   . Sulfamethoxazole-Trimethoprim Other (See Comments)    Extreme weakness/lethorgy  . Zocor [Simvastatin]     Family History: Family History  Problem Relation Age of Onset  . Heart failure Mother   . ALS Father   . Hyperlipidemia Sister   .  Inflammatory bowel disease Paternal Grandfather     Social History:  reports that he has never smoked. He has never used smokeless tobacco. He reports that he does not drink alcohol and does not use drugs.  ROS: All other review of systems were reviewed and are negative except what is noted above in HPI  Physical Exam: BP 101/68   Pulse 91   Temp 98.1 F (36.7 C)   Ht 6\' 1"  (1.854 m)   Wt 160 lb (72.6 kg)   BMI 21.11 kg/m   Constitutional:  Alert and oriented, No acute distress. HEENT: [place] AT, moist mucus membranes.  Trachea midline, no masses. Cardiovascular: No clubbing, cyanosis, or edema. Respiratory: Normal respiratory effort, no increased work of breathing. GI: Abdomen is soft, nontender, nondistended, no abdominal masses GU: No CVA tenderness. Circumcised phallus. No masses/lesions on penis, testis, scrotum. Prostate 40g smooth no nodules no induration.  Lymph: No cervical or inguinal lymphadenopathy. Skin: No rashes, bruises or suspicious lesions. Neurologic: Grossly intact, no focal deficits, moving all 4 extremities. Psychiatric: Normal mood and affect.  Laboratory Data: Lab Results  Component Value Date   WBC 5.1 11/28/2017   HGB 13.5 11/28/2017   HCT 40.6 11/28/2017   MCV 89  11/28/2017   PLT 228 11/28/2017    Lab Results  Component Value Date   CREATININE 0.93 11/28/2017    Lab Results  Component Value Date   PSA 0.80 04/24/2013   PSA 0.7 10/30/2012   PSA 0.73 06/22/2012    No results found for: TESTOSTERONE  Lab Results  Component Value Date   HGBA1C 4.8 11/08/2013    Urinalysis    Component Value Date/Time   APPEARANCEUR Clear 11/28/2017 1058   GLUCOSEU Negative 11/28/2017 1058   BILIRUBINUR Negative 11/28/2017 1058   PROTEINUR Negative 11/28/2017 1058   UROBILINOGEN negative 03/19/2015 1138   NITRITE Negative 11/28/2017 1058   LEUKOCYTESUR Negative 11/28/2017 1058    Lab Results  Component Value Date   LABMICR See below: 11/28/2017   WBCUA 0-5 11/28/2017   RBCUA None seen 11/28/2017   LABEPIT None seen 11/28/2017   MUCUS occ 03/19/2015   BACTERIA None seen 11/28/2017    Pertinent Imaging:  No results found for this or any previous visit.  No results found for this or any previous visit.  No results found for this or any previous visit.  No results found for this or any previous visit.  No results found for this or any previous visit.  No results found for this or any previous visit.  No results found for this or any previous visit.  No results found for this or any previous visit.   Assessment & Plan:    1. Incomplete emptying of bladder -double voiding - Urinalysis, Routine w reflex microscopic - BLADDER SCAN AMB NON-IMAGING  2. Benign prostatic hyperplasia with urinary obstruction -double voiding  3. Acute cystitis -Urine for culture -Ceftin 500mg  BID for 14 days   No follow-ups on file.  Nicolette Bang, MD  [facility] [place]

## 2019-10-24 NOTE — Progress Notes (Signed)
Urological Symptom Review  Patient is experiencing the following symptoms: Burning/pain with urination Get up at night to urinate   Review of Systems  Gastrointestinal (upper)  : Negative for upper GI symptoms  Gastrointestinal (lower) : Negative for lower GI symptoms  Constitutional : Fatigue  Skin: Negative for skin symptoms  Eyes: Negative for eye symptoms  Ear/Nose/Throat : Negative for Ear/Nose/Throat symptoms  Hematologic/Lymphatic: Negative for Hematologic/Lymphatic symptoms  Cardiovascular : Negative for cardiovascular symptoms  Respiratory : Negative for respiratory symptoms  Endocrine: Negative for endocrine symptoms  Musculoskeletal: Negative for musculoskeletal symptoms  Neurological: Negative for neurological symptoms  Psychologic: Negative for psychiatric symptoms

## 2019-10-24 NOTE — Patient Instructions (Signed)

## 2019-10-26 DIAGNOSIS — I69922 Dysarthria following unspecified cerebrovascular disease: Secondary | ICD-10-CM | POA: Diagnosis not present

## 2019-10-26 DIAGNOSIS — M6281 Muscle weakness (generalized): Secondary | ICD-10-CM | POA: Diagnosis not present

## 2019-10-26 DIAGNOSIS — M4802 Spinal stenosis, cervical region: Secondary | ICD-10-CM | POA: Diagnosis not present

## 2019-10-26 DIAGNOSIS — F209 Schizophrenia, unspecified: Secondary | ICD-10-CM | POA: Diagnosis not present

## 2019-10-26 DIAGNOSIS — G20 Parkinson's disease: Secondary | ICD-10-CM | POA: Diagnosis not present

## 2019-10-26 DIAGNOSIS — I10 Essential (primary) hypertension: Secondary | ICD-10-CM | POA: Diagnosis not present

## 2019-10-27 LAB — URINE CULTURE

## 2019-10-30 NOTE — Progress Notes (Signed)
Results mailed. Unable to reach by phone

## 2019-11-02 ENCOUNTER — Ambulatory Visit: Payer: Medicare Other

## 2019-11-06 DIAGNOSIS — I69922 Dysarthria following unspecified cerebrovascular disease: Secondary | ICD-10-CM | POA: Diagnosis not present

## 2019-11-06 DIAGNOSIS — I10 Essential (primary) hypertension: Secondary | ICD-10-CM | POA: Diagnosis not present

## 2019-11-06 DIAGNOSIS — F209 Schizophrenia, unspecified: Secondary | ICD-10-CM | POA: Diagnosis not present

## 2019-11-06 DIAGNOSIS — G20 Parkinson's disease: Secondary | ICD-10-CM | POA: Diagnosis not present

## 2019-11-06 DIAGNOSIS — M6281 Muscle weakness (generalized): Secondary | ICD-10-CM | POA: Diagnosis not present

## 2019-11-06 DIAGNOSIS — M4802 Spinal stenosis, cervical region: Secondary | ICD-10-CM | POA: Diagnosis not present

## 2019-11-07 ENCOUNTER — Ambulatory Visit (INDEPENDENT_AMBULATORY_CARE_PROVIDER_SITE_OTHER): Payer: Medicare Other

## 2019-11-07 ENCOUNTER — Other Ambulatory Visit: Payer: Self-pay

## 2019-11-07 DIAGNOSIS — I69922 Dysarthria following unspecified cerebrovascular disease: Secondary | ICD-10-CM | POA: Diagnosis not present

## 2019-11-07 DIAGNOSIS — E559 Vitamin D deficiency, unspecified: Secondary | ICD-10-CM

## 2019-11-07 DIAGNOSIS — M6281 Muscle weakness (generalized): Secondary | ICD-10-CM | POA: Diagnosis not present

## 2019-11-07 DIAGNOSIS — I10 Essential (primary) hypertension: Secondary | ICD-10-CM | POA: Diagnosis not present

## 2019-11-07 DIAGNOSIS — F209 Schizophrenia, unspecified: Secondary | ICD-10-CM

## 2019-11-07 DIAGNOSIS — G20 Parkinson's disease: Secondary | ICD-10-CM

## 2019-11-07 DIAGNOSIS — M4802 Spinal stenosis, cervical region: Secondary | ICD-10-CM

## 2019-11-08 DIAGNOSIS — G20 Parkinson's disease: Secondary | ICD-10-CM | POA: Diagnosis not present

## 2019-11-08 DIAGNOSIS — I69922 Dysarthria following unspecified cerebrovascular disease: Secondary | ICD-10-CM | POA: Diagnosis not present

## 2019-11-08 DIAGNOSIS — F209 Schizophrenia, unspecified: Secondary | ICD-10-CM | POA: Diagnosis not present

## 2019-11-08 DIAGNOSIS — I10 Essential (primary) hypertension: Secondary | ICD-10-CM | POA: Diagnosis not present

## 2019-11-08 DIAGNOSIS — M4802 Spinal stenosis, cervical region: Secondary | ICD-10-CM | POA: Diagnosis not present

## 2019-11-08 DIAGNOSIS — M6281 Muscle weakness (generalized): Secondary | ICD-10-CM | POA: Diagnosis not present

## 2019-11-12 DIAGNOSIS — E119 Type 2 diabetes mellitus without complications: Secondary | ICD-10-CM | POA: Diagnosis not present

## 2019-11-12 DIAGNOSIS — H31001 Unspecified chorioretinal scars, right eye: Secondary | ICD-10-CM | POA: Diagnosis not present

## 2019-11-12 DIAGNOSIS — H2513 Age-related nuclear cataract, bilateral: Secondary | ICD-10-CM | POA: Diagnosis not present

## 2019-11-12 DIAGNOSIS — H524 Presbyopia: Secondary | ICD-10-CM | POA: Diagnosis not present

## 2019-11-12 DIAGNOSIS — H5213 Myopia, bilateral: Secondary | ICD-10-CM | POA: Diagnosis not present

## 2019-11-12 DIAGNOSIS — H52223 Regular astigmatism, bilateral: Secondary | ICD-10-CM | POA: Diagnosis not present

## 2019-11-13 DIAGNOSIS — I10 Essential (primary) hypertension: Secondary | ICD-10-CM | POA: Diagnosis not present

## 2019-11-13 DIAGNOSIS — M6281 Muscle weakness (generalized): Secondary | ICD-10-CM | POA: Diagnosis not present

## 2019-11-13 DIAGNOSIS — F209 Schizophrenia, unspecified: Secondary | ICD-10-CM | POA: Diagnosis not present

## 2019-11-13 DIAGNOSIS — G20 Parkinson's disease: Secondary | ICD-10-CM | POA: Diagnosis not present

## 2019-11-13 DIAGNOSIS — M4802 Spinal stenosis, cervical region: Secondary | ICD-10-CM | POA: Diagnosis not present

## 2019-11-13 DIAGNOSIS — I69922 Dysarthria following unspecified cerebrovascular disease: Secondary | ICD-10-CM | POA: Diagnosis not present

## 2019-11-16 DIAGNOSIS — I69922 Dysarthria following unspecified cerebrovascular disease: Secondary | ICD-10-CM | POA: Diagnosis not present

## 2019-11-16 DIAGNOSIS — I10 Essential (primary) hypertension: Secondary | ICD-10-CM | POA: Diagnosis not present

## 2019-11-16 DIAGNOSIS — F209 Schizophrenia, unspecified: Secondary | ICD-10-CM | POA: Diagnosis not present

## 2019-11-16 DIAGNOSIS — E559 Vitamin D deficiency, unspecified: Secondary | ICD-10-CM | POA: Diagnosis not present

## 2019-11-16 DIAGNOSIS — M4802 Spinal stenosis, cervical region: Secondary | ICD-10-CM | POA: Diagnosis not present

## 2019-11-16 DIAGNOSIS — G20 Parkinson's disease: Secondary | ICD-10-CM | POA: Diagnosis not present

## 2019-11-16 DIAGNOSIS — M6281 Muscle weakness (generalized): Secondary | ICD-10-CM | POA: Diagnosis not present

## 2019-11-20 DIAGNOSIS — G20 Parkinson's disease: Secondary | ICD-10-CM | POA: Diagnosis not present

## 2019-11-20 DIAGNOSIS — F209 Schizophrenia, unspecified: Secondary | ICD-10-CM | POA: Diagnosis not present

## 2019-11-20 DIAGNOSIS — M6281 Muscle weakness (generalized): Secondary | ICD-10-CM | POA: Diagnosis not present

## 2019-11-20 DIAGNOSIS — I10 Essential (primary) hypertension: Secondary | ICD-10-CM | POA: Diagnosis not present

## 2019-11-20 DIAGNOSIS — M4802 Spinal stenosis, cervical region: Secondary | ICD-10-CM | POA: Diagnosis not present

## 2019-11-20 DIAGNOSIS — I69922 Dysarthria following unspecified cerebrovascular disease: Secondary | ICD-10-CM | POA: Diagnosis not present

## 2019-11-22 DIAGNOSIS — M4802 Spinal stenosis, cervical region: Secondary | ICD-10-CM | POA: Diagnosis not present

## 2019-11-22 DIAGNOSIS — M6281 Muscle weakness (generalized): Secondary | ICD-10-CM | POA: Diagnosis not present

## 2019-11-22 DIAGNOSIS — I69922 Dysarthria following unspecified cerebrovascular disease: Secondary | ICD-10-CM | POA: Diagnosis not present

## 2019-11-22 DIAGNOSIS — I10 Essential (primary) hypertension: Secondary | ICD-10-CM | POA: Diagnosis not present

## 2019-11-22 DIAGNOSIS — G20 Parkinson's disease: Secondary | ICD-10-CM | POA: Diagnosis not present

## 2019-11-22 DIAGNOSIS — F209 Schizophrenia, unspecified: Secondary | ICD-10-CM | POA: Diagnosis not present

## 2019-11-23 DIAGNOSIS — M6281 Muscle weakness (generalized): Secondary | ICD-10-CM | POA: Diagnosis not present

## 2019-11-23 DIAGNOSIS — I69922 Dysarthria following unspecified cerebrovascular disease: Secondary | ICD-10-CM | POA: Diagnosis not present

## 2019-11-23 DIAGNOSIS — F209 Schizophrenia, unspecified: Secondary | ICD-10-CM | POA: Diagnosis not present

## 2019-11-23 DIAGNOSIS — I10 Essential (primary) hypertension: Secondary | ICD-10-CM | POA: Diagnosis not present

## 2019-11-23 DIAGNOSIS — M4802 Spinal stenosis, cervical region: Secondary | ICD-10-CM | POA: Diagnosis not present

## 2019-11-23 DIAGNOSIS — G20 Parkinson's disease: Secondary | ICD-10-CM | POA: Diagnosis not present

## 2019-11-27 DIAGNOSIS — M4802 Spinal stenosis, cervical region: Secondary | ICD-10-CM | POA: Diagnosis not present

## 2019-11-27 DIAGNOSIS — I69922 Dysarthria following unspecified cerebrovascular disease: Secondary | ICD-10-CM | POA: Diagnosis not present

## 2019-11-27 DIAGNOSIS — M6281 Muscle weakness (generalized): Secondary | ICD-10-CM | POA: Diagnosis not present

## 2019-11-27 DIAGNOSIS — F209 Schizophrenia, unspecified: Secondary | ICD-10-CM | POA: Diagnosis not present

## 2019-11-27 DIAGNOSIS — G20 Parkinson's disease: Secondary | ICD-10-CM | POA: Diagnosis not present

## 2019-11-27 DIAGNOSIS — I10 Essential (primary) hypertension: Secondary | ICD-10-CM | POA: Diagnosis not present

## 2019-11-28 DIAGNOSIS — I69922 Dysarthria following unspecified cerebrovascular disease: Secondary | ICD-10-CM | POA: Diagnosis not present

## 2019-11-28 DIAGNOSIS — F209 Schizophrenia, unspecified: Secondary | ICD-10-CM | POA: Diagnosis not present

## 2019-11-28 DIAGNOSIS — M4802 Spinal stenosis, cervical region: Secondary | ICD-10-CM | POA: Diagnosis not present

## 2019-11-28 DIAGNOSIS — G20 Parkinson's disease: Secondary | ICD-10-CM | POA: Diagnosis not present

## 2019-11-28 DIAGNOSIS — M6281 Muscle weakness (generalized): Secondary | ICD-10-CM | POA: Diagnosis not present

## 2019-11-28 DIAGNOSIS — I10 Essential (primary) hypertension: Secondary | ICD-10-CM | POA: Diagnosis not present

## 2019-12-03 DIAGNOSIS — M4802 Spinal stenosis, cervical region: Secondary | ICD-10-CM | POA: Diagnosis not present

## 2019-12-03 DIAGNOSIS — I10 Essential (primary) hypertension: Secondary | ICD-10-CM | POA: Diagnosis not present

## 2019-12-03 DIAGNOSIS — I69922 Dysarthria following unspecified cerebrovascular disease: Secondary | ICD-10-CM | POA: Diagnosis not present

## 2019-12-03 DIAGNOSIS — G20 Parkinson's disease: Secondary | ICD-10-CM | POA: Diagnosis not present

## 2019-12-03 DIAGNOSIS — M6281 Muscle weakness (generalized): Secondary | ICD-10-CM | POA: Diagnosis not present

## 2019-12-03 DIAGNOSIS — F209 Schizophrenia, unspecified: Secondary | ICD-10-CM | POA: Diagnosis not present

## 2019-12-04 DIAGNOSIS — I10 Essential (primary) hypertension: Secondary | ICD-10-CM | POA: Diagnosis not present

## 2019-12-04 DIAGNOSIS — M6281 Muscle weakness (generalized): Secondary | ICD-10-CM | POA: Diagnosis not present

## 2019-12-04 DIAGNOSIS — F209 Schizophrenia, unspecified: Secondary | ICD-10-CM | POA: Diagnosis not present

## 2019-12-04 DIAGNOSIS — I69922 Dysarthria following unspecified cerebrovascular disease: Secondary | ICD-10-CM | POA: Diagnosis not present

## 2019-12-04 DIAGNOSIS — G20 Parkinson's disease: Secondary | ICD-10-CM | POA: Diagnosis not present

## 2019-12-04 DIAGNOSIS — M4802 Spinal stenosis, cervical region: Secondary | ICD-10-CM | POA: Diagnosis not present

## 2019-12-05 ENCOUNTER — Other Ambulatory Visit: Payer: Self-pay

## 2019-12-05 ENCOUNTER — Encounter: Payer: Self-pay | Admitting: Urology

## 2019-12-05 ENCOUNTER — Ambulatory Visit (INDEPENDENT_AMBULATORY_CARE_PROVIDER_SITE_OTHER): Payer: Medicare Other | Admitting: Urology

## 2019-12-05 VITALS — BP 91/58 | HR 92 | Temp 99.1°F

## 2019-12-05 DIAGNOSIS — N401 Enlarged prostate with lower urinary tract symptoms: Secondary | ICD-10-CM | POA: Diagnosis not present

## 2019-12-05 DIAGNOSIS — N138 Other obstructive and reflux uropathy: Secondary | ICD-10-CM

## 2019-12-05 DIAGNOSIS — R339 Retention of urine, unspecified: Secondary | ICD-10-CM | POA: Insufficient documentation

## 2019-12-05 LAB — MICROSCOPIC EXAMINATION
Bacteria, UA: NONE SEEN
Epithelial Cells (non renal): NONE SEEN /hpf (ref 0–10)
Renal Epithel, UA: NONE SEEN /hpf
WBC, UA: NONE SEEN /hpf (ref 0–5)

## 2019-12-05 LAB — URINALYSIS, ROUTINE W REFLEX MICROSCOPIC
Bilirubin, UA: NEGATIVE
Glucose, UA: NEGATIVE
Ketones, UA: NEGATIVE
Leukocytes,UA: NEGATIVE
Nitrite, UA: NEGATIVE
Protein,UA: NEGATIVE
Specific Gravity, UA: 1.02 (ref 1.005–1.030)
Urobilinogen, Ur: 1 mg/dL (ref 0.2–1.0)
pH, UA: 7 (ref 5.0–7.5)

## 2019-12-05 LAB — BLADDER SCAN AMB NON-IMAGING: Scan Result: 238

## 2019-12-05 MED ORDER — ALFUZOSIN HCL ER 10 MG PO TB24
10.0000 mg | ORAL_TABLET | Freq: Every day | ORAL | 11 refills | Status: DC
Start: 2019-12-05 — End: 2020-03-07

## 2019-12-05 NOTE — Progress Notes (Signed)

## 2019-12-05 NOTE — Progress Notes (Signed)
12/05/2019 3:22 PM   Mark Benjamin [birth year] [id number]  Referring provider: Claretta Fraise, MD [place],  [place] [address]  Incomplete emptying  HPI: Mark Benjamin is a 59yo here for followup for incomplete emptying. Last visit he was treated for a UTI with ceftin. UA is normal. dysuria resolved. PVR 238cc. He has a weaker stream. He has occasional urgency with rare urge incontinence. Nocturia 1-2x.     PMH: Past Medical History:  Diagnosis Date  . Cataract   . Colon polyps   . Dysphagia   . Essential hypertension, benign   . Fatty liver   . Hyperplasia of prostate   . 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 Other and unspecified hyperlipidemia   . Parkinson disease ([place])   . Schizophrenia [facility])     Surgical History: Past Surgical History:  Procedure Laterality Date  . COLONOSCOPY  02/13/08  . COLONOSCOPY N/A 07/10/2012   Procedure: COLONOSCOPY;  Surgeon: Rogene Houston, MD;  [facility];  Service: Endoscopy;  Laterality: N/A;  225  . CYSTOSCOPY WITH INSERTION OF UROLIFT N/A 10/24/2017   Procedure: CYSTOSCOPY WITH INSERTION OF UROLIFT;  Surgeon: Cleon Gustin, MD;  Location: [facility] ORS;  Service: Urology;  Laterality: N/A;  . UPPER GASTROINTESTINAL ENDOSCOPY  EGD ED   09/09/2010  . URETHRAL DILATION  1965    Home Medications:  Allergies as of 12/05/2019      Reactions   Amoxicillin    Dizzy and blurred vision   Erythromycin    Penicillins    Sulfamethoxazole-trimethoprim Other (See Comments)   Extreme weakness/lethorgy   Zocor [simvastatin]       Medication List       Accurate as of December 05, 2019  3:22 PM. If you have any questions, ask your nurse or doctor.        acetaminophen 500 MG tablet Commonly known as: TYLENOL Take 1,000 mg by mouth every 6 (six) hours as needed (for fever.).   AZO Cranberry Gummies 500 MG Chew Generic drug: Cranberry Chew 500 mg by mouth daily.   benztropine 1 MG  tablet Commonly known as: COGENTIN Take 1 mg by mouth 2 (two) times daily.   calcium-vitamin D 500-200 MG-UNIT tablet Commonly known as: OSCAL WITH D Take 1 tablet by mouth.   carbidopa-levodopa 25-100 MG tablet Commonly known as: SINEMET IR Take 1 tab at 8am, 1 tab at 2pm, 1.5 tabs at bedtime. What changed:   how much to take  how to take this  when to take this  additional instructions   cefUROXime 500 MG tablet Commonly known as: CEFTIN Take 1 tablet (500 mg total) by mouth 2 (two) times daily with a meal.   chlorhexidine 0.12 % solution Commonly known as: PERIDEX Use as directed 15 mLs in the mouth or throat 2 (two) times daily.   cholecalciferol 25 MCG (1000 UNIT) tablet Commonly known as: VITAMIN D Take 3,000 Units by mouth daily.   divalproex 250 MG DR tablet Commonly known as: DEPAKOTE Take 250 mg by mouth 2 (two) times daily.   docusate sodium 100 MG capsule Commonly known as: COLACE Take 100 mg by mouth daily.   fosinopril-hydrochlorothiazide 10-12.5 MG tablet Commonly known as: MONOPRIL-HCT Take 1 tablet by mouth daily.   loperamide 2 MG capsule Commonly known as: IMODIUM Take 2 mg by mouth See admin instructions. Give 1 capsule (2 mg) by mouth with each loose stool up to  8 doses in 24 hr   Lubricant Eye Drops 0.4-0.3 % Soln Generic drug: Polyethyl Glycol-Propyl Glycol Place 1 drop into both eyes 4 (four) times daily as needed (for dry/irritated eyes (wait 3-5 min between different eye drops)).   magnesium hydroxide 400 MG/5ML suspension Commonly known as: MILK OF MAGNESIA Take 30 mLs by mouth daily as needed (for constipation.).   multivitamin with minerals Tabs tablet Take 1 tablet by mouth daily.   PreviDent 5000 Booster 1.1 % Pste Generic drug: Sodium Fluoride Place 1 application onto teeth at bedtime.   Probiotic Caps Take 1 capsule by mouth 2 (two) times daily. What changed: when to take this   risperiDONE 1 MG tablet Commonly  known as: RISPERDAL Take 1 mg by mouth daily.   risperiDONE 2 MG tablet Commonly known as: RISPERDAL Take 2 mg by mouth at bedtime.   rosuvastatin 5 MG tablet Commonly known as: Crestor Take 0.5 tablets (2.5 mg total) by mouth every other day. Take 1/2 tablet on Monday, Wednesday and Friday       Allergies:  Allergies  Allergen Reactions  . Amoxicillin     Dizzy and blurred vision  . Erythromycin   . Penicillins   . Sulfamethoxazole-Trimethoprim Other (See Comments)    Extreme weakness/lethorgy  . Zocor [Simvastatin]     Family History: Family History  Problem Relation Age of Onset  . Heart failure Mother   . ALS Father   . Hyperlipidemia Sister   . Inflammatory bowel disease Paternal Grandfather     Social History:  reports that he has never smoked. He has never used smokeless tobacco. He reports that he does not drink alcohol and does not use drugs.  ROS: All other review of systems were reviewed and are negative except what is noted above in HPI  Physical Exam: BP (!) 91/58   Pulse 92   Temp 99.1 F (37.3 C)   Constitutional:  Alert and oriented, No acute distress. HEENT: [place] AT, moist mucus membranes.  Trachea midline, no masses. Cardiovascular: No clubbing, cyanosis, or edema. Respiratory: Normal respiratory effort, no increased work of breathing. GI: Abdomen is soft, nontender, nondistended, no abdominal masses GU: No CVA tenderness.  Lymph: No cervical or inguinal lymphadenopathy. Skin: No rashes, bruises or suspicious lesions. Neurologic: Grossly intact, no focal deficits, moving all 4 extremities. Psychiatric: Normal mood and affect.  Laboratory Data: Lab Results  Component Value Date   WBC 5.1 11/28/2017   HGB 13.5 11/28/2017   HCT 40.6 11/28/2017   MCV 89 11/28/2017   PLT 228 11/28/2017    Lab Results  Component Value Date   CREATININE 0.93 11/28/2017    Lab Results  Component Value Date   PSA 0.80 04/24/2013   PSA 0.7 10/30/2012    PSA 0.73 06/22/2012    No results found for: TESTOSTERONE  Lab Results  Component Value Date   HGBA1C 4.8 11/08/2013    Urinalysis    Component Value Date/Time   APPEARANCEUR Cloudy (A) 10/24/2019 1033   GLUCOSEU Negative 10/24/2019 1033   BILIRUBINUR Negative 10/24/2019 1033   PROTEINUR Negative 10/24/2019 1033   UROBILINOGEN negative 03/19/2015 1138   NITRITE Positive (A) 10/24/2019 1033   LEUKOCYTESUR 3+ (A) 10/24/2019 1033    Lab Results  Component Value Date   LABMICR See below: 10/24/2019   WBCUA >30 (A) 10/24/2019   RBCUA None seen 11/28/2017   LABEPIT None seen 10/24/2019   MUCUS occ 03/19/2015   BACTERIA [place] (A) 10/24/2019  Pertinent Imaging:  No results found for this or any previous visit.  No results found for this or any previous visit.  No results found for this or any previous visit.  No results found for this or any previous visit.  No results found for this or any previous visit.  No results found for this or any previous visit.  No results found for this or any previous visit.  No results found for this or any previous visit.   Assessment & Plan:    1. Incomplete emptying of bladder -we will restart uroxatral 10mg  qhs - Urinalysis, Routine w reflex microscopic - Bladder Scan (Post Void Residual) in office  2. Benign prostatic hyperplasia with urinary obstruction -We will start uroxatral 10mg  qhs -RTC 3 months   Return in about 3 months (around 03/05/2020).  Nicolette Bang, MD  [facility] [place]

## 2019-12-05 NOTE — Patient Instructions (Signed)

## 2019-12-08 DIAGNOSIS — M4802 Spinal stenosis, cervical region: Secondary | ICD-10-CM | POA: Diagnosis not present

## 2019-12-08 DIAGNOSIS — G20 Parkinson's disease: Secondary | ICD-10-CM | POA: Diagnosis not present

## 2019-12-08 DIAGNOSIS — I69922 Dysarthria following unspecified cerebrovascular disease: Secondary | ICD-10-CM | POA: Diagnosis not present

## 2019-12-08 DIAGNOSIS — F209 Schizophrenia, unspecified: Secondary | ICD-10-CM | POA: Diagnosis not present

## 2019-12-08 DIAGNOSIS — M6281 Muscle weakness (generalized): Secondary | ICD-10-CM | POA: Diagnosis not present

## 2019-12-08 DIAGNOSIS — I10 Essential (primary) hypertension: Secondary | ICD-10-CM | POA: Diagnosis not present

## 2019-12-11 DIAGNOSIS — M6281 Muscle weakness (generalized): Secondary | ICD-10-CM | POA: Diagnosis not present

## 2019-12-11 DIAGNOSIS — M4802 Spinal stenosis, cervical region: Secondary | ICD-10-CM | POA: Diagnosis not present

## 2019-12-11 DIAGNOSIS — G20 Parkinson's disease: Secondary | ICD-10-CM | POA: Diagnosis not present

## 2019-12-11 DIAGNOSIS — F209 Schizophrenia, unspecified: Secondary | ICD-10-CM | POA: Diagnosis not present

## 2019-12-11 DIAGNOSIS — I10 Essential (primary) hypertension: Secondary | ICD-10-CM | POA: Diagnosis not present

## 2019-12-11 DIAGNOSIS — I69922 Dysarthria following unspecified cerebrovascular disease: Secondary | ICD-10-CM | POA: Diagnosis not present

## 2019-12-14 DIAGNOSIS — I69922 Dysarthria following unspecified cerebrovascular disease: Secondary | ICD-10-CM | POA: Diagnosis not present

## 2019-12-14 DIAGNOSIS — M6281 Muscle weakness (generalized): Secondary | ICD-10-CM | POA: Diagnosis not present

## 2019-12-14 DIAGNOSIS — I10 Essential (primary) hypertension: Secondary | ICD-10-CM | POA: Diagnosis not present

## 2019-12-14 DIAGNOSIS — G20 Parkinson's disease: Secondary | ICD-10-CM | POA: Diagnosis not present

## 2019-12-14 DIAGNOSIS — F209 Schizophrenia, unspecified: Secondary | ICD-10-CM | POA: Diagnosis not present

## 2019-12-14 DIAGNOSIS — M4802 Spinal stenosis, cervical region: Secondary | ICD-10-CM | POA: Diagnosis not present

## 2019-12-17 DIAGNOSIS — R279 Unspecified lack of coordination: Secondary | ICD-10-CM | POA: Diagnosis not present

## 2019-12-17 DIAGNOSIS — M6281 Muscle weakness (generalized): Secondary | ICD-10-CM | POA: Diagnosis not present

## 2019-12-19 DIAGNOSIS — R279 Unspecified lack of coordination: Secondary | ICD-10-CM | POA: Diagnosis not present

## 2019-12-19 DIAGNOSIS — M6281 Muscle weakness (generalized): Secondary | ICD-10-CM | POA: Diagnosis not present

## 2019-12-21 DIAGNOSIS — M6281 Muscle weakness (generalized): Secondary | ICD-10-CM | POA: Diagnosis not present

## 2019-12-21 DIAGNOSIS — R279 Unspecified lack of coordination: Secondary | ICD-10-CM | POA: Diagnosis not present

## 2019-12-24 DIAGNOSIS — R279 Unspecified lack of coordination: Secondary | ICD-10-CM | POA: Diagnosis not present

## 2019-12-24 DIAGNOSIS — M6281 Muscle weakness (generalized): Secondary | ICD-10-CM | POA: Diagnosis not present

## 2019-12-26 DIAGNOSIS — R279 Unspecified lack of coordination: Secondary | ICD-10-CM | POA: Diagnosis not present

## 2019-12-26 DIAGNOSIS — M6281 Muscle weakness (generalized): Secondary | ICD-10-CM | POA: Diagnosis not present

## 2019-12-31 DIAGNOSIS — R279 Unspecified lack of coordination: Secondary | ICD-10-CM | POA: Diagnosis not present

## 2019-12-31 DIAGNOSIS — M6281 Muscle weakness (generalized): Secondary | ICD-10-CM | POA: Diagnosis not present

## 2020-01-02 DIAGNOSIS — R279 Unspecified lack of coordination: Secondary | ICD-10-CM | POA: Diagnosis not present

## 2020-01-02 DIAGNOSIS — M6281 Muscle weakness (generalized): Secondary | ICD-10-CM | POA: Diagnosis not present

## 2020-01-04 DIAGNOSIS — R279 Unspecified lack of coordination: Secondary | ICD-10-CM | POA: Diagnosis not present

## 2020-01-04 DIAGNOSIS — M6281 Muscle weakness (generalized): Secondary | ICD-10-CM | POA: Diagnosis not present

## 2020-01-07 DIAGNOSIS — R279 Unspecified lack of coordination: Secondary | ICD-10-CM | POA: Diagnosis not present

## 2020-01-07 DIAGNOSIS — M6281 Muscle weakness (generalized): Secondary | ICD-10-CM | POA: Diagnosis not present

## 2020-01-09 DIAGNOSIS — M6281 Muscle weakness (generalized): Secondary | ICD-10-CM | POA: Diagnosis not present

## 2020-01-09 DIAGNOSIS — R279 Unspecified lack of coordination: Secondary | ICD-10-CM | POA: Diagnosis not present

## 2020-01-11 DIAGNOSIS — M79675 Pain in left toe(s): Secondary | ICD-10-CM | POA: Diagnosis not present

## 2020-01-11 DIAGNOSIS — B351 Tinea unguium: Secondary | ICD-10-CM | POA: Diagnosis not present

## 2020-01-11 DIAGNOSIS — G629 Polyneuropathy, unspecified: Secondary | ICD-10-CM | POA: Diagnosis not present

## 2020-02-26 DIAGNOSIS — F29 Unspecified psychosis not due to a substance or known physiological condition: Secondary | ICD-10-CM | POA: Diagnosis not present

## 2020-03-07 ENCOUNTER — Ambulatory Visit (INDEPENDENT_AMBULATORY_CARE_PROVIDER_SITE_OTHER): Payer: Medicare Other | Admitting: Urology

## 2020-03-07 ENCOUNTER — Encounter: Payer: Self-pay | Admitting: Urology

## 2020-03-07 ENCOUNTER — Encounter: Payer: Self-pay | Admitting: Family Medicine

## 2020-03-07 ENCOUNTER — Ambulatory Visit (INDEPENDENT_AMBULATORY_CARE_PROVIDER_SITE_OTHER): Payer: Medicare Other | Admitting: Family Medicine

## 2020-03-07 ENCOUNTER — Other Ambulatory Visit: Payer: Self-pay

## 2020-03-07 VITALS — BP 102/68 | HR 94 | Temp 97.4°F | Ht 73.0 in | Wt 162.1 lb

## 2020-03-07 VITALS — BP 90/60 | HR 80 | Temp 98.7°F | Ht 73.0 in | Wt 160.0 lb

## 2020-03-07 DIAGNOSIS — N138 Other obstructive and reflux uropathy: Secondary | ICD-10-CM

## 2020-03-07 DIAGNOSIS — R339 Retention of urine, unspecified: Secondary | ICD-10-CM

## 2020-03-07 DIAGNOSIS — R3915 Urgency of urination: Secondary | ICD-10-CM

## 2020-03-07 DIAGNOSIS — N401 Enlarged prostate with lower urinary tract symptoms: Secondary | ICD-10-CM | POA: Diagnosis not present

## 2020-03-07 DIAGNOSIS — H6123 Impacted cerumen, bilateral: Secondary | ICD-10-CM | POA: Diagnosis not present

## 2020-03-07 LAB — URINALYSIS, ROUTINE W REFLEX MICROSCOPIC
Bilirubin, UA: NEGATIVE
Glucose, UA: NEGATIVE
Ketones, UA: NEGATIVE
Leukocytes,UA: NEGATIVE
Nitrite, UA: NEGATIVE
Protein,UA: NEGATIVE
RBC, UA: NEGATIVE
Specific Gravity, UA: 1.025 (ref 1.005–1.030)
Urobilinogen, Ur: 1 mg/dL (ref 0.2–1.0)
pH, UA: 6 (ref 5.0–7.5)

## 2020-03-07 LAB — BLADDER SCAN AMB NON-IMAGING: Scan Result: 169

## 2020-03-07 MED ORDER — DEBROX 6.5 % OT SOLN
5.0000 [drp] | Freq: Two times a day (BID) | OTIC | 0 refills | Status: AC
Start: 2020-03-07 — End: 2020-03-11

## 2020-03-07 MED ORDER — ALFUZOSIN HCL ER 10 MG PO TB24: 10.0000 mg | ORAL_TABLET | Freq: Two times a day (BID) | ORAL | 11 refills | Status: DC

## 2020-03-07 NOTE — Progress Notes (Signed)
03/07/2020 12:07 PM   Mark Benjamin 19-Sep-1960 [id number]  Referring provider: Claretta Fraise, MD [place],  [place] [address]  followup BPH  HPI: [name] is a 59yo here for followup for BPH and incomplete emptying. His LUTS have improved with uroxatral 10mg  qhs. PVR 170 today which has improved. Stream stronger, nocturia decreased to 1x. Hesitancy has improved. NO starting/stopping of his stream. Overall he is happy with his urination   PMH: Past Medical History:  Diagnosis Date  . Cataract   . Colon polyps   . Dysphagia   . Essential hypertension, benign   . Fatty liver   . Hyperplasia of prostate   . 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 Other and unspecified hyperlipidemia   . Parkinson disease ([place])   . Schizophrenia [facility])     Surgical History: Past Surgical History:  Procedure Laterality Date  . COLONOSCOPY  02/13/08  . COLONOSCOPY N/A 07/10/2012   Procedure: COLONOSCOPY;  Surgeon: Rogene Houston, MD;  Location: [facility] ENDO SUITE;  Service: Endoscopy;  Laterality: N/A;  225  . CYSTOSCOPY WITH INSERTION OF UROLIFT N/A 10/24/2017   Procedure: CYSTOSCOPY WITH INSERTION OF UROLIFT;  Surgeon: Cleon Gustin, MD;  Location: [facility] ORS;  Service: Urology;  Laterality: N/A;  . UPPER GASTROINTESTINAL ENDOSCOPY  EGD ED   09/09/2010  . URETHRAL DILATION  1965    Home Medications:  Allergies as of 03/07/2020      Reactions   Amoxicillin    Dizzy and blurred vision   Erythromycin    Penicillins    Sulfamethoxazole-trimethoprim Other (See Comments)   Extreme weakness/lethorgy   Zocor [simvastatin]       Medication List       Accurate as of March 07, 2020 12:07 PM. If you have any questions, ask your nurse or doctor.        STOP taking these medications   carbidopa-levodopa 25-100 MG tablet Commonly known as: SINEMET IR Stopped by: Nicolette Bang, MD     TAKE these medications   acetaminophen 500 MG tablet Commonly known  as: TYLENOL Take 1,000 mg by mouth every 6 (six) hours as needed (for fever.).   alfuzosin 10 MG 24 hr tablet Commonly known as: UROXATRAL Take 1 tablet (10 mg total) by mouth daily with breakfast.   benztropine 1 MG tablet Commonly known as: COGENTIN Take 1 mg by mouth 2 (two) times daily.   calcium-vitamin D 500-200 MG-UNIT tablet Commonly known as: OSCAL WITH D Take 1 tablet by mouth.   cefUROXime 500 MG tablet Commonly known as: CEFTIN Take 1 tablet (500 mg total) by mouth 2 (two) times daily with a meal.   chlorhexidine 0.12 % solution Commonly known as: PERIDEX Use as directed 15 mLs in the mouth or throat 2 (two) times daily.   cholecalciferol 25 MCG (1000 UNIT) tablet Commonly known as: VITAMIN D Take 3,000 Units by mouth daily.   Cranberry 500 MG Chew Chew 500 mg by mouth daily.   divalproex 250 MG DR tablet Commonly known as: DEPAKOTE Take 250 mg by mouth 2 (two) times daily.   docusate sodium 100 MG capsule Commonly known as: COLACE Take 100 mg by mouth daily.   fosinopril-hydrochlorothiazide 10-12.5 MG tablet Commonly known as: MONOPRIL-HCT Take 1 tablet by mouth daily.   loperamide 2 MG capsule Commonly known as: IMODIUM Take 2 mg by mouth See admin instructions. Give 1 capsule (2 mg) by mouth with each  loose stool up to 8 doses in 24 hr   magnesium hydroxide 400 MG/5ML suspension Commonly known as: MILK OF MAGNESIA Take 30 mLs by mouth daily as needed (for constipation.).   multivitamin with minerals Tabs tablet Take 1 tablet by mouth daily.   Polyethyl Glycol-Propyl Glycol 0.4-0.3 % Soln Place 1 drop into both eyes 4 (four) times daily as needed (for dry/irritated eyes (wait 3-5 min between different eye drops)).   Probiotic Caps Take 1 capsule by mouth 2 (two) times daily. What changed: when to take this   risperiDONE 1 MG tablet Commonly known as: RISPERDAL Take 1 mg by mouth daily.   risperiDONE 2 MG tablet Commonly known as:  RISPERDAL Take 2 mg by mouth at bedtime.   rosuvastatin 5 MG tablet Commonly known as: Crestor Take 0.5 tablets (2.5 mg total) by mouth every other day. Take 1/2 tablet on Monday, Wednesday and Friday   Sodium Fluoride 1.1 % Pste Place 1 application onto teeth at bedtime.       Allergies:  Allergies  Allergen Reactions  . Amoxicillin     Dizzy and blurred vision  . Erythromycin   . Penicillins   . Sulfamethoxazole-Trimethoprim Other (See Comments)    Extreme weakness/lethorgy  . Zocor [Simvastatin]     Family History: Family History  Problem Relation Age of Onset  . Heart failure Mother   . ALS Father   . Hyperlipidemia Sister   . Inflammatory bowel disease Paternal Grandfather     Social History:  reports that he has never smoked. He has never used smokeless tobacco. He reports that he does not drink alcohol and does not use drugs.  ROS: All other review of systems were reviewed and are negative except what is noted above in HPI  Physical Exam: BP 90/60   Pulse 80   Temp 98.7 F (37.1 C)   Ht 6\' 1"  (1.854 m)   Wt 160 lb (72.6 kg)   BMI 21.11 kg/m   Constitutional:  Alert and oriented, No acute distress. HEENT: [place] AT, moist mucus membranes.  Trachea midline, no masses. Cardiovascular: No clubbing, cyanosis, or edema. Respiratory: Normal respiratory effort, no increased work of breathing. GI: Abdomen is soft, nontender, nondistended, no abdominal masses GU: No CVA tenderness.  Lymph: No cervical or inguinal lymphadenopathy. Skin: No rashes, bruises or suspicious lesions. Neurologic: Grossly intact, no focal deficits, moving all 4 extremities. Psychiatric: Normal mood and affect.  Laboratory Data: Lab Results  Component Value Date   WBC 5.1 11/28/2017   HGB 13.5 11/28/2017   HCT 40.6 11/28/2017   MCV 89 11/28/2017   PLT 228 11/28/2017    Lab Results  Component Value Date   CREATININE 0.93 11/28/2017    Lab Results  Component Value Date   PSA  0.80 04/24/2013   PSA 0.7 10/30/2012   PSA 0.73 06/22/2012    No results found for: TESTOSTERONE  Lab Results  Component Value Date   HGBA1C 4.8 11/08/2013    Urinalysis    Component Value Date/Time   APPEARANCEUR Clear 12/05/2019 1536   GLUCOSEU Negative 12/05/2019 1536   BILIRUBINUR Negative 12/05/2019 1536   PROTEINUR Negative 12/05/2019 1536   UROBILINOGEN negative 03/19/2015 1138   NITRITE Negative 12/05/2019 1536   LEUKOCYTESUR Negative 12/05/2019 1536    Lab Results  Component Value Date   LABMICR See below: 12/05/2019   WBCUA None seen 12/05/2019   RBCUA None seen 11/28/2017   LABEPIT None seen 12/05/2019   MUCUS occ  03/19/2015   BACTERIA None seen 12/05/2019    Pertinent Imaging:  No results found for this or any previous visit.  No results found for this or any previous visit.  No results found for this or any previous visit.  No results found for this or any previous visit.  No results found for this or any previous visit.  No results found for this or any previous visit.  No results found for this or any previous visit.  No results found for this or any previous visit.   Assessment & Plan:    1. Benign prostatic hyperplasia with urinary obstruction -increase uroxatral 10mg  to BID - Urinalysis, Routine w reflex microscopic - Bladder Scan (Post Void Residual) in office  2. Incomplete emptying -Increase uroxatral to BID  3. Urinary urgency -timed voiding   No follow-ups on file.  Nicolette Bang, MD  [facility] [place]

## 2020-03-07 NOTE — Progress Notes (Signed)
Subjective: CC: ear wax PCP: Claretta Fraise, MD  SKA:[id number] [name] is a 59 y.o. male presenting to clinic today for:  1. Ear wax buildup  [place] reports ear wax build up in both of his ears for the last few months. He has noticed decreased hearing and ringing in both his ears. He has had issues with ear wax buildup in the past. He has a history of Parkinsons as well. He has not tried any remedies. He denies ear pain, drainage, fever, or chills.   Relevant past medical, surgical, family, and social history reviewed and updated as indicated.  Allergies and medications reviewed and updated.  Allergies  Allergen Reactions  . Amoxicillin     Dizzy and blurred vision  . Erythromycin   . Penicillins   . Sulfamethoxazole-Trimethoprim Other (See Comments)    Extreme weakness/lethorgy  . Zocor [Simvastatin]    Past Medical History:  Diagnosis Date  . Cataract   . Colon polyps   . Dysphagia   . Essential hypertension, benign   . Fatty liver   . Hyperplasia of prostate   . 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 Other and unspecified hyperlipidemia   . Parkinson disease ([place])   . Schizophrenia ([place])     Current Outpatient Medications:  .  acetaminophen (TYLENOL) 500 MG tablet, Take 1,000 mg by mouth every 6 (six) hours as needed (for fever.)., Disp: , Rfl:  .  alfuzosin (UROXATRAL) 10 MG 24 hr tablet, Take 1 tablet (10 mg total) by mouth in the morning and at bedtime., Disp: 60 tablet, Rfl: 11 .  benztropine (COGENTIN) 1 MG tablet, Take 1 mg by mouth 2 (two) times daily. , Disp: , Rfl:  .  calcium-vitamin D (OSCAL WITH D) 500-200 MG-UNIT tablet, Take 1 tablet by mouth., Disp: , Rfl:  .  chlorhexidine (PERIDEX) 0.12 % solution, Use as directed 15 mLs in the mouth or throat 2 (two) times daily., Disp: , Rfl:  .  cholecalciferol (VITAMIN D) 1000 units tablet, Take 3,000 Units by mouth daily., Disp: , Rfl:  .  Cranberry 500 MG CHEW, Chew 500 mg by mouth daily.,  Disp: , Rfl:  .  divalproex (DEPAKOTE) 250 MG DR tablet, Take 250 mg by mouth 2 (two) times daily. , Disp: , Rfl:  .  docusate sodium (COLACE) 100 MG capsule, Take 100 mg by mouth daily. , Disp: , Rfl:  .  fosinopril-hydrochlorothiazide (MONOPRIL-HCT) 10-12.5 MG tablet, Take 1 tablet by mouth daily., Disp: , Rfl:  .  loperamide (IMODIUM) 2 MG capsule, Take 2 mg by mouth See admin instructions. Give 1 capsule (2 mg) by mouth with each loose stool up to 8 doses in 24 hr, Disp: , Rfl:  .  magnesium hydroxide (MILK OF MAGNESIA) 400 MG/5ML suspension, Take 30 mLs by mouth daily as needed (for constipation.)., Disp: , Rfl:  .  Multiple Vitamin (MULTIVITAMIN WITH MINERALS) TABS tablet, Take 1 tablet by mouth daily., Disp: , Rfl:  .  Polyethyl Glycol-Propyl Glycol 0.4-0.3 % SOLN, Place 1 drop into both eyes 4 (four) times daily as needed (for dry/irritated eyes (wait 3-5 min between different eye drops)). , Disp: , Rfl:  .  Probiotic CAPS, Take 1 capsule by mouth 2 (two) times daily. (Patient taking differently: Take 1 capsule by mouth daily.), Disp: 60 capsule, Rfl: 5 .  risperiDONE (RISPERDAL) 1 MG tablet, Take 1 mg by mouth daily. , Disp: , Rfl:  .  risperiDONE (RISPERDAL)  2 MG tablet, Take 2 mg by mouth at bedtime., Disp: , Rfl:  .  rosuvastatin (CRESTOR) 5 MG tablet, Take 0.5 tablets (2.5 mg total) by mouth every other day. Take 1/2 tablet on Monday, Wednesday and Friday, Disp: 45 tablet, Rfl: 3 .  Sodium Fluoride 1.1 % PSTE, Place 1 application onto teeth at bedtime., Disp: , Rfl:  Social History   Socioeconomic History  . Marital status: Single    Spouse name: Not on file  . Number of children: 0  . Years of education: 12th  . Highest education level: High school graduate  Occupational History    Employer: NOT EMPLOYED  Tobacco Use  . Smoking status: Never Smoker  . Smokeless tobacco: Never Used  Vaping Use  . Vaping Use: Never used  Substance and Sexual Activity  . Alcohol use: No  .  Drug use: No  . Sexual activity: Not Currently    Birth control/protection: None  Other Topics Concern  . Not on file  Social History Narrative   Patient lives at [facility], which is an assisted living facility. His sister helps with his care. He has never been married and does not have children.    Social Determinants of Health   Financial Resource Strain: Not on file  Food Insecurity: Not on file  Transportation Needs: Not on file  Physical Activity: Not on file  Stress: Not on file  Social Connections: Not on file  Intimate Partner Violence: Not on file   Family History  Problem Relation Age of Onset  . Heart failure Mother   . ALS Father   . Hyperlipidemia Sister   . Inflammatory bowel disease Paternal Grandfather     Review of Systems  Negative unless specially indicated above in HPI.  Objective: Office vital signs reviewed. BP 102/68   Pulse 94   Temp (!) 97.4 F (36.3 C) (Temporal)   Ht 6\' 1"  (1.854 m)   Wt 162 lb 2 oz (73.5 kg)   BMI 21.39 kg/m     Physical Examination:  Physical Exam Constitutional:      General: He is not in acute distress.    Appearance: He is normal weight. He is not ill-appearing, toxic-appearing or diaphoretic.  HENT:     Right Ear: No drainage, swelling or tenderness. There is impacted cerumen.     Left Ear: No drainage, swelling or tenderness. There is impacted cerumen.  Cardiovascular:     Rate and Rhythm: Normal rate and regular rhythm.     Heart sounds: Normal heart sounds. No murmur heard.   Pulmonary:     Effort: Pulmonary effort is normal. No respiratory distress.  Skin:    General: Skin is warm and dry.  Neurological:     Mental Status: He is alert and oriented to person, place, and time. Mental status is at baseline.  Psychiatric:        Mood and Affect: Mood normal.        Behavior: Behavior normal.    Ear Cerumen Removal  Date/Time: 03/07/2020 4:54 PM Performed by: Gwenlyn Perking, FNP Authorized by:  Gwenlyn Perking, FNP   Anesthesia: Local Anesthetic: none Location details: right ear and left ear Patient tolerance: patient tolerated the procedure well with no immediate complications Procedure type: irrigation  Sedation: Patient sedated: no      Results for orders placed or performed in visit on 03/07/20  Urinalysis, Routine w reflex microscopic  Result Value Ref Range   Specific Gravity,  UA 1.025 1.005 - 1.030   pH, UA 6.0 5.0 - 7.5   Color, UA Yellow Yellow   Appearance Ur Clear Clear   Leukocytes,UA Negative Negative   Protein,UA Negative Negative/Trace   Glucose, UA Negative Negative   Ketones, UA Negative Negative   RBC, UA Negative Negative   Bilirubin, UA Negative Negative   Urobilinogen, Ur 1.0 0.2 - 1.0 mg/dL   Nitrite, UA Negative Negative   Microscopic Examination Comment   Bladder Scan (Post Void Residual) in office  Result Value Ref Range   Scan Result 169      Assessment/ Plan: Darell was seen today for hearing problem.  Diagnoses and all orders for this visit:  Bilateral impacted cerumen Irrigation today in office. TMs visualized and normal bilaterally after. Return to office for new or worsening symptoms, or if symptoms persist.  -     carbamide peroxide (DEBROX) 6.5 % OTIC solution; Place 5 drops into both ears 2 (two) times daily for 4 days. -     Ear Lavage  Follow up as needed.   The above assessment and management plan was discussed with the patient. The patient verbalized understanding of and has agreed to the management plan. Patient is aware to call the clinic if symptoms persist or worsen. Patient is aware when to return to the clinic for a follow-up visit. Patient educated on when it is appropriate to go to the emergency department.   Marjorie Smolder, FNP-C [facility] [address] [place], [place] [address] [phone]

## 2020-03-07 NOTE — Progress Notes (Signed)
Bladder Scan Patient can void: 169 ml Performed By: Myrikal Messmer,lpn   Urological Symptom Review  Patient is experiencing the following symptoms: Frequent urination Hard to postpone urination Get up at night to urinate Leakage of urine Weak stream   Review of Systems  Gastrointestinal (upper)  : Negative for upper GI symptoms  Gastrointestinal (lower) : Constipation  Constitutional : Weight loss  Skin: Negative for skin symptoms  Eyes: Negative for eye symptoms  Ear/Nose/Throat : Negative for Ear/Nose/Throat symptoms  Hematologic/Lymphatic: Negative for Hematologic/Lymphatic symptoms  Cardiovascular : Negative for cardiovascular symptoms  Respiratory : Negative for respiratory symptoms  Endocrine: Negative for endocrine symptoms  Musculoskeletal: Negative for musculoskeletal symptoms  Neurological: Negative for neurological symptoms  Psychologic: Depression

## 2020-03-07 NOTE — Patient Instructions (Signed)
Earwax Buildup, Adult The ears produce a substance called earwax that helps keep bacteria out of the ear and protects the skin in the ear canal. Occasionally, earwax can build up in the ear and cause discomfort or hearing loss. What increases the risk? This condition is more likely to develop in people who:  Are male.  Are elderly.  Naturally produce more earwax.  Clean their ears often with cotton swabs.  Use earplugs often.  Use in-ear headphones often.  Wear hearing aids.  Have narrow ear canals.  Have earwax that is overly thick or sticky.  Have eczema.  Are dehydrated.  Have excess hair in the ear canal. What are the signs or symptoms? Symptoms of this condition include:  Reduced or muffled hearing.  A feeling of fullness in the ear or feeling that the ear is plugged.  Fluid coming from the ear.  Ear pain.  Ear itch.  Ringing in the ear.  Coughing.  An obvious piece of earwax that can be seen inside the ear canal. How is this diagnosed? This condition may be diagnosed based on:  Your symptoms.  Your medical history.  An ear exam. During the exam, your health care provider will look into your ear with an instrument called an otoscope. You may have tests, including a hearing test. How is this treated? This condition may be treated by:  Using ear drops to soften the earwax.  Having the earwax removed by a health care provider. The health care provider may: ? Flush the ear with water. ? Use an instrument that has a loop on the end (curette). ? Use a suction device.  Surgery to remove the wax buildup. This may be done in severe cases. Follow these instructions at home:   Take over-the-counter and prescription medicines only as told by your health care provider.  Do not put any objects, including cotton swabs, into your ear. You can clean the opening of your ear canal with a washcloth or facial tissue.  Follow instructions from your health care  provider about cleaning your ears. Do not over-clean your ears.  Drink enough fluid to keep your urine clear or pale yellow. This will help to thin the earwax.  Keep all follow-up visits as told by your health care provider. If earwax builds up in your ears often or if you use hearing aids, consider seeing your health care provider for routine, preventive ear cleanings. Ask your health care provider how often you should schedule your cleanings.  If you have hearing aids, clean them according to instructions from the manufacturer and your health care provider. Contact a health care provider if:  You have ear pain.  You develop a fever.  You have blood, pus, or other fluid coming from your ear.  You have hearing loss.  You have ringing in your ears that does not go away.  Your symptoms do not improve with treatment.  You feel like the room is spinning (vertigo). Summary  Earwax can build up in the ear and cause discomfort or hearing loss.  The most common symptoms of this condition include reduced or muffled hearing and a feeling of fullness in the ear or feeling that the ear is plugged.  This condition may be diagnosed based on your symptoms, your medical history, and an ear exam.  This condition may be treated by using ear drops to soften the earwax or by having the earwax removed by a health care provider.  Do not put any   objects, including cotton swabs, into your ear. You can clean the opening of your ear canal with a washcloth or facial tissue. This information is not intended to replace advice given to you by your health care provider. Make sure you discuss any questions you have with your health care provider. Document Revised: 02/18/2017 Document Reviewed: 05/19/2016 Elsevier Patient Education  2020 [employer]vier Inc[employer]  

## 2020-03-12 DIAGNOSIS — U071 COVID-19: Secondary | ICD-10-CM | POA: Diagnosis not present

## 2020-03-18 NOTE — Patient Instructions (Signed)

## 2020-06-09 ENCOUNTER — Telehealth: Payer: Self-pay | Admitting: Diagnostic Neuroimaging

## 2020-06-09 NOTE — Telephone Encounter (Signed)
Pt's sister, Alison Murray is till on Carb/levo

## 2020-06-09 NOTE — Telephone Encounter (Signed)
[place] who stated her brother is on psychotropic medicine and carb/levo.  Few weeks ago the director of facility where he lives called her; he had an outburst, then  Thurs he had significant verbal outburst. She said patient continues carb/levo 1 tab twice a day, 1.5 tabs at night.  She feels his parkinson's is worsening, wants sooner FU. I advised her that per Epic EMR his carb/levo was discontinued on 03/07/21 by LPN not in this office. She was going to check on this, call back. We rescheduled FU sooner.  Called sister back who stated she confirmed he is still getting carb/levo as prescribed. She stated staff said he wanders up hall at night time. [name] will talk to psychiatrist to see if he wants to increase depakote or prescribe something to help him rest. She will call tomorrow with update. [name] asked that I let MD know,  verbalized understanding, appreciation.

## 2020-06-09 NOTE — Telephone Encounter (Signed)
error 

## 2020-06-09 NOTE — Telephone Encounter (Signed)
Pt.'s sister/POA [name] is asking if there can be a slight medication adjustment. Please advise.

## 2020-06-25 ENCOUNTER — Other Ambulatory Visit: Payer: Self-pay

## 2020-06-25 ENCOUNTER — Ambulatory Visit (INDEPENDENT_AMBULATORY_CARE_PROVIDER_SITE_OTHER): Payer: Medicare Other | Admitting: Diagnostic Neuroimaging

## 2020-06-25 ENCOUNTER — Encounter: Payer: Self-pay | Admitting: Diagnostic Neuroimaging

## 2020-06-25 VITALS — BP 100/61 | HR 89 | Ht 73.0 in | Wt 162.0 lb

## 2020-06-25 DIAGNOSIS — G20 Parkinson's disease: Secondary | ICD-10-CM | POA: Diagnosis not present

## 2020-06-25 MED ORDER — CARBIDOPA-LEVODOPA 25-100 MG PO TABS: ORAL_TABLET | ORAL | 12 refills | Status: AC

## 2020-06-25 NOTE — Progress Notes (Signed)
[facility]  PATIENT: Mark Benjamin DOB: Oct 25, 1960  REFERRING CLINICIAN: Alphonse Benjamin HISTORY FROM: patient and sister REASON FOR VISIT: follow up   HISTORICAL  CHIEF COMPLAINT:  Chief Complaint  Patient presents with  . Parkinson's disease    Rm 7, early FU requested by sister- Mark Benjamin,  lives at [facility] [place] "needs new Rx for carb/levo"     HISTORY OF PRESENT ILLNESS:   UPDATE (06/25/20, VRP): Since last visit, had more delusions, and then risperidone was increased. Continues on carb/levo, and PD slightly worse. No alleviating or aggravating factors. Tolerating meds.    UPDATE (10/01/19, VRP): Since last visit, doing well overall, but some progressive of sxs. More left hand dystonia and pain. Symptoms are progressive. No alleviating or aggravating factors. Tolerating meds. More balance difficulty.    UPDATE (05/08/18, VRP): Since last visit, having more generalized weakness, malaise, and lack of interest in activities. Symptoms are progressive. More left hand stiffness and dystonia. No alleviating or aggravating factors. Tolerating meds.    UPDATE (08/01/17, VRP): Since last visit, doing well, with improve gait and walking. Tremor stable. Tolerating meds. No alleviating or aggravating factors. No delusions / hallucinations.  UPDATE (01/31/17, VRP): Since last visit, doing well. Tolerating carb/levo. No alleviating or aggravating factors.   UPDATE 09/29/16: Since last visit, tremors and coordination have worsened. Balance poor. Had a minor fall 3 weeks ago. Sister is looking at options for PACE vs assisted living. She is concerned about his safety to live alone. He is no longer driving. They were not able to afford [facility] ($14k out of pocket).   UPDATE 06/15/16: Since last visit, tremor, slowness, stiffness continue. Here to look at other options for treatment. Schizophrenia stable. More balance and coordination issues. Patient lives alone. Driving short  distances.   UPDATE 09/22/15 (VRP): Since last visit, tremor and parkinsonism sxs continue. Saw Dr. Olga Millers, but was told that his spine issues are not amenable to surgical treatment.   UPDATE 06/04/14 (VRP): Since last visit, patient has transitioned from thioridazine to risperdal and cogentin and depakote. Left arm symptoms are stable / slightly improved. Patient not interested in surgical treatment of c-spine surg; also, was told by neurosurg that his neck issues do not need surg decompression.  UPDATE 08/10/13 (LL): Patient states that he was evaluated by Dr. Vertell Limber for cervical stenosis and was told that there was nothing seen on Myelogram that needed surgery. His Thioridazine dose is now 100 mg. Symptoms are stable. He was advised by PCP to cut out artificial sweeteners to see if that changed his symptoms; he did so 2 weeks ago. He complains of mild left leg weakness.  UPDATE 02/26/13 (VRP): Since last visit patient had MRI of the brain and cervical spine which shows moderate spinal stenosis at C5-6 level with severe bilateral foraminal stenosis. Also mild spinal stenosis and moderate right foraminal stenosis at C6-7. No cord signal abnormalities. Since last visit thioridazine dosing has been reduced. Overall his symptoms are stable to slightly progressed.  PRIOR HPI (11/21/12, VRP): 60 year old left-handed male with history of diabetes, hypercholesterolemia, schizophrenia, on thioridazine since 1986, here for evaluation of left arm weakness and numbness since March 2014. Patient reports gradual onset, progressive numbness and weakness and slowness of his left arm. He denies any symptoms in his right arm or legs. No facial droop or facial numbness. He has noticed some slurred speech over the past one week. Patient is left-handed and has noticed difficulty with  brushing his teeth with his left hand, handwriting and other fine movements. Patient's psychiatrist reduced thioridazine dose from 300 mg at  bedtime down to 200 mg at bedtime last month, out of consideration of the patient's presenting symptoms as a potential side effect of medication. Since reducing the dose, no change in symptoms.   REVIEW OF SYSTEMS: Full 14 system review of systems performed and negative: as per HPI.   ALLERGIES: Allergies  Allergen Reactions  . Amoxicillin     Dizzy and blurred vision  . Erythromycin   . Penicillins   . Sulfamethoxazole-Trimethoprim Other (See Comments)    Extreme weakness/lethorgy  . Zocor [Simvastatin]     HOME MEDICATIONS: Outpatient Medications Prior to Visit  Medication Sig Dispense Refill  . alfuzosin (UROXATRAL) 10 MG 24 hr tablet Take 1 tablet (10 mg total) by mouth in the morning and at bedtime. 60 tablet 11  . benztropine (COGENTIN) 1 MG tablet Take 1 mg by mouth 2 (two) times daily.     . chlorhexidine (PERIDEX) 0.12 % solution Use as directed 15 mLs in the mouth or throat 2 (two) times daily.    . cholecalciferol (VITAMIN D) 1000 units tablet Take 3,000 Units by mouth daily.    . divalproex (DEPAKOTE) 250 MG DR tablet Take 250 mg by mouth 2 (two) times daily.     Marland Kitchen docusate sodium (COLACE) 100 MG capsule Take 100 mg by mouth daily.     . Multiple Vitamin (MULTIVITAMIN WITH MINERALS) TABS tablet Take 1 tablet by mouth daily.    Vladimir Faster Glycol-Propyl Glycol 0.4-0.3 % SOLN Place 1 drop into both eyes 4 (four) times daily as needed (for dry/irritated eyes (wait 3-5 min between different eye drops)).     . Probiotic CAPS Take 1 capsule by mouth 2 (two) times daily. (Patient taking differently: Take 1 capsule by mouth daily.) 60 capsule 5  . risperiDONE (RISPERDAL) 2 MG tablet Take 2 mg by mouth at bedtime.    . rosuvastatin (CRESTOR) 5 MG tablet Take 0.5 tablets (2.5 mg total) by mouth every other day. Take 1/2 tablet on Monday, Wednesday and Friday 45 tablet 3  . Sodium Fluoride 1.1 % PSTE Place 1 application onto teeth at bedtime.    Marland Kitchen acetaminophen (TYLENOL) 500 MG  tablet Take 1,000 mg by mouth every 6 (six) hours as needed (for fever.). (Patient not taking: Reported on 06/25/2020)    . calcium-vitamin D (OSCAL WITH D) 500-200 MG-UNIT tablet Take 1 tablet by mouth. (Patient not taking: Reported on 06/25/2020)    . Cranberry 500 MG CHEW Chew 500 mg by mouth daily. (Patient not taking: Reported on 06/25/2020)    . fosinopril-hydrochlorothiazide (MONOPRIL-HCT) 10-12.5 MG tablet Take 1 tablet by mouth daily. (Patient not taking: Reported on 06/25/2020)    . loperamide (IMODIUM) 2 MG capsule Take 2 mg by mouth See admin instructions. Give 1 capsule (2 mg) by mouth with each loose stool up to 8 doses in 24 hr (Patient not taking: Reported on 06/25/2020)    . magnesium hydroxide (MILK OF MAGNESIA) 400 MG/5ML suspension Take 30 mLs by mouth daily as needed (for constipation.). (Patient not taking: Reported on 06/25/2020)    . risperiDONE (RISPERDAL) 1 MG tablet Take 1 mg by mouth daily.      No facility-administered medications prior to visit.    PAST MEDICAL HISTORY: Past Medical History:  Diagnosis Date  . Cataract   . Colon polyps   . Dysphagia   . Essential hypertension,  benign   . Fatty liver   . Hyperplasia of prostate   . 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 Other and unspecified hyperlipidemia   . Parkinson disease ([place])   . Schizophrenia ([place])     PAST SURGICAL HISTORY: Past Surgical History:  Procedure Laterality Date  . COLONOSCOPY  02/13/08  . COLONOSCOPY N/A 07/10/2012   Procedure: COLONOSCOPY;  Surgeon: Rogene Houston, MD;  Location: [facility];  Service: Endoscopy;  Laterality: N/A;  225  . CYSTOSCOPY WITH INSERTION OF UROLIFT N/A 10/24/2017   Procedure: CYSTOSCOPY WITH INSERTION OF UROLIFT;  Surgeon: Cleon Gustin, MD;  Location: [facility] ORS;  Service: Urology;  Laterality: N/A;  . UPPER GASTROINTESTINAL ENDOSCOPY  [facility] ED   09/09/2010  . URETHRAL DILATION  1965    FAMILY HISTORY: Family History  Problem Relation  Age of Onset  . Heart failure Mother   . ALS Father   . Hyperlipidemia Sister   . Inflammatory bowel disease Paternal Grandfather     SOCIAL HISTORY:  Social History   Socioeconomic History  . Marital status: Single    Spouse name: Not on file  . Number of children: 0  . Years of education: 12th  . Highest education level: High school graduate  Occupational History    Employer: NOT EMPLOYED  Tobacco Use  . Smoking status: Never Smoker  . Smokeless tobacco: Never Used  Vaping Use  . Vaping Use: Never used  Substance and Sexual Activity  . Alcohol use: No  . Drug use: No  . Sexual activity: Not Currently    Birth control/protection: None  Other Topics Concern  . Not on file  Social History Narrative   Patient lives at [facility], which is an assisted living facility. His sister helps with his care. He has never been married and does not have children.    Social Determinants of Health   Financial Resource Strain: Not on file  Food Insecurity: Not on file  Transportation Needs: Not on file  Physical Activity: Not on file  Stress: Not on file  Social Connections: Not on file  Intimate Partner Violence: Not on file     PHYSICAL EXAM  GENERAL EXAM/CONSTITUTIONAL: Vitals:  Vitals:   06/25/20 1533  BP: 100/61  Pulse: 89  Weight: 162 lb (73.5 kg)  Height: 6\' 1"  (1.854 m)   Body mass index is 21.37 kg/m. Wt Readings from Last 3 Encounters:  06/25/20 162 lb (73.5 kg)  03/07/20 162 lb 2 oz (73.5 kg)  03/07/20 160 lb (72.6 kg)    Patient is in no distress; well developed, nourished and groomed; neck is supple  CARDIOVASCULAR:  Examination of carotid arteries is normal; no carotid bruits  Regular rate and rhythm, no murmurs  Examination of peripheral vascular system by observation and palpation is normal  EYES:  Ophthalmoscopic exam of optic discs and posterior segments is normal; no papilledema or hemorrhages No exam data  present  MUSCULOSKELETAL:  Gait, strength, tone, movements noted in Neurologic exam below  NEUROLOGIC: MENTAL STATUS:  MMSE - [place] Exam 05/04/2017 04/15/2016 04/14/2015  Orientation to time 5 5 5   Orientation to Place 5 5 5   Registration 3 3 3   Attention/ Calculation 5 5 5   Recall 3 3 2   Language- name 2 objects 2 2 2   Language- repeat 1 1 1   Language- follow 3 step command 3 3 3   Language- read & follow direction 1 1 1  Write a sentence 0 1 0  Write a sentence-comments unable to write due to problem with left hand - -  Copy design 0 1 0  Total score 28 30 27     awake, alert, oriented to person, place and time  recent and remote memory intact  normal attention and concentration  language fluent, comprehension intact, naming intact  fund of knowledge appropriate  CRANIAL NERVE:   2nd - no papilledema on fundoscopic exam  2nd, 3rd, 4th, 6th - pupils equal and reactive to light, visual fields full to confrontation, extraocular muscles intact, no nystagmus  5th - facial sensation symmetric  7th - facial strength symmetric  8th - hearing intact  9th - palate elevates symmetrically, uvula midline  11th - shoulder shrug symmetric  12th - tongue protrusion midline  STUTTERING SPEECH  MASKED FACIES  MOTOR:   INCREASED TONE IN BUE AND BLE; DYSTONIA IN LEFT HAND / ARM  MILD RESTING TREMOR IN LUE  BUE 4; BLE 3-4  SENSORY:   normal and symmetric to light touch  COORDINATION:   finger-nose-finger, fine finger movements normal  REFLEXES:   deep tendon reflexes TRACE and symmetric  GAIT/STATION:   ABLE TO STAND; SHORT STEPS WITH WALKER      DIAGNOSTIC DATA (LABS, IMAGING, TESTING) - I reviewed patient records, labs, notes, testing and imaging myself where available.  Lab Results  Component Value Date   WBC 5.1 11/28/2017   HGB 13.5 11/28/2017   HCT 40.6 11/28/2017   MCV 89 11/28/2017   PLT 228 11/28/2017      Component Value  Date/Time   NA 139 11/28/2017 1528   K 3.7 11/28/2017 1528   CL 98 11/28/2017 1528   CO2 26 11/28/2017 1528   GLUCOSE 81 11/28/2017 1528   GLUCOSE 105 (H) 10/17/2017 1022   BUN 7 11/28/2017 1528   CREATININE 0.93 11/28/2017 1528   CREATININE 0.86 04/24/2013 0928   CALCIUM 9.8 11/28/2017 1528   PROT 6.8 01/27/2018 0828   ALBUMIN 4.6 01/27/2018 0828   AST 17 01/27/2018 0828   ALT 12 01/27/2018 0828   ALKPHOS 57 01/27/2018 0828   BILITOT 1.3 (H) 01/27/2018 0828   GFRNONAA 91 11/28/2017 1528   GFRNONAA >89 04/24/2013 0928   GFRAA 105 11/28/2017 1528   GFRAA >89 04/24/2013 0928   Lab Results  Component Value Date   CHOL 154 01/27/2018   HDL 51 01/27/2018   LDLCALC 92 01/27/2018   TRIG 56 01/27/2018   CHOLHDL 3.0 01/27/2018   Lab Results  Component Value Date   HGBA1C 4.8 11/08/2013   Lab Results  Component Value Date   WLNLGXQJ19 417 06/03/2016   Lab Results  Component Value Date   TSH 1.310 06/03/2016    11/29/12 MRI cervical  1. At C5-6: disc bulging with moderate spinal stenosis and severe biforaminal stenosis  2. At C3-4, C4-5, C6-7: disc bulging with mild spinal stenosis and severe biforaminal stenosis  3. No cord signal abnormalities.  11/29/12 MRI brain (without) demonstrating:  1. Single right frontal subcortical focus (52mm) of non-specific gliosis.  2. No acute findings.  04/12/13 CT myelogram cervical spine - Central disc herniation at C3-4 with moderate central stenosis. Severe left foraminal narrowing at this level. Moderate central stenosis at C5-6 and C6-7 secondary to posterior disc osteophytes.  06/05/15 MRI cervical spine [I reviewed images myself and agree with interpretation. -VRP]  1. At C3-4 there is a mild broad-based disc bulge. Left uncovertebral degenerative change and left facet  arthropathy resulting in severe left foraminal stenosis. Mild right foraminal stenosis. Mild spinal stenosis. 2. Cervical spine spondylosis as described  above.     ASSESSMENT AND PLAN  60 y.o. year old male here with gradual onset, progressive left arm weakness, slowness and stiffness with some numbness. On exam, patient has masked facies, rigidity in the left arm, bradykinesia in the left arm, stooped posture and poor arm swing. Ordered [facility]scan to eval for parkinson's disease vs drug-induced parkinsonism --> but not affordable at this time   Dx: parkinsonism (likely idiopathic parkinson's disease; drug induced less likely as symptoms have continued to progress in spite of reducing anti-psychotic medications) + mild cervical spinal stenosis and cervical radiculopathy  Parkinson's disease (HCC)    PLAN:  PARKINSON'S DISEASE (established problem, progressive) - continue carb/levo (1 tab 8am, 1 tab 2pm, 1.5tabs 8pm); cannot tolerate higher dose due to hallucinations - continue physical activity / exercises / PT - continue to use lowest possible dose of anti-psychotic medication (Dr. Letitia Caul; psychiatry)  LEFT HAND DYSTONIA - consider botox if severe pain develops in future - continue occupational therapy; continue wrist / hand splint  SIALORRHEA - consider botox in future  Meds ordered this encounter  Medications  . carbidopa-levodopa (SINEMET IR) 25-100 MG tablet    Sig: 1 tab at 8am, 1 tab at 2pm, 1.5 tabs at 8pm    Dispense:  120 tablet    Refill:  12   Return in about 1 year (around 06/25/2021).    Penni Bombard, MD 6/0/6004, 5:99 PM Certified in Neurology, Neurophysiology and Neuroimaging  [facility] [address], [place], [place] [address] [phone]

## 2020-07-08 ENCOUNTER — Telehealth: Payer: Self-pay | Admitting: Diagnostic Neuroimaging

## 2020-07-08 NOTE — Telephone Encounter (Signed)
[name] has called Stanton Kidney C,RN back she can be reached at [phone]

## 2020-07-08 NOTE — Telephone Encounter (Signed)
[facility] ([name]) called, need verbal orders for physical therapy.   Contact info: [phone]

## 2020-07-08 NOTE — Telephone Encounter (Signed)
Called [name], LVM requesting call back.

## 2020-07-08 NOTE — Telephone Encounter (Signed)
Agree. -VRP 

## 2020-07-08 NOTE — Telephone Encounter (Signed)
Called [name] who stated they need verbal orders for continuing PT thru [facility] for parkinson's disease,  weakness, gait difficulty and help with ADLs'. I gave her verbal orders per Dr Leta Baptist. They will fax order sheet for MD to sign. [name]  verbalized understanding, appreciation.

## 2020-07-23 ENCOUNTER — Telehealth: Payer: Self-pay | Admitting: *Deleted

## 2020-07-23 NOTE — Telephone Encounter (Signed)
Pt. Needs to be seen for this. Thanks, WS 

## 2020-07-23 NOTE — Telephone Encounter (Signed)
Spoke with Kathlee Nations at [facility] and informed Dr. Livia Snellen wanted patient to be seen.  I contacted [facility] and we scheduled patient an appointment on 07/28/20 at 10:25 am with Dr. Livia Snellen.

## 2020-07-23 NOTE — Telephone Encounter (Signed)
Vm from [facility]/ [facility] Yesterdays BPs were 90/54 then 86/52 upon standing, no c/o dizziness yesterday, does say he has dizziness sometimes. Pt is on Lisinopril 10 mg & HCTZ 12.5 mg that Dr. Laurance Flatten put him on a long time ago. Other readings have been 104/62 on 07/17/20 and 112/68 on 4/27 Do you want to DC this medication Please advise & let Kathlee Nations know

## 2020-07-24 ENCOUNTER — Encounter: Payer: Self-pay | Admitting: Family Medicine

## 2020-07-25 ENCOUNTER — Encounter: Payer: Self-pay | Admitting: Family Medicine

## 2020-07-28 ENCOUNTER — Other Ambulatory Visit: Payer: Self-pay

## 2020-07-28 ENCOUNTER — Ambulatory Visit (INDEPENDENT_AMBULATORY_CARE_PROVIDER_SITE_OTHER): Payer: Medicare Other | Admitting: Family Medicine

## 2020-07-28 ENCOUNTER — Encounter: Payer: Self-pay | Admitting: Family Medicine

## 2020-07-28 VITALS — BP 89/57 | HR 82 | Temp 96.9°F | Ht 73.0 in | Wt 161.8 lb

## 2020-07-28 DIAGNOSIS — F209 Schizophrenia, unspecified: Secondary | ICD-10-CM

## 2020-07-28 DIAGNOSIS — I951 Orthostatic hypotension: Secondary | ICD-10-CM | POA: Diagnosis not present

## 2020-07-28 DIAGNOSIS — G20 Parkinson's disease: Secondary | ICD-10-CM

## 2020-07-28 DIAGNOSIS — E7800 Pure hypercholesterolemia, unspecified: Secondary | ICD-10-CM

## 2020-07-28 DIAGNOSIS — I10 Essential (primary) hypertension: Secondary | ICD-10-CM | POA: Diagnosis not present

## 2020-07-28 NOTE — Progress Notes (Signed)
Subjective:  Patient ID: Mark Benjamin, male    DOB: 03-27-1960  Age: 60 y.o. MRN: [id number]  CC: Hypotension   HPI PRIEST LOCKRIDGE presents for  follow-up of hypertension. Patient has no history of headache chest pain or shortness of breath or recent cough. Patient also denies symptoms of TIA such as focal numbness or weakness. Patient reports hypotension with orthostatic changes as side effects from medication. States taking it regularly. Onset 5 days ago. Denies LOC, passing out   History Nkosi has a past medical history of Cataract, Colon polyps, Dysphagia, Essential hypertension, benign, Fatty liver, Hyperplasia of prostate, 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, Other and unspecified hyperlipidemia, Parkinson disease ([place]), and Schizophrenia ([place]).   He has a past surgical history that includes Upper gastrointestinal endoscopy (EGD ED); Colonoscopy (02/13/08); Colonoscopy (N/A, 07/10/2012); Urethral dilation (1965); and Cystoscopy with insertion of urolift (N/A, 10/24/2017).   His family history includes ALS in his father; Heart failure in his mother; Hyperlipidemia in his sister; Inflammatory bowel disease in his paternal grandfather.He reports that he has never smoked. He has never used smokeless tobacco. He reports that he does not drink alcohol and does not use drugs.  Current Outpatient Medications on File Prior to Visit  Medication Sig Dispense Refill  . acetaminophen (TYLENOL) 500 MG tablet Take 1,000 mg by mouth every 6 (six) hours as needed (for fever.).    Marland Kitchen alfuzosin (UROXATRAL) 10 MG 24 hr tablet Take 1 tablet (10 mg total) by mouth in the morning and at bedtime. 60 tablet 11  . benztropine (COGENTIN) 1 MG tablet Take 1 mg by mouth 2 (two) times daily.     . calcium-vitamin D (OSCAL WITH D) 500-200 MG-UNIT tablet Take 1 tablet by mouth.    . carbidopa-levodopa (SINEMET IR) 25-100 MG tablet 1 tab at 8am, 1 tab at 2pm, 1.5 tabs at 8pm 120 tablet 12  .  chlorhexidine (PERIDEX) 0.12 % solution Use as directed 15 mLs in the mouth or throat 2 (two) times daily.    . cholecalciferol (VITAMIN D) 1000 units tablet Take 3,000 Units by mouth daily.    . Cranberry 500 MG CHEW Chew 500 mg by mouth daily.    . divalproex (DEPAKOTE) 250 MG DR tablet Take 250 mg by mouth 2 (two) times daily.     Marland Kitchen docusate sodium (COLACE) 100 MG capsule Take 100 mg by mouth daily.     Marland Kitchen loperamide (IMODIUM) 2 MG capsule Take 2 mg by mouth See admin instructions. Give 1 capsule (2 mg) by mouth with each loose stool up to 8 doses in 24 hr    . magnesium hydroxide (MILK OF MAGNESIA) 400 MG/5ML suspension Take 30 mLs by mouth daily as needed (for constipation.).    Marland Kitchen Multiple Vitamin (MULTIVITAMIN WITH MINERALS) TABS tablet Take 1 tablet by mouth daily.    Vladimir Faster Glycol-Propyl Glycol 0.4-0.3 % SOLN Place 1 drop into both eyes 4 (four) times daily as needed (for dry/irritated eyes (wait 3-5 min between different eye drops)).     . Probiotic CAPS Take 1 capsule by mouth 2 (two) times daily. (Patient taking differently: Take 1 capsule by mouth daily.) 60 capsule 5  . risperiDONE (RISPERDAL) 2 MG tablet Take 2 mg by mouth at bedtime.    . rosuvastatin (CRESTOR) 5 MG tablet Take 0.5 tablets (2.5 mg total) by mouth every other day. Take 1/2 tablet on Monday, Wednesday and Friday 45 tablet 3  . Sodium  Fluoride 1.1 % PSTE Place 1 application onto teeth at bedtime.     No current facility-administered medications on file prior to visit.    ROS Review of Systems  Unable to perform ROS: Psychiatric disorder (limited speech - apraxia, schizophrenia)    Objective:  BP (!) 89/57   Pulse 82   Temp (!) 96.9 F (36.1 C)   Ht '6\' 1"'  (1.854 m)   Wt 161 lb 12.8 oz (73.4 kg)   SpO2 100%   BMI 21.35 kg/m   BP Readings from Last 3 Encounters:  07/28/20 (!) 89/57  06/25/20 100/61  03/07/20 102/68    Wt Readings from Last 3 Encounters:  07/28/20 161 lb 12.8 oz (73.4 kg)   06/25/20 162 lb (73.5 kg)  03/07/20 162 lb 2 oz (73.5 kg)     Physical Exam Vitals reviewed.  Constitutional:      Appearance: He is well-developed.  HENT:     Head: Normocephalic and atraumatic.     Right Ear: Tympanic membrane and external ear normal. No decreased hearing noted.     Left Ear: Tympanic membrane and external ear normal. No decreased hearing noted.     Mouth/Throat:     Pharynx: No oropharyngeal exudate or posterior oropharyngeal erythema.  Eyes:     Pupils: Pupils are equal, round, and reactive to light.  Cardiovascular:     Rate and Rhythm: Normal rate and regular rhythm.     Heart sounds: No murmur heard.   Pulmonary:     Effort: No respiratory distress.     Breath sounds: Normal breath sounds.  Abdominal:     General: Bowel sounds are normal.     Palpations: Abdomen is soft. There is no mass.     Tenderness: There is no abdominal tenderness.  Musculoskeletal:        General: Deformity present.     Cervical back: Normal range of motion and neck supple.     Right lower leg: Right lower leg edema: LUE contractured.  Neurological:     Mental Status: He is alert.     Motor: Weakness (left side, with hemiparesis worse at LUE. Ambulates with wlker, left hand contractured) present.       Assessment & Plan:   Jabre was seen today for hypotension.  Diagnoses and all orders for this visit:  Primary hypertension -     CBC with Differential/Platelet -     CMP14+EGFR  Pure hypercholesterolemia -     CBC with Differential/Platelet -     CMP14+EGFR -     Lipid panel  Orthostatic hypotension -     CBC with Differential/Platelet -     CMP14+EGFR  Primary parkinsonism (HCC)  Schizophrenia, unspecified type ([place])   Allergies as of 07/28/2020      Reactions   Amoxicillin    Dizzy and blurred vision   Erythromycin    Penicillins    Sulfamethoxazole-trimethoprim Other (See Comments)   Extreme weakness/lethorgy   Zocor [simvastatin]        Medication List       Accurate as of Jul 28, 2020 11:21 AM. If you have any questions, ask your nurse or doctor.        STOP taking these medications   fosinopril-hydrochlorothiazide 10-12.5 MG tablet Commonly known as: MONOPRIL-HCT Stopped by: Claretta Fraise, MD     TAKE these medications   acetaminophen 500 MG tablet Commonly known as: TYLENOL Take 1,000 mg by mouth every 6 (six) hours as needed (for  fever.).   alfuzosin 10 MG 24 hr tablet Commonly known as: UROXATRAL Take 1 tablet (10 mg total) by mouth in the morning and at bedtime.   benztropine 1 MG tablet Commonly known as: COGENTIN Take 1 mg by mouth 2 (two) times daily.   calcium-vitamin D 500-200 MG-UNIT tablet Commonly known as: OSCAL WITH D Take 1 tablet by mouth.   carbidopa-levodopa 25-100 MG tablet Commonly known as: SINEMET IR 1 tab at 8am, 1 tab at 2pm, 1.5 tabs at 8pm   chlorhexidine 0.12 % solution Commonly known as: PERIDEX Use as directed 15 mLs in the mouth or throat 2 (two) times daily.   cholecalciferol 25 MCG (1000 UNIT) tablet Commonly known as: VITAMIN D Take 3,000 Units by mouth daily.   Cranberry 500 MG Chew Chew 500 mg by mouth daily.   divalproex 250 MG DR tablet Commonly known as: DEPAKOTE Take 250 mg by mouth 2 (two) times daily.   docusate sodium 100 MG capsule Commonly known as: COLACE Take 100 mg by mouth daily.   loperamide 2 MG capsule Commonly known as: IMODIUM Take 2 mg by mouth See admin instructions. Give 1 capsule (2 mg) by mouth with each loose stool up to 8 doses in 24 hr   magnesium hydroxide 400 MG/5ML suspension Commonly known as: MILK OF MAGNESIA Take 30 mLs by mouth daily as needed (for constipation.).   multivitamin with minerals Tabs tablet Take 1 tablet by mouth daily.   Polyethyl Glycol-Propyl Glycol 0.4-0.3 % Soln Place 1 drop into both eyes 4 (four) times daily as needed (for dry/irritated eyes (wait 3-5 min between different eye drops)).    Probiotic Caps Take 1 capsule by mouth 2 (two) times daily. What changed: when to take this   risperiDONE 2 MG tablet Commonly known as: RISPERDAL Take 2 mg by mouth at bedtime.   rosuvastatin 5 MG tablet Commonly known as: Crestor Take 0.5 tablets (2.5 mg total) by mouth every other day. Take 1/2 tablet on Monday, Wednesday and Friday   Sodium Fluoride 1.1 % Pste Place 1 application onto teeth at bedtime.         Follow-up: Return in about 1 month (around 08/28/2020), or if symptoms worsen or fail to improve.  Claretta Fraise, M.D.

## 2020-07-29 LAB — CMP14+EGFR
ALT: 17 IU/L (ref 0–44)
AST: 21 IU/L (ref 0–40)
Albumin/Globulin Ratio: 2.2 (ref 1.2–2.2)
Albumin: 4.4 g/dL (ref 3.8–4.9)
Alkaline Phosphatase: 56 IU/L (ref 44–121)
BUN/Creatinine Ratio: 7 — ABNORMAL LOW (ref 10–24)
BUN: 6 mg/dL — ABNORMAL LOW (ref 8–27)
Bilirubin Total: 1.3 mg/dL — ABNORMAL HIGH (ref 0.0–1.2)
CO2: 24 mmol/L (ref 20–29)
Calcium: 9.2 mg/dL (ref 8.6–10.2)
Chloride: 96 mmol/L (ref 96–106)
Creatinine, Ser: 0.89 mg/dL (ref 0.76–1.27)
Globulin, Total: 2 g/dL (ref 1.5–4.5)
Glucose: 86 mg/dL (ref 65–99)
Potassium: 3.9 mmol/L (ref 3.5–5.2)
Sodium: 137 mmol/L (ref 134–144)
Total Protein: 6.4 g/dL (ref 6.0–8.5)
eGFR: 98 mL/min/{1.73_m2} (ref >59)

## 2020-07-29 LAB — CBC WITH DIFFERENTIAL/PLATELET
Basophils Absolute: 0.1 10*3/uL (ref 0.0–0.2)
Basos: 1 %
EOS (ABSOLUTE): 0.1 10*3/uL (ref 0.0–0.4)
Eos: 3 %
Hematocrit: 39.3 % (ref 37.5–51.0)
Hemoglobin: 13.3 g/dL (ref 13.0–17.7)
Immature Grans (Abs): 0 10*3/uL (ref 0.0–0.1)
Immature Granulocytes: 0 %
Lymphocytes Absolute: 1.7 10*3/uL (ref 0.7–3.1)
Lymphs: 34 %
MCH: 31.3 pg (ref 26.6–33.0)
MCHC: 33.8 g/dL (ref 31.5–35.7)
MCV: 93 fL (ref 79–97)
Monocytes Absolute: 0.6 10*3/uL (ref 0.1–0.9)
Monocytes: 12 %
Neutrophils Absolute: 2.5 10*3/uL (ref 1.4–7.0)
Neutrophils: 50 %
Platelets: 191 10*3/uL (ref 150–450)
RBC: 4.25 x10E6/uL (ref 4.14–5.80)
RDW: 11.9 % (ref 11.6–15.4)
WBC: 5 10*3/uL (ref 3.4–10.8)

## 2020-07-29 LAB — LIPID PANEL
Chol/HDL Ratio: 2.5 ratio (ref 0.0–5.0)
Cholesterol, Total: 141 mg/dL (ref 100–199)
HDL: 57 mg/dL (ref >39)
LDL Chol Calc (NIH): 72 mg/dL (ref 0–99)
Triglycerides: 59 mg/dL (ref 0–149)
VLDL Cholesterol Cal: 12 mg/dL (ref 5–40)

## 2020-07-30 NOTE — Progress Notes (Signed)
Hello Jowan,  Your lab result is normal and/or stable.Some minor variations that are not significant are commonly marked abnormal, but do not represent any medical problem for you.  Best regards, Zoella Roberti, M.D.

## 2020-09-03 ENCOUNTER — Ambulatory Visit (INDEPENDENT_AMBULATORY_CARE_PROVIDER_SITE_OTHER): Payer: Medicare Other | Admitting: Urology

## 2020-09-03 ENCOUNTER — Other Ambulatory Visit: Payer: Self-pay

## 2020-09-03 VITALS — BP 133/83 | HR 79

## 2020-09-03 DIAGNOSIS — N138 Other obstructive and reflux uropathy: Secondary | ICD-10-CM | POA: Diagnosis not present

## 2020-09-03 DIAGNOSIS — R339 Retention of urine, unspecified: Secondary | ICD-10-CM | POA: Diagnosis not present

## 2020-09-03 DIAGNOSIS — N401 Enlarged prostate with lower urinary tract symptoms: Secondary | ICD-10-CM | POA: Diagnosis not present

## 2020-09-03 LAB — BLADDER SCAN AMB NON-IMAGING: Scan Result: 148

## 2020-09-03 MED ORDER — ALFUZOSIN HCL ER 10 MG PO TB24: 10.0000 mg | ORAL_TABLET | Freq: Two times a day (BID) | ORAL | 11 refills | Status: DC

## 2020-09-03 NOTE — Progress Notes (Signed)
spost void residual=148  Urological Symptom Review  Patient is experiencing the following symptoms: Frequent urination   Review of Systems  Gastrointestinal (upper)  : Negative for upper GI symptoms  Gastrointestinal (lower) : Negative for lower GI symptoms  Constitutional : Negative for symptoms  Skin: Negative for skin symptoms  Eyes: Negative for eye symptoms  Ear/Nose/Throat : Negative for Ear/Nose/Throat symptoms  Hematologic/Lymphatic: Negative for Hematologic/Lymphatic symptoms  Cardiovascular : Negative for cardiovascular symptoms  Respiratory : Negative for respiratory symptoms  Endocrine: Negative for endocrine symptoms  Musculoskeletal: Negative for musculoskeletal symptoms  Neurological: Negative for neurological symptoms  Psychologic: Negative for psychiatric symptoms

## 2020-09-04 LAB — URINALYSIS, ROUTINE W REFLEX MICROSCOPIC
Bilirubin, UA: NEGATIVE
Glucose, UA: NEGATIVE
Ketones, UA: NEGATIVE
Leukocytes,UA: NEGATIVE
Nitrite, UA: NEGATIVE
Protein,UA: NEGATIVE
RBC, UA: NEGATIVE
Specific Gravity, UA: 1.02 (ref 1.005–1.030)
Urobilinogen, Ur: 1 mg/dL (ref 0.2–1.0)
pH, UA: 6 (ref 5.0–7.5)

## 2020-09-09 ENCOUNTER — Encounter: Payer: Self-pay | Admitting: Urology

## 2020-09-09 NOTE — Patient Instructions (Signed)
Benign Prostatic Hyperplasia  Benign prostatic hyperplasia (BPH) is an enlarged prostate gland that is caused by the normal aging process and not by cancer. The prostate is a walnut-sized gland that is involved in the production of semen. It is located in front of the rectum and below the bladder. The bladder stores urine and the urethra is the tube that carries the urine out of the body. The prostate may get bigger asa man gets older. An enlarged prostate can press on the urethra. This can make it harder to pass urine. The build-up of urine in the bladder can cause infection. Back pressure and infection may progress to bladder damage and kidney (renal) failure. What are the causes? This condition is part of a normal aging process. However, not all men develop problems from this condition. If the prostate enlarges away from the urethra, urine flow will not be blocked. If it enlarges toward the urethra andcompresses it, there will be problems passing urine. What increases the risk? This condition is more likely to develop in men over the age of 50 years. What are the signs or symptoms? Symptoms of this condition include: Getting up often during the night to urinate. Needing to urinate frequently during the day. Difficulty starting urine flow. Decrease in size and strength of your urine stream. Leaking (dribbling) after urinating. Inability to pass urine. This needs immediate treatment. Inability to completely empty your bladder. Pain when you pass urine. This is more common if there is also an infection. Urinary tract infection (UTI). How is this diagnosed? This condition is diagnosed based on your medical history, a physical exam, and your symptoms. Tests will also be done, such as: A post-void bladder scan. This measures any amount of urine that may remain in your bladder after you finish urinating. A digital rectal exam. In a rectal exam, your health care provider checks your prostate by  putting a lubricated, gloved finger into your rectum to feel the back of your prostate gland. This exam detects the size of your gland and any abnormal lumps or growths. An exam of your urine (urinalysis). A prostate specific antigen (PSA) screening. This is a blood test used to screen for prostate cancer. An ultrasound. This test uses sound waves to electronically produce a picture of your prostate gland. Your health care provider may refer you to a specialist in kidney and prostate diseases (urologist). How is this treated? Once symptoms begin, your health care provider will monitor your condition (active surveillance or watchful waiting). Treatment for this condition will depend on the severity of your condition. Treatment may include: Observation and yearly exams. This may be the only treatment needed if your condition and symptoms are mild. Medicines to relieve your symptoms, including: Medicines to shrink the prostate. Medicines to relax the muscle of the prostate. Surgery in severe cases. Surgery may include: Prostatectomy. In this procedure, the prostate tissue is removed completely through an open incision or with a laparoscope or robotics. Transurethral resection of the prostate (TURP). In this procedure, a tool is inserted through the opening at the tip of the penis (urethra). It is used to cut away tissue of the inner core of the prostate. The pieces are removed through the same opening of the penis. This removes the blockage. Transurethral incision (TUIP). In this procedure, small cuts are made in the prostate. This lessens the prostate's pressure on the urethra. Transurethral microwave thermotherapy (TUMT). This procedure uses microwaves to create heat. The heat destroys and removes a small   amount of prostate tissue. Transurethral needle ablation (TUNA). This procedure uses radio frequencies to destroy and remove a small amount of prostate tissue. Interstitial laser coagulation (ILC).  This procedure uses a laser to destroy and remove a small amount of prostate tissue. Transurethral electrovaporization (TUVP). This procedure uses electrodes to destroy and remove a small amount of prostate tissue. Prostatic urethral lift. This procedure inserts an implant to push the lobes of the prostate away from the urethra. Follow these instructions at home: Take over-the-counter and prescription medicines only as told by your health care provider. Monitor your symptoms for any changes. Contact your health care provider with any changes. Avoid drinking large amounts of liquid before going to bed or out in public. Avoid or reduce how much caffeine or alcohol you drink. Give yourself time when you urinate. Keep all follow-up visits as told by your health care provider. This is important. Contact a health care provider if: You have unexplained back pain. Your symptoms do not get better with treatment. You develop side effects from the medicine you are taking. Your urine becomes very dark or has a bad smell. Your lower abdomen becomes distended and you have trouble passing your urine. Get help right away if: You have a fever or chills. You suddenly cannot urinate. You feel lightheaded, or very dizzy, or you faint. There are large amounts of blood or clots in the urine. Your urinary problems become hard to manage. You develop moderate to severe low back or flank pain. The flank is the side of your body between the ribs and the hip. These symptoms may represent a serious problem that is an emergency. Do not wait to see if the symptoms will go away. Get medical help right away. Call your local emergency services (911 in the [place] Do not drive yourself to the hospital. Summary Benign prostatic hyperplasia (BPH) is an enlarged prostate that is caused by the normal aging process and not by cancer. An enlarged prostate can press on the urethra. This can make it hard to pass urine. This  condition is part of a normal aging process and is more likely to develop in men over the age of 50 years. Get help right away if you suddenly cannot urinate. This information is not intended to replace advice given to you by your health care provider. Make sure you discuss any questions you have with your healthcare provider. Document Revised: 11/15/2019 Document Reviewed: 11/15/2019 Elsevier Patient Education  2022 Elsevier Inc.  

## 2020-09-09 NOTE — Progress Notes (Signed)
09/03/2020 9:44 AM   Mark Benjamin [birth year] [id number]  Referring provider: Claretta Fraise, MD [place],  [place] [id number]  Followup BPH   HPI: Mark Benjamin is 60yo here for followuop for BPH. He has stable LUTS on uroxatral 10mg  BID. Stream fair. He has urinary urgency and frequency which is stable. No incontinence. Patient has parkinsons and uses a walker. 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    Other and unspecified hyperlipidemia    Parkinson disease ([place])    Schizophrenia ([place])     Surgical History: Past Surgical History:  Procedure Laterality Date   COLONOSCOPY  02/13/08   COLONOSCOPY N/A 07/10/2012   Procedure: COLONOSCOPY;  Surgeon: Rogene Houston, MD;  Location: [facility];  Service: Endoscopy;  Laterality: N/A;  [place] N/A 10/24/2017   Procedure: CYSTOSCOPY WITH INSERTION OF UROLIFT;  Surgeon: Cleon Gustin, MD;  Location: [facility] ORS;  Service: Urology;  Laterality: N/A;   UPPER GASTROINTESTINAL ENDOSCOPY  EGD ED   09/09/2010   URETHRAL DILATION  1965    Home Medications:  Allergies as of 09/03/2020       Reactions   Amoxicillin    Dizzy and blurred vision   Erythromycin    Penicillins    Sulfamethoxazole-trimethoprim Other (See Comments)   Extreme weakness/lethorgy   Zocor [simvastatin]         Medication List        Accurate as of September 03, 2020 11:59 PM. If you have any questions, ask your nurse or doctor.          acetaminophen 500 MG tablet Commonly known as: TYLENOL Take 1,000 mg by mouth every 6 (six) hours as needed (for fever.).   alfuzosin 10 MG 24 hr tablet Commonly known as: UROXATRAL Take 1 tablet (10 mg total) by mouth in the morning and at bedtime.   benztropine  1 MG tablet Commonly known as: COGENTIN Take 1 mg by mouth 2 (two) times daily.   calcium-vitamin D 500-200 MG-UNIT tablet Commonly known as: OSCAL WITH D Take 1 tablet by mouth.   carbidopa-levodopa 25-100 MG tablet Commonly known as: SINEMET IR 1 tab at 8am, 1 tab at 2pm, 1.5 tabs at 8pm   chlorhexidine 0.12 % solution Commonly known as: PERIDEX Use as directed 15 mLs in the mouth or throat 2 (two) times daily.   cholecalciferol 25 MCG (1000 UNIT) tablet Commonly known as: VITAMIN D Take 3,000 Units by mouth daily.   Cranberry 500 MG Chew Chew 500 mg by mouth daily.   divalproex 250 MG DR tablet Commonly known as: DEPAKOTE Take 250 mg by mouth 2 (two) times daily.   docusate sodium 100 MG capsule Commonly known as: COLACE Take 100 mg by mouth daily.   loperamide 2 MG capsule Commonly known as: IMODIUM Take 2 mg by mouth See admin instructions. Give 1 capsule (2 mg) by mouth with each loose stool up to 8 doses in 24 hr   magnesium hydroxide 400 MG/5ML suspension Commonly known as: MILK OF MAGNESIA Take 30 mLs by mouth daily as needed (for constipation.).   multivitamin with minerals Tabs tablet Take 1 tablet by mouth  daily.   Polyethyl Glycol-Propyl Glycol 0.4-0.3 % Soln Place 1 drop into both eyes 4 (four) times daily as needed (for dry/irritated eyes (wait 3-5 min between different eye drops)).   Probiotic Caps Take 1 capsule by mouth 2 (two) times daily. What changed: when to take this   risperiDONE 2 MG tablet Commonly known as: RISPERDAL Take 2 mg by mouth at bedtime.   rosuvastatin 5 MG tablet Commonly known as: Crestor Take 0.5 tablets (2.5 mg total) by mouth every other day. Take 1/2 tablet on Monday, Wednesday and Friday   Sodium Fluoride 1.1 % Pste Place 1 application onto teeth at bedtime.        Allergies:  Allergies  Allergen Reactions   Amoxicillin     Dizzy and blurred vision   Erythromycin    Penicillins     Sulfamethoxazole-Trimethoprim Other (See Comments)    Extreme weakness/lethorgy   Zocor [Simvastatin]     Family History: Family History  Problem Relation Age of Onset   Heart failure Mother    ALS Father    Hyperlipidemia Sister    Inflammatory bowel disease Paternal Grandfather     Social History:  reports that he has never smoked. He has never used smokeless tobacco. He reports that he does not drink alcohol and does not use drugs.  ROS: All other review of systems were reviewed and are negative except what is noted above in HPI  Physical Exam: BP 133/83   Pulse 79   Constitutional:  Alert and oriented, No acute distress. HEENT: [place] AT, moist mucus membranes.  Trachea midline, no masses. Cardiovascular: No clubbing, cyanosis, or edema. Respiratory: Normal respiratory effort, no increased work of breathing. GI: Abdomen is soft, nontender, nondistended, no abdominal masses GU: No CVA tenderness.  Lymph: No cervical or inguinal lymphadenopathy. Skin: No rashes, bruises or suspicious lesions. Neurologic: Grossly intact, no focal deficits, moving all 4 extremities. Psychiatric: Normal mood and affect.  Laboratory Data: Lab Results  Component Value Date   WBC 5.0 07/28/2020   HGB 13.3 07/28/2020   HCT 39.3 07/28/2020   MCV 93 07/28/2020   PLT 191 07/28/2020    Lab Results  Component Value Date   CREATININE 0.89 07/28/2020    Lab Results  Component Value Date   PSA 0.80 04/24/2013   PSA 0.7 10/30/2012   PSA 0.73 06/22/2012    No results found for: TESTOSTERONE  Lab Results  Component Value Date   HGBA1C 4.8 11/08/2013    Urinalysis    Component Value Date/Time   APPEARANCEUR Clear 09/03/2020 1547   GLUCOSEU Negative 09/03/2020 1547   BILIRUBINUR Negative 09/03/2020 1547   PROTEINUR Negative 09/03/2020 1547   UROBILINOGEN negative 03/19/2015 1138   NITRITE Negative 09/03/2020 1547   LEUKOCYTESUR Negative 09/03/2020 1547    Lab Results  Component  Value Date   LABMICR Comment 03/07/2020   WBCUA None seen 12/05/2019   RBCUA None seen 11/28/2017   LABEPIT None seen 12/05/2019   MUCUS occ 03/19/2015   BACTERIA None seen 12/05/2019    Pertinent Imaging:  No results found for this or any previous visit.  No results found for this or any previous visit.  No results found for this or any previous visit.  No results found for this or any previous visit.  No results found for this or any previous visit.  No results found for this or any previous visit.  No results found for this or any previous visit.  No results found  for this or any previous visit.   Assessment & Plan:    1. Benign prostatic hyperplasia with urinary obstruction Continue Uroxatral 10mg  BID - BLADDER SCAN AMB NON-IMAGING - Urinalysis, Routine w reflex microscopic  2. Incomplete emptying of bladder Continue uroxatral 10mg  BID   Return in about 1 year (around 09/03/2021) for PVR.  Nicolette Bang, MD  [facility] [place]

## 2020-10-07 ENCOUNTER — Ambulatory Visit: Payer: Self-pay | Admitting: Diagnostic Neuroimaging

## 2020-10-28 ENCOUNTER — Ambulatory Visit: Payer: Medicare Other | Admitting: Family Medicine

## 2021-06-22 ENCOUNTER — Ambulatory Visit: Payer: Medicare Other | Admitting: Diagnostic Neuroimaging

## 2021-06-29 ENCOUNTER — Ambulatory Visit: Payer: Medicare Other | Admitting: Diagnostic Neuroimaging

## 2021-07-21 ENCOUNTER — Encounter: Payer: Self-pay | Admitting: Diagnostic Neuroimaging

## 2021-07-21 ENCOUNTER — Ambulatory Visit (INDEPENDENT_AMBULATORY_CARE_PROVIDER_SITE_OTHER): Payer: Medicare Other | Admitting: Diagnostic Neuroimaging

## 2021-07-21 VITALS — BP 126/78 | HR 80 | Ht 73.0 in | Wt 159.3 lb

## 2021-07-21 DIAGNOSIS — G20 Parkinson's disease: Secondary | ICD-10-CM | POA: Diagnosis not present

## 2021-07-21 DIAGNOSIS — M4802 Spinal stenosis, cervical region: Secondary | ICD-10-CM | POA: Diagnosis not present

## 2021-07-21 NOTE — Progress Notes (Signed)
? ? ?[facility] ? ?PATIENT: Mark Benjamin ?DOB: 11-Apr-1960 ? ?REFERRING CLINICIAN: Claretta Fraise, MD  ?HISTORY FROM: patient and sister ?REASON FOR VISIT: follow up ? ? ?HISTORICAL ? ?CHIEF COMPLAINT:  ?Chief Complaint  ?Patient presents with  ? Parkinson's disease  ?  Rm 6 one year FU sister- [name]  "having hard time getting in/out of chair, has a lift chair; speech issues"  ? ? ?HISTORY OF PRESENT ILLNESS:  ? ?UPDATE (07/21/21, VRP): Since last visit, sxs progressing, with more gait and balance issues. Tolerating meds.   ? ?UPDATE (06/25/20, VRP): Since last visit, had more delusions, and then risperidone was increased. Continues on carb/levo, and PD slightly worse. No alleviating or aggravating factors. Tolerating meds.   ? ?UPDATE (10/01/19, VRP): Since last visit, doing well overall, but some progressive of sxs. More left hand dystonia and pain. Symptoms are progressive. No alleviating or aggravating factors. Tolerating meds. More balance difficulty.   ? ?UPDATE (05/08/18, VRP): Since last visit, having more generalized weakness, malaise, and lack of interest in activities. Symptoms are progressive. More left hand stiffness and dystonia. No alleviating or aggravating factors. Tolerating meds.   ? ?UPDATE (08/01/17, VRP): Since last visit, doing well, with improve gait and walking. Tremor stable. Tolerating meds. No alleviating or aggravating factors. No delusions / hallucinations. ? ?UPDATE (01/31/17, VRP): Since last visit, doing well. Tolerating carb/levo. No alleviating or aggravating factors.  ? ?UPDATE 09/29/16: Since last visit, tremors and coordination have worsened. Balance poor. Had a minor fall 3 weeks ago. Sister is looking at options for PACE vs assisted living. She is concerned about his safety to live alone. He is no longer driving. They were not able to afford [employer] ($14k out of pocket).  ? ?UPDATE 06/15/16: Since last visit, tremor, slowness, stiffness continue. Here to look at  other options for treatment. Schizophrenia stable. More balance and coordination issues. Patient lives alone. Driving short distances.  ? ?UPDATE 09/22/15 (VRP): Since last visit, tremor and parkinsonism sxs continue. Saw Dr. Olga Millers, but was told that his spine issues are not amenable to surgical treatment.  ? ?UPDATE 06/04/14 (VRP): Since last visit, patient has transitioned from thioridazine to risperdal and cogentin and depakote. Left arm symptoms are stable / slightly improved. Patient not interested in surgical treatment of c-spine surg; also, was told by neurosurg that his neck issues do not need surg decompression. ? ?UPDATE 08/10/13 (LL): Patient states that he was evaluated by Dr. Vertell Limber for cervical stenosis and was told that there was nothing seen on Myelogram that needed surgery.  His Thioridazine dose is now 100 mg.  Symptoms are stable.  He was advised by PCP to cut out artificial sweeteners to see if that changed his symptoms; he did so 2 weeks ago. He complains of mild left leg weakness. ? ?UPDATE 02/26/13 (VRP): Since last visit patient had MRI of the brain and cervical spine which shows moderate spinal stenosis at C5-6 level with severe bilateral foraminal stenosis. Also mild spinal stenosis and moderate right foraminal stenosis at C6-7. No cord signal abnormalities. Since last visit thioridazine dosing has been reduced. Overall his symptoms are stable to slightly progressed. ? ?PRIOR HPI (11/21/12, VRP): 61 year old left-handed male with history of diabetes, hypercholesterolemia, schizophrenia, on thioridazine since 1986, here for evaluation of left arm weakness and numbness since March 2014. Patient reports gradual onset, progressive numbness and weakness and slowness of his left arm. He denies any symptoms in his right arm or legs. No facial  droop or facial numbness. He has noticed some slurred speech over the past one week. Patient is left-handed and has noticed difficulty with brushing his teeth with  his left hand, handwriting and other fine movements. Patient's psychiatrist reduced thioridazine dose from 300 mg at bedtime down to 200 mg at bedtime last month, out of consideration of the patient's presenting symptoms as a potential side effect of medication. Since reducing the dose, no change in symptoms. ? ? ?REVIEW OF SYSTEMS: Full 14 system review of systems performed and negative: as per HPI. ? ? ?ALLERGIES: ?Allergies  ?Allergen Reactions  ? Amoxicillin   ?  Dizzy and blurred vision  ? Erythromycin   ? Penicillins   ? Sulfamethoxazole-Trimethoprim Other (See Comments)  ?  Extreme weakness/lethorgy  ? Zocor [Simvastatin]   ? ? ?HOME MEDICATIONS: ?Outpatient Medications Prior to Visit  ?Medication Sig Dispense Refill  ? acetaminophen (TYLENOL) 500 MG tablet Take 1,000 mg by mouth every 6 (six) hours as needed (for fever.).    ? alfuzosin (UROXATRAL) 10 MG 24 hr tablet Take 1 tablet (10 mg total) by mouth in the morning and at bedtime. 60 tablet 11  ? benztropine (COGENTIN) 1 MG tablet Take 1 mg by mouth 2 (two) times daily.     ? carbidopa-levodopa (SINEMET IR) 25-100 MG tablet 1 tab at 8am, 1 tab at 2pm, 1.5 tabs at 8pm 120 tablet 12  ? chlorhexidine (PERIDEX) 0.12 % solution Use as directed 15 mLs in the mouth or throat 2 (two) times daily.    ? cholecalciferol (VITAMIN D) 1000 units tablet Take 3,000 Units by mouth daily.    ? diclofenac Sodium (VOLTAREN) 1 % GEL Apply topically.    ? divalproex (DEPAKOTE) 250 MG DR tablet Take 250 mg by mouth 2 (two) times daily.     ? docusate sodium (COLACE) 100 MG capsule Take 100 mg by mouth daily.     ? doxepin (SINEQUAN) 10 MG capsule Take 10 mg by mouth daily.    ? gabapentin (NEURONTIN) 100 MG capsule Take 100 mg by mouth at bedtime.    ? magnesium hydroxide (MILK OF MAGNESIA) 400 MG/5ML suspension Take 30 mLs by mouth daily as needed (for constipation.).    ? Multiple Vitamin (MULTIVITAMIN WITH MINERALS) TABS tablet Take 1 tablet by mouth daily.    ? Polyethyl  Glycol-Propyl Glycol 0.4-0.3 % SOLN Place 1 drop into both eyes 4 (four) times daily as needed (for dry/irritated eyes (wait 3-5 min between different eye drops)).     ? Probiotic CAPS Take 1 capsule by mouth 2 (two) times daily. (Patient taking differently: Take 1 capsule by mouth daily.) 60 capsule 5  ? risperiDONE (RISPERDAL) 2 MG tablet Take 2 mg by mouth at bedtime.    ? rosuvastatin (CRESTOR) 5 MG tablet Take 0.5 tablets (2.5 mg total) by mouth every other day. Take 1/2 tablet on Monday, Wednesday and Friday 45 tablet 3  ? Sodium Fluoride 1.1 % PSTE Place 1 application onto teeth at bedtime.    ? calcium-vitamin D (OSCAL WITH D) 500-200 MG-UNIT tablet Take 1 tablet by mouth. (Patient not taking: Reported on 07/21/2021)    ? Cranberry 500 MG CHEW Chew 500 mg by mouth daily. (Patient not taking: Reported on 09/03/2020)    ? loperamide (IMODIUM) 2 MG capsule Take 2 mg by mouth See admin instructions. Give 1 capsule (2 mg) by mouth with each loose stool up to 8 doses in 24 hr (Patient not taking: Reported on  07/21/2021)    ? divalproex (DEPAKOTE) 125 MG DR tablet Take 125 mg by mouth daily.    ? ?No facility-administered medications prior to visit.  ? ? ?PAST MEDICAL HISTORY: ?Past Medical History:  ?Diagnosis Date  ? Cataract   ? Colon polyps   ? Dysphagia   ? Essential hypertension, benign   ? Fatty liver   ? Hyperplasia of prostate   ? 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 Other and unspecified hyperlipidemia   ? Parkinson disease ([place])   ? Schizophrenia ([place])   ? ? ?PAST SURGICAL HISTORY: ?Past Surgical History:  ?Procedure Laterality Date  ? COLONOSCOPY  02/13/08  ? COLONOSCOPY N/A 07/10/2012  ? Procedure: COLONOSCOPY;  Surgeon: Rogene Houston, MD;  Location: [facility];  Service: Endoscopy;  Laterality: N/A;  225  ? CYSTOSCOPY WITH INSERTION OF UROLIFT N/A 10/24/2017  ? Procedure: CYSTOSCOPY WITH INSERTION OF UROLIFT;  Surgeon: Cleon Gustin, MD;  Location: [facility];  Service:  Urology;  Laterality: N/A;  ? UPPER GASTROINTESTINAL ENDOSCOPY  EGD ED  ? 09/09/2010  ? [place]  ? ? ?FAMILY HISTORY: ?Family History  ?Problem Relation Age of Onset  ? Heart failure Mother   ?

## 2021-07-22 ENCOUNTER — Telehealth: Payer: Self-pay

## 2021-07-22 MED ORDER — GLYCOPYRROLATE 1 MG PO TABS: 1.0000 mg | ORAL_TABLET | Freq: Two times a day (BID) | ORAL | 5 refills | Status: AC

## 2021-07-22 NOTE — Telephone Encounter (Signed)
Received fax from facility( [facility]) asking for order for Robinul to help with excessive salivation. Per plan from 07/21/21 plan was to consider in the future. Will ask MD if ok to proceed with order now.  ?

## 2021-07-22 NOTE — Telephone Encounter (Signed)
Per order from Dr. Leta Baptist  ?Penumalli, Earlean Polka, MD  You 19 minutes ago (9:13 AM)  ? ?Glycopyrrolate '1mg'$  twice a day (for sialorrhea).  ? ?-VRP   ?I have added to the chart along with the facility order form. Order form placed on MD's desk for signature.  ?

## 2021-07-22 NOTE — Addendum Note (Signed)
Addended by: Verlin Grills on: 07/22/2021 09:42 AM ? ? Modules accepted: Orders ? ?

## 2021-07-23 NOTE — Telephone Encounter (Signed)
I have faxed order sheet back to the facility [facility]) with new order included.  ? ?Confirmation received.  ?

## 2021-08-18 ENCOUNTER — Ambulatory Visit: Payer: Medicare Other | Admitting: Diagnostic Neuroimaging

## 2021-09-02 ENCOUNTER — Ambulatory Visit: Payer: Medicare Other | Admitting: Urology

## 2021-09-07 ENCOUNTER — Ambulatory Visit (INDEPENDENT_AMBULATORY_CARE_PROVIDER_SITE_OTHER): Payer: Medicare Other | Admitting: Urology

## 2021-09-07 ENCOUNTER — Encounter: Payer: Self-pay | Admitting: Urology

## 2021-09-07 VITALS — BP 133/84 | HR 86

## 2021-09-07 DIAGNOSIS — N401 Enlarged prostate with lower urinary tract symptoms: Secondary | ICD-10-CM

## 2021-09-07 DIAGNOSIS — R339 Retention of urine, unspecified: Secondary | ICD-10-CM

## 2021-09-07 DIAGNOSIS — N138 Other obstructive and reflux uropathy: Secondary | ICD-10-CM | POA: Diagnosis not present

## 2021-09-07 LAB — URINALYSIS, ROUTINE W REFLEX MICROSCOPIC
Bilirubin, UA: NEGATIVE
Glucose, UA: NEGATIVE
Ketones, UA: NEGATIVE
Leukocytes,UA: NEGATIVE
Nitrite, UA: NEGATIVE
Protein,UA: NEGATIVE
RBC, UA: NEGATIVE
Specific Gravity, UA: 1.02 (ref 1.005–1.030)
Urobilinogen, Ur: 4 mg/dL — ABNORMAL HIGH (ref 0.2–1.0)
pH, UA: 6.5 (ref 5.0–7.5)

## 2021-09-07 LAB — BLADDER SCAN AMB NON-IMAGING: Scan Result: 245

## 2021-09-07 MED ORDER — SILODOSIN 8 MG PO CAPS: 8.0000 mg | ORAL_CAPSULE | Freq: Every day | ORAL | 11 refills | Status: DC

## 2021-09-07 NOTE — Progress Notes (Signed)
09/07/2021 9:38 AM   Mark Benjamin [birth year] [id number]  Referring provider: Claretta Fraise, MD [place],  [place] [id number]  Followup BPH   HPI: [name] is a 61yo here for followup for BPH with incomplete emptying. PVR 245cc. He is on uroxatral '10mg'$  BID. IPSS 5 QOl 5.  He has nocturia 3-4x which bothers him. Urine stream is intermittently weak. He has parkinsons and uses a Engineer, water. NO other complaints today. 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    Other and unspecified hyperlipidemia    Parkinson disease ([place])    Schizophrenia ([place])     Surgical History: Past Surgical History:  Procedure Laterality Date   COLONOSCOPY  02/13/08   COLONOSCOPY N/A 07/10/2012   Procedure: COLONOSCOPY;  Surgeon: Rogene Houston, MD;  [facility];  Service: Endoscopy;  Laterality: N/A;  [place] N/A 10/24/2017   Procedure: CYSTOSCOPY WITH INSERTION OF UROLIFT;  Surgeon: Cleon Gustin, MD;  Location: [facility];  Service: Urology;  Laterality: N/A;   UPPER GASTROINTESTINAL ENDOSCOPY  EGD ED   09/09/2010   URETHRAL DILATION  1965    Home Medications:  Allergies as of 09/07/2021       Reactions   Amoxicillin    Dizzy and blurred vision   Erythromycin    Penicillins    Sulfamethoxazole-trimethoprim Other (See Comments)   Extreme weakness/lethorgy   Zocor [simvastatin]         Medication List        Accurate as of September 07, 2021  9:38 AM. If you have any questions, ask your nurse or doctor.          acetaminophen 500 MG tablet Commonly known as: TYLENOL Take 1,000 mg by mouth every 6 (six) hours as needed (for fever.).   alfuzosin 10 MG 24 hr tablet Commonly known as: UROXATRAL Take 1 tablet (10 mg total) by mouth in the  morning and at bedtime.   benztropine 1 MG tablet Commonly known as: COGENTIN Take 1 mg by mouth 2 (two) times daily.   calcium-vitamin D 500-200 MG-UNIT tablet Commonly known as: OSCAL WITH D Take 1 tablet by mouth.   carbidopa-levodopa 25-100 MG tablet Commonly known as: SINEMET IR 1 tab at 8am, 1 tab at 2pm, 1.5 tabs at 8pm   chlorhexidine 0.12 % solution Commonly known as: PERIDEX Use as directed 15 mLs in the mouth or throat 2 (two) times daily.   cholecalciferol 25 MCG (1000 UNIT) tablet Commonly known as: VITAMIN D Take 3,000 Units by mouth daily.   Cranberry 500 MG Chew Chew 500 mg by mouth daily.   diclofenac Sodium 1 % Gel Commonly known as: VOLTAREN Apply topically.   divalproex 250 MG DR tablet Commonly known as: DEPAKOTE Take 250 mg by mouth 2 (two) times daily.   divalproex 125 MG DR tablet Commonly known as: DEPAKOTE Take 125 mg by mouth daily.   docusate sodium 100 MG capsule Commonly known as: COLACE Take 100 mg by mouth 2 (two) times daily.   doxepin 10 MG capsule Commonly known as: SINEQUAN Take 10 mg by mouth daily.   gabapentin 100 MG capsule Commonly known as: NEURONTIN Take  100 mg by mouth at bedtime.   glycopyrrolate 1 MG tablet Commonly known as: ROBINUL Take 1 tablet (1 mg total) by mouth 2 (two) times daily.   loperamide 2 MG capsule Commonly known as: IMODIUM Take 2 mg by mouth See admin instructions. Give 1 capsule (2 mg) by mouth with each loose stool up to 8 doses in 24 hr   magnesium hydroxide 400 MG/5ML suspension Commonly known as: MILK OF MAGNESIA Take 30 mLs by mouth daily as needed (for constipation.).   multivitamin with minerals Tabs tablet Take 1 tablet by mouth daily.   Polyethyl Glycol-Propyl Glycol 0.4-0.3 % Soln Place 1 drop into both eyes 4 (four) times daily as needed (for dry/irritated eyes (wait 3-5 min between different eye drops)).   Probiotic Caps Take 1 capsule by mouth 2 (two) times daily. What  changed: when to take this   risperiDONE 2 MG tablet Commonly known as: RISPERDAL Take 2 mg by mouth at bedtime.   rosuvastatin 5 MG tablet Commonly known as: Crestor Take 0.5 tablets (2.5 mg total) by mouth every other day. Take 1/2 tablet on Monday, Wednesday and Friday   Sodium Fluoride 1.1 % Pste Place 1 application onto teeth at bedtime.        Allergies:  Allergies  Allergen Reactions   Amoxicillin     Dizzy and blurred vision   Erythromycin    Penicillins    Sulfamethoxazole-Trimethoprim Other (See Comments)    Extreme weakness/lethorgy   Zocor [Simvastatin]     Family History: Family History  Problem Relation Age of Onset   Heart failure Mother    ALS Father    Hyperlipidemia Sister    Inflammatory bowel disease Paternal Grandfather     Social History:  reports that he has never smoked. He has never used smokeless tobacco. He reports that he does not drink alcohol and does not use drugs.  ROS: All other review of systems were reviewed and are negative except what is noted above in HPI  Physical Exam: BP 133/84   Pulse 86   Constitutional:  Alert and oriented, No acute distress. HEENT: [place] AT, moist mucus membranes.  Trachea midline, no masses. Cardiovascular: No clubbing, cyanosis, or edema. Respiratory: Normal respiratory effort, no increased work of breathing. GI: Abdomen is soft, nontender, nondistended, no abdominal masses GU: No CVA tenderness.  Lymph: No cervical or inguinal lymphadenopathy. Skin: No rashes, bruises or suspicious lesions. Neurologic: Grossly intact, no focal deficits, moving all 4 extremities. Psychiatric: Normal mood and affect.  Laboratory Data: Lab Results  Component Value Date   WBC 5.0 07/28/2020   HGB 13.3 07/28/2020   HCT 39.3 07/28/2020   MCV 93 07/28/2020   PLT 191 07/28/2020    Lab Results  Component Value Date   CREATININE 0.89 07/28/2020    Lab Results  Component Value Date   PSA 0.80 04/24/2013   PSA  0.7 10/30/2012   PSA 0.73 06/22/2012    No results found for: "TESTOSTERONE"  Lab Results  Component Value Date   HGBA1C 4.8 11/08/2013    Urinalysis    Component Value Date/Time   APPEARANCEUR Clear 09/03/2020 1547   GLUCOSEU Negative 09/03/2020 1547   BILIRUBINUR Negative 09/03/2020 1547   PROTEINUR Negative 09/03/2020 1547   UROBILINOGEN negative 03/19/2015 1138   NITRITE Negative 09/03/2020 1547   LEUKOCYTESUR Negative 09/03/2020 1547    Lab Results  Component Value Date   LABMICR Comment 03/07/2020   WBCUA None seen 12/05/2019   RBCUA  None seen 11/28/2017   LABEPIT None seen 12/05/2019   MUCUS occ 03/19/2015   BACTERIA None seen 12/05/2019    Pertinent Imaging:  No results found for this or any previous visit.  No results found for this or any previous visit.  No results found for this or any previous visit.  No results found for this or any previous visit.  No results found for this or any previous visit.  No results found for this or any previous visit.  No results found for this or any previous visit.  No results found for this or any previous visit.   Assessment & Plan:    1. Incomplete emptying of bladder -Start rapaflo '8mg'$  daily, stop uroxatral  - BLADDER SCAN AMB NON-IMAGING  2. Benign prostatic hyperplasia with urinary obstruction -Rapaflo '8mg'$  daily - Urinalysis, Routine w reflex microscopic   No follow-ups on file.  Nicolette Bang, MD  [facility]rology [place]

## 2021-09-07 NOTE — Patient Instructions (Signed)

## 2021-09-07 NOTE — Progress Notes (Signed)
post void residual= 245

## 2021-09-08 ENCOUNTER — Telehealth: Payer: Self-pay

## 2021-09-08 NOTE — Telephone Encounter (Signed)
Appeals letter sent to [facility] for approval of Silodosin.

## 2021-09-11 ENCOUNTER — Telehealth: Payer: Self-pay

## 2021-12-08 ENCOUNTER — Ambulatory Visit: Payer: Medicare Other | Admitting: Diagnostic Neuroimaging

## 2022-06-08 ENCOUNTER — Encounter (INDEPENDENT_AMBULATORY_CARE_PROVIDER_SITE_OTHER): Payer: Self-pay | Admitting: *Deleted

## 2022-07-05 ENCOUNTER — Ambulatory Visit (INDEPENDENT_AMBULATORY_CARE_PROVIDER_SITE_OTHER): Payer: Medicare Other | Admitting: Diagnostic Neuroimaging

## 2022-07-05 ENCOUNTER — Encounter: Payer: Self-pay | Admitting: Diagnostic Neuroimaging

## 2022-07-05 VITALS — BP 128/71 | HR 91 | Ht 73.0 in | Wt 152.0 lb

## 2022-07-05 DIAGNOSIS — G20B2 Parkinson's disease with dyskinesia, with fluctuations: Secondary | ICD-10-CM | POA: Diagnosis not present

## 2022-07-05 NOTE — Progress Notes (Signed)
[facility]  PATIENT: Mark Benjamin DOB: [birth year]  REFERRING CLINICIAN: Mechele Claude, MD  HISTORY FROM: patient and sister REASON FOR VISIT: follow up   HISTORICAL  CHIEF COMPLAINT:  Chief Complaint  Patient presents with   Follow-up    Pt with sister. Rm 6. Here due to having worsening balance concerns. Increase in falls. They have been working with OT. Helps some.  He has off/on has been having hallucinations (sees spiders in room and then it goes away) they have a psych nurse in the facility that helps with carrying. Last time he fell he complete imaging to head and that was [place]    HISTORY OF PRESENT ILLNESS:   UPDATE (07/05/22, VRP): Since last visit, progressive gait and balance diff. More stiffness and weakness. Tolerating meds. 3 falls in the last 4 months. Living in assisted living.   UPDATE (07/21/21, VRP): Since last visit, sxs progressing, with more gait and balance issues. Tolerating meds.    UPDATE (06/25/20, VRP): Since last visit, had more delusions, and then risperidone was increased. Continues on carb/levo, and PD slightly worse. No alleviating or aggravating factors. Tolerating meds.    UPDATE (10/01/19, VRP): Since last visit, doing well overall, but some progressive of sxs. More left hand dystonia and pain. Symptoms are progressive. No alleviating or aggravating factors. Tolerating meds. More balance difficulty.    UPDATE (05/08/18, VRP): Since last visit, having more generalized weakness, malaise, and lack of interest in activities. Symptoms are progressive. More left hand stiffness and dystonia. No alleviating or aggravating factors. Tolerating meds.    UPDATE (08/01/17, VRP): Since last visit, doing well, with improve gait and walking. Tremor stable. Tolerating meds. No alleviating or aggravating factors. No delusions / hallucinations.  UPDATE (01/31/17, VRP): Since last visit, doing well. Tolerating carb/levo. No alleviating or  aggravating factors.   UPDATE 09/29/16: Since last visit, tremors and coordination have worsened. Balance poor. Had a minor fall 3 weeks ago. Sister is looking at options for PACE vs assisted living. She is concerned about his safety to live alone. He is no longer driving. They were not able to afford [facility] ($14k out of pocket).   UPDATE 06/15/16: Since last visit, tremor, slowness, stiffness continue. Here to look at other options for treatment. Schizophrenia stable. More balance and coordination issues. Patient lives alone. Driving short distances.   UPDATE 09/22/15 (VRP): Since last visit, tremor and parkinsonism sxs continue. Saw Dr. Pleas Koch, but was told that his spine issues are not amenable to surgical treatment.   UPDATE 06/04/14 (VRP): Since last visit, patient has transitioned from thioridazine to risperdal and cogentin and depakote. Left arm symptoms are stable / slightly improved. Patient not interested in surgical treatment of c-spine surg; also, was told by neurosurg that his neck issues do not need surg decompression.  UPDATE 08/10/13 (LL): Patient states that he was evaluated by Dr. Venetia Maxon for cervical stenosis and was told that there was nothing seen on Myelogram that needed surgery.  His Thioridazine dose is now 100 mg.  Symptoms are stable.  He was advised by PCP to cut out artificial sweeteners to see if that changed his symptoms; he did so 2 weeks ago. He complains of mild left leg weakness.  UPDATE 02/26/13 (VRP): Since last visit patient had MRI of the brain and cervical spine which shows moderate spinal stenosis at C5-6 level with severe bilateral foraminal stenosis. Also mild spinal stenosis and moderate right foraminal stenosis at C6-7. No cord  signal abnormalities. Since last visit thioridazine dosing has been reduced. Overall his symptoms are stable to slightly progressed.  PRIOR HPI (11/21/12, VRP): 62 year old left-handed male with history of diabetes, hypercholesterolemia,  schizophrenia, on thioridazine since 1986, here for evaluation of left arm weakness and numbness since March 2014. Patient reports gradual onset, progressive numbness and weakness and slowness of his left arm. He denies any symptoms in his right arm or legs. No facial droop or facial numbness. He has noticed some slurred speech over the past one week. Patient is left-handed and has noticed difficulty with brushing his teeth with his left hand, handwriting and other fine movements. Patient's psychiatrist reduced thioridazine dose from 300 mg at bedtime down to 200 mg at bedtime last month, out of consideration of the patient's presenting symptoms as a potential side effect of medication. Since reducing the dose, no change in symptoms.   REVIEW OF SYSTEMS: Full 14 system review of systems performed and negative: as per HPI.   ALLERGIES: Allergies  Allergen Reactions   Amoxicillin     Dizzy and blurred vision   Erythromycin    Penicillins    Sulfamethoxazole-Trimethoprim Other (See Comments)    Extreme weakness/lethorgy   Zocor [Simvastatin]     HOME MEDICATIONS: Outpatient Medications Prior to Visit  Medication Sig Dispense Refill   benztropine (COGENTIN) 1 MG tablet Take 1 mg by mouth 2 (two) times daily.      carbidopa-levodopa (SINEMET IR) 25-100 MG tablet 1 tab at 8am, 1 tab at 2pm, 1.5 tabs at 8pm 120 tablet 12   divalproex (DEPAKOTE) 250 MG DR tablet Take 250 mg by mouth 3 (three) times daily.     docusate sodium (COLACE) 100 MG capsule Take 100 mg by mouth 2 (two) times daily.     doxepin (SINEQUAN) 10 MG capsule Take 10 mg by mouth daily.     gabapentin (NEURONTIN) 100 MG capsule Take 100 mg by mouth at bedtime.     glycopyrrolate (ROBINUL) 1 MG tablet Take 1 tablet (1 mg total) by mouth 2 (two) times daily. 60 tablet 5   Multiple Vitamin (MULTIVITAMIN WITH MINERALS) TABS tablet Take 1 tablet by mouth daily.     Polyethyl Glycol-Propyl Glycol 0.4-0.3 % SOLN Place 1 drop into both  eyes 4 (four) times daily as needed (for dry/irritated eyes (wait 3-5 min between different eye drops)).      risperiDONE (RISPERDAL) 0.5 MG tablet Take 0.5 mg by mouth at bedtime. Along with the 2 mg tablet to equal 2.5 mg QHS     risperiDONE (RISPERDAL) 2 MG tablet Take 2 mg by mouth 2 (two) times daily.     rosuvastatin (CRESTOR) 5 MG tablet Take 0.5 tablets (2.5 mg total) by mouth every other day. Take 1/2 tablet on Monday, Wednesday and Friday 45 tablet 3   silodosin (RAPAFLO) 8 MG CAPS capsule Take 1 capsule (8 mg total) by mouth daily with breakfast. 30 capsule 11   acetaminophen (TYLENOL) 500 MG tablet Take 1,000 mg by mouth every 6 (six) hours as needed (for fever.).     alfuzosin (UROXATRAL) 10 MG 24 hr tablet Take 1 tablet (10 mg total) by mouth in the morning and at bedtime. 60 tablet 11   calcium-vitamin D (OSCAL WITH D) 500-200 MG-UNIT tablet Take 1 tablet by mouth. (Patient not taking: Reported on 07/21/2021)     chlorhexidine (PERIDEX) 0.12 % solution Use as directed 15 mLs in the mouth or throat 2 (two) times daily.  cholecalciferol (VITAMIN D) 1000 units tablet Take 3,000 Units by mouth daily.     Cranberry 500 MG CHEW Chew 500 mg by mouth daily. (Patient not taking: Reported on 09/03/2020)     diclofenac Sodium (VOLTAREN) 1 % GEL Apply topically.     divalproex (DEPAKOTE) 125 MG DR tablet Take 125 mg by mouth daily.     loperamide (IMODIUM) 2 MG capsule Take 2 mg by mouth See admin instructions. Give 1 capsule (2 mg) by mouth with each loose stool up to 8 doses in 24 hr (Patient not taking: Reported on 07/21/2021)     magnesium hydroxide (MILK OF MAGNESIA) 400 MG/5ML suspension Take 30 mLs by mouth daily as needed (for constipation.).     Probiotic CAPS Take 1 capsule by mouth 2 (two) times daily. (Patient taking differently: Take 1 capsule by mouth daily.) 60 capsule 5   Sodium Fluoride 1.1 % PSTE Place 1 application onto teeth at bedtime.     No facility-administered medications  prior to visit.    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    Other and unspecified hyperlipidemia    Parkinson disease    Schizophrenia     PAST SURGICAL HISTORY: Past Surgical History:  Procedure Laterality Date   COLONOSCOPY  02/13/08   COLONOSCOPY N/A 07/10/2012   Procedure: COLONOSCOPY;  Surgeon: Malissa Hippo, MD;  [facility];  Service: Endoscopy;  Laterality: N/A;  225   CYSTOSCOPY WITH INSERTION OF UROLIFT N/A 10/24/2017   Procedure: CYSTOSCOPY WITH INSERTION OF UROLIFT;  Surgeon: Malen Gauze, MD;  Location: [facility];  Service: Urology;  Laterality: N/A;   UPPER GASTROINTESTINAL ENDOSCOPY  EGD ED   09/09/2010   URETHRAL DILATION  1965    FAMILY HISTORY: Family History  Problem Relation Age of Onset   Heart failure Mother    ALS Father    Hyperlipidemia Sister    Inflammatory bowel disease Paternal Grandfather     SOCIAL HISTORY:  Social History   Socioeconomic History   Marital status: Single    Spouse name: Not on file   Number of children: 0   Years of education: 12th   Highest education level: High school graduate  Occupational History    Employer: NOT EMPLOYED  Tobacco Use   Smoking status: Never   Smokeless tobacco: Never  Vaping Use   Vaping Use: Never used  Substance and Sexual Activity   Alcohol use: No   Drug use: No   Sexual activity: Not Currently    Birth control/protection: None  Other Topics Concern   Not on file  Social History Narrative   07/21/21 Patient lives at [facility], which is an assisted living facility. His sister helps with his care. He has never been married and does not have children.    Social Determinants of Health   Financial Resource Strain: Low Risk  (05/04/2017)   Overall Financial Resource  Strain (CARDIA)    Difficulty of Paying Living Expenses: Not hard at all  Food Insecurity: No Food Insecurity (05/04/2017)   Hunger Vital Sign    Worried About Running Out of Food in the Last Year: Never true    Ran Out of Food in the Last Year: Never true  Transportation Needs: No Transportation Needs (05/04/2017)   PRAPARE - Administrator, Civil Service (Medical): No    Lack of Transportation (Non-Medical): No  Physical Activity: Unknown (08/30/2018)   Exercise Vital Sign    Days of Exercise per Week: Not on file    Minutes of Exercise per Session: 10 min  Stress: Stress Concern Present (05/04/2017)   [employer] of Occupational Health - Occupational Stress Questionnaire    Feeling of Stress : To some extent  Social Connections: Unknown (08/30/2018)   Social Connection and Isolation Panel [NHANES]    Frequency of Communication with Friends and Family: Not on file    Frequency of Social Gatherings with Friends and Family: Not on file    Attends Religious Services: Never    Active Member of Clubs or Organizations: Not on file    Attends Banker Meetings: Not on file    Marital Status: Not on file  Intimate Partner Violence: Not At Risk (08/30/2018)   Humiliation, Afraid, Rape, and Kick questionnaire    Fear of Current or Ex-Partner: No    Emotionally Abused: No    Physically Abused: No    Sexually Abused: No     PHYSICAL EXAM  GENERAL EXAM/CONSTITUTIONAL: Vitals:  Vitals:   07/05/22 1617  BP: 128/71  Pulse: 91  Weight: 152 lb (68.9 kg)  Height:  (1.854 m)   Body mass index is 20.05 kg/m. Wt Readings from Last 3 Encounters:  07/05/22 152 lb (68.9 kg)  07/21/21 159 lb 4.8 oz (72.3 kg)  07/28/20 161 lb 12.8 oz (73.4 kg)   Patient is in no distress; well developed, nourished and groomed; neck is supple  CARDIOVASCULAR: Examination of carotid arteries is normal; no carotid bruits Regular rate and rhythm, no murmurs Examination of  peripheral vascular system by observation and palpation is normal  EYES: Ophthalmoscopic exam of optic discs and posterior segments is normal; no papilledema or hemorrhages No results found.  MUSCULOSKELETAL: Gait, strength, tone, movements noted in Neurologic exam below  NEUROLOGIC: MENTAL STATUS:     05/04/2017   10:33 AM 04/15/2016    9:39 AM 04/14/2015   12:14 PM  MMSE - Mini Mental State Exam  Orientation to time Orientation to Place Registration Attention/ Calculation Recall Language- name 2 objects Language- repeat Language- follow 3 step command Language- read & follow direction Write a sentence 0 1 0  Write a sentence-comments unable to write due to problem with left hand    Copy design 0 1 0  Total score awake, alert, oriented to person, place and time recent and remote memory intact normal attention and concentration language fluent, comprehension intact, naming intact fund of knowledge appropriate  CRANIAL NERVE:  2nd - no papilledema on fundoscopic exam 2nd, 3rd, 4th, 6th - pupils equal and reactive to light, visual fields full to confrontation, extraocular muscles intact, no nystagmus 5th - facial sensation symmetric 7th - facial strength symmetric 8th - hearing intact 9th - palate elevates symmetrically, uvula midline 11th - shoulder shrug symmetric 12th - tongue protrusion midline STUTTERING SPEECH MASKED FACIES  MOTOR:  INCREASED TONE IN BUE AND BLE MILD RESTING TREMOR IN LUE; DYSTONIC POSTURING IN BUE; SIGNIFICANT BRADYKINESIA IN BUE AND BLE BUE 4; BLE  3-4  SENSORY:  normal and symmetric to light touch  COORDINATION:  finger-nose-finger, fine finger movements normal  REFLEXES:  deep tendon reflexes TRACE and symmetric  GAIT/STATION:  SEVERE DIFF STANDING; FREEZING ON GAIT INITIATION; THEN ABLE TO WALK BETTER WITH WALKER IN THE HALLWAY      DIAGNOSTIC DATA  (LABS, IMAGING, TESTING) - I reviewed patient records, labs, notes, testing and imaging myself where available.  Lab Results  Component Value Date   WBC 5.0 07/28/2020   HGB 13.3 07/28/2020   HCT 39.3 07/28/2020   MCV 93 07/28/2020   PLT 191 07/28/2020      Component Value Date/Time   NA 137 07/28/2020 1115   K 3.9 07/28/2020 1115   CL 96 07/28/2020 1115   CO2 24 07/28/2020 1115   GLUCOSE 86 07/28/2020 1115   GLUCOSE 105 (H) 10/17/2017 1022   BUN 6 (L) 07/28/2020 1115   CREATININE 0.89 07/28/2020 1115   CREATININE 0.86 04/24/2013 0928   CALCIUM 9.2 07/28/2020 1115   PROT 6.4 07/28/2020 1115   ALBUMIN 4.4 07/28/2020 1115   AST 21 07/28/2020 1115   ALT 17 07/28/2020 1115   ALKPHOS 56 07/28/2020 1115   BILITOT 1.3 (H) 07/28/2020 1115   GFRNONAA 91 11/28/2017 1528   GFRNONAA >89 04/24/2013 0928   GFRAA 105 11/28/2017 1528   GFRAA >89 04/24/2013 0928   Lab Results  Component Value Date   CHOL 141 07/28/2020   HDL 57 07/28/2020   LDLCALC 72 07/28/2020   TRIG 59 07/28/2020   CHOLHDL 2.5 07/28/2020   Lab Results  Component Value Date   HGBA1C 4.8 11/08/2013   Lab Results  Component Value Date   VITAMINB12 523 06/03/2016   Lab Results  Component Value Date   TSH 1.310 06/03/2016    11/29/12 MRI cervical  1. At C5-6: disc bulging with moderate spinal stenosis and severe biforaminal stenosis  2. At C3-4, C4-5, C6-7: disc bulging with mild spinal stenosis and severe biforaminal stenosis  3. No cord signal abnormalities.  11/29/12 MRI brain (without) demonstrating:  1. Single right frontal subcortical focus (3mm) of non-specific gliosis.  2. No acute findings.  04/12/13 CT myelogram cervical spine - Central disc herniation at C3-4 with moderate central stenosis. Severe left foraminal narrowing at this level. Moderate central stenosis at C5-6 and C6-7 secondary to posterior disc osteophytes.  06/05/15 MRI cervical spine [I reviewed images myself and agree with  interpretation. -VRP]  1. At C3-4 there is a mild broad-based disc bulge. Left uncovertebral degenerative change and left facet arthropathy resulting in severe left foraminal stenosis. Mild right foraminal stenosis. Mild spinal stenosis. 2. Cervical spine spondylosis as described above.     ASSESSMENT AND PLAN  62 y.o. year old male here with gradual onset, progressive left arm weakness, slowness and stiffness with some numbness. On exam, patient has masked facies, rigidity in the left arm, bradykinesia in the left arm, stooped posture and poor arm swing. Ordered DATscan to eval for parkinson's disease vs drug-induced parkinsonism --> but not affordable at this time   Dx: parkinsonism (likely idiopathic parkinson's disease; drug induced less likely as symptoms have continued to progress in spite of reducing anti-psychotic medications) + mild cervical spinal stenosis and cervical radiculopathy  Parkinson's disease with dyskinesia and fluctuating manifestations    PLAN:  PARKINSON'S DISEASE (established problem, progressive) - continue carb/levo (1 tab 8am, 1 tab 2pm, 1.5tabs 8pm); cannot tolerate higher dose due to hallucinations - continue physical activity / exercises /  PT / OT - continue to use lowest possible dose of anti-psychotic medication (Dr. Delice Bison; psychiatry) - fall precautions; PT exercises; use walker; may need powerchair / wheelchair  SIALORRHEA - continue robinul  Return for pending if symptoms worsen or fail to improve, return to PCP.    Suanne Marker, MD 07/05/2022, 4:45 PM Certified in Neurology, Neurophysiology and Neuroimaging  [facility] [address], [address] [place], [place] [address] [phone]

## 2022-09-08 ENCOUNTER — Ambulatory Visit (INDEPENDENT_AMBULATORY_CARE_PROVIDER_SITE_OTHER): Payer: Medicare Other | Admitting: Urology

## 2022-09-08 ENCOUNTER — Encounter: Payer: Self-pay | Admitting: Urology

## 2022-09-08 VITALS — BP 109/74 | HR 102 | Ht 73.0 in | Wt 152.0 lb

## 2022-09-08 DIAGNOSIS — N138 Other obstructive and reflux uropathy: Secondary | ICD-10-CM | POA: Diagnosis not present

## 2022-09-08 DIAGNOSIS — N401 Enlarged prostate with lower urinary tract symptoms: Secondary | ICD-10-CM

## 2022-09-08 DIAGNOSIS — R339 Retention of urine, unspecified: Secondary | ICD-10-CM

## 2022-09-08 LAB — URINALYSIS, ROUTINE W REFLEX MICROSCOPIC
Bilirubin, UA: NEGATIVE
Glucose, UA: NEGATIVE
Ketones, UA: NEGATIVE
Leukocytes,UA: NEGATIVE
Nitrite, UA: NEGATIVE
Protein,UA: NEGATIVE
RBC, UA: NEGATIVE
Specific Gravity, UA: 1.015 (ref 1.005–1.030)
Urobilinogen, Ur: 1 mg/dL (ref 0.2–1.0)
pH, UA: 7 (ref 5.0–7.5)

## 2022-09-08 LAB — BLADDER SCAN AMB NON-IMAGING: Scan Result: 197

## 2022-09-08 MED ORDER — SILODOSIN 8 MG PO CAPS: 8.0000 mg | ORAL_CAPSULE | Freq: Every day | ORAL | 11 refills | Status: DC

## 2022-09-08 NOTE — Progress Notes (Signed)
09/08/2022 9:52 AM   Mark Benjamin 1960/09/09 [id number]  Referring provider: Mechele Claude, MD [address] [place],  [place] [address]  Followup incomplete emptying   HPI: [name] is a 62yo here for followup for BPH with incomplete emptying. PVR 197cc. He remains on rapaflo 8mg  daily. IPSS 13 QOl 2. Nocturia 1-2x. Urine stream fair. No straining to urinate. 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    Other and unspecified hyperlipidemia    Parkinson disease    Schizophrenia (HCC)     Surgical History: Past Surgical History:  Procedure Laterality Date   COLONOSCOPY  02/13/08   COLONOSCOPY N/A 07/10/2012   Procedure: COLONOSCOPY;  Surgeon: Malissa Hippo, MD;  [facility];  Service: Endoscopy;  Laterality: N/A;  225   CYSTOSCOPY WITH INSERTION OF UROLIFT N/A 10/24/2017   Procedure: CYSTOSCOPY WITH INSERTION OF UROLIFT;  Surgeon: Malen Gauze, MD;  Location: [facility];  Service: Urology;  Laterality: N/A;   UPPER GASTROINTESTINAL ENDOSCOPY  EGD ED   09/09/2010   URETHRAL DILATION  1965    Home Medications:  Allergies as of 09/08/2022       Reactions   Amoxicillin    Dizzy and blurred vision   Erythromycin    Penicillins    Sulfamethoxazole-trimethoprim Other (See Comments)   Extreme weakness/lethorgy   Zocor [simvastatin]         Medication List        Accurate as of September 08, 2022  9:52 AM. If you have any questions, ask your nurse or doctor.          benztropine 1 MG tablet Commonly known as: COGENTIN Take 1 mg by mouth 2 (two) times daily.   carbidopa-levodopa 25-100 MG tablet Commonly known as: SINEMET IR 1 tab at 8am, 1 tab at 2pm, 1.5 tabs at 8pm   divalproex 250 MG DR tablet Commonly known as: DEPAKOTE Take 250 mg by mouth 3  (three) times daily.   docusate sodium 100 MG capsule Commonly known as: COLACE Take 100 mg by mouth 2 (two) times daily.   doxepin 10 MG capsule Commonly known as: SINEQUAN Take 10 mg by mouth daily.   gabapentin 100 MG capsule Commonly known as: NEURONTIN Take 100 mg by mouth at bedtime.   glycopyrrolate 1 MG tablet Commonly known as: ROBINUL Take 1 tablet (1 mg total) by mouth 2 (two) times daily.   multivitamin with minerals Tabs tablet Take 1 tablet by mouth daily.   Polyethyl Glycol-Propyl Glycol 0.4-0.3 % Soln Place 1 drop into both eyes 4 (four) times daily as needed (for dry/irritated eyes (wait 3-5 min between different eye drops)).   risperiDONE 2 MG tablet Commonly known as: RISPERDAL Take 2 mg by mouth 2 (two) times daily.   risperiDONE 0.5 MG tablet Commonly known as: RISPERDAL Take 0.5 mg by mouth at bedtime. Along with the 2 mg tablet to equal 2.5 mg QHS   rosuvastatin 5 MG tablet Commonly known as: Crestor Take 0.5 tablets (2.5 mg total) by mouth every other day. Take 1/2 tablet on Monday, Wednesday and Friday   silodosin 8 MG Caps capsule Commonly known as: RAPAFLO Take 1 capsule (8 mg total) by mouth  daily with breakfast.        Allergies:  Allergies  Allergen Reactions   Amoxicillin     Dizzy and blurred vision   Erythromycin    Penicillins    Sulfamethoxazole-Trimethoprim Other (See Comments)    Extreme weakness/lethorgy   Zocor [Simvastatin]     Family History: Family History  Problem Relation Age of Onset   Heart failure Mother    ALS Father    Hyperlipidemia Sister    Inflammatory bowel disease Paternal Grandfather     Social History:  reports that he has never smoked. He has never used smokeless tobacco. He reports that he does not drink alcohol and does not use drugs.  ROS: All other review of systems were reviewed and are negative except what is noted above in HPI  Physical Exam: BP 109/74   Pulse (!) 102   Ht 6\' 1"   (1.854 m)   Wt 152 lb (68.9 kg)   BMI 20.05 kg/m   Constitutional:  Alert and oriented, No acute distress. HEENT: [place] AT, moist mucus membranes.  Trachea midline, no masses. Cardiovascular: No clubbing, cyanosis, or edema. Respiratory: Normal respiratory effort, no increased work of breathing. GI: Abdomen is soft, nontender, nondistended, no abdominal masses GU: No CVA tenderness.  Lymph: No cervical or inguinal lymphadenopathy. Skin: No rashes, bruises or suspicious lesions. Neurologic: Grossly intact, no focal deficits, moving all 4 extremities. Psychiatric: Normal mood and affect.  Laboratory Data: Lab Results  Component Value Date   WBC 5.0 07/28/2020   HGB 13.3 07/28/2020   HCT 39.3 07/28/2020   MCV 93 07/28/2020   PLT 191 07/28/2020    Lab Results  Component Value Date   CREATININE 0.89 07/28/2020    Lab Results  Component Value Date   PSA 0.80 04/24/2013   PSA 0.7 10/30/2012   PSA 0.73 06/22/2012    No results found for: "TESTOSTERONE"  Lab Results  Component Value Date   HGBA1C 4.8 11/08/2013    Urinalysis    Component Value Date/Time   APPEARANCEUR Clear 09/07/2021 0914   GLUCOSEU Negative 09/07/2021 0914   BILIRUBINUR Negative 09/07/2021 0914   PROTEINUR Negative 09/07/2021 0914   UROBILINOGEN negative 03/19/2015 1138   NITRITE Negative 09/07/2021 0914   LEUKOCYTESUR Negative 09/07/2021 0914    Lab Results  Component Value Date   LABMICR Comment 09/07/2021   WBCUA None seen 12/05/2019   RBCUA None seen 11/28/2017   LABEPIT None seen 12/05/2019   MUCUS occ 03/19/2015   BACTERIA None seen 12/05/2019    Pertinent Imaging:  No results found for this or any previous visit.  No results found for this or any previous visit.  No results found for this or any previous visit.  No results found for this or any previous visit.  No results found for this or any previous visit.  No valid procedures specified. No results found for this or any  previous visit.  No results found for this or any previous visit.   Assessment & Plan:    1. Benign prostatic hyperplasia with urinary obstruction -continue rapalfo 8mg   - BLADDER SCAN AMB NON-IMAGING - Urinalysis, Routine w reflex microscopic  2. Incomplete emptying of bladder Continue rapalfo 8mg    No follow-ups on file.  Wilkie Aye, MD  [facility] [place]

## 2022-09-08 NOTE — Progress Notes (Signed)
post void residual=197 

## 2022-09-08 NOTE — Patient Instructions (Signed)

## 2023-09-06 ENCOUNTER — Telehealth: Payer: Self-pay | Admitting: Urology

## 2023-09-06 NOTE — Telephone Encounter (Signed)
 Tried calling patient with no answer to get clarification on medication refill and unable to leave voiced message due to mailbox is full.

## 2023-09-06 NOTE — Telephone Encounter (Signed)
 Please send his medication refills to his pharmacy on file, patient had to reschedule appt until 12/30/23 [facility] can't bring him to his appt 09/07/23

## 2023-09-07 ENCOUNTER — Ambulatory Visit: Payer: Medicare Other | Admitting: Urology

## 2023-09-30 NOTE — ED Triage Notes (Signed)
 BIBEMS fall today. From [facility]. Abrasion/hematoma noted to right forehead. Patient has fallen several times this week.

## 2023-09-30 NOTE — ED Notes (Signed)
 Report given to Harlene, Med tech.

## 2023-09-30 NOTE — ED Notes (Signed)
 Attempted report to [facility] [place] with no answer.

## 2023-10-01 ENCOUNTER — Other Ambulatory Visit: Payer: Self-pay

## 2023-10-01 ENCOUNTER — Emergency Department (HOSPITAL_COMMUNITY)

## 2023-10-01 ENCOUNTER — Inpatient Hospital Stay (HOSPITAL_COMMUNITY)
Admission: EM | Admit: 2023-10-01 | Discharge: 2023-10-05 | DRG: 682 | Disposition: A | Discharge status: Skilled Nursing Facility | Source: Skilled Nursing Facility | Attending: Family Medicine | Admitting: Family Medicine

## 2023-10-01 ENCOUNTER — Encounter (HOSPITAL_COMMUNITY): Payer: Self-pay | Admitting: Emergency Medicine

## 2023-10-01 DIAGNOSIS — E875 Hyperkalemia: Secondary | ICD-10-CM | POA: Diagnosis not present

## 2023-10-01 DIAGNOSIS — I959 Hypotension, unspecified: Secondary | ICD-10-CM

## 2023-10-01 DIAGNOSIS — F209 Schizophrenia, unspecified: Secondary | ICD-10-CM | POA: Diagnosis present

## 2023-10-01 DIAGNOSIS — G20A1 Parkinson's disease without dyskinesia, without mention of fluctuations: Secondary | ICD-10-CM | POA: Diagnosis present

## 2023-10-01 DIAGNOSIS — Z882 Allergy status to sulfonamides status: Secondary | ICD-10-CM

## 2023-10-01 DIAGNOSIS — Z8249 Family history of ischemic heart disease and other diseases of the circulatory system: Secondary | ICD-10-CM

## 2023-10-01 DIAGNOSIS — E782 Mixed hyperlipidemia: Secondary | ICD-10-CM | POA: Diagnosis present

## 2023-10-01 DIAGNOSIS — F0283 Dementia in other diseases classified elsewhere, unspecified severity, with mood disturbance: Secondary | ICD-10-CM | POA: Diagnosis present

## 2023-10-01 DIAGNOSIS — R4189 Other symptoms and signs involving cognitive functions and awareness: Secondary | ICD-10-CM | POA: Diagnosis present

## 2023-10-01 DIAGNOSIS — R1310 Dysphagia, unspecified: Secondary | ICD-10-CM

## 2023-10-01 DIAGNOSIS — R339 Retention of urine, unspecified: Secondary | ICD-10-CM

## 2023-10-01 DIAGNOSIS — K7581 Nonalcoholic steatohepatitis (NASH): Secondary | ICD-10-CM | POA: Diagnosis present

## 2023-10-01 DIAGNOSIS — G20C Parkinsonism, unspecified: Secondary | ICD-10-CM | POA: Diagnosis present

## 2023-10-01 DIAGNOSIS — Z880 Allergy status to penicillin: Secondary | ICD-10-CM

## 2023-10-01 DIAGNOSIS — N179 Acute kidney failure, unspecified: Principal | ICD-10-CM | POA: Diagnosis present

## 2023-10-01 DIAGNOSIS — R296 Repeated falls: Secondary | ICD-10-CM | POA: Diagnosis present

## 2023-10-01 DIAGNOSIS — N401 Enlarged prostate with lower urinary tract symptoms: Secondary | ICD-10-CM | POA: Diagnosis present

## 2023-10-01 DIAGNOSIS — F0280 Dementia in other diseases classified elsewhere without behavioral disturbance: Secondary | ICD-10-CM

## 2023-10-01 DIAGNOSIS — G9341 Metabolic encephalopathy: Secondary | ICD-10-CM | POA: Diagnosis not present

## 2023-10-01 DIAGNOSIS — Z79899 Other long term (current) drug therapy: Secondary | ICD-10-CM

## 2023-10-01 DIAGNOSIS — N289 Disorder of kidney and ureter, unspecified: Principal | ICD-10-CM

## 2023-10-01 DIAGNOSIS — Z888 Allergy status to other drugs, medicaments and biological substances status: Secondary | ICD-10-CM

## 2023-10-01 DIAGNOSIS — E860 Dehydration: Secondary | ICD-10-CM | POA: Diagnosis present

## 2023-10-01 DIAGNOSIS — Z83438 Family history of other disorder of lipoprotein metabolism and other lipidemia: Secondary | ICD-10-CM

## 2023-10-01 DIAGNOSIS — I10 Essential (primary) hypertension: Secondary | ICD-10-CM | POA: Diagnosis present

## 2023-10-01 LAB — COMPREHENSIVE METABOLIC PANEL WITH GFR
ALT: 8 U/L (ref 0–44)
AST: 29 U/L (ref 15–41)
Albumin: 3.7 g/dL (ref 3.5–5.0)
Alkaline Phosphatase: 52 U/L (ref 38–126)
Anion gap: 14 (ref 5–15)
BUN: 52 mg/dL — ABNORMAL HIGH (ref 8–23)
CO2: 26 mmol/L (ref 22–32)
Calcium: 9.5 mg/dL (ref 8.9–10.3)
Chloride: 101 mmol/L (ref 98–111)
Creatinine, Ser: 3.95 mg/dL — ABNORMAL HIGH (ref 0.61–1.24)
GFR, Estimated: 16 mL/min — ABNORMAL LOW (ref >60)
Glucose, Bld: 105 mg/dL — ABNORMAL HIGH (ref 70–99)
Potassium: 5.2 mmol/L — ABNORMAL HIGH (ref 3.5–5.1)
Sodium: 141 mmol/L (ref 135–145)
Total Bilirubin: 0.7 mg/dL (ref 0.0–1.2)
Total Protein: 7.3 g/dL (ref 6.5–8.1)

## 2023-10-01 LAB — URINALYSIS, ROUTINE W REFLEX MICROSCOPIC
Bilirubin Urine: NEGATIVE
Glucose, UA: NEGATIVE mg/dL
Hgb urine dipstick: NEGATIVE
Ketones, ur: NEGATIVE mg/dL
Leukocytes,Ua: NEGATIVE
Nitrite: NEGATIVE
Protein, ur: NEGATIVE mg/dL
Specific Gravity, Urine: 1.009 (ref 1.005–1.030)
pH: 5 (ref 5.0–8.0)

## 2023-10-01 LAB — CK: Total CK: 109 U/L (ref 49–397)

## 2023-10-01 LAB — CBC WITH DIFFERENTIAL/PLATELET
Abs Immature Granulocytes: 0.04 K/uL (ref 0.00–0.07)
Basophils Absolute: 0 K/uL (ref 0.0–0.1)
Basophils Relative: 0 %
Eosinophils Absolute: 0 K/uL (ref 0.0–0.5)
Eosinophils Relative: 0 %
HCT: 38.8 % — ABNORMAL LOW (ref 39.0–52.0)
Hemoglobin: 12.8 g/dL — ABNORMAL LOW (ref 13.0–17.0)
Immature Granulocytes: 0 %
Lymphocytes Relative: 5 %
Lymphs Abs: 0.6 K/uL — ABNORMAL LOW (ref 0.7–4.0)
MCH: 33.2 pg (ref 26.0–34.0)
MCHC: 33 g/dL (ref 30.0–36.0)
MCV: 100.8 fL — ABNORMAL HIGH (ref 80.0–100.0)
Monocytes Absolute: 1.1 K/uL — ABNORMAL HIGH (ref 0.1–1.0)
Monocytes Relative: 11 %
Neutro Abs: 8.9 K/uL — ABNORMAL HIGH (ref 1.7–7.7)
Neutrophils Relative %: 84 %
Platelets: 146 K/uL — ABNORMAL LOW (ref 150–400)
RBC: 3.85 MIL/uL — ABNORMAL LOW (ref 4.22–5.81)
RDW: 12.6 % (ref 11.5–15.5)
WBC: 10.7 K/uL — ABNORMAL HIGH (ref 4.0–10.5)
nRBC: 0 % (ref 0.0–0.2)

## 2023-10-01 MED ORDER — CARBIDOPA-LEVODOPA 25-100 MG PO TABS
1.5000 | ORAL_TABLET | ORAL | Status: DC
  Administered 2023-10-02 – 2023-10-05 (×4): 1.5 via ORAL
  Filled 2023-10-01 (×4): qty 2

## 2023-10-01 MED ORDER — TAMSULOSIN HCL 0.4 MG PO CAPS
0.4000 mg | ORAL_CAPSULE | Freq: Every day | ORAL | Status: DC
  Administered 2023-10-01 – 2023-10-05 (×5): 0.4 mg via ORAL
  Filled 2023-10-01 (×5): qty 1

## 2023-10-01 MED ORDER — HEPARIN SODIUM (PORCINE) 5000 UNIT/ML IJ SOLN
5000.0000 [IU] | Freq: Three times a day (TID) | INTRAMUSCULAR | Status: DC
  Administered 2023-10-01 – 2023-10-05 (×13): 5000 [IU] via SUBCUTANEOUS
  Filled 2023-10-01 (×13): qty 1

## 2023-10-01 MED ORDER — SODIUM CHLORIDE 0.9 % IV BOLUS
1000.0000 mL | Freq: Once | INTRAVENOUS | Status: AC
  Administered 2023-10-01: 1000 mL via INTRAVENOUS

## 2023-10-01 MED ORDER — CARBIDOPA-LEVODOPA 25-100 MG PO TABS
1.0000 | ORAL_TABLET | ORAL | Status: DC
  Administered 2023-10-02 – 2023-10-05 (×4): 1 via ORAL
  Filled 2023-10-01 (×4): qty 1

## 2023-10-01 MED ORDER — ROSUVASTATIN CALCIUM 5 MG PO TABS
2.5000 mg | ORAL_TABLET | ORAL | Status: DC
  Administered 2023-10-03 – 2023-10-05 (×2): 2.5 mg via ORAL
  Filled 2023-10-01 (×2): qty 0.5

## 2023-10-01 MED ORDER — DIVALPROEX SODIUM 250 MG PO DR TAB
250.0000 mg | DELAYED_RELEASE_TABLET | Freq: Three times a day (TID) | ORAL | Status: DC
  Administered 2023-10-01 – 2023-10-05 (×13): 250 mg via ORAL
  Filled 2023-10-01 (×13): qty 1

## 2023-10-01 MED ORDER — ACETAMINOPHEN 650 MG RE SUPP: 650.0000 mg | Freq: Four times a day (QID) | RECTAL | Status: DC | PRN

## 2023-10-01 MED ORDER — GABAPENTIN 100 MG PO CAPS
100.0000 mg | ORAL_CAPSULE | Freq: Every day | ORAL | Status: DC
  Administered 2023-10-01 – 2023-10-04 (×4): 100 mg via ORAL
  Filled 2023-10-01 (×4): qty 1

## 2023-10-01 MED ORDER — CARBIDOPA-LEVODOPA 25-100 MG PO TABS
1.0000 | ORAL_TABLET | ORAL | Status: DC
  Administered 2023-10-01 – 2023-10-04 (×4): 1 via ORAL
  Filled 2023-10-01 (×4): qty 1

## 2023-10-01 MED ORDER — DOCUSATE SODIUM 100 MG PO CAPS
100.0000 mg | ORAL_CAPSULE | Freq: Two times a day (BID) | ORAL | Status: DC
  Administered 2023-10-01 – 2023-10-05 (×8): 100 mg via ORAL
  Filled 2023-10-01 (×8): qty 1

## 2023-10-01 MED ORDER — RISPERIDONE 0.5 MG PO TABS
2.5000 mg | ORAL_TABLET | Freq: Every day | ORAL | Status: DC
  Administered 2023-10-01 – 2023-10-04 (×4): 2.5 mg via ORAL
  Filled 2023-10-01 (×4): qty 5

## 2023-10-01 MED ORDER — ONDANSETRON HCL 4 MG/2ML IJ SOLN
4.0000 mg | Freq: Four times a day (QID) | INTRAMUSCULAR | Status: DC | PRN
Start: 2023-10-01 — End: 2023-10-05

## 2023-10-01 MED ORDER — SODIUM ZIRCONIUM CYCLOSILICATE 5 G PO PACK
10.0000 g | PACK | Freq: Once | ORAL | Status: AC
  Administered 2023-10-01: 10 g via ORAL
  Filled 2023-10-01: qty 2

## 2023-10-01 MED ORDER — ACETAMINOPHEN 325 MG PO TABS
650.0000 mg | ORAL_TABLET | Freq: Four times a day (QID) | ORAL | Status: DC | PRN
  Administered 2023-10-02 – 2023-10-04 (×2): 650 mg via ORAL
  Filled 2023-10-01 (×2): qty 2

## 2023-10-01 MED ORDER — POLYVINYL ALCOHOL 1.4 % OP SOLN: 1.0000 [drp] | Freq: Four times a day (QID) | OPHTHALMIC | Status: DC | PRN

## 2023-10-01 MED ORDER — GLYCOPYRROLATE 1 MG PO TABS
1.0000 mg | ORAL_TABLET | Freq: Two times a day (BID) | ORAL | Status: DC
  Administered 2023-10-01 – 2023-10-05 (×8): 1 mg via ORAL
  Filled 2023-10-01 (×8): qty 1

## 2023-10-01 MED ORDER — ONDANSETRON HCL 4 MG PO TABS: 4.0000 mg | ORAL_TABLET | Freq: Four times a day (QID) | ORAL | Status: DC | PRN

## 2023-10-01 MED ORDER — SODIUM CHLORIDE 0.9 % IV SOLN: INTRAVENOUS | Status: AC

## 2023-10-01 NOTE — Hospital Course (Addendum)
 63 year old male with a history of Parkinson's disease, hypertension, hyperlipidemia, BPH, megaloblastic anemia, NASH presenting with hypotension from ALF, [facility].  Unfortunately, the patient is unable to provide any significant history secondary to his cognitive impairment.  Notably, the patient was in the emergency department at [facility] on 09/30/2023 after sustaining a fall.  Workup including CT of the brain was negative for any acute findings.  There was a mild right frontal scalp soft tissue edema/contusion.  The patient was discharged back to his facility in stable condition.  The patient was also in the ED at [facility] on 09/28/23 with a fall.  CT of the brain was negative.  The patient was discharged back to his facility.  Apparently on the morning of 10/01/2023, the patient was found to be hypotensive at his facility.  It is unclear exactly his blood pressure, but there was a written note with vital signs accompanying his paperwork showed a blood pressure of 97/75 with oxygen saturation 92% room air.  The patient was afebrile.  He denies any chest pain shortness of breath or abdominal pain.  Remainder review of systems unobtainable. Notably, review of his MAR from his facility shows that the patient recently finished a 10-day course of ciprofloxacin  on 09/29/2023.  In speaking with the patient's sister, she states that the patient has been more somnolent this past week with decreased oral intake.  She notes that he has had a gradual cognitive and functional decline over the last couple months. In the ED, the patient was afebrile hemodynamically stable with oxygen saturation 98% room air.  WBC 10.7, hemoglobin 12.8, platelets 146.  Sodium 141, potassium 5.2, bicarbonate 26, serum creatinine 3.95.  LFTs were unremarkable.  EKG showed sinus rhythm with no ST/T wave changes.  CT of the abdomen and pelvis showed a Foley in the bladder with enlarged prostate.  There was no renal calculus.  There was no obstructive  uropathy.  The patient was started IV fluids and admitted for further evaluation and treatment of his altered mental status and AKI.

## 2023-10-01 NOTE — ED Provider Notes (Signed)
 [place] EMERGENCY DEPARTMENT AT [facility] Provider Note  CSN: [id number] Arrival date & time: 10/01/23 9086  Chief Complaint(s) Hypotension  HPI Mark Benjamin is a 63 y.o. male with past medical history as below, significant for megaloblastic anemia, parkinson's disease, schizophrenia, dementia who presents to the ED with complaint of hypotension.   Patient presents from SNF, [facility] in [place].  Reports of hypotension-this morning per SNF.  He was seen at [facility] yesterday and again on 7/9 for frequent falls.  He had CT head yesterday which was stable.  He has frequent falls associated with Parkinson's disease, he is supposed to be in a wheelchair but this is pending discussion with pcp and neurologist w/ family per recent documentation.   On my evaluation patient denies any acute complaints.  BP stable on arrival to the ER. Of note patient did have some various pills stuck onto his shirt.   Past Medical History Past Medical History:  Diagnosis Date   Cataract    Colon polyps    Dysphagia    Essential hypertension, benign    Fatty liver    Hyperplasia of prostate    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    Other and unspecified hyperlipidemia    Parkinson disease (HCC)    Schizophrenia (HCC)    Patient Active Problem List   Diagnosis Date Noted   Urinary urgency 03/07/2020   Incomplete emptying of bladder 12/05/2019   Cervical myelopathy (HCC) 08/24/2019   Elevated PSA 02/10/2017   Spinal stenosis of cervical region 02/08/2017   Parkinson disease (HCC) 09/05/2015   Vitamin D  deficiency 09/05/2015   Metabolic syndrome 04/02/2013   Hypertension 04/02/2013   Hyperlipidemia 04/02/2013   Benign prostatic hyperplasia with urinary obstruction 04/02/2013   Left arm weakness 11/21/2012   Parkinsonism (HCC) 11/21/2012   Neck pain 11/21/2012   Colon adenomas 06/29/2012   Rectal mass 06/29/2012   Schizophrenia (HCC) 06/22/2012    Dysphagia 06/22/2011   Eosinophilic esophagitis 06/22/2011   Home Medication(s) Prior to Admission medications   Medication Sig Start Date End Date Taking? Authorizing Provider  benztropine (COGENTIN) 1 MG tablet Take 1 mg by mouth 2 (two) times daily.  03/13/14   [provider]  carbidopa -levodopa  (SINEMET  IR) 25-100 MG tablet 1 tab at 8am, 1 tab at 2pm, 1.5 tabs at 8pm 06/25/20   Penumalli, Vikram R, MD  divalproex  (DEPAKOTE ) 250 MG DR tablet Take 250 mg by mouth 3 (three) times daily. 03/13/14   [provider]  docusate sodium  (COLACE) 100 MG capsule Take 100 mg by mouth 2 (two) times daily.    [provider]  doxepin (SINEQUAN) 10 MG capsule Take 10 mg by mouth daily. 06/24/21   [provider]  gabapentin  (NEURONTIN ) 100 MG capsule Take 100 mg by mouth at bedtime. 07/09/21   [provider]  glycopyrrolate  (ROBINUL ) 1 MG tablet Take 1 tablet (1 mg total) by mouth 2 (two) times daily. 07/22/21   Penumalli, Vikram R, MD  Multiple Vitamin (MULTIVITAMIN WITH MINERALS) TABS tablet Take 1 tablet by mouth daily.    [provider]  Polyethyl Glycol-Propyl Glycol 0.4-0.3 % SOLN Place 1 drop into both eyes 4 (four) times daily as needed (for dry/irritated eyes (wait 3-5 min between different eye drops)).     [provider]  risperiDONE  (RISPERDAL ) 0.5 MG tablet Take 0.5 mg by mouth at bedtime. Along with the 2 mg tablet to equal 2.5 mg QHS 06/22/22  [provider]  risperiDONE  (RISPERDAL ) 2 MG tablet Take 2 mg by mouth 2 (two) times daily.    [provider]  rosuvastatin  (CRESTOR ) 5 MG tablet Take 0.5 tablets (2.5 mg total) by mouth every other day. Take 1/2 tablet on Monday, Wednesday and Friday 11/30/17   Georgina Nancyann ORN, MD  silodosin  (RAPAFLO ) 8 MG CAPS capsule Take 1 capsule (8 mg total) by mouth daily with breakfast. 09/08/22   Sherrilee Belvie CROME, MD                                                                                                                                     Past Surgical History Past Surgical History:  Procedure Laterality Date   COLONOSCOPY  02/13/08   COLONOSCOPY N/A 07/10/2012   Procedure: COLONOSCOPY;  Surgeon: Claudis RAYMOND Rivet, MD;  Location: [facility];  Service: Endoscopy;  Laterality: N/A;  225   CYSTOSCOPY WITH INSERTION OF UROLIFT N/A 10/24/2017   Procedure: CYSTOSCOPY WITH INSERTION OF UROLIFT;  Surgeon: Sherrilee Belvie CROME, MD;  Location: [facility] ORS;  Service: Urology;  Laterality: N/A;   UPPER GASTROINTESTINAL ENDOSCOPY  EGD ED   09/09/2010   URETHRAL DILATION  1965   Family History Family History  Problem Relation Age of Onset   Heart failure Mother    ALS Father    Hyperlipidemia Sister    Inflammatory bowel disease Paternal Grandfather     Social History Social History   Tobacco Use   Smoking status: Never   Smokeless tobacco: Never  Vaping Use   Vaping status: Never Used  Substance Use Topics   Alcohol  use: No   Drug use: No   Allergies Amoxicillin, Erythromycin, Penicillins, Sulfamethoxazole -trimethoprim , and Zocor [simvastatin]  Review of Systems A thorough review of systems was obtained and all systems are negative except as noted in the HPI and PMH.   Physical Exam Vital Signs  I have reviewed the triage vital signs BP 126/67   Pulse 60   Temp 97.7 F (36.5 C) (Oral)   Resp 12   Ht 6' 1 (1.854 m)   Wt 68 kg   SpO2 98%   BMI 19.78 kg/m  Physical Exam Vitals and nursing note reviewed.  Constitutional:      General: He is not in acute distress.    Appearance: Normal appearance. He is well-developed. He is not ill-appearing.     Comments: frail  HENT:     Head: Normocephalic and atraumatic. No right periorbital erythema or left periorbital erythema.     Right Ear: External ear normal.     Left Ear: External ear normal.     Nose: Nose normal.     Mouth/Throat:     Mouth: Mucous membranes are dry.  Eyes:     General: No scleral  icterus.       Right eye: No discharge.        Left  eye: No discharge.  Cardiovascular:     Rate and Rhythm: Normal rate.  Pulmonary:     Effort: Pulmonary effort is normal. No respiratory distress.     Breath sounds: No stridor.  Abdominal:     General: Abdomen is flat. There is no distension.     Palpations: Abdomen is soft.     Tenderness: There is no guarding.  Musculoskeletal:        General: No deformity.     Cervical back: No rigidity.  Skin:    General: Skin is warm and dry.     Coloration: Skin is not cyanotic, jaundiced or pale.  Neurological:     Mental Status: He is alert and oriented to person, place, and time.     GCS: GCS eye subscore is 4. GCS verbal subscore is 5. GCS motor subscore is 6.     Comments: He is Aox3, very soft spoken  Psychiatric:        Speech: Speech normal.        Behavior: Behavior normal. Behavior is cooperative.     ED Results and Treatments Labs (all labs ordered are listed, but only abnormal results are displayed) Labs Reviewed  CBC WITH DIFFERENTIAL/PLATELET - Abnormal; Notable for the following components:      Result Value   WBC 10.7 (*)    RBC 3.85 (*)    Hemoglobin 12.8 (*)    HCT 38.8 (*)    MCV 100.8 (*)    Platelets 146 (*)    Neutro Abs 8.9 (*)    Lymphs Abs 0.6 (*)    Monocytes Absolute 1.1 (*)    All other components within normal limits  COMPREHENSIVE METABOLIC PANEL WITH GFR - Abnormal; Notable for the following components:   Potassium 5.2 (*)    Glucose, Bld 105 (*)    BUN 52 (*)    Creatinine, Ser 3.95 (*)    GFR, Estimated 16 (*)    All other components within normal limits  URINALYSIS, ROUTINE W REFLEX MICROSCOPIC - Abnormal; Notable for the following components:   Color, Urine STRAW (*)    All other components within normal limits                                                                                                                          Radiology CT ABDOMEN PELVIS WO CONTRAST Result Date:  10/01/2023 CLINICAL DATA:  Acute kidney failure. Multiple falls with weakness and hypotension. EXAM: CT ABDOMEN AND PELVIS WITHOUT CONTRAST TECHNIQUE: Multidetector CT imaging of the abdomen and pelvis was performed following the standard protocol without IV contrast. RADIATION DOSE REDUCTION: This exam was performed according to the departmental dose-optimization program which includes automated exposure control, adjustment of the mA and/or kV according to patient size and/or use of iterative reconstruction technique. COMPARISON:  None Available. FINDINGS: Lower chest: No acute abnormality. Hepatobiliary: No focal liver abnormality is seen. No gallstones, gallbladder wall thickening, or biliary dilatation. Pancreas: Unremarkable.  No pancreatic ductal dilatation or surrounding inflammatory changes. Spleen: Normal in size without focal abnormality. Adrenals/Urinary Tract: The adrenal glands are within normal limits. No renal calculus or obstructive uropathy bilaterally. Evaluation of the mid to distal ureters is limited due to overlapping structures. A Foley catheter is noted in the urinary bladder. Stomach/Bowel: Stomach is within normal limits. Appendix is not seen. No evidence of bowel wall thickening, distention, or inflammatory changes. No free air or pneumatosis. Vascular/Lymphatic: Aortic atherosclerosis. No enlarged abdominal or pelvic lymph nodes. Reproductive: Prostate gland is enlarged. Other: No abdominopelvic ascites. Musculoskeletal: Degenerative changes are present in the thoracolumbar spine. No acute osseous abnormality is seen. IMPRESSION: 1. No renal calculus or obstructive uropathy bilaterally. 2. Foley catheter in the urinary bladder. 3. Enlarged prostate gland. 4. Aortic atherosclerosis. Electronically Signed   By: Leita Birmingham M.D.   On: 10/01/2023 12:41   DG Chest Portable 1 View Result Date: 10/01/2023 CLINICAL DATA:  hypotension EXAM: PORTABLE CHEST 1 VIEW COMPARISON:  November 10, 2022  FINDINGS: Evaluation is limited by rotation. The cardiomediastinal silhouette is unchanged in contour.Atherosclerotic calcifications. No pleural effusion. No pneumothorax. No acute pleuroparenchymal abnormality. IMPRESSION: No acute cardiopulmonary abnormality. Electronically Signed   By: Corean Salter M.D.   On: 10/01/2023 11:21    Pertinent labs & imaging results that were available during my care of the patient were reviewed by me and considered in my medical decision making (see MDM for details).  Medications Ordered in ED Medications  0.9 %  sodium chloride  infusion (has no administration in time range)  sodium chloride  0.9 % bolus 1,000 mL (0 mLs Intravenous Stopped 10/01/23 1231)  sodium zirconium cyclosilicate  (LOKELMA ) packet 10 g (10 g Oral Given 10/01/23 1229)  sodium chloride  0.9 % bolus 1,000 mL (1,000 mLs Intravenous New Bag/Given 10/01/23 1226)                                                                                                                                     Procedures .Critical Care  Performed by: Elnor Jayson LABOR, DO Authorized by: Elnor Jayson LABOR, DO   Critical care provider statement:    Critical care time (minutes):  45   Critical care time was exclusive of:  Separately billable procedures and treating other patients   Critical care was necessary to treat or prevent imminent or life-threatening deterioration of the following conditions:  Renal failure   Critical care was time spent personally by me on the following activities:  Development of treatment plan with patient or surrogate, discussions with consultants, evaluation of patient's response to treatment, examination of patient, ordering and review of laboratory studies, ordering and review of radiographic studies, ordering and performing treatments and interventions, pulse oximetry, re-evaluation of patient's condition, review of old charts and obtaining history from patient or surrogate   Care discussed  with: admitting provider     (including critical care time)  Medical Decision Making /  ED Course    Medical Decision Making:    ZAE KIRTZ is a 63 y.o. male with past medical history as below, significant for megaloblastic anemia, parkinson's disease, schizophrenia, dementia who presents to the ED with complaint of hypotension. . The complaint involves an extensive differential diagnosis and also carries with it a high risk of complications and morbidity.  Serious etiology was considered. Ddx includes but is not limited to: dehydration, infection, concussion, medication effect, dementia progression, etc  Complete initial physical exam performed, notably the patient was in no distress, no hypoxia.    Reviewed and confirmed nursing documentation for past medical history, family history, social history.  Vital signs reviewed.    Hypotension  Renal insufficiency> -improved on arrival -pt appears very dry, give IVF -AKI likely 2/2 dehydration given elevated BUN and exam, continue IVF, spoke with renal Dr Geralynn who agrees w/ rehydration, okay to stay here  Urinary retention> ->500 mL on bladder scan -place foley -also likely compounding his aki  Hyperkalemia> -give lokelma  / fluids, no EKG changes  Dementia Frequent falls> -likely factor in his frequent falls, would likely benefit from wheelchair, does not seem to be able to ambulate safely      Clinical Course as of 10/01/23 1326  Sat Oct 01, 2023  0952 500 mL urine in bladder, pt w/ dementia, retaining urine, will place foley  [SG]  1137 Creatinine(!): 3.95 New, BUN elev, he appears very dry, likely pre-renal. Cr 8/24 was 0.79  [SG]  1305 Spoke w/ dr geralynn, recommend continued rehydration, ok to stay here [SG]    Clinical Course User Index [SG] Elnor Jayson LABOR, DO      Admit hospitalist               Additional history obtained: -Additional history obtained from na -External records from outside  source obtained and reviewed including: Chart review including previous notes, labs, imaging, consultation notes including  Prior er eval    Lab Tests: -I ordered, reviewed, and interpreted labs.   The pertinent results include:   Labs Reviewed  CBC WITH DIFFERENTIAL/PLATELET - Abnormal; Notable for the following components:      Result Value   WBC 10.7 (*)    RBC 3.85 (*)    Hemoglobin 12.8 (*)    HCT 38.8 (*)    MCV 100.8 (*)    Platelets 146 (*)    Neutro Abs 8.9 (*)    Lymphs Abs 0.6 (*)    Monocytes Absolute 1.1 (*)    All other components within normal limits  COMPREHENSIVE METABOLIC PANEL WITH GFR - Abnormal; Notable for the following components:   Potassium 5.2 (*)    Glucose, Bld 105 (*)    BUN 52 (*)    Creatinine, Ser 3.95 (*)    GFR, Estimated 16 (*)    All other components within normal limits  URINALYSIS, ROUTINE W REFLEX MICROSCOPIC - Abnormal; Notable for the following components:   Color, Urine STRAW (*)    All other components within normal limits    Notable for as above, aki  EKG   EKG Interpretation Date/Time:  Saturday October 01 2023 09:23:54 EDT Ventricular Rate:  75 PR Interval:  138 QRS Duration:  91 QT Interval:  406 QTC Calculation: 454 R Axis:   92  Text Interpretation: Sinus rhythm RAE, consider biatrial enlargement Right axis deviation Confirmed by Elnor Jayson ([id number]) on 10/01/2023 9:30:03 AM         Imaging Studies ordered:  I ordered imaging studies including ct abd w/o, cxr I independently visualized the following imaging with scope of interpretation limited to determining acute life threatening conditions related to emergency care; findings noted above I agree with the radiologist interpretation If any imaging was obtained with contrast I closely monitored patient for any possible adverse reaction a/w contrast administration in the emergency department   Medicines ordered and prescription drug management: Meds ordered this  encounter  Medications   sodium chloride  0.9 % bolus 1,000 mL   sodium zirconium cyclosilicate  (LOKELMA ) packet 10 g   sodium chloride  0.9 % bolus 1,000 mL   0.9 %  sodium chloride  infusion    -I have reviewed the patients home medicines and have made adjustments as needed   Consultations Obtained: I requested consultation with the renal, hospitalist,  and discussed lab and imaging findings as well as pertinent plan  Cardiac Monitoring: Continuous pulse oximetry interpreted by myself, 99% on ra.    Social Determinants of Health:  Diagnosis or treatment significantly limited by social determinants of health: dementia   Reevaluation: After the interventions noted above, I reevaluated the patient and found that they have improved  Co morbidities that complicate the patient evaluation  Past Medical History:  Diagnosis Date   Cataract    Colon polyps    Dysphagia    Essential hypertension, benign    Fatty liver    Hyperplasia of prostate    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    Other and unspecified hyperlipidemia    Parkinson disease (HCC)    Schizophrenia (HCC)       Dispostion: Disposition decision including need for hospitalization was considered, and patient admitted to the hospital.    Final Clinical Impression(s) / ED Diagnoses Final diagnoses:  Renal insufficiency  Hypotension, unspecified hypotension type  Dementia due to Parkinson's disease, unspecified dementia severity, unspecified whether behavioral, psychotic, or mood disturbance or anxiety (HCC)  Hyperkalemia  Urinary retention        Elnor Jayson LABOR, DO 10/01/23 1326

## 2023-10-01 NOTE — ED Triage Notes (Signed)
 Pt arrives via EMS from [facility] of [place] with reports of weakness and hypotension. Pt seen at [facility] yesterday for a fall. Facility reports he has fallen multiple times this week and needs PT and higher level of care. EMS reports he had yellow DNR form yesterday when they transported to [facility] but was not given it today.

## 2023-10-01 NOTE — H&P (Addendum)
 History and Physical    Patient: Mark Benjamin FMW:[id number] DOB: Sep 16, 1960 DOA: 10/01/2023 DOS: the patient was seen and examined on 10/01/2023 PCP: Pia Kerney SQUIBB, MD  Patient coming from: ALF/ILF  Chief Complaint:  Chief Complaint  Patient presents with   Hypotension   HPI: Mark Benjamin is a 63 year old male with a history of Parkinson's disease, hypertension, hyperlipidemia, BPH, megaloblastic anemia, NASH presenting with hypotension from ALF, [employer].  Unfortunately, the patient is unable to provide any significant history secondary to his cognitive impairment.  Notably, the patient was in the emergency department at [facility] on 09/30/2023 after sustaining a fall.  Workup including CT of the brain was negative for any acute findings.  There was a mild right frontal scalp soft tissue edema/contusion.  The patient was discharged back to his facility in stable condition.  The patient was also in the ED at [facility] on 09/28/23 with a fall.  CT of the brain was negative.  The patient was discharged back to his facility.  Apparently on the morning of 10/01/2023, the patient was found to be hypotensive at his facility.  It is unclear exactly his blood pressure, but there was a written note with vital signs accompanying his paperwork showed a blood pressure of 97/75 with oxygen saturation 92% room air.  The patient was afebrile.  He denies any chest pain shortness of breath or abdominal pain.  Remainder review of systems unobtainable. Notably, review of his MAR from his facility shows that the patient recently finished a 10-day course of ciprofloxacin  on 09/29/2023.  In speaking with the patient's sister, she states that the patient has been more somnolent this past week with decreased oral intake.  She notes that he has had a gradual cognitive and functional decline over the last couple months. In the ED, the patient was afebrile hemodynamically stable with oxygen saturation 98% room air.  WBC 10.7,  hemoglobin 12.8, platelets 146.  Sodium 141, potassium 5.2, bicarbonate 26, serum creatinine 3.95.  LFTs were unremarkable.  EKG showed sinus rhythm with no ST/T wave changes.  CT of the abdomen and pelvis showed a Foley in the bladder with enlarged prostate.  There was no renal calculus.  There was no obstructive uropathy.  The patient was started IV fluids and admitted for further evaluation and treatment of his altered mental status and AKI.  Review of Systems: As mentioned in the history of present illness. All other systems reviewed and are negative. Past Medical History:  Diagnosis Date   Cataract    Colon polyps    Dysphagia    Essential hypertension, benign    Fatty liver    Hyperplasia of prostate    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    Other and unspecified hyperlipidemia    Parkinson disease (HCC)    Schizophrenia (HCC)    Past Surgical History:  Procedure Laterality Date   COLONOSCOPY  02/13/08   COLONOSCOPY N/A 07/10/2012   Procedure: COLONOSCOPY;  Surgeon: Claudis RAYMOND Rivet, MD;  Location: [facility];  Service: Endoscopy;  Laterality: N/A;  225   CYSTOSCOPY WITH INSERTION OF UROLIFT N/A 10/24/2017   Procedure: CYSTOSCOPY WITH INSERTION OF UROLIFT;  Surgeon: Sherrilee Belvie CROME, MD;  Location: [facility];  Service: Urology;  Laterality: N/A;   UPPER GASTROINTESTINAL ENDOSCOPY  EGD ED   09/09/2010   URETHRAL DILATION  1965   Social History:  reports that he has never smoked. He has never used smokeless tobacco.  He reports that he does not drink alcohol  and does not use drugs.  Allergies  Allergen Reactions   Amoxicillin     Dizzy and blurred vision   Erythromycin    Penicillins    Sulfamethoxazole -Trimethoprim  Other (See Comments)    Extreme weakness/lethorgy   Zocor [Simvastatin]     Family History  Problem Relation Age of Onset   Heart failure Mother    ALS Father    Hyperlipidemia Sister    Inflammatory bowel disease Paternal Grandfather      Prior to Admission medications   Medication Sig Start Date End Date Taking? Authorizing Provider  benztropine (COGENTIN) 1 MG tablet Take 1 mg by mouth 2 (two) times daily.  03/13/14   [provider]  carbidopa -levodopa  (SINEMET  IR) 25-100 MG tablet 1 tab at 8am, 1 tab at 2pm, 1.5 tabs at 8pm 06/25/20   Penumalli, Vikram R, MD  divalproex  (DEPAKOTE ) 250 MG DR tablet Take 250 mg by mouth 3 (three) times daily. 03/13/14   [provider]  docusate sodium  (COLACE) 100 MG capsule Take 100 mg by mouth 2 (two) times daily.    [provider]  doxepin (SINEQUAN) 10 MG capsule Take 10 mg by mouth daily. 06/24/21   [provider]  gabapentin  (NEURONTIN ) 100 MG capsule Take 100 mg by mouth at bedtime. 07/09/21   [provider]  glycopyrrolate  (ROBINUL ) 1 MG tablet Take 1 tablet (1 mg total) by mouth 2 (two) times daily. 07/22/21   Penumalli, Vikram R, MD  Multiple Vitamin (MULTIVITAMIN WITH MINERALS) TABS tablet Take 1 tablet by mouth daily.    [provider]  Polyethyl Glycol-Propyl Glycol 0.4-0.3 % SOLN Place 1 drop into both eyes 4 (four) times daily as needed (for dry/irritated eyes (wait 3-5 min between different eye drops)).     [provider]  risperiDONE  (RISPERDAL ) 0.5 MG tablet Take 0.5 mg by mouth at bedtime. Along with the 2 mg tablet to equal 2.5 mg QHS 06/22/22   [provider]  risperiDONE  (RISPERDAL ) 2 MG tablet Take 2 mg by mouth 2 (two) times daily.    [provider]  rosuvastatin  (CRESTOR ) 5 MG tablet Take 0.5 tablets (2.5 mg total) by mouth every other day. Take 1/2 tablet on Monday, Wednesday and Friday 11/30/17   Georgina Nancyann ORN, MD  silodosin  (RAPAFLO ) 8 MG CAPS capsule Take 1 capsule (8 mg total) by mouth daily with breakfast. 09/08/22   Sherrilee Belvie CROME, MD    Physical Exam: Vitals:   10/01/23 1200 10/01/23 1230 10/01/23 1300 10/01/23 1348  BP: 128/67 138/75 126/67   Pulse: 62 74 60   Resp: 13  11 12    Temp:    97.6 F (36.4 C)  TempSrc:    Oral  SpO2: 98% 98% 98%   Weight:      Height:       GENERAL:  A&O x 1, NAD, well developed, cooperative, follows commands HEENT: [place]/AT, No thrush, No icterus, No oral ulcers Neck:  No neck mass, No meningismus, soft, supple CV: RRR, no S3, no S4, no rub, no JVD Lungs: Bibasilar Rales.  No wheezing Abd: soft/NT +BS, nondistended Ext: No edema, no lymphangitis, no cyanosis, no rashes Neuro:  CN II-XII intact, strength 3/5 in RUE, RLE, strength 3/5 LUE, LLE; sensation intact bilateral; no dysmetria; babinski equivocal  Data Reviewed: Data reviewed above in the history Assessment and Plan: Acute metabolic encephalopathy -At baseline, patient needs assistance with his ADLs and is pleasantly  confused - He is more somnolent and less interactive, falling asleep without any stimuli - Secondary to AKI - B12 - TSH - 7/9 and 7/11 CT brain neg - Folic acid - UA negative for pyuria - Holding Risperdal  temporarily  Parkinson's disease - Continue Sinemet   Urinary retention - Foley catheter placed in the ED - Continue Rapaflo   Essential hypertension - He does not appear to be on any antihypertensive medications  Mood disorder - Continue Depakote  - Holding doxepin temporarily  Mixed hyperlipidemia - Continue Crestor       Advance Care Planning: FULL  Consults: none  Family Communication: sister 7/12  Severity of Illness: The appropriate patient status for this patient is OBSERVATION. Observation status is judged to be reasonable and necessary in order to provide the required intensity of service to ensure the patient's safety. The patient's presenting symptoms, physical exam findings, and initial radiographic and laboratory data in the context of their medical condition is felt to place them at decreased risk for further clinical deterioration. Furthermore, it is anticipated that the patient will be medically stable for discharge  from the hospital within 2 midnights of admission.   Author: Alm Schneider, MD 10/01/2023 2:25 PM  For on call review [email or web address].

## 2023-10-02 DIAGNOSIS — F0283 Dementia in other diseases classified elsewhere, unspecified severity, with mood disturbance: Secondary | ICD-10-CM | POA: Diagnosis present

## 2023-10-02 DIAGNOSIS — G20C Parkinsonism, unspecified: Secondary | ICD-10-CM | POA: Diagnosis not present

## 2023-10-02 DIAGNOSIS — I10 Essential (primary) hypertension: Secondary | ICD-10-CM | POA: Diagnosis present

## 2023-10-02 DIAGNOSIS — R4189 Other symptoms and signs involving cognitive functions and awareness: Secondary | ICD-10-CM | POA: Diagnosis present

## 2023-10-02 DIAGNOSIS — I959 Hypotension, unspecified: Secondary | ICD-10-CM | POA: Diagnosis not present

## 2023-10-02 DIAGNOSIS — R339 Retention of urine, unspecified: Secondary | ICD-10-CM | POA: Diagnosis not present

## 2023-10-02 DIAGNOSIS — G20A1 Parkinson's disease without dyskinesia, without mention of fluctuations: Secondary | ICD-10-CM | POA: Diagnosis present

## 2023-10-02 DIAGNOSIS — K7581 Nonalcoholic steatohepatitis (NASH): Secondary | ICD-10-CM | POA: Diagnosis present

## 2023-10-02 DIAGNOSIS — Z888 Allergy status to other drugs, medicaments and biological substances status: Secondary | ICD-10-CM | POA: Diagnosis not present

## 2023-10-02 DIAGNOSIS — N179 Acute kidney failure, unspecified: Secondary | ICD-10-CM | POA: Diagnosis present

## 2023-10-02 DIAGNOSIS — E875 Hyperkalemia: Secondary | ICD-10-CM

## 2023-10-02 DIAGNOSIS — Z79899 Other long term (current) drug therapy: Secondary | ICD-10-CM | POA: Diagnosis not present

## 2023-10-02 DIAGNOSIS — Z882 Allergy status to sulfonamides status: Secondary | ICD-10-CM | POA: Diagnosis not present

## 2023-10-02 DIAGNOSIS — E860 Dehydration: Secondary | ICD-10-CM | POA: Diagnosis present

## 2023-10-02 DIAGNOSIS — Z880 Allergy status to penicillin: Secondary | ICD-10-CM | POA: Diagnosis not present

## 2023-10-02 DIAGNOSIS — R296 Repeated falls: Secondary | ICD-10-CM | POA: Diagnosis present

## 2023-10-02 DIAGNOSIS — E782 Mixed hyperlipidemia: Secondary | ICD-10-CM | POA: Diagnosis present

## 2023-10-02 DIAGNOSIS — Z8249 Family history of ischemic heart disease and other diseases of the circulatory system: Secondary | ICD-10-CM | POA: Diagnosis not present

## 2023-10-02 DIAGNOSIS — Z83438 Family history of other disorder of lipoprotein metabolism and other lipidemia: Secondary | ICD-10-CM | POA: Diagnosis not present

## 2023-10-02 DIAGNOSIS — G9341 Metabolic encephalopathy: Secondary | ICD-10-CM | POA: Diagnosis present

## 2023-10-02 DIAGNOSIS — N401 Enlarged prostate with lower urinary tract symptoms: Secondary | ICD-10-CM | POA: Diagnosis present

## 2023-10-02 DIAGNOSIS — F209 Schizophrenia, unspecified: Secondary | ICD-10-CM | POA: Diagnosis present

## 2023-10-02 LAB — HIV ANTIBODY (ROUTINE TESTING W REFLEX): HIV Screen 4th Generation wRfx: NONREACTIVE

## 2023-10-02 LAB — CBC
HCT: 34.8 % — ABNORMAL LOW (ref 39.0–52.0)
Hemoglobin: 11.4 g/dL — ABNORMAL LOW (ref 13.0–17.0)
MCH: 32.9 pg (ref 26.0–34.0)
MCHC: 32.8 g/dL (ref 30.0–36.0)
MCV: 100.3 fL — ABNORMAL HIGH (ref 80.0–100.0)
Platelets: 128 K/uL — ABNORMAL LOW (ref 150–400)
RBC: 3.47 MIL/uL — ABNORMAL LOW (ref 4.22–5.81)
RDW: 12.3 % (ref 11.5–15.5)
WBC: 8.9 K/uL (ref 4.0–10.5)
nRBC: 0 % (ref 0.0–0.2)

## 2023-10-02 LAB — BASIC METABOLIC PANEL WITH GFR
Anion gap: 12 (ref 5–15)
BUN: 48 mg/dL — ABNORMAL HIGH (ref 8–23)
CO2: 24 mmol/L (ref 22–32)
Calcium: 8.3 mg/dL — ABNORMAL LOW (ref 8.9–10.3)
Chloride: 102 mmol/L (ref 98–111)
Creatinine, Ser: 3.12 mg/dL — ABNORMAL HIGH (ref 0.61–1.24)
GFR, Estimated: 22 mL/min — ABNORMAL LOW (ref >60)
Glucose, Bld: 125 mg/dL — ABNORMAL HIGH (ref 70–99)
Potassium: 4.1 mmol/L (ref 3.5–5.1)
Sodium: 138 mmol/L (ref 135–145)

## 2023-10-02 LAB — T4, FREE
Free T4: 0.87 ng/dL (ref 0.61–1.12)
Free T4: 0.78 ng/dL (ref 0.61–1.12)

## 2023-10-02 LAB — MRSA NEXT GEN BY PCR, NASAL: MRSA by PCR Next Gen: NOT DETECTED

## 2023-10-02 LAB — MAGNESIUM: Magnesium: 2.1 mg/dL (ref 1.7–2.4)

## 2023-10-02 MED ORDER — LACTATED RINGERS IV SOLN: INTRAVENOUS | Status: AC

## 2023-10-02 MED ORDER — CHLORHEXIDINE GLUCONATE CLOTH 2 % EX PADS
6.0000 | MEDICATED_PAD | Freq: Every day | CUTANEOUS | Status: DC
  Administered 2023-10-02 – 2023-10-05 (×4): 6 via TOPICAL

## 2023-10-02 NOTE — Evaluation (Signed)
 Physical Therapy Evaluation Patient Details Name: Mark Benjamin MRN: [id number] DOB: 1960-05-24 Today's Date: 10/02/2023  History of Present Illness  a 63 year old male with a history of Parkinson's disease, hypertension, hyperlipidemia, BPH, megaloblastic anemia, NASH presenting with hypotension from ALF, [employer].  Unfortunately, the patient is unable to provide any significant history secondary to his cognitive impairment.  Notably, the patient was in the emergency department at [facility] on 09/30/2023 after sustaining a fall.  Workup including CT of the brain was negative for any acute findings.  There was a mild right frontal scalp soft tissue edema/contusion.  The patient was discharged back to his facility in stable condition.  The patient was also in the ED at [facility] on 09/28/23 with a fall.  CT of the brain was negative.  The patient was discharged back to his facility.  Clinical Impression   Pt tolerated today's Physical Therapy Evaluation, well but with poor carryover for mobility due to pt's parkinsonism. Pt's baseline, per patient is independent with ambulation once getting going, assisted for bed mobility and transfers and dependent for ADLs at facility. Pt is poor historian, chart review shows level of dementia.  Currently, pt demonstrating max assist for all bed mobility and total assist for sit/stand attempt and repositioning in bed. Pt is limited significantly in functional mobility, transfers, ADLs and ambulation due to muscle weakness, reduced ROM, reduced activity tolerance and severe balance deficits in setting of parkinsonism.   Based upon these deficits/impairments, patient will benefit from continued skilled physical therapy services during remainder of hospital stay and at the next recommended venue of care to address deficits and promote return to optimal function.                If plan is discharge home, recommend the following:     Can travel by private vehicle    No    Equipment Recommendations None recommended by PT  Recommendations for Other Services       Functional Status Assessment Patient has had a recent decline in their functional status and demonstrates the ability to make significant improvements in function in a reasonable and predictable amount of time.     Precautions / Restrictions Precautions Precautions: None Recall of Precautions/Restrictions: Intact      Mobility  Bed Mobility Overal bed mobility: Needs Assistance Bed Mobility: Supine to Sit, Sit to Supine     Supine to sit: Max assist Sit to supine: Max assist   General bed mobility comments: max assist for supine to sit and back to supine. Pt groaning to initiate movement but unable to initiate movement toward EOB    Transfers Overall transfer level: Needs assistance   Transfers: Sit to/from Stand Sit to Stand: Total assist           General transfer comment: total assist x 1 for attempted sit/stand from EOB, partial elevation of pelvis off of bed. Multiple small sit/stands and lateral scooting for bed positioning at total assist.    Ambulation/Gait               General Gait Details: not indicated due to inability to stand with total assist.  Stairs            Wheelchair Mobility     Tilt Bed    Modified Rankin (Stroke Patients Only)       Balance Overall balance assessment: Needs assistance Sitting-balance support: Feet supported, No upper extremity supported Sitting balance-Leahy Scale: Zero Sitting balance - Comments: posterior falling  at EOB Postural control: Posterior lean Standing balance support: Bilateral upper extremity supported, During functional activity Standing balance-Leahy Scale: Zero Standing balance comment: max asssist with BUE support on therapist.                             Pertinent Vitals/Pain Pain Assessment Pain Assessment: No/denies pain    Home Living Family/patient expects to be  discharged to:: Assisted living                   Additional Comments: poor historian-unable to gather PLOF    Prior Function Prior Level of Function : Needs assist       Physical Assist : ADLs (physical)   ADLs (physical): Bathing;Dressing;Toileting Mobility Comments: Pt reports assistance with bed mobility and transfers, able to ambulate independently once getting started ADLs Comments: full dependence-per patient     Extremity/Trunk Assessment   Upper Extremity Assessment Upper Extremity Assessment: Defer to OT evaluation    Lower Extremity Assessment Lower Extremity Assessment: Generalized weakness       Communication        Cognition Arousal: Alert Behavior During Therapy: WFL for tasks assessed/performed   PT - Cognitive impairments: Orientation, Awareness   Orientation impairments: Time                   PT - Cognition Comments: Able to give name, place, situation, unable to give timeline. Following commands: Impaired Following commands impaired: Follows one step commands inconsistently     Cueing Cueing Techniques: Verbal cues, Tactile cues     General Comments      Exercises     Assessment/Plan    PT Assessment Patient needs continued PT services  PT Problem List Decreased strength;Decreased activity tolerance;Decreased balance;Decreased range of motion;Decreased mobility       PT Treatment Interventions DME instruction;Gait training;Functional mobility training;Therapeutic activities;Therapeutic exercise;Balance training;Neuromuscular re-education    PT Goals (Current goals can be found in the Care Plan section)  Acute Rehab PT Goals Patient Stated Goal: none stated PT Goal Formulation: With patient Time For Goal Achievement: 10/16/23 Potential to Achieve Goals: Good    Frequency Min 3X/week     Co-evaluation               AM-PAC PT 6 Clicks Mobility  Outcome Measure Help needed turning from your back to your  side while in a flat bed without using bedrails?: A Lot Help needed moving from lying on your back to sitting on the side of a flat bed without using bedrails?: A Lot Help needed moving to and from a bed to a chair (including a wheelchair)?: A Lot Help needed standing up from a chair using your arms (e.g., wheelchair or bedside chair)?: A Lot Help needed to walk in hospital room?: Total Help needed climbing 3-5 steps with a railing? : Total 6 Click Score: 10    End of Session Equipment Utilized During Treatment: Gait belt Activity Tolerance: Patient tolerated treatment well Patient left: in bed;with call bell/phone within reach;with bed alarm set Nurse Communication: Mobility status PT Visit Diagnosis: Muscle weakness (generalized) (M62.81);Other abnormalities of gait and mobility (R26.89)    Time: 9142-9084 PT Time Calculation (min) (ACUTE ONLY): 18 min   Charges:   PT Evaluation $PT Eval Low Complexity: 1 Low   PT General Charges $$ ACUTE PT VISIT: 1 Visit         Omega JONETTA Bottcher PT, DPT [facility] [place] [address] [phone] office  Omega JONETTA Bottcher 10/02/2023, 11:05 AM

## 2023-10-02 NOTE — Progress Notes (Signed)
 Patient has rested well and has been awake and able to hold a conversation during the shift.  Patient has had several drinks. No complaints of pain this shift.  Urinary output has been over 1000cc.

## 2023-10-02 NOTE — Plan of Care (Signed)

## 2023-10-02 NOTE — Progress Notes (Signed)
 PROGRESS NOTE  Mark Benjamin FMW:[id number] DOB: Oct 28, 1960 DOA: 10/01/2023 PCP: Pia Kerney SQUIBB, MD  Brief History:  63 year old male with a history of Parkinson's disease, hypertension, hyperlipidemia, BPH, megaloblastic anemia, NASH presenting with hypotension from ALF, [name].  Unfortunately, the patient is unable to provide any significant history secondary to his cognitive impairment.  Notably, the patient was in the emergency department at [facility] on 09/30/2023 after sustaining a fall.  Workup including CT of the brain was negative for any acute findings.  There was a mild right frontal scalp soft tissue edema/contusion.  The patient was discharged back to his facility in stable condition.  The patient was also in the ED at [facility] on 09/28/23 with a fall.  CT of the brain was negative.  The patient was discharged back to his facility.  Apparently on the morning of 10/01/2023, the patient was found to be hypotensive at his facility.  It is unclear exactly his blood pressure, but there was a written note with vital signs accompanying his paperwork showed a blood pressure of 97/75 with oxygen saturation 92% room air.  The patient was afebrile.  He denies any chest pain shortness of breath or abdominal pain.  Remainder review of systems unobtainable. Notably, review of his MAR from his facility shows that the patient recently finished a 10-day course of ciprofloxacin  on 09/29/2023.  In speaking with the patient's sister, she states that the patient has been more somnolent this past week with decreased oral intake.  She notes that he has had a gradual cognitive and functional decline over the last couple months. In the ED, the patient was afebrile hemodynamically stable with oxygen saturation 98% room air.  WBC 10.7, hemoglobin 12.8, platelets 146.  Sodium 141, potassium 5.2, bicarbonate 26, serum creatinine 3.95.  LFTs were unremarkable.  EKG showed sinus rhythm with no ST/T wave changes.  CT of  the abdomen and pelvis showed a Foley in the bladder with enlarged prostate.  There was no renal calculus.  There was no obstructive uropathy.  The patient was started IV fluids and admitted for further evaluation and treatment of his altered mental status and AKI.   Assessment/Plan: Acute metabolic encephalopathy -At baseline, patient needs assistance with his ADLs and is pleasantly confused - He is more somnolent and less interactive, falling asleep without any stimuli - Secondary to AKI - B12 - TSH - 7/9 and 7/11 CT brain neg - Folic acid - UA negative for pyuria - Holding AM Risperdal  temporarily - 7/13--more alert but not back to baseline   Parkinson's disease - Continue Sinemet    Urinary retention - Foley catheter placed in the ED - Continue Rapaflo  equivalent   Essential hypertension - He does not appear to be on any antihypertensive medications - BP remains controlled   Mood disorder - Continue Depakote  - Holding doxepin temporarily   Mixed hyperlipidemia - Continue Crestor            Family Communication:   sister 7/13  Consultants:  none  Code Status:  FULL   DVT Prophylaxis:  [place] Heparin     Procedures: As Listed in Progress Note Above  Antibiotics: None       Subjective: Patient denies fevers, chills, headache, chest pain, dyspnea, nausea, vomiting, abdominal pain,  ROS limited due to acute encephalopathy   Objective: Vitals:   10/01/23 2016 10/01/23 2346 10/02/23 0348 10/02/23 1556  BP: (!) 142/64 (!) 147/81 (!) 143/70 127/86  Pulse: 64  (!) 58 70  Resp: 17     Temp: 97.6 F (36.4 C) 97.8 F (36.6 C) 97.8 F (36.6 C) 99.1 F (37.3 C)  TempSrc: Oral Oral Oral Oral  SpO2: 100% 99% 98% 100%  Weight:      Height:        Intake/Output Summary (Last 24 hours) at 10/02/2023 1726 Last data filed at 10/02/2023 1247 Gross per 24 hour  Intake 1491.57 ml  Output 2525 ml  Net -1033.43 ml   Weight change:  Exam:  General:  Pt is  alert, follows commands appropriately, not in acute distress HEENT: No icterus, No thrush, No neck mass, [place]/AT Cardiovascular: RRR, S1/S2, no rubs, no gallops Respiratory: bibasilar rales.  No wheeze Abdomen: Soft/+BS, non tender, non distended, no guarding Extremities: No edema, No lymphangitis, No petechiae, No rashes, no synovitis   Data Reviewed: I have personally reviewed following labs and imaging studies Basic Metabolic Panel: Recent Labs  Lab 10/01/23 0942 10/02/23 0255 10/02/23 0955  NA 141  --  138  K 5.2*  --  4.1  CL 101  --  102  CO2 26  --  24  GLUCOSE 105*  --  125*  BUN 52*  --  48*  CREATININE 3.95*  --  3.12*  CALCIUM  9.5  --  8.3*  MG  --  2.1  --    Liver Function Tests: Recent Labs  Lab 10/01/23 0942  AST 29  ALT 8  ALKPHOS 52  BILITOT 0.7  PROT 7.3  ALBUMIN 3.7   No results for input(s): LIPASE, AMYLASE in the last 168 hours. No results for input(s): AMMONIA in the last 168 hours. Coagulation Profile: No results for input(s): INR, PROTIME in the last 168 hours. CBC: Recent Labs  Lab 10/01/23 0942 10/02/23 0955  WBC 10.7* 8.9  NEUTROABS 8.9*  --   HGB 12.8* 11.4*  HCT 38.8* 34.8*  MCV 100.8* 100.3*  PLT 146* 128*   Cardiac Enzymes: Recent Labs  Lab 10/01/23 1751  CKTOTAL 109   BNP: Invalid input(s): POCBNP CBG: No results for input(s): GLUCAP in the last 168 hours. HbA1C: No results for input(s): HGBA1C in the last 72 hours. Urine analysis:    Component Value Date/Time   COLORURINE STRAW (A) 10/01/2023 0923   APPEARANCEUR CLEAR 10/01/2023 0923   APPEARANCEUR Clear 09/08/2022 0924   LABSPEC 1.009 10/01/2023 0923   PHURINE 5.0 10/01/2023 0923   GLUCOSEU NEGATIVE 10/01/2023 0923   HGBUR NEGATIVE 10/01/2023 0923   BILIRUBINUR NEGATIVE 10/01/2023 0923   BILIRUBINUR Negative 09/08/2022 0924   KETONESUR NEGATIVE 10/01/2023 0923   PROTEINUR NEGATIVE 10/01/2023 0923   UROBILINOGEN negative 03/19/2015 1138    NITRITE NEGATIVE 10/01/2023 0923   LEUKOCYTESUR NEGATIVE 10/01/2023 0923   Sepsis Labs: @LABRCNTIP (procalcitonin:4,lacticidven:4) ) Recent Results (from the past 240 hours)  MRSA Next Gen by PCR, Nasal     Status: None   Collection Time: 10/02/23  4:00 AM   Specimen: Nasal Mucosa; Nasal Swab  Result Value Ref Range Status   MRSA by PCR Next Gen NOT DETECTED NOT DETECTED Final    Comment: (NOTE) The GeneXpert MRSA Assay (FDA approved for NASAL specimens only), is one component of a comprehensive MRSA colonization surveillance program. It is not intended to diagnose MRSA infection nor to guide or monitor treatment for MRSA infections. Test performance is not FDA approved in patients less than 43 years old. Performed at [facility], [address], [place], [place] [address]  Scheduled Meds:  carbidopa -levodopa   1 tablet Oral Q24H   carbidopa -levodopa   1 tablet Oral Q24H   carbidopa -levodopa   1.5 tablet Oral Q24H   Chlorhexidine  Gluconate Cloth  6 each Topical Daily   divalproex   250 mg Oral TID   docusate sodium   100 mg Oral BID   gabapentin   100 mg Oral QHS   glycopyrrolate   1 mg Oral BID   heparin   5,000 Units Subcutaneous Q8H   risperiDONE   2.5 mg Oral QHS   [START ON 10/03/2023] rosuvastatin   2.5 mg Oral QODAY   tamsulosin   0.4 mg Oral QPC supper   Continuous Infusions:  lactated ringers  100 mL/hr at 10/02/23 1156    Procedures/Studies: CT ABDOMEN PELVIS WO CONTRAST Result Date: 10/01/2023 CLINICAL DATA:  Acute kidney failure. Multiple falls with weakness and hypotension. EXAM: CT ABDOMEN AND PELVIS WITHOUT CONTRAST TECHNIQUE: Multidetector CT imaging of the abdomen and pelvis was performed following the standard protocol without IV contrast. RADIATION DOSE REDUCTION: This exam was performed according to the departmental dose-optimization program which includes automated exposure control, adjustment of the mA and/or kV according to patient size and/or use of iterative  reconstruction technique. COMPARISON:  None Available. FINDINGS: Lower chest: No acute abnormality. Hepatobiliary: No focal liver abnormality is seen. No gallstones, gallbladder wall thickening, or biliary dilatation. Pancreas: Unremarkable. No pancreatic ductal dilatation or surrounding inflammatory changes. Spleen: Normal in size without focal abnormality. Adrenals/Urinary Tract: The adrenal glands are within normal limits. No renal calculus or obstructive uropathy bilaterally. Evaluation of the mid to distal ureters is limited due to overlapping structures. A Foley catheter is noted in the urinary bladder. Stomach/Bowel: Stomach is within normal limits. Appendix is not seen. No evidence of bowel wall thickening, distention, or inflammatory changes. No free air or pneumatosis. Vascular/Lymphatic: Aortic atherosclerosis. No enlarged abdominal or pelvic lymph nodes. Reproductive: Prostate gland is enlarged. Other: No abdominopelvic ascites. Musculoskeletal: Degenerative changes are present in the thoracolumbar spine. No acute osseous abnormality is seen. IMPRESSION: 1. No renal calculus or obstructive uropathy bilaterally. 2. Foley catheter in the urinary bladder. 3. Enlarged prostate gland. 4. Aortic atherosclerosis. Electronically Signed   By: Leita Birmingham M.D.   On: 10/01/2023 12:41   DG Chest Portable 1 View Result Date: 10/01/2023 CLINICAL DATA:  hypotension EXAM: PORTABLE CHEST 1 VIEW COMPARISON:  November 10, 2022 FINDINGS: Evaluation is limited by rotation. The cardiomediastinal silhouette is unchanged in contour.Atherosclerotic calcifications. No pleural effusion. No pneumothorax. No acute pleuroparenchymal abnormality. IMPRESSION: No acute cardiopulmonary abnormality. Electronically Signed   By: Corean Salter M.D.   On: 10/01/2023 11:21    Alm Schneider, DO  [facility]  If 7PM-7AM, please contact night-coverage [email or web address] Password [facility] 10/02/2023, 5:26 PM   LOS: 0 days

## 2023-10-02 NOTE — Plan of Care (Signed)
  Problem: Acute Rehab PT Goals(only PT should resolve) Goal: Pt Will Go Supine/Side To Sit Flowsheets (Taken 10/02/2023 1111) Pt will go Supine/Side to Sit: with minimal assist Goal: Patient Will Transfer Sit To/From Stand Flowsheets (Taken 10/02/2023 1111) Patient will transfer sit to/from stand: with minimal assist Goal: Pt Will Transfer Bed To Chair/Chair To Bed Flowsheets (Taken 10/02/2023 1111) Pt will Transfer Bed to Chair/Chair to Bed: with min assist Goal: Pt Will Perform Standing Balance Or Pre-Gait Flowsheets (Taken 10/02/2023 1111) Pt will perform standing balance or pre-gait: with Supervision Goal: Pt Will Ambulate Flowsheets (Taken 10/02/2023 1111) Pt will Ambulate:  25 feet  with supervision  with least restrictive assistive device  Omega JONETTA Bottcher PT, DPT [facility] [place] [phone] office

## 2023-10-03 DIAGNOSIS — G20C Parkinsonism, unspecified: Secondary | ICD-10-CM | POA: Diagnosis not present

## 2023-10-03 DIAGNOSIS — N179 Acute kidney failure, unspecified: Secondary | ICD-10-CM | POA: Diagnosis not present

## 2023-10-03 DIAGNOSIS — G9341 Metabolic encephalopathy: Secondary | ICD-10-CM | POA: Diagnosis not present

## 2023-10-03 LAB — BASIC METABOLIC PANEL WITH GFR
Anion gap: 10 (ref 5–15)
BUN: 47 mg/dL — ABNORMAL HIGH (ref 8–23)
CO2: 25 mmol/L (ref 22–32)
Calcium: 8.3 mg/dL — ABNORMAL LOW (ref 8.9–10.3)
Chloride: 104 mmol/L (ref 98–111)
Creatinine, Ser: 2.53 mg/dL — ABNORMAL HIGH (ref 0.61–1.24)
GFR, Estimated: 28 mL/min — ABNORMAL LOW (ref >60)
Glucose, Bld: 92 mg/dL (ref 70–99)
Potassium: 4.3 mmol/L (ref 3.5–5.1)
Sodium: 139 mmol/L (ref 135–145)

## 2023-10-03 LAB — VITAMIN B12: Vitamin B-12: 478 pg/mL (ref 180–914)

## 2023-10-03 LAB — FOLATE: Folate: 16.8 ng/mL (ref >5.9)

## 2023-10-03 LAB — TSH: TSH: 5.292 u[IU]/mL — ABNORMAL HIGH (ref 0.350–4.500)

## 2023-10-03 NOTE — TOC Initial Note (Signed)
 Transition of Care [facility]) - Initial/Assessment Note    Patient Details  Name: Mark Benjamin MRN: [id number] Date of Birth: 02/10/1961  Transition [facility]) CM/SW Contact:    Lucie Lunger, LCSWA Phone Number: 10/03/2023, 11:29 AM  Clinical Narrative:                 CSW noted per chart review that pt arrived from [facility] ALF, they are concerned about pts level of care and feel that he needs a higher level at this time. PT is recommending SNF for pt. CSW spoke with pts sister who is his POA. She is agreeable to SNF placement and would like referral sent out to [place], [place], [place] and [place] facilities. CSW to complete referral and send out for review. TOC to follow.   Expected Discharge Plan: Skilled Nursing Facility Barriers to Discharge: Continued Medical Work up   Patient Goals and CMS Choice Patient states their goals for this hospitalization and ongoing recovery are:: go to SNF CMS Medicare.gov Compare Post Acute Care list provided to:: Patient Represenative (must comment) Choice offered to / list presented to : [facility] POA / Guardian [facility] ownership interest in [facility].provided to:: [facility] POA / Guardian    Expected Discharge Plan and Services In-house Referral: Clinical Social Work Discharge Planning Services: CM Consult Post Acute Care Choice: Skilled Nursing Facility Living arrangements for the past 2 months: Single Family Home                                      Prior Living Arrangements/Services Living arrangements for the past 2 months: Single Family Home Lives with:: Facility Resident Patient language and need for interpreter reviewed:: Yes Do you feel safe going back to the place where you live?: Yes      Need for Family Participation in Patient Care: Yes (Comment) Care giver support system in place?: Yes (comment)   Criminal Activity/Legal Involvement Pertinent to Current Situation/Hospitalization: No -  Comment as needed  Activities of Daily Living   ADL Screening (condition at time of admission) Independently performs ADLs?: No Does the patient have a NEW difficulty with bathing/dressing/toileting/self-feeding that is expected to last >3 days?: No Does the patient have a NEW difficulty with getting in/out of bed, walking, or climbing stairs that is expected to last >3 days?: No Does the patient have a NEW difficulty with communication that is expected to last >3 days?: No  Permission Sought/Granted                  Emotional Assessment         Alcohol  / Substance Use: Not Applicable Psych Involvement: No (comment)  Admission diagnosis:  Hyperkalemia [E87.5] Urinary retention [R33.9] Renal insufficiency [N28.9] AKI (acute kidney injury) (HCC) [N17.9] Hypotension, unspecified hypotension type [I95.9] Dementia due to Parkinson's disease, unspecified dementia severity, unspecified whether behavioral, psychotic, or mood disturbance or anxiety (HCC) [G20.A1, F02.80] Patient Active Problem List   Diagnosis Date Noted   Hyperkalemia 10/02/2023   AKI (acute kidney injury) (HCC) 10/01/2023   Acute metabolic encephalopathy 10/01/2023   Urinary urgency 03/07/2020   Incomplete emptying of bladder 12/05/2019   Cervical myelopathy (HCC) 08/24/2019   Elevated PSA 02/10/2017   Spinal stenosis of cervical region 02/08/2017   Parkinson disease (HCC) 09/05/2015   Vitamin D  deficiency 09/05/2015   Metabolic syndrome 04/02/2013   Hypertension 04/02/2013  Hyperlipidemia 04/02/2013   Benign prostatic hyperplasia with urinary obstruction 04/02/2013   Left arm weakness 11/21/2012   Parkinsonism (HCC) 11/21/2012   Neck pain 11/21/2012   Colon adenomas 06/29/2012   Rectal mass 06/29/2012   Schizophrenia (HCC) 06/22/2012   Dysphagia 06/22/2011   Eosinophilic esophagitis 06/22/2011   PCP:  Pia Kerney SQUIBB, MD Pharmacy:   [facility] [place], [place] - [address] [place] [address] Phone: [phone] Fax: [phone]     Social Drivers of Health (SDOH) Social History: SDOH Screenings   Food Insecurity: Patient Unable To Answer (10/01/2023)  Housing: Patient Unable To Answer (10/01/2023)  Transportation Needs: Patient Unable To Answer (10/01/2023)  Depression (PHQ2-9): Low Risk  (07/28/2020)  Financial Resource Strain: Low Risk  (05/04/2017)  Physical Activity: Unknown (08/30/2018)  Social Connections: Unknown (08/30/2018)  Stress: Stress Concern Present (05/04/2017)  Tobacco Use: Low Risk  (10/01/2023)   SDOH Interventions:     Readmission Risk Interventions     No data to display

## 2023-10-03 NOTE — Progress Notes (Addendum)
 PROGRESS NOTE  Mark Benjamin FMW:[id number] DOB: 04-May-1960 DOA: 10/01/2023 PCP: Pia Kerney SQUIBB, MD  Brief History:  63 year old male with a history of Parkinson's disease, hypertension, hyperlipidemia, BPH, megaloblastic anemia, NASH presenting with hypotension from ALF, [name].  Unfortunately, the patient is unable to provide any significant history secondary to his cognitive impairment.  Notably, the patient was in the emergency department at [facility] on 09/30/2023 after sustaining a fall.  Workup including CT of the brain was negative for any acute findings.  There was a mild right frontal scalp soft tissue edema/contusion.  The patient was discharged back to his facility in stable condition.  The patient was also in the ED at [facility] on 09/28/23 with a fall.  CT of the brain was negative.  The patient was discharged back to his facility.  Apparently on the morning of 10/01/2023, the patient was found to be hypotensive at his facility.  It is unclear exactly his blood pressure, but there was a written note with vital signs accompanying his paperwork showed a blood pressure of 97/75 with oxygen saturation 92% room air.  The patient was afebrile.  He denies any chest pain shortness of breath or abdominal pain.  Remainder review of systems unobtainable. Notably, review of his MAR from his facility shows that the patient recently finished a 10-day course of ciprofloxacin  on 09/29/2023.  In speaking with the patient's sister, she states that the patient has been more somnolent this past week with decreased oral intake.  She notes that he has had a gradual cognitive and functional decline over the last couple months. In the ED, the patient was afebrile hemodynamically stable with oxygen saturation 98% room air.  WBC 10.7, hemoglobin 12.8, platelets 146.  Sodium 141, potassium 5.2, bicarbonate 26, serum creatinine 3.95.  LFTs were unremarkable.  EKG showed sinus rhythm with no ST/T wave changes.  CT of  the abdomen and pelvis showed a Foley in the bladder with enlarged prostate.  There was no renal calculus.  There was no obstructive uropathy.  The patient was started IV fluids and admitted for further evaluation and treatment of his altered mental status and AKI.   Assessment/Plan:  Acute metabolic encephalopathy -At baseline, patient needs assistance with his ADLs and is pleasantly confused - He is more somnolent and less interactive, falling asleep without any stimuli - Secondary to AKI - B12--478 - TSH--5.292; FT4= 0.87 - 7/9 and 7/11 CT brain neg - Folic acid--16.8 - UA negative for pyuria - Holding AM Risperdal  temporarily - 7/13--more alert but not back to baseline - 7/14 -mental status continues to improve  AKI -baseline creatinine 0.7-1.0 -due to volume depletion -presented with serum creatinine 3.95 -continue IVF>>improving   Parkinson's disease - Continue Sinemet    Urinary retention - Foley catheter placed in the ED - Continue Rapaflo  equivalent   Essential hypertension - He does not appear to be on any antihypertensive medications - BP remains controlled   Mood disorder - Continue Depakote  - Holding doxepin temporarily   Mixed hyperlipidemia - Continue Crestor                    Family Communication:   sister 7/13   Consultants:  none   Code Status:  FULL    DVT Prophylaxis:  Lugoff Heparin       Procedures: As Listed in Progress Note Above   Antibiotics: None  Subjective: Patient denies fevers, chills, headache, chest pain, dyspnea, nausea, vomiting, diarrhea, abdominal pain,    Objective: Vitals:   10/02/23 1556 10/02/23 2059 10/03/23 0459 10/03/23 1418  BP: 127/86 (!) 137/90 111/69 111/68  Pulse: 70 73 74 79  Resp:  18 16 17   Temp: 99.1 F (37.3 C) 97.9 F (36.6 C) 98 F (36.7 C) 98.2 F (36.8 C)  TempSrc: Oral Oral Axillary   SpO2: 100% 98% 96% 96%  Weight:      Height:        Intake/Output Summary (Last 24  hours) at 10/03/2023 1647 Last data filed at 10/03/2023 1500 Gross per 24 hour  Intake 3244.41 ml  Output 4150 ml  Net -905.59 ml   Weight change:  Exam:  General:  Pt is alert, follows commands appropriately, not in acute distress HEENT: No icterus, No thrush, No neck mass, Briggs/AT Cardiovascular: RRR, S1/S2, no rubs, no gallops Respiratory: CTA bilaterally, no wheezing, no crackles, no rhonchi Abdomen: Soft/+BS, non tender, non distended, no guarding Extremities: No edema, No lymphangitis, No petechiae, No rashes, no synovitis   Data Reviewed: I have personally reviewed following labs and imaging studies Basic Metabolic Panel: Recent Labs  Lab 10/01/23 0942 10/02/23 0255 10/02/23 0955 10/03/23 0438  NA 141  --  138 139  K 5.2*  --  4.1 4.3  CL 101  --  102 104  CO2 26  --  24 25  GLUCOSE 105*  --  125* 92  BUN 52*  --  48* 47*  CREATININE 3.95*  --  3.12* 2.53*  CALCIUM  9.5  --  8.3* 8.3*  MG  --  2.1  --   --    Liver Function Tests: Recent Labs  Lab 10/01/23 0942  AST 29  ALT 8  ALKPHOS 52  BILITOT 0.7  PROT 7.3  ALBUMIN 3.7   No results for input(s): LIPASE, AMYLASE in the last 168 hours. No results for input(s): AMMONIA in the last 168 hours. Coagulation Profile: No results for input(s): INR, PROTIME in the last 168 hours. CBC: Recent Labs  Lab 10/01/23 0942 10/02/23 0955  WBC 10.7* 8.9  NEUTROABS 8.9*  --   HGB 12.8* 11.4*  HCT 38.8* 34.8*  MCV 100.8* 100.3*  PLT 146* 128*   Cardiac Enzymes: Recent Labs  Lab 10/01/23 1751  CKTOTAL 109   BNP: Invalid input(s): POCBNP CBG: No results for input(s): GLUCAP in the last 168 hours. HbA1C: No results for input(s): HGBA1C in the last 72 hours. Urine analysis:    Component Value Date/Time   COLORURINE STRAW (A) 10/01/2023 0923   APPEARANCEUR CLEAR 10/01/2023 0923   APPEARANCEUR Clear 09/08/2022 0924   LABSPEC 1.009 10/01/2023 0923   PHURINE 5.0 10/01/2023 0923   GLUCOSEU  NEGATIVE 10/01/2023 0923   HGBUR NEGATIVE 10/01/2023 0923   BILIRUBINUR NEGATIVE 10/01/2023 0923   BILIRUBINUR Negative 09/08/2022 0924   KETONESUR NEGATIVE 10/01/2023 0923   PROTEINUR NEGATIVE 10/01/2023 0923   UROBILINOGEN negative 03/19/2015 1138   NITRITE NEGATIVE 10/01/2023 0923   LEUKOCYTESUR NEGATIVE 10/01/2023 0923   Sepsis Labs: @LABRCNTIP (procalcitonin:4,lacticidven:4) ) Recent Results (from the past 240 hours)  MRSA Next Gen by PCR, Nasal     Status: None   Collection Time: 10/02/23  4:00 AM   Specimen: Nasal Mucosa; Nasal Swab  Result Value Ref Range Status   MRSA by PCR Next Gen NOT DETECTED NOT DETECTED Final    Comment: (NOTE) The GeneXpert MRSA Assay (FDA approved for NASAL specimens only),  is one component of a comprehensive MRSA colonization surveillance program. It is not intended to diagnose MRSA infection nor to guide or monitor treatment for MRSA infections. Test performance is not FDA approved in patients less than 30 years old. Performed at [facility], [address], [place], [place] [address]      Scheduled Meds:  carbidopa -levodopa   1 tablet Oral Q24H   carbidopa -levodopa   1 tablet Oral Q24H   carbidopa -levodopa   1.5 tablet Oral Q24H   Chlorhexidine  Gluconate Cloth  6 each Topical Daily   divalproex   250 mg Oral TID   docusate sodium   100 mg Oral BID   gabapentin   100 mg Oral QHS   glycopyrrolate   1 mg Oral BID   heparin   5,000 Units Subcutaneous Q8H   risperiDONE   2.5 mg Oral QHS   rosuvastatin   2.5 mg Oral QODAY   tamsulosin   0.4 mg Oral QPC supper   Continuous Infusions:  lactated ringers  Stopped (10/03/23 0842)    Procedures/Studies: CT ABDOMEN PELVIS WO CONTRAST Result Date: 10/01/2023 CLINICAL DATA:  Acute kidney failure. Multiple falls with weakness and hypotension. EXAM: CT ABDOMEN AND PELVIS WITHOUT CONTRAST TECHNIQUE: Multidetector CT imaging of the abdomen and pelvis was performed following the standard protocol without IV  contrast. RADIATION DOSE REDUCTION: This exam was performed according to the departmental dose-optimization program which includes automated exposure control, adjustment of the mA and/or kV according to patient size and/or use of iterative reconstruction technique. COMPARISON:  None Available. FINDINGS: Lower chest: No acute abnormality. Hepatobiliary: No focal liver abnormality is seen. No gallstones, gallbladder wall thickening, or biliary dilatation. Pancreas: Unremarkable. No pancreatic ductal dilatation or surrounding inflammatory changes. Spleen: Normal in size without focal abnormality. Adrenals/Urinary Tract: The adrenal glands are within normal limits. No renal calculus or obstructive uropathy bilaterally. Evaluation of the mid to distal ureters is limited due to overlapping structures. A Foley catheter is noted in the urinary bladder. Stomach/Bowel: Stomach is within normal limits. Appendix is not seen. No evidence of bowel wall thickening, distention, or inflammatory changes. No free air or pneumatosis. Vascular/Lymphatic: Aortic atherosclerosis. No enlarged abdominal or pelvic lymph nodes. Reproductive: Prostate gland is enlarged. Other: No abdominopelvic ascites. Musculoskeletal: Degenerative changes are present in the thoracolumbar spine. No acute osseous abnormality is seen. IMPRESSION: 1. No renal calculus or obstructive uropathy bilaterally. 2. Foley catheter in the urinary bladder. 3. Enlarged prostate gland. 4. Aortic atherosclerosis. Electronically Signed   By: Leita Birmingham M.D.   On: 10/01/2023 12:41   DG Chest Portable 1 View Result Date: 10/01/2023 CLINICAL DATA:  hypotension EXAM: PORTABLE CHEST 1 VIEW COMPARISON:  November 10, 2022 FINDINGS: Evaluation is limited by rotation. The cardiomediastinal silhouette is unchanged in contour.Atherosclerotic calcifications. No pleural effusion. No pneumothorax. No acute pleuroparenchymal abnormality. IMPRESSION: No acute cardiopulmonary abnormality.  Electronically Signed   By: Corean Salter M.D.   On: 10/01/2023 11:21    Alm Schneider, DO  [facility]  If 7PM-7AM, please contact night-coverage [email or web address] Password [facility] 10/03/2023, 4:47 PM   LOS: 1 day

## 2023-10-03 NOTE — NC FL2 (Signed)
 [place]  MEDICAID FL2 LEVEL OF CARE FORM     IDENTIFICATION  Patient Name: Mark Benjamin Birthdate: 26-Mar-1960 Sex: male Admission Date (Current Location): 10/01/2023  [facility] and [place] Number:  [employer] and Address:  [facility],  [address], [place] [address]      Provider Number: [id number]  Attending Physician Name and Address:  Evonnie Lenis, MD  Relative Name and Phone Number:       Current Level of Care: Hospital Recommended Level of Care: Skilled Nursing Facility Prior Approval Number:    Date Approved/Denied:   PASRR Number:    Discharge Plan: SNF    Current Diagnoses: Patient Active Problem List   Diagnosis Date Noted   Hyperkalemia 10/02/2023   AKI (acute kidney injury) (HCC) 10/01/2023   Acute metabolic encephalopathy 10/01/2023   Urinary urgency 03/07/2020   Incomplete emptying of bladder 12/05/2019   Cervical myelopathy (HCC) 08/24/2019   Elevated PSA 02/10/2017   Spinal stenosis of cervical region 02/08/2017   Parkinson disease (HCC) 09/05/2015   Vitamin D  deficiency 09/05/2015   Metabolic syndrome 04/02/2013   Hypertension 04/02/2013   Hyperlipidemia 04/02/2013   Benign prostatic hyperplasia with urinary obstruction 04/02/2013   Left arm weakness 11/21/2012   Parkinsonism (HCC) 11/21/2012   Neck pain 11/21/2012   Colon adenomas 06/29/2012   Rectal mass 06/29/2012   Schizophrenia (HCC) 06/22/2012   Dysphagia 06/22/2011   Eosinophilic esophagitis 06/22/2011    Orientation RESPIRATION BLADDER Height & Weight     Self, Place  Normal Indwelling catheter Weight: 147 lb (66.7 kg) Height:  6' 1 (185.4 cm)  BEHAVIORAL SYMPTOMS/MOOD NEUROLOGICAL BOWEL NUTRITION STATUS      Continent Diet (DYS 2- see D/C summary)  AMBULATORY STATUS COMMUNICATION OF NEEDS Skin   Extensive Assist Verbally Normal                       Personal Care Assistance Level of Assistance  Bathing, Feeding, Dressing Bathing Assistance:  Limited assistance Feeding assistance: Independent Dressing Assistance: Limited assistance     Functional Limitations Info  Sight, Hearing, Speech Sight Info: Impaired Hearing Info: Adequate Speech Info: Adequate    SPECIAL CARE FACTORS FREQUENCY  PT (By licensed PT), OT (By licensed OT)     PT Frequency: 5 times weekly OT Frequency: 5 times weekly            Contractures Contractures Info: Not present    Additional Factors Info  Allergies, Code Status Code Status Info: FULL Allergies Info: Erythromycin, Penicillins, Sulfamethoxazole -trimethoprim , Zocor (Simvastatin), Amoxicillin           Current Medications (10/03/2023):  This is the current hospital active medication list Current Facility-Administered Medications  Medication Dose Route Frequency Provider Last Rate Last Admin   acetaminophen  (TYLENOL ) tablet 650 mg  650 mg Oral Q6H PRN Tat, David, MD   650 mg at 10/02/23 2251   Or   acetaminophen  (TYLENOL ) suppository 650 mg  650 mg Rectal Q6H PRN Tat, Lenis, MD       artificial tears ophthalmic solution 1 drop  1 drop Both Eyes QID PRN Tat, Lenis, MD       carbidopa -levodopa  (SINEMET  IR) 25-100 MG per tablet immediate release 1 tablet  1 tablet Oral Q24H Evonnie Lenis, MD   1 tablet at 10/02/23 2251   carbidopa -levodopa  (SINEMET  IR) 25-100 MG per tablet immediate release 1 tablet  1 tablet Oral Q24H Evonnie Lenis, MD   1 tablet at 10/03/23  9155   carbidopa -levodopa  (SINEMET  IR) 25-100 MG per tablet immediate release 1.5 tablet  1.5 tablet Oral Q24H Evonnie Lenis, MD   1.5 tablet at 10/02/23 1434   Chlorhexidine  Gluconate Cloth 2 % PADS 6 each  6 each Topical Daily Tat, David, MD   6 each at 10/03/23 0845   divalproex  (DEPAKOTE ) DR tablet 250 mg  250 mg Oral TID Evonnie Lenis, MD   250 mg at 10/03/23 0845   docusate sodium  (COLACE) capsule 100 mg  100 mg Oral BID Evonnie Lenis, MD   100 mg at 10/03/23 0845   gabapentin  (NEURONTIN ) capsule 100 mg  100 mg Oral QHS Tat, David, MD   100  mg at 10/02/23 2251   glycopyrrolate  (ROBINUL ) tablet 1 mg  1 mg Oral BID Evonnie Lenis, MD   1 mg at 10/03/23 0845   heparin  injection 5,000 Units  5,000 Units Subcutaneous Q8H Tat, David, MD   5,000 Units at 10/03/23 0543   lactated ringers  infusion   Intravenous Continuous Tat, Lenis, MD   Stopped at 10/03/23 [phone]   ondansetron  (ZOFRAN ) tablet 4 mg  4 mg Oral Q6H PRN Tat, Lenis, MD       Or   ondansetron  (ZOFRAN ) injection 4 mg  4 mg Intravenous Q6H PRN Tat, Lenis, MD       risperiDONE  (RISPERDAL ) tablet 2.5 mg  2.5 mg Oral QHS Evonnie Lenis, MD   2.5 mg at 10/02/23 2250   rosuvastatin  (CRESTOR ) tablet 2.5 mg  2.5 mg Oral QODAY Tat, David, MD   2.5 mg at 10/03/23 9155   tamsulosin  (FLOMAX ) capsule 0.4 mg  0.4 mg Oral QPC supper Tat, Lenis, MD   0.4 mg at 10/02/23 1814     Discharge Medications: Please see discharge summary for a list of discharge medications.  Relevant Imaging Results:  Relevant Lab Results:   Additional Information SSN: [id number]  Lucie Lunger, LCSWA

## 2023-10-03 NOTE — Evaluation (Signed)
 Clinical/Bedside Swallow Evaluation Patient Details  Name: Mark Benjamin MRN: [id number] Date of Birth: February 21, 1961  Today's Date: 10/03/2023 Time: SLP Start Time (ACUTE ONLY): 1040 SLP Stop Time (ACUTE ONLY): 1108 SLP Time Calculation (min) (ACUTE ONLY): 28 min  Past Medical History:  Past Medical History:  Diagnosis Date   Cataract    Colon polyps    Dysphagia    Essential hypertension, benign    Fatty liver    Hyperplasia of prostate    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    Other and unspecified hyperlipidemia    Parkinson disease (HCC)    Schizophrenia (HCC)    Past Surgical History:  Past Surgical History:  Procedure Laterality Date   COLONOSCOPY  02/13/08   COLONOSCOPY N/A 07/10/2012   Procedure: COLONOSCOPY;  Surgeon: Claudis RAYMOND Rivet, MD;  Location: [facility] SUITE;  Service: Endoscopy;  Laterality: N/A;  225   CYSTOSCOPY WITH INSERTION OF UROLIFT N/A 10/24/2017   Procedure: CYSTOSCOPY WITH INSERTION OF UROLIFT;  Surgeon: Sherrilee Belvie CROME, MD;  Location: [facility] ORS;  Service: Urology;  Laterality: N/A;   UPPER GASTROINTESTINAL ENDOSCOPY  EGD ED   09/09/2010   URETHRAL DILATION  1965   HPI:  Pt is a 63 y.o. male admitted 7/12 due to AKI, found to have acute metabolic encephalopathy. Pt lives in a LTC facility where he was found hypotensive and was sent out to [facility]. Pt recently in ED at [facility] on 09/30/2023 after sustaining a fall. Workup including CT of the brain was negative for any acute findings. There was a mild right frontal scalp soft tissue edema/contusion. The patient was discharged back to his facility in stable condition. The patient was also in the ED at [facility] on 09/28/23 with a fall. CT of the brain was negative. The patient was discharged back to his facility. PMHx significant for: Parkinson Disease, dysphagia, eosinophilic esophagitis (2013), HTN, Schizophrenia. A BSE was requested. NSG reports pt is eating Dys 2 diet without issues when  provided feeding A and taking meds whole in thin without difficulty or s/sx of aspiration.    Assessment / Plan / Recommendation  Clinical Impression  Pt's sister present at bedside providing hx as pt is a poor historian due to cognitive deficits. She states pt has coughing at baseline when multiple pills are administered at once and reports concern for inadequate nutrition. States pt's NP recently discussed having an objective swallow study.  Oral mech limited due to poor command following but oral motor skills appeared adequate for speech and eating. Volitional cough was weak. Pt consumed ice chips via spoon, thin liquids via spoon/cup/straw (pt's personal straw was used), and mech soft/mixed fruit cup via spoon. Pt required total feeding A. Pt thoroughly challenged with thin liquids and he consumed ~8 oz between water  and Dr. Nunzio. Swallows were timely and only 1 swallow per bolus was noted per palpation. Prolonged mastication with mech soft solids therefore, regular textures were not attempted. No lingual residue, oral holding, or pocketing observed. No overt s/sx of laryngeal penetration or aspiration were observed across consistencies. Silent aspiration cannot be ruled out at bedside but pt has no hx of recent PNAs.   Pt presents with a mild oral phase dysphagia characterized by prolonged mastication with mech soft texture solids. Family reports of difficulty with mixed consistencies (pills and thin) could be d/t either oral or pharyngeal deficits. SLP provided education on dysphagia and PD, clinical s/sx of aspiration to monitor for, importance  of oral care, and safe swallow strategies. Provided education on MBSS vs FEES options. Discussed that pt does not appear to need an objective swallow study while acute due to no clinical s/sx of aspiration at bedside or new onset of dysphagia sx but that an objective swallow study would still be appropriate at next level of care to determine baseline  functioning. Discussed that pt is at higher risk of silent aspiration and aspiration PNA given PD dx. Sister with questions re: thickened liquids; SLP provided education that thickened liquids do not necessarily prevent aspiration and may be contraindicated depending on the pt. Pt unable to verbalize understanding d/t cognition but sister verbalized understanding.   Recommend pt remain on Dys 2 with thin liquids. Give meds 1 at a time with thin liquids. Given swallowing changes with PD, recommended EMST to improve cough strength and pharyngeal strengthening exercises at next level of care if pt is able to follow commands as mentation improves. No ST services warranted at this level of care.    SLP Visit Diagnosis: Dysphagia, unspecified (R13.10)    Aspiration Risk  Risk for inadequate nutrition/hydration;Moderate aspiration risk    Diet Recommendation Dysphagia 2 (Fine chop);Thin liquid    Liquid Administration via: Spoon;Cup;Straw Medication Administration: Whole meds with liquid Supervision: Staff to assist with self feeding;Full supervision/cueing for compensatory strategies Compensations: Minimize environmental distractions;Slow rate;Small sips/bites Postural Changes: Seated upright at 90 degrees;Remain upright for at least 30 minutes after po intake    Other  Recommendations Oral Care Recommendations: Oral care BID;Staff/trained caregiver to provide oral care     Assistance Recommended at Discharge  Pt requires total feeding A and supervision for safe swallow strategies.      Frequency and Duration   N/A         Prognosis Prognosis for improved oropharyngeal function: Fair Barriers to Reach Goals: Cognitive deficits;Motivation;Other (Comment) (progressive nature of PD)      Swallow Study   General HPI: Pt is a 63 y.o. male admitted 7/12 due to AKI, found to have acute metabolic encephalopathy. Pt lives in a LTC facility where he was found hypotensive and was sent out to [facility]. Pt recently in ED at [facility] on 09/30/2023 after sustaining a fall. Workup including CT of the brain was negative for any acute findings. There was a mild right frontal scalp soft tissue edema/contusion. The patient was discharged back to his facility in stable condition. The patient was also in the ED at [facility] on 09/28/23 with a fall. CT of the brain was negative. The patient was discharged back to his facility. PMHx significant for: Parkinson Disease, dysphagia, eosinophilic esophagitis (2013), HTN, Schizophrenia. A BSE was requested. NSG reports pt is eating Dys 2 diet without issues when provided feeding A and taking meds whole in thin without difficulty or s/sx of aspiration.  Type of Study: Bedside Swallow Evaluation Previous Swallow Assessment: no Diet Prior to this Study: Dysphagia 2 (finely chopped);Thin liquids (Level 0) Temperature Spikes Noted: No Respiratory Status: Room air History of Recent Intubation: No Behavior/Cognition: Cooperative;Pleasant mood;Lethargic/Drowsy;Requires cueing Oral Cavity Assessment: Within Functional Limits Oral Care Completed by SLP: No Vision: Functional for self-feeding Self-Feeding Abilities: Total assist Patient Positioning: Upright in bed Baseline Vocal Quality: Normal Volitional Cough: Weak Volitional Swallow: Able to elicit    Oral/Motor/Sensory Function Overall Oral Motor/Sensory Function: Mild impairment (limited oral mech due to poor command following) Facial Symmetry: Within Functional Limits Lingual Symmetry: Within Functional Limits   Ice Chips Ice chips: Within functional  limits   Thin Liquid Thin Liquid: Within functional limits    Nectar Thick Nectar Thick Liquid: Not tested   Honey Thick Honey Thick Liquid: Not tested   Puree Puree: Not tested   Solid     Solid: Impaired Presentation: Spoon Oral Phase Impairments: Impaired mastication Oral Phase Functional Implications: Impaired mastication      Waddell JONETTA Novak, MA  CCC-SLP 10/03/2023,2:29 PM

## 2023-10-04 DIAGNOSIS — G9341 Metabolic encephalopathy: Secondary | ICD-10-CM | POA: Diagnosis not present

## 2023-10-04 DIAGNOSIS — I959 Hypotension, unspecified: Secondary | ICD-10-CM

## 2023-10-04 DIAGNOSIS — N179 Acute kidney failure, unspecified: Secondary | ICD-10-CM | POA: Diagnosis not present

## 2023-10-04 DIAGNOSIS — G20C Parkinsonism, unspecified: Secondary | ICD-10-CM | POA: Diagnosis not present

## 2023-10-04 LAB — BASIC METABOLIC PANEL WITH GFR
Anion gap: 9 (ref 5–15)
BUN: 38 mg/dL — ABNORMAL HIGH (ref 8–23)
CO2: 27 mmol/L (ref 22–32)
Calcium: 8.3 mg/dL — ABNORMAL LOW (ref 8.9–10.3)
Chloride: 104 mmol/L (ref 98–111)
Creatinine, Ser: 2.13 mg/dL — ABNORMAL HIGH (ref 0.61–1.24)
GFR, Estimated: 34 mL/min — ABNORMAL LOW (ref >60)
Glucose, Bld: 89 mg/dL (ref 70–99)
Potassium: 3.8 mmol/L (ref 3.5–5.1)
Sodium: 140 mmol/L (ref 135–145)

## 2023-10-04 MED ORDER — LACTATED RINGERS IV SOLN: INTRAVENOUS | Status: AC

## 2023-10-04 NOTE — TOC Progression Note (Signed)
 Transition of Care [facility]) - Progression Note    Patient Details  Name: Mark Benjamin MRN: [id number] Date of Birth: Mar 31, 1960  Transition of Care [facility]) CM/SW Contact  Lucie Lunger, [place] Phone Number: 10/04/2023, 11:48 AM  Clinical Narrative:    CSW reviewed bed offers with pts sister/POA who states she would like to accept bed offer at [facility] for SNF. CSW spoke to Kristin with facility who states they can accept pt tomorrow. CSW updated HUB to reflect bed choice. TOC to follow.   Expected Discharge Plan: Skilled Nursing Facility Barriers to Discharge: Continued Medical Work up  Expected Discharge Plan and Services In-house Referral: Clinical Social Work Discharge Planning Services: CM Consult Post Acute Care Choice: Skilled Nursing Facility Living arrangements for the past 2 months: Single Family Home                                       Social Determinants of Health (SDOH) Interventions SDOH Screenings   Food Insecurity: Patient Unable To Answer (10/01/2023)  Housing: Patient Unable To Answer (10/01/2023)  Transportation Needs: Patient Unable To Answer (10/01/2023)  Depression (PHQ2-9): Low Risk  (07/28/2020)  Financial Resource Strain: Low Risk  (05/04/2017)  Physical Activity: Unknown (08/30/2018)  Social Connections: Unknown (08/30/2018)  Stress: Stress Concern Present (05/04/2017)  Tobacco Use: Low Risk  (10/01/2023)    Readmission Risk Interventions     No data to display

## 2023-10-04 NOTE — Plan of Care (Signed)

## 2023-10-04 NOTE — Plan of Care (Signed)
  Problem: Health Behavior/Discharge Planning: Goal: Ability to manage health-related needs will improve Outcome: Progressing   Problem: Clinical Measurements: Goal: Ability to maintain clinical measurements within normal limits will improve Outcome: Progressing Goal: Will remain free from infection Outcome: Progressing Goal: Cardiovascular complication will be avoided Outcome: Progressing   Problem: Activity: Goal: Risk for activity intolerance will decrease Outcome: Progressing   Problem: Nutrition: Goal: Adequate nutrition will be maintained Outcome: Progressing   Problem: Elimination: Goal: Will not experience complications related to urinary retention Outcome: Progressing   Problem: Pain Managment: Goal: General experience of comfort will improve and/or be controlled Outcome: Progressing   Problem: Elimination: Goal: Will not experience complications related to bowel motility Outcome: Not Progressing

## 2023-10-04 NOTE — Progress Notes (Signed)
 33fr foley catheter removed, patient tolerated well.

## 2023-10-04 NOTE — Progress Notes (Signed)
 PROGRESS NOTE  Mark Benjamin FMW:[id number] DOB: 12-28-1960 DOA: 10/01/2023 PCP: Pia Kerney SQUIBB, MD  Brief History:  63 year old male with a history of Parkinson's disease, hypertension, hyperlipidemia, BPH, megaloblastic anemia, NASH presenting with hypotension from ALF, [name].  Unfortunately, the patient is unable to provide any significant history secondary to his cognitive impairment.  Notably, the patient was in the emergency department at [facility] on 09/30/2023 after sustaining a fall.  Workup including CT of the brain was negative for any acute findings.  There was a mild right frontal scalp soft tissue edema/contusion.  The patient was discharged back to his facility in stable condition.  The patient was also in the ED at [facility] on 09/28/23 with a fall.  CT of the brain was negative.  The patient was discharged back to his facility.  Apparently on the morning of 10/01/2023, the patient was found to be hypotensive at his facility.  It is unclear exactly his blood pressure, but there was a written note with vital signs accompanying his paperwork showed a blood pressure of 97/75 with oxygen saturation 92% room air.  The patient was afebrile.  He denies any chest pain shortness of breath or abdominal pain.  Remainder review of systems unobtainable. Notably, review of his MAR from his facility shows that the patient recently finished a 10-day course of ciprofloxacin  on 09/29/2023.  In speaking with the patient's sister, she states that the patient has been more somnolent this past week with decreased oral intake.  She notes that he has had a gradual cognitive and functional decline over the last couple months. In the ED, the patient was afebrile hemodynamically stable with oxygen saturation 98% room air.  WBC 10.7, hemoglobin 12.8, platelets 146.  Sodium 141, potassium 5.2, bicarbonate 26, serum creatinine 3.95.  LFTs were unremarkable.  EKG showed sinus rhythm with no ST/T wave changes.  CT of  the abdomen and pelvis showed a Foley in the bladder with enlarged prostate.  There was no renal calculus.  There was no obstructive uropathy.  The patient was started IV fluids and admitted for further evaluation and treatment of his altered mental status and AKI.   Assessment/Plan: Acute metabolic encephalopathy -At baseline, patient needs assistance with his ADLs and is pleasantly confused - He is more somnolent and less interactive, falling asleep without any stimuli - Secondary to AKI - B12--478 - TSH--5.292; FT4= 0.87 - 7/9 and 7/11 CT brain neg - Folic acid--16.8 - UA negative for pyuria - Holding AM Risperdal  temporarily - 7/13--more alert but not back to baseline - 7/14 -mental status continues to improve   AKI -baseline creatinine 0.7-1.0 -due to volume depletion -presented with serum creatinine 3.95 -continue IVF>>improving   Parkinson's disease - Continue Sinemet    Urinary retention - Foley catheter placed in the ED - Continue Rapaflo  equivalent - d/c foley for voiding trial   Essential hypertension - He does not appear to be on any antihypertensive medications - BP remains controlled   Mood disorder - Continue Depakote  - Holding doxepin temporarily   Mixed hyperlipidemia - Continue Crestor                    Family Communication:   sister 7/14   Consultants:  none   Code Status:  FULL    DVT Prophylaxis:  [place] Heparin       Procedures: As Listed in Progress Note Above   Antibiotics: None  Subjective: Pt denies cp, sob, abd pain, n/vd  Objective: Vitals:   10/03/23 1418 10/03/23 2001 10/04/23 0447 10/04/23 1315  BP: 111/68 (!) 154/75 113/62 (!) 141/68  Pulse: 79 85 (!) 47 69  Resp: 17 16 17 16   Temp: 98.2 F (36.8 C) 98 F (36.7 C) 98.3 F (36.8 C) 98.3 F (36.8 C)  TempSrc:  Oral Axillary Oral  SpO2: 96% 99% 97% 98%  Weight:      Height:        Intake/Output Summary (Last 24 hours) at 10/04/2023 1732 Last  data filed at 10/04/2023 1300 Gross per 24 hour  Intake 390 ml  Output 3250 ml  Net -2860 ml   Weight change:  Exam:  General:  Pt is alert, follows commands appropriately, not in acute distress HEENT: No icterus, No thrush, No neck mass, /AT Cardiovascular: RRR, S1/S2, no rubs, no gallops Respiratory: CTA bilaterally, no wheezing, no crackles, no rhonchi Abdomen: Soft/+BS, non tender, non distended, no guarding Extremities: No edema, No lymphangitis, No petechiae, No rashes, no synovitis   Data Reviewed: I have personally reviewed following labs and imaging studies Basic Metabolic Panel: Recent Labs  Lab 10/01/23 0942 10/02/23 0255 10/02/23 0955 10/03/23 0438 10/04/23 0417  NA 141  --  138 139 140  K 5.2*  --  4.1 4.3 3.8  CL 101  --  102 104 104  CO2 26  --  24 25 27   GLUCOSE 105*  --  125* 92 89  BUN 52*  --  48* 47* 38*  CREATININE 3.95*  --  3.12* 2.53* 2.13*  CALCIUM  9.5  --  8.3* 8.3* 8.3*  MG  --  2.1  --   --   --    Liver Function Tests: Recent Labs  Lab 10/01/23 0942  AST 29  ALT 8  ALKPHOS 52  BILITOT 0.7  PROT 7.3  ALBUMIN 3.7   No results for input(s): LIPASE, AMYLASE in the last 168 hours. No results for input(s): AMMONIA in the last 168 hours. Coagulation Profile: No results for input(s): INR, PROTIME in the last 168 hours. CBC: Recent Labs  Lab 10/01/23 0942 10/02/23 0955  WBC 10.7* 8.9  NEUTROABS 8.9*  --   HGB 12.8* 11.4*  HCT 38.8* 34.8*  MCV 100.8* 100.3*  PLT 146* 128*   Cardiac Enzymes: Recent Labs  Lab 10/01/23 1751  CKTOTAL 109   BNP: Invalid input(s): POCBNP CBG: No results for input(s): GLUCAP in the last 168 hours. HbA1C: No results for input(s): HGBA1C in the last 72 hours. Urine analysis:    Component Value Date/Time   COLORURINE STRAW (A) 10/01/2023 0923   APPEARANCEUR CLEAR 10/01/2023 0923   APPEARANCEUR Clear 09/08/2022 0924   LABSPEC 1.009 10/01/2023 0923   PHURINE 5.0 10/01/2023 0923    GLUCOSEU NEGATIVE 10/01/2023 0923   HGBUR NEGATIVE 10/01/2023 0923   BILIRUBINUR NEGATIVE 10/01/2023 0923   BILIRUBINUR Negative 09/08/2022 0924   KETONESUR NEGATIVE 10/01/2023 0923   PROTEINUR NEGATIVE 10/01/2023 0923   UROBILINOGEN negative 03/19/2015 1138   NITRITE NEGATIVE 10/01/2023 0923   LEUKOCYTESUR NEGATIVE 10/01/2023 0923   Sepsis Labs: @LABRCNTIP (procalcitonin:4,lacticidven:4) ) Recent Results (from the past 240 hours)  MRSA Next Gen by PCR, Nasal     Status: None   Collection Time: 10/02/23  4:00 AM   Specimen: Nasal Mucosa; Nasal Swab  Result Value Ref Range Status   MRSA by PCR Next Gen NOT DETECTED NOT DETECTED Final    Comment: (NOTE) The GeneXpert MRSA  Assay (FDA approved for NASAL specimens only), is one component of a comprehensive MRSA colonization surveillance program. It is not intended to diagnose MRSA infection nor to guide or monitor treatment for MRSA infections. Test performance is not FDA approved in patients less than 86 years old. Performed at [facility], [address] [place], [place] [address]      Scheduled Meds:  carbidopa -levodopa   1 tablet Oral Q24H   carbidopa -levodopa   1 tablet Oral Q24H   carbidopa -levodopa   1.5 tablet Oral Q24H   Chlorhexidine  Gluconate Cloth  6 each Topical Daily   divalproex   250 mg Oral TID   docusate sodium   100 mg Oral BID   gabapentin   100 mg Oral QHS   glycopyrrolate   1 mg Oral BID   heparin   5,000 Units Subcutaneous Q8H   risperiDONE   2.5 mg Oral QHS   rosuvastatin   2.5 mg Oral QODAY   tamsulosin   0.4 mg Oral QPC supper   Continuous Infusions:  Procedures/Studies: CT ABDOMEN PELVIS WO CONTRAST Result Date: 10/01/2023 CLINICAL DATA:  Acute kidney failure. Multiple falls with weakness and hypotension. EXAM: CT ABDOMEN AND PELVIS WITHOUT CONTRAST TECHNIQUE: Multidetector CT imaging of the abdomen and pelvis was performed following the standard protocol without IV contrast. RADIATION DOSE REDUCTION: This  exam was performed according to the departmental dose-optimization program which includes automated exposure control, adjustment of the mA and/or kV according to patient size and/or use of iterative reconstruction technique. COMPARISON:  None Available. FINDINGS: Lower chest: No acute abnormality. Hepatobiliary: No focal liver abnormality is seen. No gallstones, gallbladder wall thickening, or biliary dilatation. Pancreas: Unremarkable. No pancreatic ductal dilatation or surrounding inflammatory changes. Spleen: Normal in size without focal abnormality. Adrenals/Urinary Tract: The adrenal glands are within normal limits. No renal calculus or obstructive uropathy bilaterally. Evaluation of the mid to distal ureters is limited due to overlapping structures. A Foley catheter is noted in the urinary bladder. Stomach/Bowel: Stomach is within normal limits. Appendix is not seen. No evidence of bowel wall thickening, distention, or inflammatory changes. No free air or pneumatosis. Vascular/Lymphatic: Aortic atherosclerosis. No enlarged abdominal or pelvic lymph nodes. Reproductive: Prostate gland is enlarged. Other: No abdominopelvic ascites. Musculoskeletal: Degenerative changes are present in the thoracolumbar spine. No acute osseous abnormality is seen. IMPRESSION: 1. No renal calculus or obstructive uropathy bilaterally. 2. Foley catheter in the urinary bladder. 3. Enlarged prostate gland. 4. Aortic atherosclerosis. Electronically Signed   By: Leita Birmingham M.D.   On: 10/01/2023 12:41   DG Chest Portable 1 View Result Date: 10/01/2023 CLINICAL DATA:  hypotension EXAM: PORTABLE CHEST 1 VIEW COMPARISON:  November 10, 2022 FINDINGS: Evaluation is limited by rotation. The cardiomediastinal silhouette is unchanged in contour.Atherosclerotic calcifications. No pleural effusion. No pneumothorax. No acute pleuroparenchymal abnormality. IMPRESSION: No acute cardiopulmonary abnormality. Electronically Signed   By: Corean Salter M.D.   On: 10/01/2023 11:21    Alm Schneider, DO  [facility]  If 7PM-7AM, please contact night-coverage [email or web address] Password [facility] 10/04/2023, 5:32 PM   LOS: 2 days

## 2023-10-04 NOTE — Plan of Care (Signed)

## 2023-10-05 DIAGNOSIS — N179 Acute kidney failure, unspecified: Secondary | ICD-10-CM | POA: Diagnosis not present

## 2023-10-05 LAB — BASIC METABOLIC PANEL WITH GFR
Anion gap: 7 (ref 5–15)
BUN: 33 mg/dL — ABNORMAL HIGH (ref 8–23)
CO2: 27 mmol/L (ref 22–32)
Calcium: 8.3 mg/dL — ABNORMAL LOW (ref 8.9–10.3)
Chloride: 105 mmol/L (ref 98–111)
Creatinine, Ser: 1.64 mg/dL — ABNORMAL HIGH (ref 0.61–1.24)
GFR, Estimated: 47 mL/min — ABNORMAL LOW (ref >60)
Glucose, Bld: 92 mg/dL (ref 70–99)
Potassium: 3.6 mmol/L (ref 3.5–5.1)
Sodium: 139 mmol/L (ref 135–145)

## 2023-10-05 NOTE — Plan of Care (Signed)
   Problem: Education: Goal: Knowledge of General Education information will improve Description: Including pain rating scale, medication(s)/side effects and non-pharmacologic comfort measures Outcome: Progressing   Problem: Clinical Measurements: Goal: Ability to maintain clinical measurements within normal limits will improve Outcome: Progressing Goal: Diagnostic test results will improve Outcome: Progressing

## 2023-10-05 NOTE — Progress Notes (Signed)
 Several attempts made to give report at returning facility with no success. Was hung up on twice and left on hold for over 10 minutes the third time.

## 2023-10-05 NOTE — Discharge Summary (Signed)
 Physician Discharge Summary   Patient: Mark Benjamin MRN: [id number] DOB: 10-04-1960  Admit date:     10/01/2023  Discharge date: 10/05/23  Discharge Physician: Bernardino KATHEE Come   PCP: Pia Kerney SQUIBB, MD   Recommendations at discharge:  Follow up with PCP and/or SNF provider this week. Recommend recheck CBC and BMP. Will need repeat TFTs in 4 weeks due to abnormal TSH with normal free T4.   Discharge Diagnoses: Principal Problem:   AKI (acute kidney injury) (HCC) Active Problems:   Dysphagia   Parkinsonism (HCC)   Acute metabolic encephalopathy   Hyperkalemia   Hypotension  Hospital Course: 63 year old male with a history of Parkinson's disease, hypertension, hyperlipidemia, BPH, megaloblastic anemia, NASH presenting with hypotension from ALF, [facility].  Unfortunately, the patient is unable to provide any significant history secondary to his cognitive impairment.  Notably, the patient was in the emergency department at [facility] on 09/30/2023 after sustaining a fall.  Workup including CT of the brain was negative for any acute findings.  There was a mild right frontal scalp soft tissue edema/contusion.  The patient was discharged back to his facility in stable condition.  The patient was also in the ED at [facility] on 09/28/23 with a fall.  CT of the brain was negative.  The patient was discharged back to his facility.   Apparently on the morning of 10/01/2023, the patient was found to be hypotensive at his facility.  It is unclear exactly his blood pressure, but there was a written note with vital signs accompanying his paperwork showed a blood pressure of 97/75 with oxygen saturation 92% room air.  The patient was afebrile.  He denies any chest pain shortness of breath or abdominal pain.  Remainder review of systems unobtainable. Notably, review of his MAR from his facility shows that the patient recently finished a 10-day course of ciprofloxacin  on 09/29/2023.   In speaking with the patient's sister,  she states that the patient has been more somnolent this past week with decreased oral intake.  She notes that he has had a gradual cognitive and functional decline over the last couple months. In the ED, the patient was afebrile hemodynamically stable with oxygen saturation 98% room air.  WBC 10.7, hemoglobin 12.8, platelets 146.  Sodium 141, potassium 5.2, bicarbonate 26, serum creatinine 3.95.  LFTs were unremarkable.  EKG showed sinus rhythm with no ST/T wave changes.  CT of the abdomen and pelvis showed a Foley in the bladder with enlarged prostate.  There was no renal calculus.  There was no obstructive uropathy.  The patient was started IV fluids and admitted for further evaluation and treatment of his altered mental status and AKI.   Assessment and Plan: Acute metabolic encephalopathy: At baseline, patient needs assistance with his ADLs and is pleasantly confused. Initially he was somnolent, falling asleep without stimuli.  - With Tx below this has resolved. Will restart home medications.  - B12--478 - TSH--5.292; FT4= 0.87 - 7/9 and 7/11 CT brain neg - Folic acid--16.8 - UA negative for pyuria  AKI: Normal baseline SCr 0.7-1.0. Up to 3.95 on admission and has steadily improved with IVF. Also placed foley for urinary retention.    Parkinson's disease - Continue sinemet , cogentin   Urinary retention - Foley catheter placed in the ED, subsequently removed 7/15. He is voiding. Bladder scan on 7/16 shows 10ml.  - continue home silodosin , follow up with Dr. Sherrilee after discharge.    Essential hypertension - He does not  appear to be on any antihypertensive medications - BP remains controlled   Mood disorder - Continue depakote , gabapentin , sertraline   Mixed hyperlipidemia - Continue rosuvastatin   Consultants: None Procedures performed: None  Disposition: Skilled nursing facility Diet recommendation:  Regular diet of dysphagia 2. DISCHARGE MEDICATION: Allergies as of  10/05/2023       Reactions   Erythromycin Other (See Comments)   Unknown    Penicillins Other (See Comments)   Unknown    Sulfamethoxazole -trimethoprim  Other (See Comments)   Extreme weakness/lethargy   Zocor [simvastatin] Other (See Comments)   Unknown    Amoxicillin Other (See Comments)   Dizzy and blurred vision        Medication List     STOP taking these medications    ciprofloxacin  500 MG tablet Commonly known as: CIPRO        TAKE these medications    acetaminophen  325 MG tablet Commonly known as: TYLENOL  Take 650 mg by mouth every 6 (six) hours as needed for mild pain (pain score 1-3).   anti-nausea solution Take 30 mLs by mouth every 15 (fifteen) minutes as needed for nausea or vomiting. For 4 doses in 1 hour.   benztropine 1 MG tablet Commonly known as: COGENTIN Take 1 mg by mouth 2 (two) times daily.   carbidopa -levodopa  25-100 MG tablet Commonly known as: SINEMET  IR 1 tab at 8am, 1 tab at 2pm, 1.5 tabs at 8pm What changed:  how much to take how to take this when to take this   cholecalciferol 25 MCG (1000 UNIT) tablet Commonly known as: VITAMIN D3 Take 3,000 Units by mouth daily.   diclofenac Sodium 1 % Gel Commonly known as: VOLTAREN Apply 2 g topically daily as needed (pain). Lower back pain   divalproex  250 MG DR tablet Commonly known as: DEPAKOTE  Take 250 mg by mouth 3 (three) times daily.   docusate sodium  100 MG capsule Commonly known as: COLACE Take 100 mg by mouth 2 (two) times daily.   gabapentin  100 MG capsule Commonly known as: NEURONTIN  Take 200 mg by mouth at bedtime.   glycopyrrolate  1 MG tablet Commonly known as: ROBINUL  Take 1 tablet (1 mg total) by mouth 2 (two) times daily.   magnesium hydroxide 400 MG/5ML suspension Commonly known as: MILK OF MAGNESIA Take 30 mLs by mouth 2 (two) times daily as needed for mild constipation.   multivitamin with minerals Tabs tablet Take 1 tablet by mouth daily.   polyethylene  glycol 17 g packet Commonly known as: MIRALAX / GLYCOLAX Take 17 g by mouth daily.   PROBIOTIC ACIDOPHILUS PO Take 1 capsule by mouth daily.   risperiDONE  2 MG tablet Commonly known as: RISPERDAL  Take 2 mg by mouth 2 (two) times daily.   risperiDONE  0.5 MG tablet Commonly known as: RISPERDAL  Take 0.5 mg by mouth at bedtime. Along with the 2 mg tablet to equal 2.5 mg QHS   rosuvastatin  5 MG tablet Commonly known as: Crestor  Take 0.5 tablets (2.5 mg total) by mouth every other day. Take 1/2 tablet on Monday, Wednesday and Friday   sertraline 100 MG tablet Commonly known as: ZOLOFT Take 100 mg by mouth daily.   silodosin  8 MG Caps capsule Commonly known as: RAPAFLO  Take 1 capsule (8 mg total) by mouth daily with breakfast.        Contact information for follow-up providers     Pia Kerney SQUIBB, MD Follow up.   Specialty: Internal Medicine Contact information: [address] [place]  [address] [phone]              Contact information for after-discharge care     Destination     [facility] .   Service: Skilled Nursing Contact information: [address] [place]  [address] [phone]                    Discharge Exam: Filed Weights   10/01/23 0922 10/01/23 1657  Weight: 68 kg 66.7 kg  BP (!) 145/71 (BP Location: Right Arm)   Pulse (!) 48   Temp 98.3 F (36.8 C) (Axillary)   Resp 16   Ht 6' 1 (1.854 m)   Wt 66.7 kg   SpO2 97%   BMI 19.39 kg/m   Chronically ill-appearing male  Clear and nonlabored, diminished RRR Left hand contracture, diminished strength x4 extremities. Speech low volume. Bradykinetic with dystonic posture throughout without tremor. Sensation intact. Oriented, tracks with eyes, maintains attention during conversation. Increased spasticity in LUE with hand contracture. Overall, exam consistent with documented exam by neurology in 2020.   Condition at  discharge: stable  The results of significant diagnostics from this hospitalization (including imaging, microbiology, ancillary and laboratory) are listed below for reference.   Imaging Studies: CT ABDOMEN PELVIS WO CONTRAST Result Date: 10/01/2023 CLINICAL DATA:  Acute kidney failure. Multiple falls with weakness and hypotension. EXAM: CT ABDOMEN AND PELVIS WITHOUT CONTRAST TECHNIQUE: Multidetector CT imaging of the abdomen and pelvis was performed following the standard protocol without IV contrast. RADIATION DOSE REDUCTION: This exam was performed according to the departmental dose-optimization program which includes automated exposure control, adjustment of the mA and/or kV according to patient size and/or use of iterative reconstruction technique. COMPARISON:  None Available. FINDINGS: Lower chest: No acute abnormality. Hepatobiliary: No focal liver abnormality is seen. No gallstones, gallbladder wall thickening, or biliary dilatation. Pancreas: Unremarkable. No pancreatic ductal dilatation or surrounding inflammatory changes. Spleen: Normal in size without focal abnormality. Adrenals/Urinary Tract: The adrenal glands are within normal limits. No renal calculus or obstructive uropathy bilaterally. Evaluation of the mid to distal ureters is limited due to overlapping structures. A Foley catheter is noted in the urinary bladder. Stomach/Bowel: Stomach is within normal limits. Appendix is not seen. No evidence of bowel wall thickening, distention, or inflammatory changes. No free air or pneumatosis. Vascular/Lymphatic: Aortic atherosclerosis. No enlarged abdominal or pelvic lymph nodes. Reproductive: Prostate gland is enlarged. Other: No abdominopelvic ascites. Musculoskeletal: Degenerative changes are present in the thoracolumbar spine. No acute osseous abnormality is seen. IMPRESSION: 1. No renal calculus or obstructive uropathy bilaterally. 2. Foley catheter in the urinary bladder. 3. Enlarged prostate  gland. 4. Aortic atherosclerosis. Electronically Signed   By: Leita Birmingham M.D.   On: 10/01/2023 12:41   DG Chest Portable 1 View Result Date: 10/01/2023 CLINICAL DATA:  hypotension EXAM: PORTABLE CHEST 1 VIEW COMPARISON:  November 10, 2022 FINDINGS: Evaluation is limited by rotation. The cardiomediastinal silhouette is unchanged in contour.Atherosclerotic calcifications. No pleural effusion. No pneumothorax. No acute pleuroparenchymal abnormality. IMPRESSION: No acute cardiopulmonary abnormality. Electronically Signed   By: Corean Salter M.D.   On: 10/01/2023 11:21    Microbiology: Results for orders placed or performed during the hospital encounter of 10/01/23  MRSA Next Gen by PCR, Nasal     Status: None   Collection Time: 10/02/23  4:00 AM   Specimen: Nasal Mucosa; Nasal Swab  Result Value Ref Range Status   MRSA by PCR Next Gen NOT DETECTED  NOT DETECTED Final    Comment: (NOTE) The GeneXpert MRSA Assay (FDA approved for NASAL specimens only), is one component of a comprehensive MRSA colonization surveillance program. It is not intended to diagnose MRSA infection nor to guide or monitor treatment for MRSA infections. Test performance is not FDA approved in patients less than 110 years old. Performed at [facility], [address], [place], [place] [address]     Labs: CBC: Recent Labs  Lab 10/01/23 0942 10/02/23 0955  WBC 10.7* 8.9  NEUTROABS 8.9*  --   HGB 12.8* 11.4*  HCT 38.8* 34.8*  MCV 100.8* 100.3*  PLT 146* 128*   Basic Metabolic Panel: Recent Labs  Lab 10/01/23 0942 10/02/23 0255 10/02/23 0955 10/03/23 0438 10/04/23 0417 10/05/23 0421  NA 141  --  138 139 140 139  K 5.2*  --  4.1 4.3 3.8 3.6  CL 101  --  102 104 104 105  CO2 26  --  24 25 27 27   GLUCOSE 105*  --  125* 92 89 92  BUN 52*  --  48* 47* 38* 33*  CREATININE 3.95*  --  3.12* 2.53* 2.13* 1.64*  CALCIUM  9.5  --  8.3* 8.3* 8.3* 8.3*  MG  --  2.1  --   --   --   --    Liver Function  Tests: Recent Labs  Lab 10/01/23 0942  AST 29  ALT 8  ALKPHOS 52  BILITOT 0.7  PROT 7.3  ALBUMIN 3.7   CBG: No results for input(s): GLUCAP in the last 168 hours.  Discharge time spent: greater than 30 minutes.  Signed: Bernardino KATHEE Come, MD [facility] 10/05/2023

## 2023-10-05 NOTE — TOC Transition Note (Signed)
 Transition of Care [facility]) - Discharge Note   Patient Details  Name: Mark Benjamin MRN: [id number] Date of Birth: Nov 17, 1960  Transition of [facility] CM/SW Contact:  Lucie Lunger, LCSWA Phone Number: 10/05/2023, 11:51 AM  Clinical Narrative:    CSW updated that pt is medically stable for D/C today. CSW spoke to Kristin with [facility] who states that they are able to accept pt today. CSW sent D/C clinicals to facility via HUB. RN provided with room and report numbers. Med necessity sent to floor for RN. EMS called for transport. CSW spoke with pts sister Inocente to provide update on D/C to SNF, she is agreeable and has completed admission paperwork with facility staff. TOC signing off.   Final next level of care: Skilled Nursing Facility Barriers to Discharge: Barriers Resolved   Patient Goals and CMS Choice Patient states their goals for this hospitalization and ongoing recovery are:: go to SNF CMS Medicare.gov Compare Post Acute Care list provided to:: Patient Represenative (must comment) Choice offered to / list presented to : [facility] POA / Guardian [place] ownership interest in [facility].provided to:: [facility] POA / Guardian    Discharge Placement              Patient [facility] Patient to be transferred to facility by: EMS Name of family member notified: Sister Inocente Patient and family notified of of transfer: 10/05/23  Discharge Plan and Services Additional resources added to the After Visit Summary for   In-house Referral: Clinical Social Work Discharge Planning Services: CM Consult Post Acute Care Choice: Skilled Nursing Facility                               Social Drivers of Health (SDOH) Interventions SDOH Screenings   Food Insecurity: Patient Unable To Answer (10/01/2023)  Housing: Patient Unable To Answer (10/01/2023)  Transportation Needs: Patient Unable To Answer (10/01/2023)  Depression (PHQ2-9): Low Risk  (07/28/2020)   Financial Resource Strain: Low Risk  (05/04/2017)  Physical Activity: Unknown (08/30/2018)  Social Connections: Unknown (08/30/2018)  Stress: Stress Concern Present (05/04/2017)  Tobacco Use: Low Risk  (10/01/2023)     Readmission Risk Interventions     No data to display

## 2023-10-07 ENCOUNTER — Other Ambulatory Visit (HOSPITAL_COMMUNITY): Payer: Self-pay | Admitting: Internal Medicine

## 2023-10-07 DIAGNOSIS — R1310 Dysphagia, unspecified: Secondary | ICD-10-CM

## 2023-10-07 DIAGNOSIS — R059 Cough, unspecified: Secondary | ICD-10-CM

## 2023-10-25 ENCOUNTER — Ambulatory Visit (HOSPITAL_COMMUNITY)
Admission: RE | Admit: 2023-10-25 | Discharge: 2023-10-25 | Disposition: A | Discharge status: Home / Self Care | Source: Ambulatory Visit | Attending: Internal Medicine | Admitting: Internal Medicine

## 2023-10-25 DIAGNOSIS — R1310 Dysphagia, unspecified: Secondary | ICD-10-CM

## 2023-10-25 DIAGNOSIS — R059 Cough, unspecified: Secondary | ICD-10-CM

## 2023-10-25 DIAGNOSIS — R1312 Dysphagia, oropharyngeal phase: Secondary | ICD-10-CM | POA: Diagnosis present

## 2023-10-25 NOTE — Progress Notes (Addendum)
 Modified Barium Swallow Study  Patient Details  Name: Mark Benjamin MRN: [id number] Date of Birth: 1961/03/05  Today's Date: 10/25/2023  HPI/PMH: HPI: Pt is a 63 yo male referred for OP MBS due to pt having issues with swallowing. Pt resides at SNF and has medical hx of Parkinsons (diagnosed four years ago per pt), eosinophilic esophagitis *2013*, HTN, schizophrenia, falls, renal disease, left arm pain - negative cervical myelogram.  He was in the hospital this year with hypotension, renal issues.  He reports he felt dizzy last night and was hypotensive.  Reports he does not drink enough liquids.  Previously was on a dys2/thin diet at [employer]. He admits to dysphagia  - reporting he has a hard time with food sticking in his mouth and getting strangled on liquids.  Denies needing heimlich manuever nor having pna yet per pt.  Admits he lost some weight but thinks he gained some back.   Clinical Impression: Clinical Impression: Patient demonstrates mild oropharyngeal dysphagia - with sensorimotor defictis.  His dysphagia is marked by impaired oral coordination resulting in premature spillage of barium into pharynx *mostly with liquids*.  In addition, he demonstrates decreased tongue base retraction resulting in vallecular retention and impaired laryngeal closure with + mild aspiration of thin via straw *mixed with secretions*.  Cued cough, throat clear and hock did not clear aspirates.  Chin tuck posture using straw prevented aspiration however.  Following solids with liquids also decreases vallecular retention.  Pt denies having pneumonia and admits to poor liquid intake due to strangling - causing discomfort.  He may benefit from chin tuck posture with liquids and/or drinking slightly thicker liquids with meals *i.e. OJ, Ensure - as he reports he does not strangle with these.  Recommend SLP follow up to address cough/voice/swallowing including considering RMST to help with his swallowing and airway  protection.   Could not conduct esophageal sweep due to pt being in a wheelchair - blocking the view.  Advise to consider an antichoking device for pt to have in case of emergencies *such as Life Vac.  Educated pt to findings/recommendations using teach back and live video during eval.  Factors that may increase risk of adverse event in presence of aspiration Noe & Lianne 2021): Factors that may increase risk of adverse event in presence of aspiration Noe & Lianne 2021): Limited mobility; Weak cough   Recommendations/Plan: Swallowing Evaluation Recommendations Swallowing Evaluation Recommendations Dys3/ground meats/thin liquids if tolerated; extra gravies/sauces Liquid Administration via: Straw; Cup; spoon- Use straws for chin tuck if helpful Medication Administration: Whole meds with puree or nectar if not contraindicated (start and follow with thin liquids) Supervision: Full assist for feeding Swallowing strategies  : Slow rate; Chin tuck with liquids; Follow solids with liquids; Occasional dry swallow; Small bites/sips Postural changes: Position pt fully upright for meals; Stay upright 30-60 min after meals Oral care recommendations: Oral care QID (4x/day)    Treatment Plan No data recorded   Recommendations Recommendations for follow up therapy are one component of a multi-disciplinary discharge planning process, led by the attending physician.  Recommendations may be updated based on patient status, additional functional criteria and insurance authorization.  Assessment: Orofacial Exam: Orofacial Exam Oral Cavity: Oral Hygiene: WFL Oral Cavity - Dentition: Adequate natural dentition Orofacial Anatomy: WFL Oral Motor/Sensory Function: Suspected cranial nerve impairment CN VII - Facial: -- (slight facial asymmetry and drooling on the right, head tilted right) CN IX - Glossopharyngeal, CN X - Vagus: Right motor impairment; Left motor impairment (dysphonia)  CN XII -  Hypoglossal: Right motor impairment; Left motor impairment    Anatomy:  Anatomy: WFL   Boluses Administered: Boluses Administered Boluses Administered: Thin liquids (Level 0); Mildly thick liquids (Level 2, nectar thick); Puree; Solid     Oral Impairment Domain: Oral Impairment Domain Lip Closure: Escape from interlabial space or lateral juncture, no extension beyond vermillion border Tongue control during bolus hold: Escape to lateral buccal cavity/floor of mouth Bolus preparation/mastication: Slow prolonged chewing/mashing with complete recollection Bolus transport/lingual motion: Slow tongue motion Oral residue: Trace residue lining oral structures Location of oral residue : Tongue Initiation of pharyngeal swallow : Valleculae; Posterior laryngeal surface of the epiglottis; Pyriform sinuses     Pharyngeal Impairment Domain: Pharyngeal Impairment Domain Soft palate elevation: No bolus between soft palate (SP)/pharyngeal wall (PW) Laryngeal elevation: Partial superior movement of thyroid  cartilage/partial approximation of arytenoids to epiglottic petiole Anterior hyoid excursion: Partial anterior movement Epiglottic movement: Partial inversion Laryngeal vestibule closure: Incomplete, narrow column air/contrast in laryngeal vestibule Pharyngeal stripping wave : Present - complete Pharyngeal contraction (A/P view only): N/A Pharyngoesophageal segment opening: Complete distension and complete duration, no obstruction of flow Tongue base retraction: Narrow column of contrast or air between tongue base and PPW Pharyngeal residue: Trace residue within or on pharyngeal structures; Collection of residue within or on pharyngeal structures Location of pharyngeal residue: Valleculae; Aryepiglottic folds     Esophageal Impairment Domain: Esophageal Impairment Domain Esophageal clearance upright position: -- (could not view secondary to pt in his wheelchair and arm arm blocked the  view)    Pill: Pill Consistency administered: Mildly thick liquids (Level 2, nectar thick); Puree Mildly thick liquids (Level 2, nectar thick): WFL Puree: WFL    Penetration/Aspiration Scale Score: Penetration/Aspiration Scale Score 1.  Material does not enter airway: Mildly thick liquids (Level 2, nectar thick); Puree; Solid; Pill 8.  Material enters airway, passes BELOW cords without attempt by patient to eject out (silent aspiration) : Thin liquids (Level 0) (mixed with secretions)    Compensatory Strategies: Compensatory Strategies Compensatory strategies: Yes Straw: Effective Effective Straw: -- (thin with chin tuck) Effortful swallow: Effective; Ineffective (may have been helpful - difficult to ascertain) Effective Effortful Swallow: Puree Ineffective Effortful Swallow: Puree Chin tuck: Effective Effective Chin Tuck: Thin liquid (Level 0) Liquid wash: Effective Effective Liquid Wash: Puree; Solid       General Information: Caregiver present: No   Diet Prior to this Study: Thin liquids (Level 0)    Temperature : Normal    Respiratory Status: WFL    Supplemental O2: None (Room air)    History of Recent Intubation: No   Behavior/Cognition: Cooperative; Pleasant mood  Self-Feeding Abilities: Needs assist with self-feeding (for MBS for positioning)  Baseline vocal quality/speech: Hypophonia/low volume  Volitional Cough: Able to elicit  Volitional Swallow: Able to elicit  Exam Limitations: Limited visibility; Other (comment) (viewing was often suboptimal)   Goal Planning: No data recorded No data recorded No data recorded No data recorded No data recorded  Pain: Pain Assessment Pain Assessment: No/denies pain    End of Session: Start Time:SLP Start Time (ACUTE ONLY): 1315  Stop Time: SLP Stop Time (ACUTE ONLY): 1345  Time Calculation:SLP Time Calculation (min) (ACUTE ONLY): 30 min  Charges: SLP Evaluations $ SLP Speech Visit: 1  Visit  SLP Evaluations $Outpatient MBS Swallow: 1 Procedure $Swallowing Treatment: 1 Procedure   SLP visit diagnosis: SLP Visit Diagnosis: Dysphagia, oropharyngeal phase (R13.12)    Past Medical History:  Past Medical History:  Diagnosis  Date   Cataract    Colon polyps    Dysphagia    Essential hypertension, benign    Fatty liver    Hyperplasia of prostate    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    Other and unspecified hyperlipidemia    Parkinson disease (HCC)    Schizophrenia (HCC)    Past Surgical History:  Past Surgical History:  Procedure Laterality Date   COLONOSCOPY  02/13/08   COLONOSCOPY N/A 07/10/2012   Procedure: COLONOSCOPY;  Surgeon: Claudis RAYMOND Rivet, MD;  Location: [facility];  Service: Endoscopy;  Laterality: N/A;  225   CYSTOSCOPY WITH INSERTION OF UROLIFT N/A 10/24/2017   Procedure: CYSTOSCOPY WITH INSERTION OF UROLIFT;  Surgeon: Sherrilee Belvie CROME, MD;  Location: [facility] ORS;  Service: Urology;  Laterality: N/A;   UPPER GASTROINTESTINAL ENDOSCOPY  EGD ED   09/09/2010   URETHRAL DILATION  1965   Madelin POUR, MS [facility] SLP Acute Rehab Services Office [phone]  Nicolas Emmie Caldron 10/25/2023, 2:28 PM

## 2023-12-05 ENCOUNTER — Emergency Department (HOSPITAL_COMMUNITY)

## 2023-12-05 ENCOUNTER — Emergency Department (HOSPITAL_COMMUNITY): Admission: EM | Admit: 2023-12-05 | Discharge: 2023-12-05 | Disposition: A | Discharge status: Skilled Nursing Facility

## 2023-12-05 DIAGNOSIS — G459 Transient cerebral ischemic attack, unspecified: Secondary | ICD-10-CM | POA: Insufficient documentation

## 2023-12-05 DIAGNOSIS — F209 Schizophrenia, unspecified: Secondary | ICD-10-CM | POA: Diagnosis not present

## 2023-12-05 DIAGNOSIS — I959 Hypotension, unspecified: Secondary | ICD-10-CM | POA: Diagnosis not present

## 2023-12-05 DIAGNOSIS — K5900 Constipation, unspecified: Secondary | ICD-10-CM

## 2023-12-05 DIAGNOSIS — R9082 White matter disease, unspecified: Secondary | ICD-10-CM | POA: Insufficient documentation

## 2023-12-05 DIAGNOSIS — R55 Syncope and collapse: Secondary | ICD-10-CM | POA: Insufficient documentation

## 2023-12-05 DIAGNOSIS — F0390 Unspecified dementia without behavioral disturbance: Secondary | ICD-10-CM | POA: Diagnosis not present

## 2023-12-05 DIAGNOSIS — G20B1 Parkinson's disease with dyskinesia, without mention of fluctuations: Secondary | ICD-10-CM | POA: Diagnosis not present

## 2023-12-05 DIAGNOSIS — E441 Mild protein-calorie malnutrition: Secondary | ICD-10-CM | POA: Diagnosis not present

## 2023-12-05 DIAGNOSIS — J329 Chronic sinusitis, unspecified: Secondary | ICD-10-CM

## 2023-12-05 DIAGNOSIS — I951 Orthostatic hypotension: Secondary | ICD-10-CM

## 2023-12-05 DIAGNOSIS — I6782 Cerebral ischemia: Secondary | ICD-10-CM

## 2023-12-05 DIAGNOSIS — W19XXXA Unspecified fall, initial encounter: Secondary | ICD-10-CM

## 2023-12-05 DIAGNOSIS — G20A1 Parkinson's disease without dyskinesia, without mention of fluctuations: Secondary | ICD-10-CM | POA: Diagnosis not present

## 2023-12-05 LAB — COMPREHENSIVE METABOLIC PANEL WITH GFR
ALT: 6 U/L (ref 0–44)
AST: 21 U/L (ref 15–41)
Albumin: 3.2 g/dL — ABNORMAL LOW (ref 3.5–5.0)
Alkaline Phosphatase: 47 U/L (ref 38–126)
Anion gap: 10 (ref 5–15)
BUN: 11 mg/dL (ref 8–23)
CO2: 26 mmol/L (ref 22–32)
Calcium: 8.7 mg/dL — ABNORMAL LOW (ref 8.9–10.3)
Chloride: 100 mmol/L (ref 98–111)
Creatinine, Ser: 0.91 mg/dL (ref 0.61–1.24)
GFR, Estimated: >60 mL/min (ref >60)
Glucose, Bld: 120 mg/dL — ABNORMAL HIGH (ref 70–99)
Potassium: 4.3 mmol/L (ref 3.5–5.1)
Sodium: 136 mmol/L (ref 135–145)
Total Bilirubin: 0.5 mg/dL (ref 0.0–1.2)
Total Protein: 6.5 g/dL (ref 6.5–8.1)

## 2023-12-05 LAB — DIFFERENTIAL
Basophils Absolute: 0.1 K/uL (ref 0.0–0.1)
Basophils Relative: 1 %
Eosinophils Absolute: 0.3 K/uL (ref 0.0–0.5)
Eosinophils Relative: 3 %
Lymphocytes Relative: 22 %
Lymphs Abs: 2.1 K/uL (ref 0.7–4.0)
Monocytes Absolute: 1.3 K/uL — ABNORMAL HIGH (ref 0.1–1.0)
Monocytes Relative: 13 %
Neutro Abs: 5.9 K/uL (ref 1.7–7.7)
Neutrophils Relative %: 61 %

## 2023-12-05 LAB — I-STAT CHEM 8, ED
BUN: 12 mg/dL (ref 8–23)
Calcium, Ion: 1.14 mmol/L — ABNORMAL LOW (ref 1.15–1.40)
Chloride: 99 mmol/L (ref 98–111)
Creatinine, Ser: 0.8 mg/dL (ref 0.61–1.24)
Glucose, Bld: 115 mg/dL — ABNORMAL HIGH (ref 70–99)
HCT: 34 % — ABNORMAL LOW (ref 39.0–52.0)
Hemoglobin: 11.6 g/dL — ABNORMAL LOW (ref 13.0–17.0)
Potassium: 4.4 mmol/L (ref 3.5–5.1)
Sodium: 138 mmol/L (ref 135–145)
TCO2: 29 mmol/L (ref 22–32)

## 2023-12-05 LAB — CBC
HCT: 35.8 % — ABNORMAL LOW (ref 39.0–52.0)
Hemoglobin: 11.4 g/dL — ABNORMAL LOW (ref 13.0–17.0)
MCH: 32.1 pg (ref 26.0–34.0)
MCHC: 31.8 g/dL (ref 30.0–36.0)
MCV: 100.8 fL — ABNORMAL HIGH (ref 80.0–100.0)
Platelets: 224 K/uL (ref 150–400)
RBC: 3.55 MIL/uL — ABNORMAL LOW (ref 4.22–5.81)
RDW: 12.1 % (ref 11.5–15.5)
WBC: 9.7 K/uL (ref 4.0–10.5)
nRBC: 0 % (ref 0.0–0.2)

## 2023-12-05 LAB — URINALYSIS, ROUTINE W REFLEX MICROSCOPIC
Bilirubin Urine: NEGATIVE
Glucose, UA: NEGATIVE mg/dL
Hgb urine dipstick: NEGATIVE
Ketones, ur: NEGATIVE mg/dL
Leukocytes,Ua: NEGATIVE
Nitrite: NEGATIVE
Protein, ur: NEGATIVE mg/dL
Specific Gravity, Urine: 1.006 (ref 1.005–1.030)
pH: 7 (ref 5.0–8.0)

## 2023-12-05 LAB — ETHANOL: Alcohol, Ethyl (B): <15 mg/dL (ref <15)

## 2023-12-05 LAB — PROTIME-INR
INR: 1 (ref 0.8–1.2)
Prothrombin Time: 14.2 s (ref 11.4–15.2)

## 2023-12-05 LAB — APTT: aPTT: 32 s (ref 24–36)

## 2023-12-05 LAB — CBG MONITORING, ED: Glucose-Capillary: 110 mg/dL — ABNORMAL HIGH (ref 70–99)

## 2023-12-05 MED ORDER — SODIUM CHLORIDE 0.9% FLUSH
3.0000 mL | Freq: Once | INTRAVENOUS | Status: AC
  Administered 2023-12-05: 3 mL via INTRAVENOUS

## 2023-12-05 MED ORDER — LACTATED RINGERS IV BOLUS
1000.0000 mL | Freq: Once | INTRAVENOUS | Status: AC
Start: 2023-12-05 — End: 2023-12-05
  Administered 2023-12-05: 1000 mL via INTRAVENOUS

## 2023-12-05 NOTE — Hospital Course (Addendum)
 Active Problems:   Syncope   Parkinson disease (HCC)   Chronic cerebral ischemia   Fall   Constipation  Resolved Problems:   * No resolved hospital problems. *  Consults:***  Procedures:***  Follow-up items:***  63 year old male with Parkinson's disease presents after fall at long-term care facility, we're consulted for syncope evaluation.  Active Problems:   Syncope History supports orthostatic or vasovagal hypotension leading to syncope. He's back to neurologic baseline. Labs normal. Brain imaging normal for age and clinical history. Low index for cardiac syncope. He didn't have a stroke or seizure. His Parkinsonism puts him at risk for orthostatic changes. He may have had vasovagal episode during defecation given his constipation of late. Recommend EKG, if normal, admission for further workup not necessary. TED hose and/or abdominal binders can be considered. Encourage adequate fluid intake.    Parkinson disease (HCC) Chronic, progressive. Very functionally limited, wheelchair dependent at baseline. Risk factor for orthostatic hypotension.     Chronic cerebral ischemia Incidental finding on brain imaging. Suggestive of underlying cerebrovascular disease. He's on outpatient rosuvastatin .    Fall No injury, specifically no intracerebral hemorrhage.    Constipation Chronic, secondary to Parkinson's. Continue outpatient laxatives.

## 2023-12-05 NOTE — Discharge Instructions (Signed)
Please follow up with your doctor and return to the ER for worsening symptoms.

## 2023-12-05 NOTE — Consult Note (Addendum)
 Date: 12/05/2023               Patient Name:  Mark Benjamin MRN: [id number]  DOB: Jul 04, 1960 Age / Sex: 63 y.o., male   PCP: Mark Kerney SQUIBB, MD         Requesting Physician: Dr. Ula Prentice JONELLE, MD    Consulting Reason:  Syncope      Chief Complaint: syncope, left sided facial droop  History of Present Illness: Mark Benjamin is a 63 year old male with past medical history of Parkinson's disease, schizophrenia that presents with left-sided facial droop noticed after syncopal episode.  Per nurse before this incident patient was having breakfast and drinking his Dr. Nunzio.patient went to the restroom and when trying to get off the toilet he syncopized.  Per nurse patient was noticed to be drooling more than normal, speech was different than baseline per facility.  Patient states that he has had falls in the past before.  Patient states that at time of evaluation he is not in any pain.  He does believe that he was lightheaded and dizzy before this fall.Sister is at bedside and states at time of evaluation that patient seems to be back to baseline.  She does note that the week before he had been constipated not had a bowel movement for about a week.  Patient reports that on Friday he was given some laxatives and he was able to have a bowel movement.  Steroid also reports that patient had not been eating much in the last week.  He also had some nausea yesterday and was reportedly not eating much.  The patient states that he breakfast this morning.  Meds: No current facility-administered medications for this encounter.   Current Outpatient Medications  Medication Sig Dispense Refill   acetaminophen  (TYLENOL ) 325 MG tablet Take 650 mg by mouth every 6 (six) hours as needed for mild pain (pain score 1-3).     benztropine (COGENTIN) 1 MG tablet Take 1 mg by mouth 2 (two) times daily.      carbidopa -levodopa  (SINEMET  IR) 25-100 MG tablet 1 tab at 8am, 1 tab at 2pm, 1.5 tabs at 8pm (Patient taking differently:  Take 1-1.5 tablets by mouth in the morning and at bedtime. 1 tab at 8am and 1.5 tabs at 8pm) 120 tablet 12   cholecalciferol (VITAMIN D3) 25 MCG (1000 UNIT) tablet Take 3,000 Units by mouth daily.     diclofenac Sodium (VOLTAREN) 1 % GEL Apply 2 g topically daily as needed (pain). Lower back pain     divalproex  (DEPAKOTE ) 250 MG DR tablet Take 250 mg by mouth 3 (three) times daily.     docusate sodium  (COLACE) 100 MG capsule Take 100 mg by mouth 2 (two) times daily.     famotidine (PEPCID) 20 MG tablet Take 20 mg by mouth 2 (two) times daily.     gabapentin  (NEURONTIN ) 100 MG capsule Take 200 mg by mouth at bedtime.     glycopyrrolate  (ROBINUL ) 1 MG tablet Take 1 tablet (1 mg total) by mouth 2 (two) times daily. 60 tablet 5   Lactobacillus (PROBIOTIC ACIDOPHILUS PO) Take 1 capsule by mouth daily.     magnesium hydroxide (MILK OF MAGNESIA) 400 MG/5ML suspension Take 30 mLs by mouth 2 (two) times daily as needed for mild constipation.     Multiple Vitamin (MULTIVITAMIN WITH MINERALS) TABS tablet Take 1 tablet by mouth daily.     polyethylene glycol (MIRALAX / GLYCOLAX) 17 g packet Take 17 g by  mouth daily.     risperiDONE  (RISPERDAL ) 1 MG tablet Take 2.5 mg by mouth at bedtime. Along with the 2 mg tablet to equal 2.5 mg QHS     risperiDONE  (RISPERDAL ) 2 MG tablet Take 2 mg by mouth in the morning.     sertraline (ZOLOFT) 100 MG tablet Take 100 mg by mouth daily.     silodosin  (RAPAFLO ) 8 MG CAPS capsule Take 1 capsule (8 mg total) by mouth daily with breakfast. 30 capsule 11   anti-nausea (EMETROL) solution Take 30 mLs by mouth every 15 (fifteen) minutes as needed for nausea or vomiting. For 4 doses in 1 hour. (Patient not taking: Reported on 12/05/2023)     rosuvastatin  (CRESTOR ) 5 MG tablet Take 0.5 tablets (2.5 mg total) by mouth every other day. Take 1/2 tablet on Monday, Wednesday and Friday (Patient not taking: Reported on 12/05/2023) 45 tablet 3    Allergies: Allergies as of 12/05/2023 -  Review Complete 12/05/2023  Allergen Reaction Noted   Erythromycin Other (See Comments) 06/29/2010   Penicillins Other (See Comments) 12/05/2019   Sulfamethoxazole -trimethoprim  Other (See Comments) 04/14/2015   Zocor [simvastatin] Other (See Comments) 06/29/2010   Amoxicillin Other (See Comments) 02/11/2014   Past Medical History:  Diagnosis Date   Cataract    Colon polyps    Dysphagia    Essential hypertension, benign    Fatty liver    Hyperplasia of prostate    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    Other and unspecified hyperlipidemia    Parkinson disease (HCC)    Schizophrenia (HCC)    Past Surgical History:  Procedure Laterality Date   COLONOSCOPY  02/13/08   COLONOSCOPY N/A 07/10/2012   Procedure: COLONOSCOPY;  Surgeon: Mark RAYMOND Rivet, MD;  Location: [facility];  Service: Endoscopy;  Laterality: N/A;  225   CYSTOSCOPY WITH INSERTION OF UROLIFT N/A 10/24/2017   Procedure: CYSTOSCOPY WITH INSERTION OF UROLIFT;  Surgeon: Mark Belvie CROME, MD;  Location: [facility];  Service: Urology;  Laterality: N/A;   UPPER GASTROINTESTINAL ENDOSCOPY  [facility] ED   09/09/2010   URETHRAL DILATION  1965   Family History  Problem Relation Age of Onset   Heart failure Mother    ALS Father    Hyperlipidemia Sister    Inflammatory bowel disease Paternal Grandfather    Social History   Socioeconomic History   Marital status: Single    Spouse name: Not on file   Number of children: 0   Years of education: 12th   Highest education level: High school graduate  Occupational History    Employer: NOT EMPLOYED  Tobacco Use   Smoking status: Never   Smokeless tobacco: Never  Vaping Use   Vaping status: Never Used  Substance and Sexual Activity   Alcohol  use: No   Drug use: No   Sexual activity: Not Currently    Birth control/protection: None  Other Topics Concern   Not on file  Social History Narrative   07/21/21 Patient lives at [facility], which is an assisted  living facility. His sister helps with his care. He has never been married and does not have children.    Social Drivers of Corporate investment banker Strain: Low Risk  (05/04/2017)   Overall Financial Resource Strain (CARDIA)    Difficulty of Paying Living Expenses: Not hard at all  Food Insecurity: Patient Unable To Answer (10/01/2023)   Hunger Vital Sign    Worried About Running Out of  Food in the Last Year: Patient unable to answer    Ran Out of Food in the Last Year: Patient unable to answer  Transportation Needs: Patient Unable To Answer (10/01/2023)   PRAPARE - Transportation    Lack of Transportation (Medical): Patient unable to answer    Lack of Transportation (Non-Medical): Patient unable to answer  Physical Activity: Unknown (08/30/2018)   Exercise Vital Sign    Days of Exercise per Week: Not on file    Minutes of Exercise per Session: 10 min  Stress: Stress Concern Present (05/04/2017)   [employer] of Occupational Health - Occupational Stress Questionnaire    Feeling of Stress : To some extent  Social Connections: Unknown (08/30/2018)   Social Connection and Isolation Panel    Frequency of Communication with Friends and Family: Not on file    Frequency of Social Gatherings with Friends and Family: Not on file    Attends Religious Services: Never    Active Member of Clubs or Organizations: Not on file    Attends Banker Meetings: Not on file    Marital Status: Not on file  Intimate Partner Violence: Patient Unable To Answer (10/01/2023)   Humiliation, Afraid, Rape, and Kick questionnaire    Fear of Current or Ex-Partner: Patient unable to answer    Emotionally Abused: Patient unable to answer    Physically Abused: Patient unable to answer    Sexually Abused: Patient unable to answer    Review of Systems: Pertinent items are noted in HPI.  Physical Exam: Blood pressure 127/65, pulse (!) 50, temperature 97.7 F (36.5 C), temperature source Oral, resp.  rate 11, SpO2 100%. BP 127/65   Pulse (!) 50   Temp 97.7 F (36.5 C) (Oral)   Resp 11   SpO2 100%  Physical Exam Constitutional:      Comments: Elderly, chronically ill appearing male   Eyes:     Comments: Pupils were reactive to light bilaterally  Cardiovascular:     Rate and Rhythm: Normal rate and regular rhythm.  Pulmonary:     Effort: Pulmonary effort is normal. No respiratory distress.     Breath sounds: No wheezing.  Abdominal:     General: Abdomen is flat. Bowel sounds are normal. There is no distension.     Palpations: Abdomen is soft.     Tenderness: There is no abdominal tenderness.  Musculoskeletal:     Right lower leg: No edema.     Left lower leg: No edema.  Neurological:     Mental Status: He is alert.     Comments: Sensation intact in lower extremities Facial muscles are intact. Do note mild left sided facial droop  No tongue deviation  Cogwheel rigidity noted bilaterally with left hand fingers contracted       Lab results: @LABTEST @  Imaging results:  DG Chest Portable 1 View Result Date: 12/05/2023 EXAM: 1 VIEW XRAY OF THE CHEST 12/05/2023 12:44:00 PM COMPARISON: 10/01/2023 CLINICAL HISTORY: Hypotension. Hypotensive after fall and hitting head. FINDINGS: LUNGS AND PLEURA: No focal pulmonary opacity. No pulmonary edema. No pleural effusion. No pneumothorax. HEART AND MEDIASTINUM: No acute abnormality of the cardiac and mediastinal silhouettes. Aortic atherosclerotic calcification. BONES AND SOFT TISSUES: No acute osseous abnormality. IMPRESSION: 1. No acute cardiopulmonary pathology related to hypotension or fall. Electronically signed by: Donnice Mania MD 12/05/2023 12:58 PM EDT RP Workstation: [id number]   MR BRAIN WO CONTRAST Result Date: 12/05/2023 EXAM: MRI BRAIN WITHOUT CONTRAST 12/05/2023 11:19:46 AM TECHNIQUE: Multiplanar multisequence  MRI of the head/brain was performed without the administration of intravenous contrast. COMPARISON: Same day head CT.  MRI head 11/29/12. CLINICAL HISTORY: Neuro deficit, acute, stroke suspected. FINDINGS: BRAIN AND VENTRICLES: No acute infarct. No intracranial hemorrhage. No mass. No midline shift. No hydrocephalus. The sella is unremarkable. Normal flow voids. There are mild foci of T2/FLAIR hyperintensity in the periventricular and subcortical white matter, likely related to chronic microvascular ischemic changes. ORBITS: No acute abnormality. SINUSES AND MASTOIDS: Mucosal thickening in the left ethmoid and left maxillary sinuses. Air-fluid level in the left maxillary sinus possibly related to acute sinusitis. BONES AND SOFT TISSUES: Normal marrow signal. No acute soft tissue abnormality. IMPRESSION: 1. No acute intracranial abnormality. 2. Mild chronic microvascular ischemic changes. 3. Air-fluid level in the left maxillary sinus, possibly related to acute sinusitis. Electronically signed by: Donnice Mania MD 12/05/2023 11:54 AM EDT RP Workstation: [id number]   CT HEAD CODE STROKE WO CONTRAST Result Date: 12/05/2023 EXAM: CT HEAD WITHOUT CONTRAST 12/05/2023 10:07:23 AM TECHNIQUE: CT of the head was performed without the administration of intravenous contrast. Automated exposure control, iterative reconstruction, and/or weight based adjustment of the mA/kV was utilized to reduce the radiation dose to as low as reasonably achievable. COMPARISON: MRI of the head dated 11/29/2012. CLINICAL HISTORY: Neuro deficit, acute, stroke suspected. FINDINGS: BRAIN AND VENTRICLES: No acute hemorrhage. No evidence of acute infarct. No hydrocephalus. No extra-axial collection. No mass effect or midline shift. Mild periventricular white matter disease present. [place] stroke program early CT (aspect) score Ganglionic (caudate, ic, Lentiform Nucleus, insula, M1-m3): 7 Supraganglionic (m4-m6): 3 Total: 10 ORBITS: No acute abnormality. SINUSES: Mucosal disease within the left maxillary sinus. SOFT TISSUES AND SKULL: No acute soft tissue abnormality.  No skull fracture. The above findings were communicated to Dr. Voncile at 10:09 AM on 12/05/2023. IMPRESSION: 1. No acute intracranial abnormality. 2. Mild periventricular white matter disease. 3. ASPECT score: 10. Electronically signed by: Evalene Coho MD 12/05/2023 10:13 AM EDT RP Workstation: [id number]    Other results: EKG: normal EKG, normal sinus rhythm, unchanged from previous tracings, prolonged QT interval.  Assessment, Plan, & Recommendations by Problem: Active Problems:   Parkinson disease (HCC)   Chronic cerebral ischemia   Syncope   Fall   Constipation  Mr. Rybarczyk is a 63 year old male with past medical history of Parkinson's disease, schizophrenia that presents with left-sided facial droop noticed after syncopal episode and code stroke was called in the ED imaging was found to be negative for stroke patient was being brought in for syncope workup.   #Syncope Patient had syncopal episode when getting up off of the toilet today at his facility.  Blood pressures were noted to be SBP of 80 and patient was given 1L bolus and systolic went up appropriately per EMS  This episode likely seems like vasovagal syncope or orthostatic hypotension.  As far as syncope goes patient has a higher risk for this due to his history of Parkinson's disease which can cause dysautonomia and orthostatic hypotension. He also could have had vasovagal episode on the toilet that could have contributed to patient's presentation. Patient's head imaging on CT and MRI did not show any cute acute concern for stroke or hemorrhage. CXR, UA are negative for infection. Kidney function and electrolytes are WNL. We have lower differential for cardiac syncope as EKG is unchanged from baseline besides some mild QT prolongation. As we have likely cause for patient's syncope do not feel that admission is warranted at this time with a lack of  reversible causes that need to be treated in the hospital. Encourage TED hose, abdominal  binder and PO intake to help upon discharge    #Left-sided facial droop #Code stroke rule out Patient had syncopal episode today and was noted to have left-sided facial droop after this.  He also hit his head during this fall.  When patient arrived in the ED code stroke was called and only deficit noticed was his left-sided facial droop.  On our exam left-sided facial droop was noted otherwise normal neuroexam with intact sensation extremities facial muscles intact, pupils reactive to light and no upper extremity pronator drift noted.  Imaging was done and CT head was negative for acute intracranial abnormality and MRI showed no acute intracranial abnormality and mild chronic microvascular ischemic changes.  Neurology saw patient and stated we do not believe this is stroke at this time. At the time of examination patient also was deemed back to baseline by his sister who was at bedside. No further inpatient physical neuro standpoint.   #Parkinson's Disease  Chronic.  Patient is on carbidopa  levodopa  compliant with this medication.  Patient is in symptom care facility and is only able to mobilize using wheelchair usually and is dependent for his ADLs.  Is also a risk factor for his orthostatic hypotension and dysautonomia. Did discuss with patient's sister that GOC discussion is encouraged with facility for when patient should and shouldn't come to hospital in the future and patient's sister was agreeable to this plan  #Fall Intracranial hemorrhage noted on CT head or MRI.  No bruising noted to patient's head on physical exam either.  Patient has a history of falls before and is mostly wheelchair bound to be able to get around.  Per sister he needs assistance of 2 people usually if he is able to stand up at all.  #Constipation Patient gets laxatives at his facility to help him with bowel movements.  Patient has decreased mobility that could be contributing to his constipation.  Continue  laxatives.     Signed: D'Mello, Chavis Tessler, DO 12/05/2023, 3:59 PM

## 2023-12-05 NOTE — ED Provider Notes (Addendum)
 [place] EMERGENCY DEPARTMENT AT [facility] Provider Note   CSN: [id number] Arrival date & time: 12/05/23  9041  An emergency department physician performed an initial assessment on this suspected stroke patient at 1000.  Patient presents with: No chief complaint on file.   Mark Benjamin is a 63 y.o. male.   63 y.o. male past history of Parkinson's disease, dementia, schizophrenia, presenting for evaluation of left facial droop which was noticed after syncopal episode.  He was noted to have hypotension with systolic blood pressure in the 80s after he syncopized getting off the toilet.  His last known normal was 9 AM.  The patient denies any headache.  No significant weakness was noted.  No witnessed seizure activity was noted.         Prior to Admission medications   Medication Sig Start Date End Date Taking? Authorizing Provider  acetaminophen  (TYLENOL ) 325 MG tablet Take 650 mg by mouth every 6 (six) hours as needed for mild pain (pain score 1-3).   Yes [provider]  benztropine (COGENTIN) 1 MG tablet Take 1 mg by mouth 2 (two) times daily.  03/13/14  Yes [provider]  carbidopa -levodopa  (SINEMET  IR) 25-100 MG tablet 1 tab at 8am, 1 tab at 2pm, 1.5 tabs at 8pm Patient taking differently: Take 1-1.5 tablets by mouth in the morning and at bedtime. 1 tab at 8am and 1.5 tabs at 8pm 06/25/20  Yes Penumalli, Vikram R, MD  cholecalciferol (VITAMIN D3) 25 MCG (1000 UNIT) tablet Take 3,000 Units by mouth daily.   Yes [provider]  diclofenac Sodium (VOLTAREN) 1 % GEL Apply 2 g topically daily as needed (pain). Lower back pain   Yes [provider]  divalproex  (DEPAKOTE ) 250 MG DR tablet Take 250 mg by mouth 3 (three) times daily. 03/13/14  Yes [provider]  docusate sodium  (COLACE) 100 MG capsule Take 100 mg by mouth 2 (two) times daily.   Yes [provider]  famotidine (PEPCID) 20 MG tablet Take 20 mg by mouth 2  (two) times daily.   Yes [provider]  gabapentin  (NEURONTIN ) 100 MG capsule Take 200 mg by mouth at bedtime. 07/09/21  Yes [provider]  glycopyrrolate  (ROBINUL ) 1 MG tablet Take 1 tablet (1 mg total) by mouth 2 (two) times daily. 07/22/21  Yes Penumalli, Vikram R, MD  Lactobacillus (PROBIOTIC ACIDOPHILUS PO) Take 1 capsule by mouth daily.   Yes [provider]  magnesium hydroxide (MILK OF MAGNESIA) 400 MG/5ML suspension Take 30 mLs by mouth 2 (two) times daily as needed for mild constipation.   Yes [provider]  Multiple Vitamin (MULTIVITAMIN WITH MINERALS) TABS tablet Take 1 tablet by mouth daily.   Yes [provider]  polyethylene glycol (MIRALAX / GLYCOLAX) 17 g packet Take 17 g by mouth daily.   Yes [provider]  risperiDONE  (RISPERDAL ) 1 MG tablet Take 2.5 mg by mouth at bedtime. Along with the 2 mg tablet to equal 2.5 mg QHS 06/22/22  Yes [provider]  risperiDONE  (RISPERDAL ) 2 MG tablet Take 2 mg by mouth in the morning.   Yes [provider]  sertraline (ZOLOFT) 100 MG tablet Take 100 mg by mouth daily. 09/13/23  Yes [provider]  silodosin  (RAPAFLO ) 8 MG CAPS capsule Take 1 capsule (8 mg total) by mouth daily with breakfast. 09/08/22  Yes McKenzie, Belvie CROME, MD  anti-nausea (EMETROL) solution Take 30 mLs by mouth every 15 (fifteen) minutes  as needed for nausea or vomiting. For 4 doses in 1 hour. Patient not taking: Reported on 12/05/2023    [provider]  rosuvastatin  (CRESTOR ) 5 MG tablet Take 0.5 tablets (2.5 mg total) by mouth every other day. Take 1/2 tablet on Monday, Wednesday and Friday Patient not taking: Reported on 12/05/2023 11/30/17   Georgina Nancyann ORN, MD    Allergies: Erythromycin, Penicillins, Sulfamethoxazole -trimethoprim , Zocor [simvastatin], and Amoxicillin    Review of Systems  Neurological:        L sided facial weakness  All other systems reviewed and are  negative.   Updated Vital Signs BP 127/65   Pulse (!) 50   Temp (!) 97.5 F (36.4 C) (Oral)   Resp 11   SpO2 100%   Physical Exam Vitals and nursing note reviewed.   Gen: NAD Eyes: PERRL, EOMI HEENT: no oropharyngeal swelling Neck: trachea midline Resp: clear to auscultation bilaterally Card: RRR, no murmurs, rubs, or gallops Abd: nontender, nondistended Extremities: no calf tenderness, no edema Vascular: 2+ radial pulses bilaterally, 2+ DP pulses bilaterally Neuro: NIH stroke scale of 1 for mild L facial droop Skin: no rashes Psyc: acting appropriately   (all labs ordered are listed, but only abnormal results are displayed) Labs Reviewed  CBC - Abnormal; Notable for the following components:      Result Value   RBC 3.55 (*)    Hemoglobin 11.4 (*)    HCT 35.8 (*)    MCV 100.8 (*)    All other components within normal limits  DIFFERENTIAL - Abnormal; Notable for the following components:   Monocytes Absolute 1.3 (*)    All other components within normal limits  COMPREHENSIVE METABOLIC PANEL WITH GFR - Abnormal; Notable for the following components:   Glucose, Bld 120 (*)    Calcium  8.7 (*)    Albumin 3.2 (*)    All other components within normal limits  I-STAT CHEM 8, ED - Abnormal; Notable for the following components:   Glucose, Bld 115 (*)    Calcium , Ion 1.14 (*)    Hemoglobin 11.6 (*)    HCT 34.0 (*)    All other components within normal limits  CBG MONITORING, ED - Abnormal; Notable for the following components:   Glucose-Capillary 110 (*)    All other components within normal limits  PROTIME-INR  APTT  ETHANOL  URINALYSIS, ROUTINE W REFLEX MICROSCOPIC    EKG: None  Radiology: DG Chest Portable 1 View Result Date: 12/05/2023 EXAM: 1 VIEW XRAY OF THE CHEST 12/05/2023 12:44:00 PM COMPARISON: 10/01/2023 CLINICAL HISTORY: Hypotension. Hypotensive after fall and hitting head. FINDINGS: LUNGS AND PLEURA: No focal pulmonary opacity. No pulmonary edema.  No pleural effusion. No pneumothorax. HEART AND MEDIASTINUM: No acute abnormality of the cardiac and mediastinal silhouettes. Aortic atherosclerotic calcification. BONES AND SOFT TISSUES: No acute osseous abnormality. IMPRESSION: 1. No acute cardiopulmonary pathology related to hypotension or fall. Electronically signed by: Donnice Mania MD 12/05/2023 12:58 PM EDT RP Workstation: [id number]   MR BRAIN WO CONTRAST Result Date: 12/05/2023 EXAM: MRI BRAIN WITHOUT CONTRAST 12/05/2023 11:19:46 AM TECHNIQUE: Multiplanar multisequence MRI of the head/brain was performed without the administration of intravenous contrast. COMPARISON: Same day head CT. MRI head 11/29/12. CLINICAL HISTORY: Neuro deficit, acute, stroke suspected. FINDINGS: BRAIN AND VENTRICLES: No acute infarct. No intracranial hemorrhage. No mass. No midline shift. No hydrocephalus. The sella is unremarkable. Normal flow voids. There are mild foci of T2/FLAIR hyperintensity in the periventricular and subcortical white matter, likely related to chronic microvascular  ischemic changes. ORBITS: No acute abnormality. SINUSES AND MASTOIDS: Mucosal thickening in the left ethmoid and left maxillary sinuses. Air-fluid level in the left maxillary sinus possibly related to acute sinusitis. BONES AND SOFT TISSUES: Normal marrow signal. No acute soft tissue abnormality. IMPRESSION: 1. No acute intracranial abnormality. 2. Mild chronic microvascular ischemic changes. 3. Air-fluid level in the left maxillary sinus, possibly related to acute sinusitis. Electronically signed by: Donnice Mania MD 12/05/2023 11:54 AM EDT RP Workstation: [id number]   CT HEAD CODE STROKE WO CONTRAST Result Date: 12/05/2023 EXAM: CT HEAD WITHOUT CONTRAST 12/05/2023 10:07:23 AM TECHNIQUE: CT of the head was performed without the administration of intravenous contrast. Automated exposure control, iterative reconstruction, and/or weight based adjustment of the mA/kV was utilized to reduce the  radiation dose to as low as reasonably achievable. COMPARISON: MRI of the head dated 11/29/2012. CLINICAL HISTORY: Neuro deficit, acute, stroke suspected. FINDINGS: BRAIN AND VENTRICLES: No acute hemorrhage. No evidence of acute infarct. No hydrocephalus. No extra-axial collection. No mass effect or midline shift. Mild periventricular white matter disease present. [place] stroke program early CT (aspect) score Ganglionic (caudate, ic, Lentiform Nucleus, insula, M1-m3): 7 Supraganglionic (m4-m6): 3 Total: 10 ORBITS: No acute abnormality. SINUSES: Mucosal disease within the left maxillary sinus. SOFT TISSUES AND SKULL: No acute soft tissue abnormality. No skull fracture. The above findings were communicated to Dr. Voncile at 10:09 AM on 12/05/2023. IMPRESSION: 1. No acute intracranial abnormality. 2. Mild periventricular white matter disease. 3. ASPECT score: 10. Electronically signed by: Evalene Coho MD 12/05/2023 10:13 AM EDT RP Workstation: [id number]     Procedures   Medications Ordered in the ED  sodium chloride  flush (NS) 0.9 % injection 3 mL (3 mLs Intravenous Given 12/05/23 1003)  lactated ringers  bolus 1,000 mL (1,000 mLs Intravenous New Bag/Given 12/05/23 1415)                                    Medical Decision Making 63 year old male with past medical history of hypertension and schizophrenia as well as Parkinson's and dementia presenting to the emergency department today with left-sided facial droop.  Patient was a code stroke due to short duration of symptoms with symptom onset.  The patient was valuated with CT scan which was unremarkable.  MRI is ordered to evaluate for stroke but no TNK is administered as the patient has very minimal symptoms now.  Plan is for MRI and for further evaluation with a chest x-ray and urinalysis to evaluate for cause for possible hypotension.    The patient's workup here is unremarkable.  He has borderline hypotensive here on reassessment.  No signs of  infection here.  Given these findings and his symptoms calls placed the hospitalist service for admission.  I did try to get in touch with the patient's power of attorney multiple times to discuss goals of care and to determine whether she would want him admitted but I was unsuccessful getting in touch after multiple attempts.  While the patient was being evaluated for possible admission the patient's sister arrived and does not think that she wants the patient admitted.  The internal medicine residents will discuss the patient's case with their attending and if they are comfortable with discharge the patient will likely be discharged.  CRITICAL CARE Performed by: Prentice JONELLE Medicus   Total critical care time: 35 minutes  Critical care time was exclusive of separately billable procedures and treating other patients.  Critical care was necessary to treat or prevent imminent or life-threatening deterioration.  Critical care was time spent personally by me on the following activities: development of treatment plan with patient and/or surrogate as well as nursing, discussions with consultants, evaluation of patient's response to treatment, examination of patient, obtaining history from patient or surrogate, ordering and performing treatments and interventions, ordering and review of laboratory studies, ordering and review of radiographic studies, pulse oximetry and re-evaluation of patient's condition.   Amount and/or Complexity of Data Reviewed Labs: ordered. Radiology: ordered.  Risk Decision regarding hospitalization.        Final diagnoses:  Syncope, unspecified syncope type  TIA (transient ischemic attack)    ED Discharge Orders     None          Ula Prentice SAUNDERS, MD 12/05/23 1432    Ula Prentice SAUNDERS, MD 12/05/23 [phone]

## 2023-12-05 NOTE — ED Triage Notes (Signed)
 Pt BIB [facility] from [facility] following a fall. Pt was being assisted off the toilet, fell hit L side of head. Left side facial droop immediately afterwards. LKW 0900 today 09/15. Initial systolic per EMS 80, given 1000 mL bolus, systolic up to 100. DNR. VSS

## 2023-12-05 NOTE — ED Notes (Signed)
 Inocente Perfect (Sister) called asking to be called to see if PT is going to be admitted. Her number is [phone]

## 2023-12-05 NOTE — ED Notes (Signed)
 Called ptar for pt pick up

## 2023-12-05 NOTE — Code Documentation (Signed)
 Stroke Response Nurse Documentation Code Documentation  Mark Benjamin is a 63 y.o. male arriving to [facility]  via [facility] on 12/05/2023 with past medical hx of PD. On No antithrombotic. Code stroke was activated by EMS.   Patient from Facility where he was LKW at 0900 and now complaining of Lt droop.   Stroke team at the bedside on patient arrival. Labs drawn and patient cleared for CT by Dr. Ula. Patient to CT with team. NIHSS 1, see documentation for details and code stroke times. Patient with left facial droop on exam. The following imaging was completed:  CT Head and MRI. Patient is not a candidate for IV Thrombolytic due to too mild, stroke not suspected. Patient is not a candidate for IR due to too mild, no LVO symptoms.   Care Plan: q 2 NIHSS and VS for 12 then q 4. NPO until stroke swallow screen completed.    Bedside handoff with ED RN Mark Benjamin.    Mark Benjamin  Stroke Response RN

## 2023-12-05 NOTE — Consult Note (Signed)
 NEUROLOGY CONSULT NOTE   Date of service: December 05, 2023 Patient Name: Mark Benjamin MRN:  [id number] DOB:  16-Apr-1960 Chief Complaint: Code stroke-left-sided weakness Requesting Provider: Ula Prentice JONELLE, MD  History of Present Illness  Mark Benjamin is a 63 y.o. male with hx of Parkinson's, dementia, schizophrenia, presenting to the emergency department from his assisted living facility where he was noted to have a syncopal episode. Last known well was 9 AM. He was on the toilet and staff was assisting him when he had a syncopal episode-did not fall to the ground but did hit his head on the wall on the right side of the head.  It was then noted that he had mild left lower lip drooping-for which a code stroke was activated. He was initially hypotensive with systolic blood pressure in the 80s.  He received 1 L of fluids and his systolic was in the 120s. He was evaluated emergently in the ER-see detailed exam below.  LKW: 0900 Modified rankin score: 4-Needs assistance to walk and tend to bodily needs IV Thrombolysis: Too mild to treat EVT: No - poor mRS, exam not consistent with LVO  NIHSS components Score: Comment  1a Level of Conscious 0[x]  1[]  2[]  3[]      1b LOC Questions 0[x]  1[]  2[]       1c LOC Commands 0[x]  1[]  2[]       2 Best Gaze 0[x]  1[]  2[]       3 Visual 0[x]  1[]  2[]  3[]      4 Facial Palsy 0[]  1[x]  2[]  3[]      5a Motor Arm - left 0[x]  1[]  2[]  3[]  4[]  UN[]    5b Motor Arm - Right 0[x]  1[]  2[]  3[]  4[]  UN[]    6a Motor Leg - Left 0[x]  1[]  2[]  3[]  4[]  UN[]    6b Motor Leg - Right 0[x]  1[]  2[]  3[]  4[]  UN[]    7 Limb Ataxia 0[x]  1[]  2[]  UN[]      8 Sensory 0[x]  1[]  2[]  UN[]      9 Best Language 0[x]  1[]  2[]  3[]      10 Dysarthria 0[]  1[]  2[x]  UN[]      11 Extinct. 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    Other and unspecified hyperlipidemia    Parkinson disease (HCC)    Schizophrenia (HCC)     Past Surgical History:  Procedure Laterality Date   COLONOSCOPY  02/13/08   COLONOSCOPY N/A 07/10/2012   Procedure: COLONOSCOPY;  Surgeon: Claudis RAYMOND Rivet, MD;  Location: [facility];  Service: Endoscopy;  Laterality: N/A;  225   CYSTOSCOPY WITH INSERTION OF UROLIFT N/A 10/24/2017   Procedure: CYSTOSCOPY WITH INSERTION OF UROLIFT;  Surgeon: Sherrilee Belvie CROME, MD;  Location: [facility] ORS;  Service: Urology;  Laterality: N/A;   UPPER GASTROINTESTINAL ENDOSCOPY  [facility] ED   09/09/2010   URETHRAL DILATION  1965    Family History: Family History  Problem Relation Age of Onset   Heart failure Mother    ALS Father    Hyperlipidemia Sister    Inflammatory bowel disease Paternal Grandfather  Social History  reports that he has never smoked. He has never used smokeless tobacco. He reports that he does not drink alcohol  and does not use drugs.  Allergies  Allergen Reactions   Erythromycin Other (See Comments)    Unknown    Penicillins Other (See Comments)    Unknown    Sulfamethoxazole -Trimethoprim  Other (See Comments)    Extreme weakness/lethargy   Zocor [Simvastatin] Other (See Comments)    Unknown    Amoxicillin Other (See Comments)    Dizzy and blurred vision    Medications   Current Facility-Administered Medications:    sodium chloride  flush (NS) 0.9 % injection 3 mL, 3 mL, Intravenous, Once, Ula Prentice SAUNDERS, MD  Current Outpatient Medications:    acetaminophen  (TYLENOL ) 325 MG tablet, Take 650 mg by mouth every 6 (six) hours as needed for mild pain (pain score 1-3)., Disp: , Rfl:    anti-nausea (EMETROL) solution, Take 30 mLs by mouth every 15 (fifteen) minutes as needed for nausea or vomiting. For 4 doses in 1 hour., Disp: , Rfl:     benztropine (COGENTIN) 1 MG tablet, Take 1 mg by mouth 2 (two) times daily. , Disp: , Rfl:    carbidopa -levodopa  (SINEMET  IR) 25-100 MG tablet, 1 tab at 8am, 1 tab at 2pm, 1.5 tabs at 8pm (Patient taking differently: Take 1-1.5 tablets by mouth 3 (three) times daily. 1 tab at 8am, 1 tab at 2pm, 1.5 tabs at 8pm), Disp: 120 tablet, Rfl: 12   cholecalciferol (VITAMIN D3) 25 MCG (1000 UNIT) tablet, Take 3,000 Units by mouth daily., Disp: , Rfl:    diclofenac Sodium (VOLTAREN) 1 % GEL, Apply 2 g topically daily as needed (pain). Lower back pain, Disp: , Rfl:    divalproex  (DEPAKOTE ) 250 MG DR tablet, Take 250 mg by mouth 3 (three) times daily., Disp: , Rfl:    docusate sodium  (COLACE) 100 MG capsule, Take 100 mg by mouth 2 (two) times daily., Disp: , Rfl:    gabapentin  (NEURONTIN ) 100 MG capsule, Take 200 mg by mouth at bedtime., Disp: , Rfl:    glycopyrrolate  (ROBINUL ) 1 MG tablet, Take 1 tablet (1 mg total) by mouth 2 (two) times daily., Disp: 60 tablet, Rfl: 5   Lactobacillus (PROBIOTIC ACIDOPHILUS PO), Take 1 capsule by mouth daily., Disp: , Rfl:    magnesium hydroxide (MILK OF MAGNESIA) 400 MG/5ML suspension, Take 30 mLs by mouth 2 (two) times daily as needed for mild constipation., Disp: , Rfl:    Multiple Vitamin (MULTIVITAMIN WITH MINERALS) TABS tablet, Take 1 tablet by mouth daily., Disp: , Rfl:    polyethylene glycol (MIRALAX / GLYCOLAX) 17 g packet, Take 17 g by mouth daily., Disp: , Rfl:    risperiDONE  (RISPERDAL ) 0.5 MG tablet, Take 0.5 mg by mouth at bedtime. Along with the 2 mg tablet to equal 2.5 mg QHS, Disp: , Rfl:    risperiDONE  (RISPERDAL ) 2 MG tablet, Take 2 mg by mouth 2 (two) times daily., Disp: , Rfl:    rosuvastatin  (CRESTOR ) 5 MG tablet, Take 0.5 tablets (2.5 mg total) by mouth every other day. Take 1/2 tablet on Monday, Wednesday and Friday, Disp: 45 tablet, Rfl: 3   sertraline (ZOLOFT) 100 MG tablet, Take 100 mg by mouth daily., Disp: , Rfl:    silodosin  (RAPAFLO ) 8 MG CAPS  capsule, Take 1 capsule (8 mg total) by mouth daily with breakfast., Disp: 30 capsule, Rfl: 11  Vitals   There were no vitals filed for this visit.  There  is no height or weight on file to calculate BMI.   Physical Exam  General: Awake alert in no distress HEENT: Mild bruising on the right forehead CVS: Regular rate rhythm Abdomen nondistended nontender Neurological exam Awake alert oriented x 3 Severely dysarthric speech with stuttering. No gross aphasia Cranial nerves: Pupils equal round react light, extraocular movements intact, visual fields appear intact, masked facies with at rest left lower lip appearing lower than the right side.  Symmetric nasolabial folds and symmetric smile.  Tongue and palate midline. Motor examination reveals no drift in any of the 4 extremities.  There is increased tone in all 4 extremities.  There is resting tremor worse on the left than on the right. Sensation is intact to light touch Coordination examination reveals no gross dysmetria-increased tone and resting tremor noted.   Labs/Imaging/Neurodiagnostic studies   CBC: No results for input(s): WBC, NEUTROABS, HGB, HCT, MCV, PLT in the last 168 hours. Basic Metabolic Panel:  Lab Results  Component Value Date   NA 139 10/05/2023   K 3.6 10/05/2023   CO2 27 10/05/2023   GLUCOSE 92 10/05/2023   BUN 33 (H) 10/05/2023   CREATININE 1.64 (H) 10/05/2023   CALCIUM  8.3 (L) 10/05/2023   GFRNONAA 47 (L) 10/05/2023   GFRAA 105 11/28/2017   Lipid Panel:  Lab Results  Component Value Date   LDLCALC 72 07/28/2020   HgbA1c:  Lab Results  Component Value Date   HGBA1C 4.8 11/08/2013    CT Head without contrast(Personally reviewed): No bleed.  Aspects 10.  CT angio Head and Neck with contrast(Personally reviewed): Not emergently performed-has history of bradycardia and renal function and exam did not look consistent with LVO as well as the fact that he has poor baseline modified Rankin  score precluding EVT.  ASSESSMENT   LATRON RIBAS is a 63 y.o. male past history of Parkinson's disease, dementia, schizophrenia, presenting for evaluation of left facial droop which was noticed after syncopal episode. Noted to have hypotension with systolic blood pressure in the 80s after he syncopized getting off the toilet.  Then noted to have left lower facial weakness. Symptoms too mild to treat at this time, even if this were a true stroke. Given the fact that he was hypotensive at the time, this might be related to transient hypoperfusion.  Small stroke is also a possibility.  Autonomic dysregulation in the setting of Parkinson's likely to be the culprit of this.  Impression: Left lower facial weakness in the setting of hypotension and syncope-evaluate for stroke.  RECOMMENDATIONS  At this time, I recommend obtaining an MRI of the brain without contrast. If that shows a stroke, he will need admission for inpatient stroke risk factor workup. If the MRI is negative, I would recommend obtaining urinalysis, chest x-ray to evaluate for underlying infection that might of caused transient hypotension and cerebral hypoperfusion. Plan discussed with EDP Dr. Ula   Addendum MRI negative for acute stroke Possible sinusitis No further inpatient neurological workup at this time Plan discussed with Dr. TEE ______________________________________________________________________    Signed, Eligio Lav, MD [facility]

## 2023-12-30 ENCOUNTER — Encounter: Payer: Self-pay | Admitting: Urology

## 2023-12-30 ENCOUNTER — Ambulatory Visit: Admitting: Urology

## 2023-12-30 DIAGNOSIS — N3941 Urge incontinence: Secondary | ICD-10-CM | POA: Diagnosis not present

## 2023-12-30 DIAGNOSIS — N401 Enlarged prostate with lower urinary tract symptoms: Secondary | ICD-10-CM | POA: Diagnosis not present

## 2023-12-30 DIAGNOSIS — R339 Retention of urine, unspecified: Secondary | ICD-10-CM

## 2023-12-30 DIAGNOSIS — R338 Other retention of urine: Secondary | ICD-10-CM | POA: Diagnosis not present

## 2023-12-30 DIAGNOSIS — N138 Other obstructive and reflux uropathy: Secondary | ICD-10-CM

## 2023-12-30 LAB — URINALYSIS, ROUTINE W REFLEX MICROSCOPIC
Bilirubin, UA: NEGATIVE
Glucose, UA: NEGATIVE
Ketones, UA: NEGATIVE
Leukocytes,UA: NEGATIVE
Nitrite, UA: NEGATIVE
Protein,UA: NEGATIVE
RBC, UA: NEGATIVE
Specific Gravity, UA: 1.015 (ref 1.005–1.030)
Urobilinogen, Ur: 1 mg/dL (ref 0.2–1.0)
pH, UA: 7 (ref 5.0–7.5)

## 2023-12-30 MED ORDER — SILODOSIN 8 MG PO CAPS: 8.0000 mg | ORAL_CAPSULE | Freq: Every day | ORAL | 11 refills | Status: DC

## 2023-12-30 MED ORDER — MIRABEGRON ER 25 MG PO TB24: 25.0000 mg | ORAL_TABLET | Freq: Every day | ORAL | 11 refills | Status: AC

## 2023-12-30 NOTE — Patient Instructions (Signed)

## 2023-12-30 NOTE — Progress Notes (Addendum)
 12/30/2023 1:02 PM   Mark Benjamin [id number] [id number]  Referring provider: Pia Kerney SQUIBB, MD [address] [place],  [place] [address]  Incomplete emptying   HPI: Mr Karapetian is a 63yo here for followup for BPH with incomplete emptying. PVR 290cc. He remains on rapaflo  8mg  daily. He has nocturia 2-5x per night. He has urge incontinence and uses 6 pads per day. He has advanced Parkinsons disease.    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    Other and unspecified hyperlipidemia    Parkinson disease (HCC)    Schizophrenia (HCC)     Surgical History: Past Surgical History:  Procedure Laterality Date   COLONOSCOPY  02/13/08   COLONOSCOPY N/A 07/10/2012   Procedure: COLONOSCOPY;  Surgeon: Claudis RAYMOND Rivet, MD;  Location: [facility];  Service: Endoscopy;  Laterality: N/A;  225   CYSTOSCOPY WITH INSERTION OF UROLIFT N/A 10/24/2017   Procedure: CYSTOSCOPY WITH INSERTION OF UROLIFT;  Surgeon: Sherrilee Belvie CROME, MD;  Location: [facility];  Service: Urology;  Laterality: N/A;   UPPER GASTROINTESTINAL ENDOSCOPY  EGD ED   09/09/2010   URETHRAL DILATION  1965    Home Medications:  Allergies as of 12/30/2023       Reactions   Erythromycin Other (See Comments)   Unknown    Penicillins Other (See Comments)   Unknown    Sulfamethoxazole -trimethoprim  Other (See Comments)   Extreme weakness/lethargy   Zocor [simvastatin] Other (See Comments)   Unknown    Amoxicillin Other (See Comments)   Dizzy and blurred vision        Medication List        Accurate as of December 30, 2023  1:02 PM. If you have any questions, ask your nurse or doctor.          acetaminophen  325 MG tablet Commonly known as: TYLENOL  Take 650 mg by mouth every 6 (six) hours as needed for mild pain (pain score 1-3).    anti-nausea solution Take 30 mLs by mouth every 15 (fifteen) minutes as needed for nausea or vomiting. For 4 doses in 1 hour.   benztropine 1 MG tablet Commonly known as: COGENTIN Take 1 mg by mouth 2 (two) times daily.   carbidopa -levodopa  25-100 MG tablet Commonly known as: SINEMET  IR 1 tab at 8am, 1 tab at 2pm, 1.5 tabs at 8pm What changed:  how much to take how to take this when to take this additional instructions   cholecalciferol 25 MCG (1000 UNIT) tablet Commonly known as: VITAMIN D3 Take 3,000 Units by mouth daily.   diclofenac Sodium 1 % Gel Commonly known as: VOLTAREN Apply 2 g topically daily as needed (pain). Lower back pain   divalproex  250 MG DR tablet Commonly known as: DEPAKOTE  Take 250 mg by mouth 3 (three) times daily.   docusate sodium  100 MG capsule Commonly known as: COLACE Take 100 mg by mouth 2 (two) times daily.   famotidine 20 MG tablet Commonly known as: PEPCID Take 20 mg by mouth 2 (two) times daily.   gabapentin  100 MG capsule Commonly known as: NEURONTIN  Take 200 mg by mouth at bedtime.   glycopyrrolate  1 MG tablet Commonly known as: ROBINUL  Take 1 tablet (1 mg total) by mouth 2 (two)  times daily.   magnesium hydroxide 400 MG/5ML suspension Commonly known as: MILK OF MAGNESIA Take 30 mLs by mouth 2 (two) times daily as needed for mild constipation.   multivitamin with minerals Tabs tablet Take 1 tablet by mouth daily.   polyethylene glycol 17 g packet Commonly known as: MIRALAX / GLYCOLAX Take 17 g by mouth daily.   PROBIOTIC ACIDOPHILUS PO Take 1 capsule by mouth daily.   risperiDONE  2 MG tablet Commonly known as: RISPERDAL  Take 2 mg by mouth in the morning.   risperiDONE  1 MG tablet Commonly known as: RISPERDAL  Take 2.5 mg by mouth at bedtime. Along with the 2 mg tablet to equal 2.5 mg QHS   rosuvastatin  5 MG tablet Commonly known as: Crestor  Take 0.5 tablets (2.5 mg total) by mouth every other day. Take 1/2 tablet  on Monday, Wednesday and Friday   sertraline 100 MG tablet Commonly known as: ZOLOFT Take 100 mg by mouth daily.   silodosin  8 MG Caps capsule Commonly known as: RAPAFLO  Take 1 capsule (8 mg total) by mouth daily with breakfast.        Allergies:  Allergies  Allergen Reactions   Erythromycin Other (See Comments)    Unknown    Penicillins Other (See Comments)    Unknown    Sulfamethoxazole -Trimethoprim  Other (See Comments)    Extreme weakness/lethargy   Zocor [Simvastatin] Other (See Comments)    Unknown    Amoxicillin Other (See Comments)    Dizzy and blurred vision    Family History: Family History  Problem Relation Age of Onset   Heart failure Mother    ALS Father    Hyperlipidemia Sister    Inflammatory bowel disease Paternal Grandfather     Social History:  reports that he has never smoked. He has never used smokeless tobacco. He reports that he does not drink alcohol  and does not use drugs.  ROS: All other review of systems were reviewed and are negative except what is noted above in HPI  Physical Exam: There were no vitals taken for this visit.  Constitutional:  Alert and oriented, No acute distress. HEENT: [place] AT, moist mucus membranes.  Trachea midline, no masses. Cardiovascular: No clubbing, cyanosis, or edema. Respiratory: Normal respiratory effort, no increased work of breathing. GI: Abdomen is soft, nontender, nondistended, no abdominal masses GU: No CVA tenderness.  Lymph: No cervical or inguinal lymphadenopathy. Skin: No rashes, bruises or suspicious lesions. Neurologic: Grossly intact, no focal deficits, moving all 4 extremities. Psychiatric: Normal mood and affect.  Laboratory Data: Lab Results  Component Value Date   WBC 9.7 12/05/2023   HGB 11.6 (L) 12/05/2023   HCT 34.0 (L) 12/05/2023   MCV 100.8 (H) 12/05/2023   PLT 224 12/05/2023    Lab Results  Component Value Date   CREATININE 0.80 12/05/2023    Lab Results  Component Value  Date   PSA 0.80 04/24/2013   PSA 0.7 10/30/2012   PSA 0.73 06/22/2012    No results found for: TESTOSTERONE  Lab Results  Component Value Date   HGBA1C 4.8 11/08/2013    Urinalysis    Component Value Date/Time   COLORURINE YELLOW 12/05/2023 1307   APPEARANCEUR CLEAR 12/05/2023 1307   APPEARANCEUR Clear 09/08/2022 0924   LABSPEC 1.006 12/05/2023 1307   PHURINE 7.0 12/05/2023 1307   GLUCOSEU NEGATIVE 12/05/2023 1307   HGBUR NEGATIVE 12/05/2023 1307   BILIRUBINUR NEGATIVE 12/05/2023 1307   BILIRUBINUR Negative 09/08/2022 0924   KETONESUR NEGATIVE 12/05/2023 1307  PROTEINUR NEGATIVE 12/05/2023 1307   UROBILINOGEN negative 03/19/2015 1138   NITRITE NEGATIVE 12/05/2023 1307   LEUKOCYTESUR NEGATIVE 12/05/2023 1307    Lab Results  Component Value Date   LABMICR Comment 09/08/2022   WBCUA None seen 12/05/2019   RBCUA None seen 11/28/2017   LABEPIT None seen 12/05/2019   MUCUS occ 03/19/2015   BACTERIA None seen 12/05/2019    Pertinent Imaging:  No results found for this or any previous visit.  No results found for this or any previous visit.  No results found for this or any previous visit.  No results found for this or any previous visit.  No results found for this or any previous visit.  No results found for this or any previous visit.  No results found for this or any previous visit.  No results found for this or any previous visit.   Assessment & Plan:    1. Incomplete emptying of bladder (Primary) Continue rapaflo  8mg  daily - Urinalysis, Routine w reflex microscopic - BLADDER SCAN AMB NON-IMAGING  2. Benign prostatic hyperplasia with urinary obstruction Rapaflo  8mg  daily - Urinalysis, Routine w reflex microscopic - BLADDER SCAN AMB NON-IMAGING  3. Urge incontinence -we will start mirabegron 25mg  daily.    No follow-ups on file.  Belvie Clara, MD  [facility] [place]

## 2023-12-30 NOTE — Progress Notes (Signed)
 Bladder Scan completed today due to reason incomplete bladder emptying  Patient can void prior to the bladder scan. Bladder scan result: 290  Performed By: [facility] LPN

## 2023-12-30 NOTE — Addendum Note (Signed)
 Addended by: Corbyn Steedman L on: 12/30/2023 01:20 PM   Modules accepted: Orders

## 2024-01-02 ENCOUNTER — Telehealth: Payer: Self-pay

## 2024-01-02 DIAGNOSIS — R339 Retention of urine, unspecified: Secondary | ICD-10-CM

## 2024-01-02 MED ORDER — TAMSULOSIN HCL 0.4 MG PO CAPS: 0.4000 mg | ORAL_CAPSULE | Freq: Every day | ORAL | 3 refills | Status: AC

## 2024-01-02 NOTE — Telephone Encounter (Signed)
 Called [facility]- [facility]. Verbal from  MD, McKenzie for pt to go back on Mirabegron 25 mg daily. D/C Rapaflo  8 mg and replace with Flomax  0.4 mg daily. Charmaine voiced understanding

## 2024-01-03 LAB — BLADDER SCAN AMB NON-IMAGING: Scan Result: 290

## 2024-01-06 ENCOUNTER — Encounter: Payer: Self-pay | Admitting: Diagnostic Neuroimaging

## 2024-03-06 ENCOUNTER — Ambulatory Visit: Admitting: Diagnostic Neuroimaging

## 2024-06-29 ENCOUNTER — Ambulatory Visit: Admitting: Urology
